# Patient Record
Sex: Female | Born: 1960
Health system: Southern US, Community
[De-identification: ages and names within clinical notes are randomized; demographics above are authoritative.]

## PROBLEM LIST (undated history)

## (undated) DIAGNOSIS — Z923 Personal history of irradiation: Secondary | ICD-10-CM

## (undated) DIAGNOSIS — K219 Gastro-esophageal reflux disease without esophagitis: Secondary | ICD-10-CM

## (undated) DIAGNOSIS — D649 Anemia, unspecified: Secondary | ICD-10-CM

## (undated) DIAGNOSIS — C801 Malignant (primary) neoplasm, unspecified: Secondary | ICD-10-CM

## (undated) DIAGNOSIS — Z9221 Personal history of antineoplastic chemotherapy: Secondary | ICD-10-CM

## (undated) DIAGNOSIS — F419 Anxiety disorder, unspecified: Secondary | ICD-10-CM

## (undated) DIAGNOSIS — I10 Essential (primary) hypertension: Secondary | ICD-10-CM

## (undated) DIAGNOSIS — C50919 Malignant neoplasm of unspecified site of unspecified female breast: Secondary | ICD-10-CM

## (undated) DIAGNOSIS — E119 Type 2 diabetes mellitus without complications: Secondary | ICD-10-CM

## (undated) HISTORY — PX: OTHER SURGICAL HISTORY: SHX169

---
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
## 2003-12-01 ENCOUNTER — Ambulatory Visit: Payer: Self-pay | Admitting: Internal Medicine

## 2004-04-09 ENCOUNTER — Ambulatory Visit: Payer: Self-pay | Admitting: Internal Medicine

## 2004-08-29 ENCOUNTER — Ambulatory Visit: Payer: Self-pay | Admitting: Internal Medicine

## 2004-09-03 ENCOUNTER — Ambulatory Visit: Payer: Self-pay | Admitting: Internal Medicine

## 2004-09-05 ENCOUNTER — Ambulatory Visit: Payer: Self-pay

## 2004-11-21 ENCOUNTER — Ambulatory Visit: Payer: Self-pay | Admitting: Internal Medicine

## 2006-10-10 ENCOUNTER — Inpatient Hospital Stay (HOSPITAL_COMMUNITY): Admission: AD | Admit: 2006-10-10 | Discharge: 2006-10-10 | Payer: Self-pay | Admitting: Obstetrics and Gynecology

## 2006-12-09 ENCOUNTER — Encounter (INDEPENDENT_AMBULATORY_CARE_PROVIDER_SITE_OTHER): Payer: Self-pay | Admitting: Obstetrics and Gynecology

## 2006-12-09 ENCOUNTER — Ambulatory Visit (HOSPITAL_COMMUNITY): Admission: RE | Admit: 2006-12-09 | Discharge: 2006-12-09 | Payer: Self-pay | Admitting: Obstetrics and Gynecology

## 2007-04-06 ENCOUNTER — Ambulatory Visit: Payer: Self-pay | Admitting: Internal Medicine

## 2007-04-06 ENCOUNTER — Encounter (INDEPENDENT_AMBULATORY_CARE_PROVIDER_SITE_OTHER): Payer: Self-pay | Admitting: *Deleted

## 2007-04-06 DIAGNOSIS — I10 Essential (primary) hypertension: Secondary | ICD-10-CM | POA: Insufficient documentation

## 2007-04-06 DIAGNOSIS — L910 Hypertrophic scar: Secondary | ICD-10-CM | POA: Insufficient documentation

## 2007-04-06 DIAGNOSIS — F411 Generalized anxiety disorder: Secondary | ICD-10-CM | POA: Insufficient documentation

## 2009-06-16 ENCOUNTER — Ambulatory Visit: Payer: Self-pay | Admitting: Internal Medicine

## 2009-06-16 ENCOUNTER — Encounter (INDEPENDENT_AMBULATORY_CARE_PROVIDER_SITE_OTHER): Payer: Self-pay | Admitting: *Deleted

## 2009-06-16 DIAGNOSIS — R51 Headache: Secondary | ICD-10-CM | POA: Insufficient documentation

## 2009-06-16 DIAGNOSIS — J019 Acute sinusitis, unspecified: Secondary | ICD-10-CM | POA: Insufficient documentation

## 2009-06-16 DIAGNOSIS — R519 Headache, unspecified: Secondary | ICD-10-CM | POA: Insufficient documentation

## 2009-08-03 ENCOUNTER — Emergency Department (HOSPITAL_COMMUNITY): Admission: EM | Admit: 2009-08-03 | Discharge: 2009-08-03 | Payer: Self-pay | Admitting: Emergency Medicine

## 2009-08-06 ENCOUNTER — Emergency Department (HOSPITAL_COMMUNITY): Admission: EM | Admit: 2009-08-06 | Discharge: 2009-08-06 | Payer: Self-pay | Admitting: Emergency Medicine

## 2010-02-13 NOTE — Letter (Signed)
Summary: Out of Work  [facility]  [address] [place], [place] [address]   Phone: [phone]  Fax: [phone]    June 16, 2009   Employee:  SHAMEKA AGGARWAL    To Whom It May Concern:   For Medical reasons, please excuse the above named employee from work for the following dates:  Start:   06/17/2009  End:   06/19/2009  Return to work 06/20/2009  If you need additional information, please feel free to contact our office.         Sincerely,    Dr. Oliver Barre

## 2010-02-13 NOTE — Assessment & Plan Note (Signed)
Summary: sore throat,difficulty swallowing/headache/ear pain/lb   Vital Signs:  Patient profile:   50 year old female Height:      61 inches Weight:      157.75 pounds BMI:     29.91 O2 Sat:      98 % on Room air Temp:     98.2 degrees F oral Pulse rate:   61 / minute BP sitting:   122 / 90  (left arm) Cuff size:   regular  Vitals Entered ByZella Ball Ewing (June 16, 2009 10:52 AM)  O2 Flow:  Room air CC: congestion, sore throat, ear pain, headache/RE   CC:  congestion, sore throat, ear pain, and headache/RE.  History of Present Illness: here with acute onset x 4 daysmild to mod headache, fever, ST, facial pain, pressure , and greenish congesiton, now with worsening prod cough as well today with greenish sputum, but Pt denies CP, sob, doe, wheezing, orthopnea, pnd, worsening LE edema, palps, dizziness or syncope   Hard to swallow secretions per pt,  got choked last PM, almost went to the ER,  Vick 44 and Dayquil not helping.    Problems Prior to Update: 1)  Headache  (ICD-784.0) 2)  Bronchitis-acute  (ICD-466.0) 3)  Sinusitis- Acute-nos  (ICD-461.9) 4)  Keloid  (ICD-701.4) 5)  Hypertension  (ICD-401.9) 6)  Anxiety  (ICD-300.00)  Medications Prior to Update: 1)  Azithromycin 250 Mg  Tabs (Azithromycin) .... 2po Qd For 1 Day, Then 1po Qd For 4days, Then Stop 2)  Tussionex Pennkinetic Er 8-10 Mg/46ml  Lqcr (Chlorpheniramine-Hydrocodone) .Marland Kitchen.. 1 Tsp By Mouth Two Times A Day Prn  Current Medications (verified): 1)  Cephalexin 500 Mg Caps (Cephalexin) .Marland Kitchen.. 1 Po Three Times A Day 2)  Hydrocodone-Homatropine 5-1.5 Mg/30ml Syrp (Hydrocodone-Homatropine) .Marland Kitchen.. 1 Tsp By Mouth Q 6 Hrs As Needed Cough 3)  Ibuprofen 400 Mg Tabs (Ibuprofen) .Marland Kitchen.. 1 - 2 By Mouth Q 6 Hrs As Needed  Allergies (verified): 1)  ! Atenolol  Past History:  Past Medical History: Last updated: 04/06/2007 migraine Anxiety Hypertension - diet controlled per pt  Past Surgical History: Last updated: 04/06/2007 nov  2008 - hysteroscopy c-section x 1  Social History: Last updated: 04/06/2007 Never Smoked Alcohol use-no single 2 children work - Investment banker, operational - [employer]  Risk Factors: Smoking Status: never (04/06/2007)  Review of Systems       all otherwise negative per pt -    Physical Exam  General:  alert and overweight-appearing.  , mild ill  Head:  normocephalic and atraumatic.   Eyes:  vision grossly intact, pupils equal, and pupils round.   Ears:  bilat tm's mild red, sinus tender bilat  Nose:  nasal dischargemucosal pallor and mucosal edema.   Mouth:  pharyngeal erythema and fair dentition.  , no exudate Neck:  supple and cervical lymphadenopathy.   Lungs:  normal respiratory effort and normal breath sounds., no wheeze or rales   Heart:  normal rate and regular rhythm.   Extremities:  no edema, no erythema  Neurologic:  alert & oriented X3, cranial nerves II-XII intact, strength normal in all extremities, and gait normal.     Impression & Recommendations:  Problem # 1:  BRONCHITIS-ACUTE (ICD-466.0)  Her updated medication list for this problem includes:    Cephalexin 500 Mg Caps (Cephalexin) .Marland Kitchen... 1 po three times a day    Hydrocodone-homatropine 5-1.5 Mg/63ml Syrp (Hydrocodone-homatropine) .Marland Kitchen... 1 tsp by mouth q 6 hrs as needed cough treat as above, f/u any  worsening signs or symptoms , also gave work note  Problem # 2:  SINUSITIS- ACUTE-NOS (ICD-461.9)  Her updated medication list for this problem includes:    Cephalexin 500 Mg Caps (Cephalexin) .Marland Kitchen... 1 po three times a day    Hydrocodone-homatropine 5-1.5 Mg/33ml Syrp (Hydrocodone-homatropine) .Marland Kitchen... 1 tsp by mouth q 6 hrs as needed cough treat as above, f/u any worsening signs or symptoms   Problem # 3:  HYPERTENSION (ICD-401.9)  BP today: 122/90 Prior BP: 147/77 (04/06/2007) stable overall by hx and exam, ok to continue meds/tx as is - does not need med at this time  Problem # 4:  HEADACHE (ICD-784.0)  Her  updated medication list for this problem includes:    Ibuprofen 400 Mg Tabs (Ibuprofen) .Marland Kitchen... 1 - 2 by mouth q 6 hrs as needed likely related to above - treat as above, f/u any worsening signs or symptoms , exam o/w benign  Complete Medication List: 1)  Cephalexin 500 Mg Caps (Cephalexin) .Marland Kitchen.. 1 po three times a day 2)  Hydrocodone-homatropine 5-1.5 Mg/14ml Syrp (Hydrocodone-homatropine) .Marland Kitchen.. 1 tsp by mouth q 6 hrs as needed cough 3)  Ibuprofen 400 Mg Tabs (Ibuprofen) .Marland Kitchen.. 1 - 2 by mouth q 6 hrs as needed  Patient Instructions: 1)  Please take all new medications as prescribed 2)  Continue all previous medications as before this visit  3)  you are given the work note today 4)  Please schedule a follow-up appointment in 3 months with CPX labs Prescriptions: IBUPROFEN 400 MG TABS (IBUPROFEN) 1 - 2 by mouth q 6 hrs as needed  #60 x 1   Entered and Authorized by:   Corwin Levins MD   Signed by:   Corwin Levins MD on 06/16/2009   Method used:   Print then Give to Patient   RxID:   [id number] HYDROCODONE-HOMATROPINE 5-1.5 MG/5ML SYRP (HYDROCODONE-HOMATROPINE) 1 tsp by mouth q 6 hrs as needed cough  #6oz x 1   Entered and Authorized by:   Corwin Levins MD   Signed by:   Corwin Levins MD on 06/16/2009   Method used:   Print then Give to Patient   RxID:   [id number] CEPHALEXIN 500 MG CAPS (CEPHALEXIN) 1 po three times a day  #30 x 0   Entered and Authorized by:   Corwin Levins MD   Signed by:   Corwin Levins MD on 06/16/2009   Method used:   Print then Give to Patient   RxID:   [id number]

## 2010-03-31 LAB — COMPREHENSIVE METABOLIC PANEL
ALT: 32 U/L (ref 0–35)
AST: 34 U/L (ref 0–37)
Alkaline Phosphatase: 98 U/L (ref 39–117)
Calcium: 9.1 mg/dL (ref 8.4–10.5)
Creatinine, Ser: 0.76 mg/dL (ref 0.4–1.2)
GFR calc non Af Amer: >60 mL/min (ref >60)
Sodium: 138 mEq/L (ref 135–145)
Total Bilirubin: 0.4 mg/dL (ref 0.3–1.2)

## 2010-03-31 LAB — CBC
HCT: 37.8 % (ref 36.0–46.0)
RBC: 5.11 MIL/uL (ref 3.87–5.11)

## 2010-03-31 LAB — URINALYSIS, ROUTINE W REFLEX MICROSCOPIC
Glucose, UA: NEGATIVE mg/dL
Hgb urine dipstick: NEGATIVE
Nitrite: NEGATIVE
Protein, ur: NEGATIVE mg/dL
Specific Gravity, Urine: 1.022 (ref 1.005–1.030)
pH: 5.5 (ref 5.0–8.0)

## 2010-03-31 LAB — DIFFERENTIAL
Basophils Absolute: 0.1 10*3/uL (ref 0.0–0.1)
Basophils Relative: 0 % (ref 0–1)
Eosinophils Absolute: 0 10*3/uL (ref 0.0–0.7)
Lymphocytes Relative: 13 % (ref 12–46)
Monocytes Absolute: 0.5 10*3/uL (ref 0.1–1.0)
Neutro Abs: 13.5 10*3/uL — ABNORMAL HIGH (ref 1.7–7.7)

## 2010-03-31 LAB — URINE CULTURE: Colony Count: 50000

## 2010-03-31 LAB — POCT PREGNANCY, URINE: Preg Test, Ur: NEGATIVE

## 2010-03-31 LAB — LIPASE, BLOOD: Lipase: 26 U/L (ref 11–59)

## 2010-03-31 LAB — URINE MICROSCOPIC-ADD ON

## 2010-05-29 NOTE — H&P (Signed)
[name], [name] NO.:  [id number]   MEDICAL RECORD NO.:  [id number]          PATIENT TYPE:  AMB   LOCATION:  SDC                           FACILITY:  [facility]   PHYSICIAN:  Juluis Mire, M.D.   DATE OF BIRTH:  [birth year]   DATE OF ADMISSION:  12/09/2006  DATE OF DISCHARGE:                              HISTORY & PHYSICAL   HISTORY OF PRESENT ILLNESS:  The patient is a 50 year old, gravida 3  para 2 abortus 1, female who presents for hysteroscopy.   The patient's cycles have become markedly regular and heavy with  associated anemia.  She was seen at [facility] Emergency Room where  the ultrasound confirmed multiple fibroids and a simple cyst in the left  ovary.  She subsequently underwent a saline infusion ultrasound inside  the office.  There was a large endometrial polyp for which she now  presents for resection.  It did have some cystic structures.  We could  not exclude some kind of endometrial pathology such as hyperplasia.   ALLERGIES:  NO KNOWN DRUG ALLERGIES.   MEDICATIONS:  Vitamins and iron.   PAST MEDICAL HISTORY:  Usual childhood diseases.  No significant  sequelae.   PAST SURGICAL HISTORY:  No previous surgical history.   OBSTETRICAL HISTORY:  She has had two vaginal deliveries.   SOCIAL HISTORY:  No tobacco or alcohol use.   FAMILY HISTORY:  Noncontributory.   REVIEW OF SYSTEMS:  Noncontributory.   PHYSICAL EXAMINATION:  VITAL SIGNS:  The patient is afebrile, stable  vital signs.  HEENT: The patient is normocephalic.  Pupils equal, round, and reactive  to light and accommodation.  Extraocular movements were intact.  Sclerae  and conjunctivae were clear.  Oropharynx clear.  NECK:  Without thyromegaly.  BREASTS:  Not examined.  LUNGS:  Clear.  CARDIOVASCULAR SYSTEM:  Regular rate.  No murmurs or gallops.  ABDOMEN:  Her abdominal exam is benign.  No mass, organomegaly, or  tenderness.  PELVIC:  Normal external genitalia.  Vaginal  mucosa is clear.  Cervix  unremarkable.  Uterus is 12 weeks in size, consistent with fibroids.  Adnexa unremarkable.  EXTREMITIES:  Trace edema.  NEUROLOGIC:  Grossly normal limits.   IMPRESSION:  1. Uterine fibroids.  2. Endometrial polyp.  3. Menorrhagia with anemia.   PLAN:  The patient will undergo hysteroscopy with D&C.  The risks have  been discussed including the risk of infection.  The risk of hemorrhage  could require transfusion with the risk of AIDS or hepatitis or possible  hysterectomy.  The risk of perforation that could lead to injury to  organs nearby such as bowel requiring further exploratory surgery.  The  risk of deep venous thrombosis and pulmonary embolus.  The patient  expressed understanding of the indications and risks.      Juluis Mire, M.D.  Electronically Signed     JSM/MEDQ  D:  12/09/2006  T:  12/09/2006  Job:  [id number]

## 2010-05-29 NOTE — Op Note (Signed)
NAMEMYLES, Erin Lawson NO.:  [id number]   MEDICAL RECORD NO.:  [id number]          PATIENT TYPE:  AMB   LOCATION:  SDC                           FACILITY:  [facility]   PHYSICIAN:  Juluis Mire, M.D.   DATE OF BIRTH:  1960/01/29   DATE OF PROCEDURE:  12/09/2006  DATE OF DISCHARGE:                               OPERATIVE REPORT   PREOPERATIVE DIAGNOSES:  1. Uterine fibroids, endometrial polyp, associated menorrhagia.  2. Lesion on the inner left thigh, probable lipoma.   POSTOPERATIVE DIAGNOSES:  1. Uterine fibroids, endometrial polyp, associated menorrhagia.  2. Lesion on the inner left thigh, probable lipoma.   PROCEDURE:  Hysteroscopy, resection of endometrium, endometrial  curettings, excision of lesion from left inner thigh.   SURGEON:  Juluis Mire, M.D.   ANESTHESIA:  General.   ESTIMATED BLOOD LOSS:  Minimal.   PACKS AND DRAINS:  None.   INTRAOPERATIVE BLOOD REPLACED:  None.   COMPLICATIONS:  None.   INDICATIONS:  Noted in the history and physical.   PROCEDURE:  The patient was taken to OR, placed in supine position.  After a satisfactory level of general anesthesia was obtained, the  patient was placed in dorsal lithotomy position using Allen stirrups.  Perineum, vagina were prepped out with Betadine and draped as a sterile  field. Speculum was placed in the vaginal vault.  The cervix grasped  with single-tooth tenaculum.  Uterus sounded to approximately 8 cm.  Cervix serially dilated to a size 35 Pratt dilator.  Operative  hysteroscope was introduced into uterine cavity which was distended  using sorbitol.  Visualization really did not reveal a polyp.  It may  been disrupted from the dilation.  There were fragments of tissue.  Multiple endometrial samplings were obtained along with curettings.  There was no signs of any type of perforation and deficit was minimal.  The hysteroscope was then removed.  Endometrial curettings were  obtained.  The  single toothed tenaculum and speculum were removed.   There was a lipoma on the inner aspect of the left thigh.  This was  infiltrated with Marcaine and resected and sent to pathology.  Skin was  reapproximated with 3-0 Rapide.  At this point in time the patient was  taken out of dorsal lithotomy position.  Once alert and extubated,  transferred to recovery room in good condition.  Sponge, instrument,  needle count was reported correct by circulating nurse.      Juluis Mire, M.D.  Electronically Signed     JSM/MEDQ  D:  12/09/2006  T:  12/09/2006  Job:  [id number]

## 2010-10-23 LAB — CBC
HCT: 35.3 — ABNORMAL LOW
MCHC: 32.9
Platelets: 408 — ABNORMAL HIGH
RDW: 14.3
WBC: 9.7

## 2010-10-25 LAB — WET PREP, GENITAL: Clue Cells Wet Prep HPF POC: NONE SEEN

## 2010-10-25 LAB — CBC
Hemoglobin: 10.9 — ABNORMAL LOW
RBC: 4.61
WBC: 9.3

## 2010-10-25 LAB — GC/CHLAMYDIA PROBE AMP, GENITAL: Chlamydia, DNA Probe: NEGATIVE

## 2012-05-17 ENCOUNTER — Observation Stay (HOSPITAL_COMMUNITY)
Admission: EM | Admit: 2012-05-17 | Discharge: 2012-05-18 | Disposition: A | Discharge status: Home / Self Care | Payer: BC Managed Care – PPO | Attending: Internal Medicine | Admitting: Internal Medicine

## 2012-05-17 ENCOUNTER — Emergency Department (HOSPITAL_COMMUNITY): Payer: BC Managed Care – PPO

## 2012-05-17 ENCOUNTER — Encounter (HOSPITAL_COMMUNITY): Payer: Self-pay | Admitting: *Deleted

## 2012-05-17 DIAGNOSIS — L910 Hypertrophic scar: Secondary | ICD-10-CM

## 2012-05-17 DIAGNOSIS — D649 Anemia, unspecified: Secondary | ICD-10-CM

## 2012-05-17 DIAGNOSIS — R51 Headache: Secondary | ICD-10-CM

## 2012-05-17 DIAGNOSIS — E119 Type 2 diabetes mellitus without complications: Secondary | ICD-10-CM

## 2012-05-17 DIAGNOSIS — R079 Chest pain, unspecified: Principal | ICD-10-CM

## 2012-05-17 DIAGNOSIS — I10 Essential (primary) hypertension: Secondary | ICD-10-CM

## 2012-05-17 DIAGNOSIS — E669 Obesity, unspecified: Secondary | ICD-10-CM | POA: Insufficient documentation

## 2012-05-17 DIAGNOSIS — D509 Iron deficiency anemia, unspecified: Secondary | ICD-10-CM | POA: Insufficient documentation

## 2012-05-17 DIAGNOSIS — J019 Acute sinusitis, unspecified: Secondary | ICD-10-CM

## 2012-05-17 DIAGNOSIS — F411 Generalized anxiety disorder: Secondary | ICD-10-CM

## 2012-05-17 DIAGNOSIS — R739 Hyperglycemia, unspecified: Secondary | ICD-10-CM

## 2012-05-17 HISTORY — DX: Type 2 diabetes mellitus without complications: E11.9

## 2012-05-17 HISTORY — DX: Essential (primary) hypertension: I10

## 2012-05-17 LAB — COMPREHENSIVE METABOLIC PANEL
Alkaline Phosphatase: 135 U/L — ABNORMAL HIGH (ref 39–117)
BUN: 19 mg/dL (ref 6–23)
Chloride: 101 mEq/L (ref 96–112)
Creatinine, Ser: 0.81 mg/dL (ref 0.50–1.10)
GFR calc Af Amer: >90 mL/min (ref >90)
Glucose, Bld: 144 mg/dL — ABNORMAL HIGH (ref 70–99)
Potassium: 3.8 mEq/L (ref 3.5–5.1)
Total Bilirubin: 0.2 mg/dL — ABNORMAL LOW (ref 0.3–1.2)

## 2012-05-17 LAB — CBC WITH DIFFERENTIAL/PLATELET
Eosinophils Absolute: 0.1 10*3/uL (ref 0.0–0.7)
HCT: 33.4 % — ABNORMAL LOW (ref 36.0–46.0)
Hemoglobin: 11 g/dL — ABNORMAL LOW (ref 12.0–15.0)
Lymphs Abs: 5.6 10*3/uL — ABNORMAL HIGH (ref 0.7–4.0)
MCH: 23.3 pg — ABNORMAL LOW (ref 26.0–34.0)
Monocytes Absolute: 0.8 10*3/uL (ref 0.1–1.0)
Monocytes Relative: 6 % (ref 3–12)
Neutro Abs: 6.5 10*3/uL (ref 1.7–7.7)
Neutrophils Relative %: 50 % (ref 43–77)
RBC: 4.73 MIL/uL (ref 3.87–5.11)

## 2012-05-17 MED ORDER — NITROGLYCERIN 0.4 MG SL SUBL
0.4000 mg | SUBLINGUAL_TABLET | SUBLINGUAL | Status: DC | PRN
  Administered 2012-05-17: 0.4 mg via SUBLINGUAL
  Filled 2012-05-17: qty 25

## 2012-05-17 MED ORDER — ASPIRIN 325 MG PO TABS
325.0000 mg | ORAL_TABLET | ORAL | Status: AC
  Administered 2012-05-17: 325 mg via ORAL
  Filled 2012-05-17: qty 1

## 2012-05-17 NOTE — ED Notes (Signed)
C/o recent intermittant CP. Seen at [facility] PCP today, was called at home and informed that her cardiac enzymes were elevated and that she may be having a heart attack. Pt alert, NAD, calm, interactive, resps e/u, speaking in clear complete sentences. Reports L shoulder pain, rates 6/10 but calls it "a little".

## 2012-05-17 NOTE — ED Notes (Signed)
The pt is alert no pain or distress at present

## 2012-05-17 NOTE — ED Notes (Addendum)
Pt began having left CP that radiates to left shoulder, and left side of neck that started yesterday she went to [facility] and had blood work and an EKG done. Pt stated she was told her EKG was abnormal, her x-ray was ok, they thought she may have had a muscle spasm and sent home, around 9:30pm pt was called by Dr. Isidore Moos and told that her cardiac enzymes were elevated and that she needed to go to ED. Pt states she had some n/v/d and was unable to hold anything down.Pt denies any sob at this time. Pt rates her pain 6/10.

## 2012-05-17 NOTE — ED Provider Notes (Signed)
History     CSN: [id number]  Arrival date & time 05/17/12  2232   First MD Initiated Contact with Patient 05/17/12 2305      Chief Complaint  Patient presents with  . Chest Pain  . Shortness of Breath  . Emesis  . Diarrhea   HPI Erin Lawson is a 52 y.o. female presenting with a 6/10, pulling sensation the left side of the chest it started earlier today.  Patient had been feeling well yesterday, she has nausea and vomiting was sent home from work associated with some diarrhea, she does her stomach. She went to [facility] which was close to her work, they took an EKG which they said was abnormal and she was later called and told she had an elevated troponin and needed to go to the emergency department.  Patient has no prior history of coronary artery disease, has history of hypertension, no first degree family relatives have a history of coronary artery disease.  Patient did get some relief with a single nitroglycerin here in the ER.   Past Medical History  Diagnosis Date  . Hypertension     Past Surgical History  Procedure Laterality Date  . Cesarean section      No family history on file.  History  Substance Use Topics  . Smoking status: Never Smoker   . Smokeless tobacco: Not on file  . Alcohol Use: No    OB History   Grav Para Term Preterm Abortions TAB SAB Ect Mult Living                  Review of Systems At least 10pt or greater review of systems completed and are negative except where specified in the HPI.  Allergies  Atenolol  Home Medications  No current outpatient prescriptions on file.  BP 175/86  Temp(Src) 98 F (36.7 C) (Oral)  Resp 15  SpO2 99%  Physical Exam  Nursing notes reviewed.  Electronic medical record reviewed. VITAL SIGNS:   Filed Vitals:   05/17/12 2244  BP: 175/86  Temp: 98 F (36.7 C)  TempSrc: Oral  Resp: 15  SpO2: 99%   CONSTITUTIONAL: Awake, oriented, appears non-toxic HENT: Atraumatic, normocephalic,  oral mucosa pink and moist, airway patent. Nares patent without drainage. External ears normal. EYES: Conjunctiva clear, EOMI, PERRLA NECK: Trachea midline, non-tender, supple CARDIOVASCULAR: Normal heart rate, Normal rhythm, No murmurs, rubs, gallops PULMONARY/CHEST: Clear to auscultation, no rhonchi, wheezes, or rales. Symmetrical breath sounds. Non-tender. ABDOMINAL: Non-distended, soft, non-tender - no rebound or guarding.  BS normal. NEUROLOGIC: Non-focal, moving all four extremities, no gross sensory or motor deficits. EXTREMITIES: No clubbing, cyanosis, or edema SKIN: Warm, Dry, No erythema, No rash  ED Course  Procedures (including critical care time)  Date: 05/17/2012  Rate: 64  Rhythm: normal sinus rhythm  QRS Axis: normal  Intervals: normal  ST/T Wave abnormalities:  T wave inversions in the lateral leads 1, aVL, V5 and V6  Conduction Disutrbances: none  Narrative Interpretation: ischemic changes in lateral leads - no prior EKGs available for comparison.   Labs Reviewed  CBC WITH DIFFERENTIAL - Abnormal; Notable for the following:    WBC 13.0 (*)    Hemoglobin 11.0 (*)    HCT 33.4 (*)    MCV 70.6 (*)    MCH 23.3 (*)    Lymphs Abs 5.6 (*)    All other components within normal limits  COMPREHENSIVE METABOLIC PANEL - Abnormal; Notable for the following:  Glucose, Bld 144 (*)    Alkaline Phosphatase 135 (*)    Total Bilirubin 0.2 (*)    GFR calc non Af Amer 82 (*)    All other components within normal limits  CBC - Abnormal; Notable for the following:    WBC 10.9 (*)    Hemoglobin 10.8 (*)    HCT 33.6 (*)    MCV 70.4 (*)    MCH 22.6 (*)    All other components within normal limits  TROPONIN I  BASIC METABOLIC PANEL  POCT I-STAT TROPONIN I   Dg Chest Portable 1 View  05/17/2012  *RADIOLOGY REPORT*  Clinical Data: 52 year old female chest pain shortness of breath. Vomiting diarrhea.  PORTABLE CHEST - 1 VIEW  Comparison: [facility] chest radiographs 1200  hours the same day.  Findings: AP portable semi upright view 2350 hours.  Lower lung volumes.  The lungs remain clear.  Cardiac size and mediastinal contours are within normal limits.  IMPRESSION: Lower lung volumes.  No acute cardiopulmonary abnormality.   Original Report Authenticated By: Erskine Speed, M.D.      1. Chest pain   2. Unspecified essential hypertension     MDM  Erin Lawson is a 52 y.o. female who had some symptoms suggestive of acute gastroenteritis over the course the last few days but was referred to the emergency department by urgent care with concerns for acute coronary syndrome. Patient's did not have normal EKG at the time, she does have inverted T waves in the lateral leads, seen on prior EKG 08/06/2009.  Patient has not had a workup coronary syndrome, she has not had a stress test or provocative testing, no cardiologist. Initial troponin is negative, admit patient for acute coronary syndrome rule out.  Doubt PE, no evidence for pneumothorax or pneumonia.        Jones Skene, MD 05/18/12 0700

## 2012-05-17 NOTE — ED Notes (Signed)
Dr. Bonk at bedside 

## 2012-05-18 ENCOUNTER — Encounter (HOSPITAL_COMMUNITY): Payer: Self-pay | Admitting: Internal Medicine

## 2012-05-18 DIAGNOSIS — E119 Type 2 diabetes mellitus without complications: Secondary | ICD-10-CM

## 2012-05-18 DIAGNOSIS — I10 Essential (primary) hypertension: Secondary | ICD-10-CM

## 2012-05-18 DIAGNOSIS — R079 Chest pain, unspecified: Secondary | ICD-10-CM

## 2012-05-18 DIAGNOSIS — D649 Anemia, unspecified: Secondary | ICD-10-CM | POA: Diagnosis present

## 2012-05-18 DIAGNOSIS — R739 Hyperglycemia, unspecified: Secondary | ICD-10-CM | POA: Diagnosis present

## 2012-05-18 HISTORY — DX: Type 2 diabetes mellitus without complications: E11.9

## 2012-05-18 HISTORY — DX: Chest pain, unspecified: R07.9

## 2012-05-18 LAB — TROPONIN I: Troponin I: <0.3 ng/mL (ref <0.30)

## 2012-05-18 LAB — BASIC METABOLIC PANEL
Calcium: 9 mg/dL (ref 8.4–10.5)
Creatinine, Ser: 0.74 mg/dL (ref 0.50–1.10)
GFR calc Af Amer: >90 mL/min (ref >90)
GFR calc non Af Amer: >90 mL/min (ref >90)

## 2012-05-18 LAB — CBC
MCH: 22.6 pg — ABNORMAL LOW (ref 26.0–34.0)
MCHC: 32.1 g/dL (ref 30.0–36.0)
MCV: 70.4 fL — ABNORMAL LOW (ref 78.0–100.0)
Platelets: 329 10*3/uL (ref 150–400)
RDW: 14 % (ref 11.5–15.5)

## 2012-05-18 LAB — TSH: TSH: 2.664 u[IU]/mL (ref 0.350–4.500)

## 2012-05-18 LAB — POCT I-STAT TROPONIN I: Troponin i, poc: 0 ng/mL (ref 0.00–0.08)

## 2012-05-18 MED ORDER — ACETAMINOPHEN 325 MG PO TABS
650.0000 mg | ORAL_TABLET | Freq: Four times a day (QID) | ORAL | Status: DC | PRN
  Administered 2012-05-18: 650 mg via ORAL
  Filled 2012-05-18: qty 2

## 2012-05-18 MED ORDER — IBUPROFEN 600 MG PO TABS
600.0000 mg | ORAL_TABLET | Freq: Four times a day (QID) | ORAL | Status: DC | PRN
  Filled 2012-05-18: qty 1

## 2012-05-18 MED ORDER — LOSARTAN POTASSIUM 25 MG PO TABS: 25.0000 mg | ORAL_TABLET | Freq: Every day | ORAL | Status: DC

## 2012-05-18 MED ORDER — OXYCODONE-ACETAMINOPHEN 5-325 MG PO TABS: 1.0000 | ORAL_TABLET | ORAL | Status: DC | PRN

## 2012-05-18 MED ORDER — METFORMIN HCL 500 MG PO TABS
500.0000 mg | ORAL_TABLET | Freq: Every day | ORAL | Status: DC
  Administered 2012-05-18: 500 mg via ORAL
  Filled 2012-05-18: qty 1

## 2012-05-18 MED ORDER — ONDANSETRON HCL 4 MG PO TABS: 4.0000 mg | ORAL_TABLET | ORAL | Status: DC | PRN

## 2012-05-18 MED ORDER — PANTOPRAZOLE SODIUM 20 MG PO TBEC: 20.0000 mg | DELAYED_RELEASE_TABLET | Freq: Every day | ORAL | Status: DC

## 2012-05-18 MED ORDER — METFORMIN HCL 500 MG PO TABS: 500.0000 mg | ORAL_TABLET | Freq: Every day | ORAL | Status: DC

## 2012-05-18 MED ORDER — SODIUM CHLORIDE 0.9 % IJ SOLN
3.0000 mL | Freq: Two times a day (BID) | INTRAMUSCULAR | Status: DC
  Administered 2012-05-18: 3 mL via INTRAVENOUS

## 2012-05-18 MED ORDER — IBUPROFEN 600 MG PO TABS
600.0000 mg | ORAL_TABLET | Freq: Once | ORAL | Status: AC
  Administered 2012-05-18: 600 mg via ORAL
  Filled 2012-05-18: qty 1

## 2012-05-18 MED ORDER — OXYCODONE HCL 5 MG PO TABS: 5.0000 mg | ORAL_TABLET | ORAL | Status: DC | PRN

## 2012-05-18 MED ORDER — IBUPROFEN 200 MG PO TABS: 400.0000 mg | ORAL_TABLET | Freq: Four times a day (QID) | ORAL | Status: DC | PRN

## 2012-05-18 MED ORDER — ONDANSETRON HCL 4 MG/2ML IJ SOLN
4.0000 mg | INTRAMUSCULAR | Status: DC | PRN
  Filled 2012-05-18: qty 2

## 2012-05-18 MED ORDER — ASPIRIN EC 81 MG PO TBEC
81.0000 mg | DELAYED_RELEASE_TABLET | Freq: Every day | ORAL | Status: DC
  Administered 2012-05-18: 81 mg via ORAL
  Filled 2012-05-18: qty 1

## 2012-05-18 MED ORDER — LIVING WELL WITH DIABETES BOOK: Freq: Once | Status: DC

## 2012-05-18 MED ORDER — LOSARTAN POTASSIUM 25 MG PO TABS
25.0000 mg | ORAL_TABLET | Freq: Every day | ORAL | Status: DC
  Administered 2012-05-18: 25 mg via ORAL
  Filled 2012-05-18: qty 1

## 2012-05-18 MED ORDER — PANTOPRAZOLE SODIUM 40 MG PO TBEC
40.0000 mg | DELAYED_RELEASE_TABLET | Freq: Every day | ORAL | Status: DC
  Administered 2012-05-18: 40 mg via ORAL
  Filled 2012-05-18: qty 1

## 2012-05-18 MED ORDER — FAMOTIDINE 10 MG PO CHEW: 20.0000 mg | CHEWABLE_TABLET | Freq: Every day | ORAL | Status: DC

## 2012-05-18 NOTE — Progress Notes (Signed)
Utilization review completed.  

## 2012-05-18 NOTE — H&P (Signed)
[facility] History and Physical  MABELLE MUNGIN GUY:[id number] DOB: [birth year] DOA: 05/17/2012  Referring physician: ED PCP: [facility]  Specialists: None  Chief Complaint: Chest pain  HPI: FIDELIS LOTH is a 52 y.o. female who presents with c/o intermittent CP for past 24 hours.  Seen at [facility] earlier today, initially discharged home after it was felt that (and patient still thinks that) her CP was musculoskeletal, called at home and informed that her "cardiac enzyme test had come back positive" and told she needed to get into the ED.  Patient reports that her CP is exacerbated by moving her LUE shoulder around, better when she dosent move it.  She dosent have any SOB, no DOE, no worsening of CP with activity.  Her only cardiac risk factor is untreated HTN.  In the ED trops were negative, EKG unchanged from 2011, hospitalist asked to admit.  Review of Systems: 12 systems reviewed and negative.  Past Medical History  Diagnosis Date  . Hypertension    Past Surgical History  Procedure Laterality Date  . Cesarean section     Social History:  reports that she has never smoked. She does not have any smokeless tobacco history on file. She reports that she does not drink alcohol or use illicit drugs.   Allergies  Allergen Reactions  . Atenolol     REACTION: made me feel bad  . Pork-Derived Products Other (See Comments)    Migraine     Family History  Problem Relation Age of Onset  . Heart attack Neg Hx     Prior to Admission medications   Medication Sig Start Date End Date Taking? Authorizing Provider  aspirin EC 81 MG tablet Take 81 mg by mouth daily.   Yes Historical Provider, MD  Iron-Vitamins (GERITOL) LIQD Take by mouth daily. 1 capful daily   Yes Historical Provider, MD   Physical Exam: Filed Vitals:   05/17/12 2244 05/17/12 2330  BP: 175/86 163/88  Pulse:  60  Temp: 98 F (36.7 C)   TempSrc: Oral   Resp: 15 20  SpO2: 99% 100%     General:  NAD, resting comfortably in bed Eyes: PEERLA EOMI ENT: mucous membranes moist Neck: supple w/o JVD Cardiovascular: RRR w/o MRG Respiratory: CTA B Abdomen: soft, nt, nd, bs+ Skin: no rash nor lesion Musculoskeletal: MAE, full ROM all 4 extremities Psychiatric: normal tone and affect Neurologic: AAOx3, grossly non-focal  Labs on Admission:  Basic Metabolic Panel:  Recent Labs Lab 05/17/12 2259  NA 138  K 3.8  CL 101  CO2 27  GLUCOSE 144*  BUN 19  CREATININE 0.81  CALCIUM 9.5   Liver Function Tests:  Recent Labs Lab 05/17/12 2259  AST 21  ALT 23  ALKPHOS 135*  BILITOT 0.2*  PROT 7.3  ALBUMIN 4.1   No results found for this basename: LIPASE, AMYLASE,  in the last 168 hours No results found for this basename: AMMONIA,  in the last 168 hours CBC:  Recent Labs Lab 05/17/12 2259  WBC 13.0*  NEUTROABS 6.5  HGB 11.0*  HCT 33.4*  MCV 70.6*  PLT 380   Cardiac Enzymes: No results found for this basename: CKTOTAL, CKMB, CKMBINDEX, TROPONINI,  in the last 168 hours  BNP (last 3 results) No results found for this basename: PROBNP,  in the last 8760 hours CBG: No results found for this basename: GLUCAP,  in the last 168 hours  Radiological Exams on Admission: Dg Chest Portable 1  View  05/17/2012  *RADIOLOGY REPORT*  Clinical Data: 52 year old female chest pain shortness of breath. Vomiting diarrhea.  PORTABLE CHEST - 1 VIEW  Comparison: [facility] chest radiographs 1200 hours the same day.  Findings: AP portable semi upright view 2350 hours.  Lower lung volumes.  The lungs remain clear.  Cardiac size and mediastinal contours are within normal limits.  IMPRESSION: Lower lung volumes.  No acute cardiopulmonary abnormality.   Original Report Authenticated By: Erskine Speed, M.D.     EKG: Independently reviewed. NSR, has T wave inversions in the lateral leads but these appear unchanged from EKG in July of 2011.  Assessment/Plan Principal  Problem:   Chest pain Active Problems:   HYPERTENSION   1. Chest pain - Despite the history of positive enzyme level at UC, our levels drawn here are negative thus far, history of disease makes it sound much more like a pulled muscle than cardiac pain, never the less will admit patient for observation, serial troponins, and keep patient on Tele in case this does indeed represent a real event as we do not have any records from the UC. 2. HTN - on no meds, SBP in the 160s, will defer management at this time to patients PCP.   Code Status: Full Code (must indicate code status--if unknown or must be presumed, indicate so) Family Communication: No family in room (indicate person spoken with, if applicable, with phone number if by telephone) Disposition Plan: Admit to obs (indicate anticipated LOS)  Time spent: 50 min  Santoria Chason M. [facility] Pager [phone]  If 7PM-7AM, please contact night-coverage [email or web address] Password [email or web address] 05/18/2012, 1:35 AM

## 2012-05-18 NOTE — Progress Notes (Signed)
Inpatient Diabetes Program Recommendations  AACE/ADA: New Consensus Statement on Inpatient Glycemic Control (2013)  Target Ranges:  Prepandial:   less than 140 mg/dL      Peak postprandial:   less than 180 mg/dL (1-2 hours)      Critically ill patients:  140 - 180 mg/dL   Reason for Visit: Newly-diagnosed DM  Briefly met with pt regarding HgbA1C of 6.8%.  Has family hx DM, and no polyuria, polydipsia PTA.  Drinks regular sodas and sweet tea, no exercise, and has been stressed recently with being in school and working 2 jobs.  Discussed DM, diet and portion control, and importance of taking meds and f/u with PCP. Pt willing to go to OP Diabetes Education at [facility].  Will order.  Explained how lifestyle changes, losing a small amt of weight, and walking on a regular basis will assist with blood sugar control.  Pt voiced understanding and seems motivated to make these changes.  Will order Living Well With Diabetes book and encouraged pt to view diabetes videos on pt ed channel.   Discussed above with RN.  Thank you. Ailene Ards, RD, LDN, CDE Inpatient Diabetes Coordinator [phone]

## 2012-05-18 NOTE — ED Notes (Signed)
No pain pt c/o hunger and sl nausea also

## 2012-05-18 NOTE — ED Notes (Signed)
Report called to 3000 

## 2012-05-18 NOTE — Discharge Summary (Signed)
Physician Discharge Summary  Erin Lawson EXB:[id number] DOB: 06/17/1960 DOA: 05/17/2012  PCP: Erin Lawson  Admit date: 05/17/2012 Discharge date: 05/18/2012  Time spent: Greater than 30 minutes  Recommendations for Outpatient Follow-up:  1. The patient was advised to followup with her primary care physician for further management of her hypertension and diabetes.  Discharge Diagnoses:   1. Chest pain, likely musculoskeletal in origin. Myocardial infarction ruled out. 2. Newly diagnosed type 2 diabetes mellitus. Hemoglobin A1c was 6.8. 3. Hypertension. 4. Microcytic anemia. 5. Obesity.  Discharge Condition: Improved.  Diet recommendation: Carbohydrate modified/heart healthy.  Filed Weights   05/18/12 0230  Weight: 72.167 kg (159 lb 1.6 oz)    History of present illness:  The patient is a 52 year old woman with a history significant for hypertension, who presented to the emergency department on 05/17/2012 with a chief complaint of chest pain. She was seen at a local urgent care for chest pain. 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 However, she was called back by the urgent care and told that her "cardiac enzyme test had come back positive". She was advised to come to the emergency department. When she was initially evaluated, she reported that her chest pain was exaggerated by moving her left shoulder. In the emergency department, she was afebrile, mildly bradycardic, and hypertensive. Her EKG revealed normal sinus rhythm with a heart rate of 64 beats per minute and no concerning ST or T wave abnormalities. Her chest x-ray revealed no acute abnormalities. Her troponin I. point-of-care marker was 0. Her venous glucose was 144. She was admitted for further evaluation and management.  Hospital Course:  The patient was started on as needed opiate analgesics and ibuprofen. Supportive treatment was  given otherwise. She was continued on losartan for treatment of her hypertension. 4 or further evaluation, a number of studies were ordered. Her troponin I was negative x4. Her TSH was within normal limits at 2.6. Her hemoglobin A1c was elevated at 6.8. She was informed that she had the clinical criteria for type 2 diabetes mellitus. She does have a positive family history of diabetes. She was informed that her diabetes was likely early, and therefore, she would be able to manage it very well if she would be compliant with her diet, exercise, and medication management. She was started on metformin 500 mg daily. The diabetes coordinator was consulted. The patient received diabetes education. As a CNA, she was already educated on self glucose monitoring.  The patient remained afebrile and hemodynamically stable. Her blood pressure improved. She ruled out for myocardial infarction. She was discharged on a short course of Percocet and as needed ibuprofen. However, she was instructed not to take ibuprofen every day as it may affect her blood pressure and diabetes control. She voiced understanding.  Procedures:  None  Consultations: None     Discharge Exam: Filed Vitals:   05/18/12 0159 05/18/12 0230 05/18/12 0552 05/18/12 1400  BP:  172/74 154/78 156/73  Pulse:  57 62 84  Temp: 97.8 F (36.6 C) 97.9 F (36.6 C) 98.4 F (36.9 C) 98.6 F (37 C)  TempSrc:    Oral  Resp: 18 16 18 17   Height:  5\' 1"  (1.549 m)    Weight:  72.167 kg (159 lb 1.6 oz)    SpO2:  95% 100% 100%    General: pleasant obese 52 year old African-American woman in no acute distress.  Cardiovascular:  S1, S2, with no murmurs rubs gallops.  Respiratory: clear to auscultation bilaterally. Chest wall: Mildly tender over the left pectoralis muscle with no edema or erythema. Mildly tender over the left rotator cuff with no warmth or edema.   Discharge Instructions  Discharge Orders   Future Orders Complete By Expires      Ambulatory referral to Nutrition and Diabetic Education  As directed     Comments:      hgbA1C - 6.8%    Diet - low sodium heart healthy  As directed     Diet - low sodium heart healthy  As directed     Diet Carb Modified  As directed     Discharge instructions  As directed     Comments:      Followup with your primary care physician in one week or less.    Increase activity slowly  As directed     Increase activity slowly  As directed         Medication List    TAKE these medications       aspirin EC 81 MG tablet  Take 81 mg by mouth daily.     famotidine 10 MG chewable tablet  Commonly known as:  PEPCID AC  Chew 2 tablets (20 mg total) by mouth daily.     Geritol Liqd  Take by mouth daily. 1 capful daily     ibuprofen 200 MG tablet  Commonly known as:  ADVIL  Take 2 tablets (400 mg total) by mouth every 6 (six) hours as needed for pain.     losartan 25 MG tablet  Commonly known as:  COZAAR  Take 1 tablet (25 mg total) by mouth daily.     metFORMIN 500 MG tablet  Commonly known as:  GLUCOPHAGE  Take 1 tablet (500 mg total) by mouth daily with supper.     oxyCODONE-acetaminophen 5-325 MG per tablet  Commonly known as:  ROXICET  Take 1 tablet by mouth every 4 (four) hours as needed for pain.       Allergies  Allergen Reactions  . Atenolol     REACTION: made me feel bad  . Pork-Derived Products Other (See Comments)    Migraine        Follow-up Information   Please follow up. (Follow up with your doctor at [facility].)        The results of significant diagnostics from this hospitalization (including imaging, microbiology, ancillary and laboratory) are listed below for reference.    Significant Diagnostic Studies: Dg Chest Portable 1 View  05/17/2012  *RADIOLOGY REPORT*  Clinical Data: 52 year old female chest pain shortness of breath. Vomiting diarrhea.  PORTABLE CHEST - 1 VIEW  Comparison: [facility] chest radiographs 1200 hours the same day.   Findings: AP portable semi upright view 2350 hours.  Lower lung volumes.  The lungs remain clear.  Cardiac size and mediastinal contours are within normal limits.  IMPRESSION: Lower lung volumes.  No acute cardiopulmonary abnormality.   Original Report Authenticated By: Erskine Speed, M.D.     Microbiology: No results found for this or any previous visit (from the past 240 hour(s)).   Labs: Basic Metabolic Panel:  Recent Labs Lab 05/17/12 2259 05/18/12 0614  NA 138 140  K 3.8 3.7  CL 101 105  CO2 27 27  GLUCOSE 144* 132*  BUN 19 16  CREATININE 0.81 0.74  CALCIUM 9.5 9.0   Liver Function Tests:  Recent Labs Lab 05/17/12 2259  AST 21  ALT 23  ALKPHOS 135*  BILITOT 0.2*  PROT 7.3  ALBUMIN 4.1   No results found for this basename: LIPASE, AMYLASE,  in the last 168 hours No results found for this basename: AMMONIA,  in the last 168 hours CBC:  Recent Labs Lab 05/17/12 2259 05/18/12 0614  WBC 13.0* 10.9*  NEUTROABS 6.5  --   HGB 11.0* 10.8*  HCT 33.4* 33.6*  MCV 70.6* 70.4*  PLT 380 329   Cardiac Enzymes:  Recent Labs Lab 05/18/12 0153 05/18/12 0903 05/18/12 1318  TROPONINI <0.30 <0.30 <0.30   BNP: BNP (last 3 results) No results found for this basename: PROBNP,  in the last 8760 hours CBG: No results found for this basename: GLUCAP,  in the last 168 hours     Signed:  Destini Cambre  [facility] 05/18/2012, 9:47 PM

## 2012-05-18 NOTE — ED Notes (Signed)
Aspirin ordered in the   Ed.  Aspirin already given 325mg  on arrival ot the ed.  81 mg aspirin not given

## 2012-07-10 ENCOUNTER — Ambulatory Visit: Payer: BC Managed Care – PPO | Admitting: *Deleted

## 2013-10-01 ENCOUNTER — Emergency Department (HOSPITAL_COMMUNITY)
Admission: EM | Admit: 2013-10-01 | Discharge: 2013-10-02 | Disposition: A | Discharge status: Home / Self Care | Payer: Self-pay | Attending: Emergency Medicine | Admitting: Emergency Medicine

## 2013-10-01 ENCOUNTER — Encounter (HOSPITAL_COMMUNITY): Payer: Self-pay | Admitting: Emergency Medicine

## 2013-10-01 DIAGNOSIS — R109 Unspecified abdominal pain: Secondary | ICD-10-CM | POA: Insufficient documentation

## 2013-10-01 DIAGNOSIS — R1013 Epigastric pain: Secondary | ICD-10-CM | POA: Insufficient documentation

## 2013-10-01 DIAGNOSIS — E119 Type 2 diabetes mellitus without complications: Secondary | ICD-10-CM | POA: Insufficient documentation

## 2013-10-01 DIAGNOSIS — Z7982 Long term (current) use of aspirin: Secondary | ICD-10-CM | POA: Insufficient documentation

## 2013-10-01 DIAGNOSIS — R1012 Left upper quadrant pain: Secondary | ICD-10-CM | POA: Insufficient documentation

## 2013-10-01 DIAGNOSIS — R1110 Vomiting, unspecified: Secondary | ICD-10-CM | POA: Insufficient documentation

## 2013-10-01 DIAGNOSIS — D259 Leiomyoma of uterus, unspecified: Secondary | ICD-10-CM | POA: Insufficient documentation

## 2013-10-01 DIAGNOSIS — Z9889 Other specified postprocedural states: Secondary | ICD-10-CM | POA: Insufficient documentation

## 2013-10-01 DIAGNOSIS — Z79899 Other long term (current) drug therapy: Secondary | ICD-10-CM | POA: Insufficient documentation

## 2013-10-01 DIAGNOSIS — R1011 Right upper quadrant pain: Secondary | ICD-10-CM | POA: Insufficient documentation

## 2013-10-01 DIAGNOSIS — I10 Essential (primary) hypertension: Secondary | ICD-10-CM | POA: Insufficient documentation

## 2013-10-01 DIAGNOSIS — E860 Dehydration: Secondary | ICD-10-CM | POA: Insufficient documentation

## 2013-10-01 LAB — COMPREHENSIVE METABOLIC PANEL
ALT: 21 U/L (ref 0–35)
ANION GAP: 18 — AB (ref 5–15)
AST: 22 U/L (ref 0–37)
Albumin: 4.7 g/dL (ref 3.5–5.2)
Alkaline Phosphatase: 144 U/L — ABNORMAL HIGH (ref 39–117)
BUN: 13 mg/dL (ref 6–23)
CALCIUM: 10.1 mg/dL (ref 8.4–10.5)
CO2: 22 meq/L (ref 19–32)
CREATININE: 0.58 mg/dL (ref 0.50–1.10)
Chloride: 97 mEq/L (ref 96–112)
GLUCOSE: 189 mg/dL — AB (ref 70–99)
Potassium: 4.2 mEq/L (ref 3.7–5.3)
Sodium: 137 mEq/L (ref 137–147)
TOTAL PROTEIN: 8.6 g/dL — AB (ref 6.0–8.3)
Total Bilirubin: 0.6 mg/dL (ref 0.3–1.2)

## 2013-10-01 LAB — CBC WITH DIFFERENTIAL/PLATELET
Basophils Absolute: 0 10*3/uL (ref 0.0–0.1)
Basophils Relative: 0 % (ref 0–1)
EOS ABS: 0 10*3/uL (ref 0.0–0.7)
EOS PCT: 0 % (ref 0–5)
HEMATOCRIT: 38.3 % (ref 36.0–46.0)
Hemoglobin: 12.4 g/dL (ref 12.0–15.0)
LYMPHS ABS: 2.6 10*3/uL (ref 0.7–4.0)
LYMPHS PCT: 18 % (ref 12–46)
MCH: 22.8 pg — AB (ref 26.0–34.0)
MCHC: 32.4 g/dL (ref 30.0–36.0)
MCV: 70.5 fL — AB (ref 78.0–100.0)
MONO ABS: 0.3 10*3/uL (ref 0.1–1.0)
MONOS PCT: 2 % — AB (ref 3–12)
Neutro Abs: 11.6 10*3/uL — ABNORMAL HIGH (ref 1.7–7.7)
Neutrophils Relative %: 81 % — ABNORMAL HIGH (ref 43–77)
PLATELETS: 413 10*3/uL — AB (ref 150–400)
RBC: 5.43 MIL/uL — AB (ref 3.87–5.11)
RDW: 14 % (ref 11.5–15.5)
WBC: 14.4 10*3/uL — AB (ref 4.0–10.5)

## 2013-10-01 LAB — LIPASE, BLOOD: Lipase: 18 U/L (ref 11–59)

## 2013-10-01 MED ORDER — FENTANYL CITRATE 0.05 MG/ML IJ SOLN
100.0000 ug | Freq: Once | INTRAMUSCULAR | Status: AC
  Administered 2013-10-01: 100 ug via INTRAVENOUS
  Filled 2013-10-01: qty 2

## 2013-10-01 MED ORDER — ONDANSETRON HCL 4 MG/2ML IJ SOLN
4.0000 mg | Freq: Once | INTRAMUSCULAR | Status: AC
  Administered 2013-10-01: 4 mg via INTRAVENOUS
  Filled 2013-10-01: qty 2

## 2013-10-01 NOTE — ED Provider Notes (Signed)
CSN: [id number]     Arrival date & time 10/01/13  2000 History   First MD Initiated Contact with Patient 10/01/13 2304     Chief Complaint  Patient presents with  . Abdominal Pain      Patient is a 53 y.o. female presenting with abdominal pain. The history is provided by the patient.  Abdominal Pain Pain location:  RUQ and LUQ Pain severity:  Moderate Onset quality:  Gradual Duration:  12 hours Timing:  Constant Progression:  Worsening Chronicity:  New Relieved by:  Nothing Worsened by:  Nothing tried Associated symptoms: no chest pain, no cough, no diarrhea, no dysuria, no fever, no melena and no shortness of breath   Pt reports she had chinese food last night, and she woke up this morning with upper abdomimal pain and multiple episodes of nonbloody vomiting.  No diarrhea/rectal bleeding.  She is passing small bowel movements No CP She reports abd pain will radiate into back at times as well. She reports she has not had this type of pain previously  Past Medical History  Diagnosis Date  . Hypertension   . DM type 2 (diabetes mellitus, type 2) 05/18/2012   Past Surgical History  Procedure Laterality Date  . Cesarean section     Family History  Problem Relation Age of Onset  . Heart attack Neg Hx    History  Substance Use Topics  . Smoking status: Never Smoker   . Smokeless tobacco: Not on file  . Alcohol Use: No   OB History   Grav Para Term Preterm Abortions TAB SAB Ect Mult Living                 Review of Systems  Constitutional: Negative for fever.  Respiratory: Negative for cough and shortness of breath.   Cardiovascular: Negative for chest pain.  Gastrointestinal: Positive for abdominal pain. Negative for diarrhea, blood in stool and melena.  Genitourinary: Negative for dysuria.  All other systems reviewed and are negative.     Allergies  Atenolol and Pork-derived products  Home Medications   Prior to Admission medications   Medication Sig Start  Date End Date Taking? Authorizing Provider  aspirin EC 81 MG tablet Take 81 mg by mouth daily.    Historical Provider, MD  famotidine (PEPCID AC) 10 MG chewable tablet Chew 2 tablets (20 mg total) by mouth daily. 05/18/12   Rexene Alberts, MD  ibuprofen (ADVIL) 200 MG tablet Take 2 tablets (400 mg total) by mouth every 6 (six) hours as needed for pain. 05/18/12   Rexene Alberts, MD  Iron-Vitamins (GERITOL) LIQD Take by mouth daily. 1 capful daily    Historical Provider, MD  losartan (COZAAR) 25 MG tablet Take 1 tablet (25 mg total) by mouth daily. 05/18/12   Rexene Alberts, MD  metFORMIN (GLUCOPHAGE) 500 MG tablet Take 1 tablet (500 mg total) by mouth daily with supper. 05/18/12   Rexene Alberts, MD  oxyCODONE-acetaminophen (ROXICET) 5-325 MG per tablet Take 1 tablet by mouth every 4 (four) hours as needed for pain. 05/18/12   Rexene Alberts, MD   BP 160/89  Pulse 97  Temp(Src) 98.6 F (37 C) (Oral)  Resp 24  Ht 5\' 1"  (1.549 m)  Wt 155 lb (70.308 kg)  BMI 29.30 kg/m2  SpO2 98% Physical Exam CONSTITUTIONAL: Well developed/well nourished HEAD: Normocephalic/atraumatic EYES: EOMI/PERRL, no icterus ENMT: Mucous membranes moist NECK: supple no meningeal signs SPINE:entire spine nontender CV: S1/S2 noted, no murmurs/rubs/gallops noted LUNGS: Lungs are clear  to auscultation bilaterally, no apparent distress ABDOMEN: soft, mild tenderness to LUQ and RUQ, no rebound or guarding GU:no cva tenderness NEURO: Pt is awake/alert, moves all extremitiesx4 EXTREMITIES: pulses normal, full ROM SKIN: warm, color normal PSYCH: no abnormalities of mood noted  ED Course  Procedures  11:24 PM Pt with vomiting and abdominal pain She has elevated WBC Will perform CT imaging of abd/pelvis EKG performed but unchanged from prior, however will also check troponin 3:05 AM Pt improved She is well appearing She reports mild burning, but in no distress Will start on protonix/prilosec and refer to GI I doubt occult  abdominal emergency I doubt ACS at this time BP 167/86  Pulse 78  Temp(Src) 98.6 F (37 C) (Oral)  Resp 17  Ht 5\' 1"  (1.549 m)  Wt 155 lb (70.308 kg)  BMI 29.30 kg/m2  SpO2 98%  Labs Review Labs Reviewed  CBC WITH DIFFERENTIAL - Abnormal; Notable for the following:    WBC 14.4 (*)    RBC 5.43 (*)    MCV 70.5 (*)    MCH 22.8 (*)    Platelets 413 (*)    Neutrophils Relative % 81 (*)    Neutro Abs 11.6 (*)    Monocytes Relative 2 (*)    All other components within normal limits  COMPREHENSIVE METABOLIC PANEL - Abnormal; Notable for the following:    Glucose, Bld 189 (*)    Total Protein 8.6 (*)    Alkaline Phosphatase 144 (*)    Anion gap 18 (*)    All other components within normal limits  LIPASE, BLOOD  URINALYSIS, ROUTINE W REFLEX MICROSCOPIC  TROPONIN I    Imaging Review Ct Abdomen Pelvis W Contrast  10/02/2013   CLINICAL DATA:  Abdominal pain.  EXAM: CT ABDOMEN AND PELVIS WITH CONTRAST  TECHNIQUE: Multidetector CT imaging of the abdomen and pelvis was performed using the standard protocol following bolus administration of intravenous contrast.  CONTRAST:  161mL OMNIPAQUE IOHEXOL 300 MG/ML  SOLN  COMPARISON:  Abdominal ultrasound performed 08/06/2009  FINDINGS: Minimal bibasilar atelectasis is noted.  Areas of fatty infiltration are seen within the liver. The spleen is unremarkable in appearance. The gallbladder is within normal limits. The pancreas and adrenal glands are unremarkable.  The kidneys are unremarkable in appearance. There is no evidence of hydronephrosis. No renal or ureteral stones are seen. No perinephric stranding is appreciated.  No free fluid is identified. The small bowel is unremarkable in appearance. The stomach is within normal limits. No acute vascular abnormalities are seen.  The appendix is normal in caliber, without evidence for appendicitis. A few diverticula are seen along the ascending colon. The colon is largely decompressed and otherwise  unremarkable in appearance.  The bladder is mildly distended and grossly unremarkable. Several prominent fibroids are seen within the mildly enlarged uterus. The ovaries are relatively symmetric. No suspicious adnexal masses are seen. No inguinal lymphadenopathy is seen.  No acute osseous abnormalities are identified.  IMPRESSION: 1. No acute abnormality seen to explain the patient's symptoms. 2. Prominent uterine fibroids noted; the uterus is mildly enlarged. 3. A few diverticula incidentally noted along the ascending colon. 4. Areas of fatty infiltration noted within the liver.   Electronically Signed   By: Garald Balding M.D.   On: 10/02/2013 02:33     EKG Interpretation   Date/Time:  Friday October 01 2013 23:11:19 EDT Ventricular Rate:  86 PR Interval:  156 QRS Duration: 79 QT Interval:  368 QTC Calculation: 440 R  Axis:   44 Text Interpretation:  Sinus rhythm Abnormal T, consider ischemia, lateral  leads artifact noted No significant change since last tracing Abnormal ekg  Confirmed by Christy Gentles  MD, Elenore Rota ([id number]) on 10/01/2013 11:22:00 PM      MDM   Final diagnoses:  Epigastric abdominal pain  Uterine leiomyoma, unspecified location  Dehydration    Nursing notes including past medical history and social history reviewed and considered in documentation Labs/vital reviewed and considered     Sharyon Cable, MD 10/02/13 [phone]

## 2013-10-01 NOTE — ED Notes (Signed)
Pt unable to give urine sample at the moment, stated she didn't have to go right now. Pt aware of the urine sample needed. RN notified

## 2013-10-01 NOTE — ED Notes (Signed)
Pt. reports low/mid abdominal pain with nausea and vomitting onset today , denies diarrhea . No fever or chills.

## 2013-10-02 ENCOUNTER — Emergency Department (HOSPITAL_COMMUNITY): Payer: BC Managed Care – PPO

## 2013-10-02 ENCOUNTER — Encounter (HOSPITAL_COMMUNITY): Payer: Self-pay

## 2013-10-02 LAB — URINALYSIS, ROUTINE W REFLEX MICROSCOPIC
Bilirubin Urine: NEGATIVE
GLUCOSE, UA: NEGATIVE mg/dL
Hgb urine dipstick: NEGATIVE
KETONES UR: NEGATIVE mg/dL
LEUKOCYTES UA: NEGATIVE
Nitrite: NEGATIVE
PROTEIN: NEGATIVE mg/dL
Specific Gravity, Urine: 1.009 (ref 1.005–1.030)
UROBILINOGEN UA: 0.2 mg/dL (ref 0.0–1.0)
pH: 5.5 (ref 5.0–8.0)

## 2013-10-02 LAB — TROPONIN I: Troponin I: <0.3 ng/mL (ref <0.30)

## 2013-10-02 MED ORDER — PANTOPRAZOLE SODIUM 40 MG IV SOLR
40.0000 mg | Freq: Once | INTRAVENOUS | Status: AC
  Administered 2013-10-02: 40 mg via INTRAVENOUS
  Filled 2013-10-02: qty 40

## 2013-10-02 MED ORDER — OMEPRAZOLE 20 MG PO CPDR: 20.0000 mg | DELAYED_RELEASE_CAPSULE | Freq: Every day | ORAL | Status: DC

## 2013-10-02 MED ORDER — IOHEXOL 300 MG/ML  SOLN
100.0000 mL | Freq: Once | INTRAMUSCULAR | Status: AC | PRN
  Administered 2013-10-02: 100 mL via INTRAVENOUS

## 2013-10-02 MED ORDER — ONDANSETRON HCL 4 MG/2ML IJ SOLN
4.0000 mg | Freq: Once | INTRAMUSCULAR | Status: AC
  Administered 2013-10-02: 4 mg via INTRAVENOUS
  Filled 2013-10-02: qty 2

## 2013-10-02 NOTE — ED Notes (Signed)
CT notified patient finished contrast.

## 2013-10-02 NOTE — Discharge Instructions (Signed)

## 2013-10-02 NOTE — ED Notes (Signed)
MD at bedside. 

## 2015-11-08 ENCOUNTER — Emergency Department (HOSPITAL_COMMUNITY)
Admission: EM | Admit: 2015-11-08 | Discharge: 2015-11-08 | Disposition: A | Discharge status: Home / Self Care | Payer: BLUE CROSS/BLUE SHIELD | Attending: Emergency Medicine | Admitting: Emergency Medicine

## 2015-11-08 ENCOUNTER — Emergency Department (HOSPITAL_COMMUNITY): Payer: BLUE CROSS/BLUE SHIELD

## 2015-11-08 ENCOUNTER — Encounter (HOSPITAL_COMMUNITY): Payer: Self-pay

## 2015-11-08 ENCOUNTER — Emergency Department (HOSPITAL_BASED_OUTPATIENT_CLINIC_OR_DEPARTMENT_OTHER)
Admit: 2015-11-08 | Discharge: 2015-11-08 | Disposition: A | Discharge status: Home / Self Care | Payer: BLUE CROSS/BLUE SHIELD

## 2015-11-08 DIAGNOSIS — M79609 Pain in unspecified limb: Secondary | ICD-10-CM | POA: Diagnosis not present

## 2015-11-08 DIAGNOSIS — E119 Type 2 diabetes mellitus without complications: Secondary | ICD-10-CM | POA: Diagnosis not present

## 2015-11-08 DIAGNOSIS — M79652 Pain in left thigh: Secondary | ICD-10-CM | POA: Diagnosis present

## 2015-11-08 DIAGNOSIS — M25562 Pain in left knee: Secondary | ICD-10-CM | POA: Insufficient documentation

## 2015-11-08 DIAGNOSIS — M25552 Pain in left hip: Secondary | ICD-10-CM | POA: Diagnosis not present

## 2015-11-08 DIAGNOSIS — I10 Essential (primary) hypertension: Secondary | ICD-10-CM | POA: Insufficient documentation

## 2015-11-08 MED ORDER — LIDOCAINE 5 % EX PTCH: 1.0000 | MEDICATED_PATCH | CUTANEOUS | 0 refills | Status: DC

## 2015-11-08 MED ORDER — OXYCODONE-ACETAMINOPHEN 5-325 MG PO TABS
ORAL_TABLET | ORAL | Status: AC
  Filled 2015-11-08: qty 1

## 2015-11-08 MED ORDER — OXYCODONE-ACETAMINOPHEN 5-325 MG PO TABS
1.0000 | ORAL_TABLET | ORAL | Status: DC | PRN
Start: 2015-11-08 — End: 2015-11-08
  Administered 2015-11-08: 1 via ORAL

## 2015-11-08 MED ORDER — MELOXICAM 7.5 MG PO TABS: 7.5000 mg | ORAL_TABLET | Freq: Every day | ORAL | 0 refills | Status: DC

## 2015-11-08 NOTE — ED Notes (Signed)
Pt to xray

## 2015-11-08 NOTE — ED Provider Notes (Signed)
[facility] DEPT Provider Note   CSN: [id number] Arrival date & time: 11/08/15  1439     History   Chief Complaint Chief Complaint  Patient presents with  . Leg Pain    HPI Erin Lawson is a 55 y.o. female.  HPI   Pt presents with two days of left leg pain.  States two days ago her knee started hurting, states it felt swollen.  She rubbed it and the next day her thigh and hip started hurting.  Pain is throbbing and aching, worse with ambulation.  Denies fevers, back pain, trauma, change in activities, recent immobilization, exogenous estrogen, family or personal hx blood clots.  She takes no medications and has taken no medications for these symptoms.  Did have GI illness over the weekend, prior to the pain starting that consisted of upper abdominal pain and vomiting, change in her bowel movements defined as normal consistency bowel movements that occur at a different time of day than is normal for her.  Denies fevers.    Past Medical History:  Diagnosis Date  . DM type 2 (diabetes mellitus, type 2) ([place]) 05/18/2012  . Hypertension     Patient Active Problem List   Diagnosis Date Noted  . Chest pain 05/18/2012  . Anemia 05/18/2012  . Hyperglycemia 05/18/2012  . DM type 2 (diabetes mellitus, type 2) ([place]) 05/18/2012  . SINUSITIS- ACUTE-NOS 06/16/2009  . HEADACHE 06/16/2009  . ANXIETY 04/06/2007  . HYPERTENSION 04/06/2007  . KELOID 04/06/2007    Past Surgical History:  Procedure Laterality Date  . CESAREAN SECTION      OB History    No data available       Home Medications    Prior to Admission medications   Medication Sig Start Date End Date Taking? Authorizing Provider  lidocaine (LIDODERM) 5 % Place 1 patch onto the skin daily. Remove & Discard patch within 12 hours or as directed by MD 11/08/15   Clayton Bibles, PA-C  meloxicam (MOBIC) 7.5 MG tablet Take 1-2 tablets (7.5-15 mg total) by mouth daily. 11/08/15   Clayton Bibles, PA-C  omeprazole (PRILOSEC) 20  MG capsule Take 1 capsule (20 mg total) by mouth daily. 10/02/13   Ripley Fraise, MD    Family History Family History  Problem Relation Age of Onset  . Heart attack Neg Hx     Social History Social History  Substance Use Topics  . Smoking status: Never Smoker  . Smokeless tobacco: Never Used  . Alcohol use No     Allergies   Atenolol and Pork-derived products   Review of Systems Review of Systems  All other systems reviewed and are negative.    Physical Exam Updated Vital Signs BP 180/63   Pulse 60   Temp 98.2 F (36.8 C) (Oral)   Resp 18   Ht 5\' 1"  (1.549 m)   Wt 70.3 kg   SpO2 97%   BMI 29.29 kg/m   Physical Exam  Constitutional: She appears well-developed and well-nourished. No distress.  HENT:  Head: Normocephalic and atraumatic.  Neck: Neck supple.  Pulmonary/Chest: Effort normal.  Musculoskeletal:  Spine nontender, no crepitus, or stepoffs. Left hip without erythema, edema, warmth.  Left lateral hip is tender to palpation, pain with passive ROM.  Left posterior knee with mild tenderness to palpation.  Full passive ROM without pain.  No erythema, edema, warmth throughout left leg.  Distal pulses and sensation intact.  Ambulates independently with limping gait.    Neurological: She is  alert.  Skin: She is not diaphoretic.  Nursing note and vitals reviewed.    ED Treatments / Results  Labs (all labs ordered are listed, but only abnormal results are displayed) Labs Reviewed - No data to display  EKG  EKG Interpretation None       Radiology Dg Knee Complete 4 Views Left  Result Date: 11/08/2015 CLINICAL DATA:  Left knee pain.  No reported injury. EXAM: LEFT KNEE - COMPLETE 4+ VIEW COMPARISON:  None. FINDINGS: No fracture, joint effusion, dislocation or suspicious focal osseous lesion. Minimal joint space narrowing with tiny marginal osteophytes in the medial and lateral compartments. Patellofemoral compartment appears preserved. IMPRESSION:  Minimal medial and lateral compartment osteoarthritis in the left knee. Electronically Signed   By: Ilona Sorrel M.D.   On: 11/08/2015 16:50   Dg Hip Unilat W Or Wo Pelvis 2-3 Views Left  Result Date: 11/08/2015 CLINICAL DATA:  Pain without trauma. EXAM: DG HIP (WITH OR WITHOUT PELVIS) 2-3V LEFT COMPARISON:  None. FINDINGS: Femoral heads are located. Sacroiliac joints are symmetric. No focal osseous lesion. No acute fracture. Soft tissue artifact projects over the hip on all views. IMPRESSION: No acute osseous abnormality. Electronically Signed   By: Abigail Miyamoto M.D.   On: 11/08/2015 16:52    Procedures Procedures (including critical care time)  Medications Ordered in ED Medications  oxyCODONE-acetaminophen (PERCOCET/ROXICET) 5-325 MG per tablet 1 tablet (1 tablet Oral Given 11/08/15 1452)  oxyCODONE-acetaminophen (PERCOCET/ROXICET) 5-325 MG per tablet (not administered)     Initial Impression / Assessment and Plan / ED Course  I have reviewed the triage vital signs and the nursing notes.  Pertinent labs & imaging results that were available during my care of the patient were reviewed by me and considered in my medical decision making (see chart for details).  Clinical Course    Afebrile, nontoxic patient with left upper leg pain.  Negative for DVT.  Xrays of hip and knee show arthritis in left knee.  Left hip film negative.  No erythema, warmth.  No fevers.  No systemic symptoms.  No effusion on xray.  Doubt septic arthritis.  Question possibility of reactive arthritis given recent GI symptoms.  No other symptoms through thorough ROS.    D/C home with mobic, lidoderm patch, crutches, orthopedic follow up.   Discussed result, findings, treatment, and follow up  with patient.  Pt given return precautions.  Pt verbalizes understanding and agrees with plan.       Final Clinical Impressions(s) / ED Diagnoses   Final diagnoses:  Left hip pain    New Prescriptions Discharge Medication  List as of 11/08/2015  5:24 PM    START taking these medications   Details  lidocaine (LIDODERM) 5 % Place 1 patch onto the skin daily. Remove & Discard patch within 12 hours or as directed by MD, Starting Wed 11/08/2015, Print    meloxicam (MOBIC) 7.5 MG tablet Take 1-2 tablets (7.5-15 mg total) by mouth daily., Starting Wed 11/08/2015, Print         [place], PA-C 11/08/15 [place], MD 11/09/15 [phone]

## 2015-11-08 NOTE — ED Triage Notes (Signed)
Pt reports left knee pain that started 2 days ago. Pt now reports pain in the upper thigh area. Pt reports pain is worse with walking and sitting.

## 2015-11-08 NOTE — ED Notes (Signed)
Pt reports she does not want x-rays at this time.

## 2015-11-08 NOTE — Progress Notes (Signed)
**  Preliminary report by tech**  Left lower extremity venous duplex completed. There is no obvious evidence of deep or superficial vein thrombosis involving the left lower extremity. All visualized vessels appear patent and compressible. There is no evidence of a Baker's cyst on the left.  Results given to [facility] PA  11/08/15 4:20 PM Carlos Levering RVT

## 2015-11-08 NOTE — Discharge Instructions (Signed)

## 2015-11-08 NOTE — ED Notes (Signed)
Pt states she does not have HTN and doesn't take medicine for it, this nurse instructed pt to follow up with her PCP about her high blood pressure.

## 2015-11-09 ENCOUNTER — Telehealth (INDEPENDENT_AMBULATORY_CARE_PROVIDER_SITE_OTHER): Payer: Self-pay | Admitting: Orthopaedic Surgery

## 2015-11-09 NOTE — Telephone Encounter (Signed)
Please advise 

## 2015-11-09 NOTE — Telephone Encounter (Signed)
LMOM for patient of the below message  

## 2015-11-09 NOTE — Telephone Encounter (Signed)
Patient is requesting a some for her pain. Patient states that the ED gave her meloxicam and is that is only working for a few hours and is not working. Patient is schedule to see Benita Stabile on 11/20/2015

## 2015-11-09 NOTE — Telephone Encounter (Signed)
Needs to get from PCP we have not established a relationship with this patient yet.

## 2015-11-11 ENCOUNTER — Telehealth (HOSPITAL_BASED_OUTPATIENT_CLINIC_OR_DEPARTMENT_OTHER): Payer: Self-pay | Admitting: *Deleted

## 2015-11-20 ENCOUNTER — Ambulatory Visit (INDEPENDENT_AMBULATORY_CARE_PROVIDER_SITE_OTHER): Payer: Self-pay | Admitting: Physician Assistant

## 2015-12-01 ENCOUNTER — Ambulatory Visit (INDEPENDENT_AMBULATORY_CARE_PROVIDER_SITE_OTHER): Payer: BLUE CROSS/BLUE SHIELD | Admitting: Family Medicine

## 2015-12-01 ENCOUNTER — Ambulatory Visit (INDEPENDENT_AMBULATORY_CARE_PROVIDER_SITE_OTHER): Payer: BLUE CROSS/BLUE SHIELD

## 2015-12-01 VITALS — BP 168/98 | HR 55 | Temp 97.5°F | Resp 16 | Ht 61.0 in | Wt 150.0 lb

## 2015-12-01 DIAGNOSIS — S335XXA Sprain of ligaments of lumbar spine, initial encounter: Secondary | ICD-10-CM

## 2015-12-01 DIAGNOSIS — E119 Type 2 diabetes mellitus without complications: Secondary | ICD-10-CM

## 2015-12-01 DIAGNOSIS — M545 Low back pain, unspecified: Secondary | ICD-10-CM

## 2015-12-01 DIAGNOSIS — I10 Essential (primary) hypertension: Secondary | ICD-10-CM | POA: Diagnosis not present

## 2015-12-01 LAB — COMPREHENSIVE METABOLIC PANEL
ALBUMIN: 4.5 g/dL (ref 3.6–5.1)
ALT: 21 U/L (ref 6–29)
AST: 19 U/L (ref 10–35)
Alkaline Phosphatase: 114 U/L (ref 33–130)
BUN: 14 mg/dL (ref 7–25)
CO2: 25 mmol/L (ref 20–31)
CREATININE: 0.66 mg/dL (ref 0.50–1.05)
Calcium: 9.7 mg/dL (ref 8.6–10.4)
Chloride: 102 mmol/L (ref 98–110)
Glucose, Bld: 110 mg/dL — ABNORMAL HIGH (ref 65–99)
POTASSIUM: 4.2 mmol/L (ref 3.5–5.3)
SODIUM: 135 mmol/L (ref 135–146)
Total Bilirubin: 0.4 mg/dL (ref 0.2–1.2)
Total Protein: 7.1 g/dL (ref 6.1–8.1)

## 2015-12-01 LAB — POCT CBC
GRANULOCYTE PERCENT: 49.5 % (ref 37–80)
HEMATOCRIT: 34.4 % — AB (ref 37.7–47.9)
Hemoglobin: 11.5 g/dL — AB (ref 12.2–16.2)
Lymph, poc: 4.6 — AB (ref 0.6–3.4)
MCH, POC: 23.3 pg — AB (ref 27–31.2)
MCHC: 33.4 g/dL (ref 31.8–35.4)
MCV: 69.6 fL — AB (ref 80–97)
MID (cbc): 0.4 (ref 0–0.9)
MPV: 7.2 fL (ref 0–99.8)
POC GRANULOCYTE: 4.9 (ref 2–6.9)
POC LYMPH %: 46.5 % (ref 10–50)
POC MID %: 4 %M (ref 0–12)
Platelet Count, POC: 367 10*3/uL (ref 142–424)
RBC: 4.94 M/uL (ref 4.04–5.48)
RDW, POC: 13.8 %
WBC: 9.9 10*3/uL (ref 4.6–10.2)

## 2015-12-01 LAB — GLUCOSE, POCT (MANUAL RESULT ENTRY): POC GLUCOSE: 107 mg/dL — AB (ref 70–99)

## 2015-12-01 LAB — POCT URINALYSIS DIP (MANUAL ENTRY)
BILIRUBIN UA: NEGATIVE
Bilirubin, UA: NEGATIVE
Blood, UA: NEGATIVE
GLUCOSE UA: NEGATIVE
Leukocytes, UA: NEGATIVE
Nitrite, UA: NEGATIVE
Protein Ur, POC: NEGATIVE
SPEC GRAV UA: 1.015
Urobilinogen, UA: 0.2
pH, UA: 5.5

## 2015-12-01 LAB — TSH: TSH: 1.93 mIU/L

## 2015-12-01 LAB — POCT GLYCOSYLATED HEMOGLOBIN (HGB A1C): Hemoglobin A1C: 7.2

## 2015-12-01 MED ORDER — NAPROXEN 500 MG PO TABS: 500.0000 mg | ORAL_TABLET | Freq: Two times a day (BID) | ORAL | 0 refills | Status: DC

## 2015-12-01 MED ORDER — METHOCARBAMOL 500 MG PO TABS: 500.0000 mg | ORAL_TABLET | Freq: Three times a day (TID) | ORAL | 0 refills | Status: DC | PRN

## 2015-12-01 NOTE — Patient Instructions (Signed)
Low Back Sprain Rehab Ask your health care provider which exercises are safe for you. Do exercises exactly as told by your health care provider and adjust them as directed. It is normal to feel mild stretching, pulling, tightness, or discomfort as you do these exercises, but you should stop right away if you feel sudden pain or your pain gets worse. Do not begin these exercises until told by your health care provider. Stretching and range of motion exercises These exercises warm up your muscles and joints and improve the movement and flexibility of your back. These exercises also help to relieve pain, numbness, and tingling. Exercise A: Lumbar rotation 1. Lie on your back on a firm surface and bend your knees. 2. Straighten your arms out to your sides so each arm forms an "L" shape with a side of your body (a 90 degree angle). 3. Slowly move both of your knees to one side of your body until you feel a stretch in your lower back. Try not to let your shoulders move off of the floor. 4. Hold for __________ seconds. 5. Tense your abdominal muscles and slowly move your knees back to the starting position. 6. Repeat this exercise on the other side of your body. Repeat __________ times. Complete this exercise __________ times a day. Exercise B: Prone extension on elbows 1. Lie on your abdomen on a firm surface. 2. Prop yourself up on your elbows. 3. Use your arms to help lift your chest up until you feel a gentle stretch in your abdomen and your lower back.  This will place some of your body weight on your elbows. If this is uncomfortable, try stacking pillows under your chest.  Your hips should stay down, against the surface that you are lying on. Keep your hip and back muscles relaxed. 4. Hold for __________ seconds. 5. Slowly relax your upper body and return to the starting position. Repeat __________ times. Complete this exercise __________ times a day. Strengthening exercises These  exercises build strength and endurance in your back. Endurance is the ability to use your muscles for a long time, even after they get tired. Exercise C: Pelvic tilt 1. Lie on your back on a firm surface. Bend your knees and keep your feet flat. 2. Tense your abdominal muscles. Tip your pelvis up toward the ceiling and flatten your lower back into the floor.  To help with this exercise, you may place a small towel under your lower back and try to push your back into the towel. 3. Hold for __________ seconds. 4. Let your muscles relax completely before you repeat this exercise. Repeat __________ times. Complete this exercise __________ times a day. Exercise D: Alternating arm and leg raises 1. Get on your hands and knees on a firm surface. If you are on a hard floor, you may want to use padding to cushion your knees, such as an exercise mat. 2. Line up your arms and legs. Your hands should be below your shoulders, and your knees should be below your hips. 3. Lift your left leg behind you. At the same time, raise your right arm and straighten it in front of you.  Do not lift your leg higher than your hip.  Do not lift your arm higher than your shoulder.  Keep your abdominal and back muscles tight.  Keep your hips facing the ground.  Do not arch your back.  Keep your balance carefully, and do not hold your breath. 4. Hold for __________  seconds. 5. Slowly return to the starting position and repeat with your right leg and your left arm. Repeat __________ times. Complete this exercise __________ times a day. Exercise E: Abdominal set with straight leg raise 1. Lie on your back on a firm surface. 2. Bend one of your knees and keep your other leg straight. 3. Tense your abdominal muscles and lift your straight leg up, 4-6 inches (10-15 cm) off the ground. 4. Keep your abdominal muscles tight and hold for __________ seconds.  Do not hold your breath.  Do not arch your back. Keep it flat  against the ground. 5. Keep your abdominal muscles tense as you slowly lower your leg back to the starting position. 6. Repeat with your other leg. Repeat __________ times. Complete this exercise __________ times a day. Posture and body mechanics   Body mechanics refers to the movements and positions of your body while you do your daily activities. Posture is part of body mechanics. Good posture and healthy body mechanics can help to relieve stress in your body's tissues and joints. Good posture means that your spine is in its natural S-curve position (your spine is neutral), your shoulders are pulled back slightly, and your head is not tipped forward. The following are general guidelines for applying improved posture and body mechanics to your everyday activities. Standing   When standing, keep your spine neutral and your feet about hip-width apart. Keep a slight bend in your knees. Your ears, shoulders, and hips should line up.  When you do a task in which you stand in one place for a long time, place one foot up on a stable object that is 2-4 inches (5-10 cm) high, such as a footstool. This helps keep your spine neutral. Sitting  When sitting, keep your spine neutral and keep your feet flat on the floor. Use a footrest, if necessary, and keep your thighs parallel to the floor. Avoid rounding your shoulders, and avoid tilting your head forward.  When working at a desk or a computer, keep your desk at a height where your hands are slightly lower than your elbows. Slide your chair under your desk so you are close enough to maintain good posture.  When working at a computer, place your monitor at a height where you are looking straight ahead and you do not have to tilt your head forward or downward to look at the screen. Resting   When lying down and resting, avoid positions that are most painful for you.  If you have pain with activities such as sitting, bending, stooping, or squatting  (flexion-based activities), lie in a position in which your body does not bend very much. For example, avoid curling up on your side with your arms and knees near your chest (fetal position).  If you have pain with activities such as standing for a long time or reaching with your arms (extension-based activities), lie with your spine in a neutral position and bend your knees slightly. Try the following positions:  Lying on your side with a pillow between your knees.  Lying on your back with a pillow under your knees. Lifting   When lifting objects, keep your feet at least shoulder-width apart and tighten your abdominal muscles.  Bend your knees and hips and keep your spine neutral. It is important to lift using the strength of your legs, not your back. Do not lock your knees straight out.  Always ask for help to lift heavy or awkward objects. This  information is not intended to replace advice given to you by your health care provider. Make sure you discuss any questions you have with your health care provider. Document Released: 12/31/2004 Document Revised: 09/07/2015 Document Reviewed: 10/12/2014 Elsevier Interactive Patient Education  2017 [employer].       IF you received an x-ray today, you will receive an invoice from [facility]. Please contact [facility] at [phone] with questions or concerns regarding your invoice.   IF you received labwork today, you will receive an invoice from [employer]. Please contact [facility] at [phone] with questions or concerns regarding your invoice.   Our billing staff will not be able to assist you with questions regarding bills from these companies.  You will be contacted with the lab results as soon as they are available. The fastest way to get your results is to activate your My Chart account. Instructions are located on the last page of this paperwork. If you have not heard from [name]  regarding the results in 2 weeks, please contact this office.

## 2015-12-01 NOTE — Progress Notes (Signed)
Subjective:    Patient ID: Erin Lawson, female    DOB: Sep 17, 1960, 55 y.o.   MRN: [id number]  12/01/2015  Back Pain (lower right, x 4 days ) and Side pain (right )   HPI This 55 y.o. female presents for evaluation of R lumbar pain.  Can radiate to L side of body.  Five days ago, developed R sided pain.  Can feel pain shift on R side to middle of lower back towards spine. Worried about infection because Aleve does not work. Drinking water and cranberry juice with improvement in urine color.  Pain is still persistent.  +fever on R side of head only.    A couple of weeks ago, presented to ED; could not walk; felt crippled; had to use crutches to ambulate out.  Leg was turned out; was in a lot of pain from hip to foot; unable to bear weight.  Worried about blood clot; s/p venous doppler negative for DVT; s/p xray of leg.  Prescribed Meloxicam.  Had whelps on buttocks; annular rash eight of them and painful to sit.  Very red;applied diaper rash cream to area; very painful rash. Usually with this rash, would come with cycle; usually has cleared before can make appointment. This episode, painful rash which is unusual to have pain.  Cannot sleep due to pain. Also has headache on R side.  Takes Aleve without improvement.  R eye seems red; usually white conjunctiva.   ED provider concerned about arthritis; referred to ortho yet cancelled appointment due to expense and feeling better.  Took flu vaccine 10/19/15 at [facility].  Blood pressure ranges 130-150s.  Previous diagnosis of DMII in the past; currently not taking any medication for DMII; currently not under the care of provider for DMII.   Review of Systems  Constitutional: Negative for chills, diaphoresis, fatigue and fever.  HENT: Negative for ear pain, postnasal drip, rhinorrhea, sinus pressure, sore throat and trouble swallowing.   Eyes: Negative for visual disturbance.  Respiratory: Negative for cough and shortness of  breath.   Cardiovascular: Negative for chest pain, palpitations and leg swelling.  Gastrointestinal: Negative for abdominal pain, constipation, diarrhea, nausea and vomiting.  Endocrine: Negative for cold intolerance, heat intolerance, polydipsia, polyphagia and polyuria.  Genitourinary: Negative for difficulty urinating and dysuria.  Musculoskeletal: Positive for back pain and myalgias.  Neurological: Negative for dizziness, tremors, seizures, syncope, facial asymmetry, speech difficulty, weakness, light-headedness, numbness and headaches.    Past Medical History:  Diagnosis Date  . DM type 2 (diabetes mellitus, type 2) ([place]) 05/18/2012  . Hypertension    Past Surgical History:  Procedure Laterality Date  . CESAREAN SECTION     Allergies  Allergen Reactions  . Atenolol     REACTION: made me feel bad  . Pork-Derived Products Other (See Comments)    Migraine     Social History   Social History  . Marital status: Single    Spouse name: N/A  . Number of children: N/A  . Years of education: N/A   Occupational History  . Not on file.   Social History Main Topics  . Smoking status: Never Smoker  . Smokeless tobacco: Never Used  . Alcohol use No  . Drug use: No  . Sexual activity: Not on file   Other Topics Concern  . Not on file   Social History Narrative  . No narrative on file   Family History  Problem Relation Age of Onset  . Heart attack  Neg Hx        Objective:    BP (!) 168/98 (BP Location: Right Arm, Patient Position: Sitting, Cuff Size: Normal)   Pulse (!) 55   Temp 97.5 F (36.4 C) (Oral)   Resp 16   Ht 5\' 1"  (1.549 m)   Wt 150 lb (68 kg)   SpO2 99%   BMI 28.34 kg/m  Physical Exam  Constitutional: She is oriented to person, place, and time. She appears well-developed and well-nourished. No distress.  HENT:  Head: Normocephalic and atraumatic.  Right Ear: External ear normal.  Left Ear: External ear normal.  Nose: Nose normal.  Mouth/Throat:  Oropharynx is clear and moist.  Eyes: Conjunctivae and EOM are normal. Pupils are equal, round, and reactive to light.  Neck: Normal range of motion. Neck supple. Carotid bruit is not present. No thyromegaly present.  Cardiovascular: Normal rate, regular rhythm, normal heart sounds and intact distal pulses.  Exam reveals no gallop and no friction rub.   No murmur heard. Pulmonary/Chest: Effort normal and breath sounds normal. She has no wheezes. She has no rales.  Abdominal: Soft. Bowel sounds are normal. She exhibits no distension and no mass. There is no tenderness. There is no rebound and no guarding.  Musculoskeletal:       Lumbar back: She exhibits decreased range of motion, tenderness and pain. She exhibits no bony tenderness, no spasm and normal pulse.  Lumbar spine:  Non-tender midline; +tender paraspinal regions B.  Straight leg raises negative B; toe and heel walking intact; marching intact; motor 5/5 BLE.  Full ROM lumbar spine with mild limitation.   Lymphadenopathy:    She has no cervical adenopathy.  Neurological: She is alert and oriented to person, place, and time. No cranial nerve deficit.  Skin: Skin is warm and dry. No rash noted. She is not diaphoretic. No erythema. No pallor.  Psychiatric: She has a normal mood and affect. Her behavior is normal.  Nursing note and vitals reviewed.  Results for orders placed or performed in visit on 12/01/15  Comprehensive metabolic panel  Result Value Ref Range   Sodium 135 135 - 146 mmol/L   Potassium 4.2 3.5 - 5.3 mmol/L   Chloride 102 98 - 110 mmol/L   CO2 25 20 - 31 mmol/L   Glucose, Bld 110 (H) 65 - 99 mg/dL   BUN 14 7 - 25 mg/dL   Creat 0.66 0.50 - 1.05 mg/dL   Total Bilirubin 0.4 0.2 - 1.2 mg/dL   Alkaline Phosphatase 114 33 - 130 U/L   AST 19 10 - 35 U/L   ALT 21 6 - 29 U/L   Total Protein 7.1 6.1 - 8.1 g/dL   Albumin 4.5 3.6 - 5.1 g/dL   Calcium 9.7 8.6 - 10.4 mg/dL  Microalbumin, urine  Result Value Ref Range    Microalb, Ur 0.6 Not estab mg/dL  TSH  Result Value Ref Range   TSH 1.93 mIU/L  POCT urinalysis dipstick  Result Value Ref Range   Color, UA yellow yellow   Clarity, UA clear clear   Glucose, UA negative negative   Bilirubin, UA negative negative   Ketones, POC UA negative negative   Spec Grav, UA 1.015    Blood, UA negative negative   pH, UA 5.5    Protein Ur, POC negative negative   Urobilinogen, UA 0.2    Nitrite, UA Negative Negative   Leukocytes, UA Negative Negative  POCT glucose (manual entry)  Result Value  Ref Range   POC Glucose 107 (A) 70 - 99 mg/dl  POCT glycosylated hemoglobin (Hb A1C)  Result Value Ref Range   Hemoglobin A1C 7.2   POCT CBC  Result Value Ref Range   WBC 9.9 4.6 - 10.2 K/uL   Lymph, poc 4.6 (A) 0.6 - 3.4   POC LYMPH PERCENT 46.5 10 - 50 %L   MID (cbc) 0.4 0 - 0.9   POC MID % 4.0 0 - 12 %M   POC Granulocyte 4.9 2 - 6.9   Granulocyte percent 49.5 37 - 80 %G   RBC 4.94 4.04 - 5.48 M/uL   Hemoglobin 11.5 (A) 12.2 - 16.2 g/dL   HCT, POC 34.4 (A) 37.7 - 47.9 %   MCV 69.6 (A) 80 - 97 fL   MCH, POC 23.3 (A) 27 - 31.2 pg   MCHC 33.4 31.8 - 35.4 g/dL   RDW, POC 13.8 %   Platelet Count, POC 367 142 - 424 K/uL   MPV 7.2 0 - 99.8 fL   No results found.     Assessment & Plan:   1. Acute right-sided low back pain without sciatica   2. Type 2 diabetes mellitus without complication, without long-term current use of insulin ([place])   3. Essential hypertension   4. Lumbar sprain, initial encounter    -New onset; rx for Naproxen and Robaxin provided; recommend rest, frequent ambulation, heat to area bid for 15 minutes.  Home exercise program provided. -highly recommend follow-up treatment with PCP for hypertension and diabetes; recommend weight loss, exercise, low-sugar and low-calorie food choices. -recommend checking sugar daily   Orders Placed This Encounter  Procedures  . DG Lumbar Spine Complete    Standing Status:   Future    Number of  Occurrences:   1    Standing Expiration Date:   11/30/2016    Order Specific Question:   Reason for Exam (SYMPTOM  OR DIAGNOSIS REQUIRED)    Answer:   R lower back pain    Order Specific Question:   Is the patient pregnant?    Answer:   No    Order Specific Question:   Preferred imaging location?    Answer:   External  . Comprehensive metabolic panel  . Microalbumin, urine  . TSH  . POCT urinalysis dipstick  . POCT glucose (manual entry)  . POCT glycosylated hemoglobin (Hb A1C)  . POCT CBC   Meds ordered this encounter  Medications  . DISCONTD: naproxen sodium (ANAPROX) 220 MG tablet    Sig: Take 220 mg by mouth 2 (two) times daily with a meal.  . methocarbamol (ROBAXIN) 500 MG tablet    Sig: Take 1 tablet (500 mg total) by mouth every 8 (eight) hours as needed for muscle spasms.    Dispense:  45 tablet    Refill:  0  . naproxen (NAPROSYN) 500 MG tablet    Sig: Take 1 tablet (500 mg total) by mouth 2 (two) times daily with a meal.    Dispense:  44 tablet    Refill:  0    No Follow-up on file.   Kristi Elayne Guerin, M.D. [facility] [address] [place], [place]  [address] [phone] phone [phone] fax

## 2015-12-02 LAB — MICROALBUMIN, URINE: Microalb, Ur: 0.6 mg/dL

## 2016-04-05 ENCOUNTER — Encounter: Payer: Self-pay | Admitting: Family Medicine

## 2016-08-27 ENCOUNTER — Ambulatory Visit (INDEPENDENT_AMBULATORY_CARE_PROVIDER_SITE_OTHER): Payer: BLUE CROSS/BLUE SHIELD | Admitting: Family Medicine

## 2016-08-27 ENCOUNTER — Encounter: Payer: Self-pay | Admitting: Family Medicine

## 2016-08-27 VITALS — BP 168/95 | HR 72 | Temp 98.0°F | Resp 16 | Ht 60.63 in | Wt 144.0 lb

## 2016-08-27 DIAGNOSIS — G44209 Tension-type headache, unspecified, not intractable: Secondary | ICD-10-CM

## 2016-08-27 DIAGNOSIS — G4489 Other headache syndrome: Secondary | ICD-10-CM

## 2016-08-27 DIAGNOSIS — R0602 Shortness of breath: Secondary | ICD-10-CM | POA: Diagnosis not present

## 2016-08-27 DIAGNOSIS — N644 Mastodynia: Secondary | ICD-10-CM | POA: Diagnosis not present

## 2016-08-27 DIAGNOSIS — I10 Essential (primary) hypertension: Secondary | ICD-10-CM

## 2016-08-27 DIAGNOSIS — R252 Cramp and spasm: Secondary | ICD-10-CM | POA: Diagnosis not present

## 2016-08-27 DIAGNOSIS — E119 Type 2 diabetes mellitus without complications: Secondary | ICD-10-CM

## 2016-08-27 DIAGNOSIS — H538 Other visual disturbances: Secondary | ICD-10-CM

## 2016-08-27 LAB — GLUCOSE, POCT (MANUAL RESULT ENTRY): POC Glucose: 189 mg/dl — AB (ref 70–99)

## 2016-08-27 LAB — POCT URINALYSIS DIP (MANUAL ENTRY)
GLUCOSE UA: NEGATIVE mg/dL
Ketones, POC UA: NEGATIVE mg/dL
Leukocytes, UA: NEGATIVE
NITRITE UA: NEGATIVE
PH UA: 5 (ref 5.0–8.0)
Protein Ur, POC: 30 mg/dL — AB
Spec Grav, UA: >=1.03 — AB (ref 1.010–1.025)
UROBILINOGEN UA: 0.2 U/dL

## 2016-08-27 LAB — POCT URINE PREGNANCY: PREG TEST UR: NEGATIVE

## 2016-08-27 LAB — POCT GLYCOSYLATED HEMOGLOBIN (HGB A1C): Hemoglobin A1C: 12.5

## 2016-08-27 MED ORDER — METFORMIN HCL 500 MG PO TABS: 500.0000 mg | ORAL_TABLET | Freq: Two times a day (BID) | ORAL | 1 refills | Status: DC

## 2016-08-27 NOTE — Patient Instructions (Addendum)
IF you received an x-ray today, you will receive an invoice from [facility]. Please contact [facility] at [phone] with questions or concerns regarding your invoice.   IF you received labwork today, you will receive an invoice from [facility]. Please contact [facility] at [phone] with questions or concerns regarding your invoice.   Our billing staff will not be able to assist you with questions regarding bills from these companies.  You will be contacted with the lab results as soon as they are available. The fastest way to get your results is to activate your My Chart account. Instructions are located on the last page of this paperwork. If you have not heard from [name] regarding the results in 2 weeks, please contact this office.    d Carbohydrate Counting for Diabetes Mellitus, Adult Carbohydrate counting is a method for keeping track of how many carbohydrates you eat. Eating carbohydrates naturally increases the amount of sugar (glucose) in the blood. Counting how many carbohydrates you eat helps keep your blood glucose within normal limits, which helps you manage your diabetes (diabetes mellitus). It is important to know how many carbohydrates you can safely have in each meal. This is different for every person. A diet and nutrition specialist (registered dietitian) can help you make a meal plan and calculate how many carbohydrates you should have at each meal and snack. Carbohydrates are found in the following foods:  Grains, such as breads and cereals.  Dried beans and soy products.  Starchy vegetables, such as potatoes, peas, and corn.  Fruit and fruit juices.  Milk and yogurt.  Sweets and snack foods, such as cake, cookies, candy, chips, and soft drinks.  How do I count carbohydrates? There are two ways to count carbohydrates in food. You can use either of the methods or a combination of both. Reading "Nutrition Facts" on packaged food The  "Nutrition Facts" list is included on the labels of almost all packaged foods and beverages in the [place] It includes:  The serving size.  Information about nutrients in each serving, including the grams (g) of carbohydrate per serving.  To use the "Nutrition Facts":  Decide how many servings you will have.  Multiply the number of servings by the number of carbohydrates per serving.  The resulting number is the total amount of carbohydrates that you will be having.  Learning standard serving sizes of other foods When you eat foods containing carbohydrates that are not packaged or do not include "Nutrition Facts" on the label, you need to measure the servings in order to count the amount of carbohydrates:  Measure the foods that you will eat with a food scale or measuring cup, if needed.  Decide how many standard-size servings you will eat.  Multiply the number of servings by 15. Most carbohydrate-rich foods have about 15 g of carbohydrates per serving. ? For example, if you eat 8 oz (170 g) of strawberries, you will have eaten 2 servings and 30 g of carbohydrates (2 servings x 15 g = 30 g).  For foods that have more than one food mixed, such as soups and casseroles, you must count the carbohydrates in each food that is included.  The following list contains standard serving sizes of common carbohydrate-rich foods. Each of these servings has about 15 g of carbohydrates:   hamburger bun or  English muffin.   oz (15 mL) syrup.   oz (14 g) jelly.  1 slice of bread.  1 six-inch tortilla.  3 oz (85 g) cooked rice or pasta.  4 oz (113 g) cooked dried beans.  4 oz (113 g) starchy vegetable, such as peas, corn, or potatoes.  4 oz (113 g) hot cereal.  4 oz (113 g) mashed potatoes or  of a large baked potato.  4 oz (113 g) canned or frozen fruit.  4 oz (120 mL) fruit juice.  4-6 crackers.  6 chicken nuggets.  6 oz (170 g) unsweetened dry cereal.  6 oz (170 g) plain  fat-free yogurt or yogurt sweetened with artificial sweeteners.  8 oz (240 mL) milk.  8 oz (170 g) fresh fruit or one small piece of fruit.  24 oz (680 g) popped popcorn.  Example of carbohydrate counting Sample meal  3 oz (85 g) chicken breast.  6 oz (170 g) brown rice.  4 oz (113 g) corn.  8 oz (240 mL) milk.  8 oz (170 g) strawberries with sugar-free whipped topping. Carbohydrate calculation 1. Identify the foods that contain carbohydrates: ? Rice. ? Corn. ? Milk. ? Strawberries. 2. Calculate how many servings you have of each food: ? 2 servings rice. ? 1 serving corn. ? 1 serving milk. ? 1 serving strawberries. 3. Multiply each number of servings by 15 g: ? 2 servings rice x 15 g = 30 g. ? 1 serving corn x 15 g = 15 g. ? 1 serving milk x 15 g = 15 g. ? 1 serving strawberries x 15 g = 15 g. 4. Add together all of the amounts to find the total grams of carbohydrates eaten: ? 30 g + 15 g + 15 g + 15 g = 75 g of carbohydrates total. This information is not intended to replace advice given to you by your health care provider. Make sure you discuss any questions you have with your health care provider. Document Released: 12/31/2004 Document Revised: 07/21/2015 Document Reviewed: 06/14/2015 Elsevier Interactive Patient Education  [employer].

## 2016-08-27 NOTE — Progress Notes (Signed)
Subjective:    Patient ID: Erin Lawson, female    DOB: [birth year], 56 y.o.   MRN: [id number]  08/27/2016  Pain (x 2 weeks on the left side of body.) and Blurred Vision (x 2 weeks with headache)   HPI This 56 y.o. female presents for evaluation of L sided body pain and blurred vision with headache.  Also presenting to establish care.  For the last two weeks, had a charlie horse in  RIGHT calf; ulable to get out of bed.  Screaming so badly. Now in R posterior thigh having pain.  Hard to walk.  Headache also; R periorbital region with pain.  Blood shot red in R eye.  Wokes up last week with vision loss.  Glasses are old; last eye exam was 2014.  Eyes are just blurry; can hardly read a book; now unable to read a book; has not been able to take a test; bought a magnifying glass today to read.  Cannot read ingredients on box or can.  Worried about a stroke.  On LEFT side of breast, feels tingling in pulling.  No breast exam because afraid of what might find; requesting mammogram.   R periorbital region painful.   Wears glasses.  Has been checking BP at home; running good for patient; 155-170/83-85.  Has been prescribed medication in the past but it was not effective; Atenolol which caused pin needles in heart.  Has been taking Sundial organic wood and root tonic; feeling really good. Ordered Tumeric also.  Taking Alkeseltzer pain and cold which helps with R sided pain on headache.  Takes pain away.  Bought Tumeric for R leg pain.  Coworker recommended tumeric.  Has been taking Sundial for past two months.  Works night shift.  Goes to sleep 730am; wakes up 1:00pm; no waking up to urinate.    BP Readings from Last 3 Encounters:  08/27/16 (!) 168/95  12/01/15 (!) 168/98  11/08/15 180/63   Wt Readings from Last 3 Encounters:  08/27/16 144 lb (65.3 kg)  12/01/15 150 lb (68 kg)  11/08/15 155 lb (70.3 kg)    There is no immunization history on file for this patient.  Review of Systems    Constitutional: Negative for activity change, appetite change, chills, diaphoresis, fatigue, fever and unexpected weight change.  HENT: Negative for congestion, dental problem, drooling, ear discharge, ear pain, facial swelling, hearing loss, mouth sores, nosebleeds, postnasal drip, rhinorrhea, sinus pressure, sneezing, sore throat, tinnitus, trouble swallowing and voice change.   Eyes: Positive for visual disturbance. Negative for photophobia, pain, discharge, redness and itching.  Respiratory: Negative for apnea, cough, choking, chest tightness, shortness of breath, wheezing and stridor.   Cardiovascular: Negative for chest pain, palpitations and leg swelling.  Gastrointestinal: Negative for abdominal distention, abdominal pain, anal bleeding, blood in stool, constipation, diarrhea, nausea, rectal pain and vomiting.  Endocrine: Negative for cold intolerance, heat intolerance, polydipsia, polyphagia and polyuria.  Genitourinary: Negative for decreased urine volume, difficulty urinating, dyspareunia, dysuria, enuresis, flank pain, frequency, genital sores, hematuria, menstrual problem, pelvic pain, urgency, vaginal bleeding, vaginal discharge and vaginal pain.  Musculoskeletal: Positive for myalgias. Negative for arthralgias, back pain, gait problem, joint swelling, neck pain and neck stiffness.  Skin: Negative for color change, pallor, rash and wound.  Allergic/Immunologic: Negative for environmental allergies, food allergies and immunocompromised state.  Neurological: Positive for headaches. Negative for dizziness, tremors, seizures, syncope, facial asymmetry, speech difficulty, weakness, light-headedness and numbness.  Hematological: Negative for adenopathy. Does not bruise/bleed easily.  Psychiatric/Behavioral: Negative for agitation, behavioral problems, confusion, decreased concentration, dysphoric mood, hallucinations, self-injury, sleep disturbance and suicidal ideas. The patient is not  nervous/anxious and is not hyperactive.     Past Medical History:  Diagnosis Date  . DM type 2 (diabetes mellitus, type 2) ([place]) 05/18/2012  . Hypertension    Past Surgical History:  Procedure Laterality Date  . CESAREAN SECTION    . utreine polyp     resected   Allergies  Allergen Reactions  . Atenolol     REACTION: made me feel bad  . Pork-Derived Products Other (See Comments)    Migraine    Current Outpatient Prescriptions  Medication Sig Dispense Refill  . metFORMIN (GLUCOPHAGE) 500 MG tablet Take 1 tablet (500 mg total) by mouth 2 (two) times daily with a meal. 60 tablet 1   No current facility-administered medications for this visit.    Social History   Social History  . Marital status: Single    Spouse name: N/A  . Number of children: N/A  . Years of education: N/A   Occupational History  . cna     [employer]   Social History Main Topics  . Smoking status: Never Smoker  . Smokeless tobacco: Never Used  . Alcohol use No  . Drug use: No  . Sexual activity: Not on file   Other Topics Concern  . 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 Hypertension Sister   . Heart attack Neg Hx        Objective:    BP (!) 168/95   Pulse 72   Temp 98 F (36.7 C) (Oral)   Resp 16   Ht 5' 0.63" (1.54 m)   Wt 144 lb (65.3 kg)   SpO2 99%   BMI 27.54 kg/m  Physical Exam  Constitutional: She is oriented to person, place, and time. She appears well-developed and well-nourished. No distress.  HENT:  Head: Normocephalic and atraumatic.  Right Ear: External ear normal.  Left Ear: External ear normal.  Nose:  Nose normal.  Mouth/Throat: Oropharynx is clear and moist.  Eyes: Pupils are equal, round, and reactive to light. Conjunctivae and EOM are normal.  Neck: Normal range of motion and full passive range of motion without pain. Neck supple. No JVD present. Carotid bruit is not present. No thyromegaly present.  Cardiovascular: Normal rate, regular rhythm, normal heart sounds and intact distal pulses.  Exam reveals no gallop and no friction rub.   No murmur heard. Pulmonary/Chest: Effort normal and breath sounds normal. She has no wheezes. She has no rales. Right breast exhibits no inverted nipple, no mass, no nipple discharge, no skin change and no tenderness. Left breast exhibits no inverted nipple, no mass, no nipple discharge, no skin change and no tenderness. Breasts are symmetrical.  Abdominal: Soft. Bowel sounds are normal. She exhibits no distension and no mass. There is no tenderness. There is no rebound and no guarding.  Musculoskeletal:  Right shoulder: Normal.       Left shoulder: Normal.       Cervical back: Normal.  Lymphadenopathy:    She has no cervical adenopathy.  Neurological: She is alert and oriented to person, place, and time. She has normal reflexes. No cranial nerve deficit. She exhibits normal muscle tone. Coordination normal.  Skin: Skin is warm and dry. No rash noted. She is not diaphoretic. No erythema. No pallor.  Psychiatric: She has a normal mood and affect. Her behavior is normal. Judgment and thought content normal.  Nursing note and vitals reviewed.  EKG: NSR; lateral ischemia; T wave inversion.  Unchanged from 2015.   No results found. Depression screen [facility] 2/9 08/27/2016 12/01/2015  Decreased Interest 0 0  Down, Depressed, Hopeless 0 0  PHQ - 2 Score 0 0   Fall Risk  08/27/2016 12/01/2015  Falls in the past year? No No        Assessment & Plan:   1. Other headache syndrome   2. Blurred vision   3. Leg cramps   4. Shortness of breath   5.  Essential hypertension   6. Type 2 diabetes mellitus without complication, without long-term current use of insulin (HCC)   7. Breast pain, left   8. Acute non intractable tension-type headache    -new onset headache with blurred vision, leg cramps.  Blood pressure elevated and sugar has been elevated, which is likely source of symptoms.  Due to uncontrolled hypertension and DMII for years, warrants further work up with CT head or MRI brain to rule out intracranial process.   -also suffering with SOB and leg cramps.  Obtain EKG; obtain labs.  To ED for acute worsening. -suffering with breast pain as well. Benign breast exam in office; refer for mammogram after addressing more acute issues. -pt very reluctant to initiate medication for HTN and DMII; agreeable to Metformin 500mg  bid.  Close follow-up; to ED for acute worsening symptoms; pt expressed understanding. -EKG abnormal yet no change from 2015.    Orders Placed This Encounter  Procedures  . CT Head Wo Contrast    Wt-144/ no needs/ bcbs/ ls and pt w/ epic order    Standing Status:   Future    Standing Expiration Date:   11/27/2017    Order Specific Question:   Is patient pregnant?    Answer:   No    Order Specific Question:   Preferred imaging location?    Answer:   [id number]    Order Specific Question:   Radiology Contrast Protocol - do NOT remove file path    Answer:   [email or web address]  . CBC with Differential/Platelet  . Comprehensive metabolic panel  . TSH  . POCT glucose (manual entry)  . POCT glycosylated hemoglobin (Hb A1C)  . POCT urinalysis dipstick  . POCT urine pregnancy  . EKG 12-Lead   Meds ordered this encounter  Medications  . metFORMIN (GLUCOPHAGE) 500 MG tablet    Sig: Take 1 tablet (500 mg total) by mouth 2 (two) times daily with a meal.    Dispense:  60 tablet    Refill:  1    Return in about 2 weeks (around 09/10/2016) for recheck blood sugar, high blood  pressure.   Bandy Honaker Elayne Guerin, M.D. Primary Care at [facility] previously [facility] [place] [address] [place], [place]  [address] [phone] phone [phone] fax

## 2016-08-28 LAB — CBC WITH DIFFERENTIAL/PLATELET
BASOS ABS: 0 10*3/uL (ref 0.0–0.2)
Basos: 0 %
EOS (ABSOLUTE): 0.1 10*3/uL (ref 0.0–0.4)
Eos: 1 %
Hematocrit: 38.1 % (ref 34.0–46.6)
Hemoglobin: 12.3 g/dL (ref 11.1–15.9)
IMMATURE GRANS (ABS): 0 10*3/uL (ref 0.0–0.1)
IMMATURE GRANULOCYTES: 0 %
LYMPHS: 44 %
Lymphocytes Absolute: 4.1 10*3/uL — ABNORMAL HIGH (ref 0.7–3.1)
MCH: 22 pg — AB (ref 26.6–33.0)
MCHC: 32.3 g/dL (ref 31.5–35.7)
MCV: 68 fL — ABNORMAL LOW (ref 79–97)
Monocytes Absolute: 0.5 10*3/uL (ref 0.1–0.9)
Monocytes: 6 %
NEUTROS PCT: 49 %
Neutrophils Absolute: 4.6 10*3/uL (ref 1.4–7.0)
PLATELETS: 387 10*3/uL — AB (ref 150–379)
RBC: 5.6 x10E6/uL — AB (ref 3.77–5.28)
RDW: 14.8 % (ref 12.3–15.4)
WBC: 9.4 10*3/uL (ref 3.4–10.8)

## 2016-08-28 LAB — COMPREHENSIVE METABOLIC PANEL
ALT: 26 IU/L (ref 0–32)
AST: 21 IU/L (ref 0–40)
Albumin/Globulin Ratio: 1.7 (ref 1.2–2.2)
Albumin: 4.6 g/dL (ref 3.5–5.5)
Alkaline Phosphatase: 161 IU/L — ABNORMAL HIGH (ref 39–117)
BILIRUBIN TOTAL: 0.5 mg/dL (ref 0.0–1.2)
BUN/Creatinine Ratio: 19 (ref 9–23)
BUN: 14 mg/dL (ref 6–24)
CALCIUM: 10.2 mg/dL (ref 8.7–10.2)
CHLORIDE: 97 mmol/L (ref 96–106)
CO2: 23 mmol/L (ref 20–29)
Creatinine, Ser: 0.72 mg/dL (ref 0.57–1.00)
GFR calc non Af Amer: 93 mL/min/{1.73_m2} (ref >59)
GFR, EST AFRICAN AMERICAN: 108 mL/min/{1.73_m2} (ref >59)
GLUCOSE: 198 mg/dL — AB (ref 65–99)
Globulin, Total: 2.7 g/dL (ref 1.5–4.5)
Potassium: 4.3 mmol/L (ref 3.5–5.2)
Sodium: 136 mmol/L (ref 134–144)
Total Protein: 7.3 g/dL (ref 6.0–8.5)

## 2016-08-28 LAB — TSH: TSH: 1.96 u[IU]/mL (ref 0.450–4.500)

## 2016-09-10 ENCOUNTER — Ambulatory Visit
Admission: RE | Admit: 2016-09-10 | Discharge: 2016-09-10 | Disposition: A | Discharge status: Home / Self Care | Payer: BLUE CROSS/BLUE SHIELD | Source: Ambulatory Visit | Attending: Family Medicine | Admitting: Family Medicine

## 2016-09-10 DIAGNOSIS — G44209 Tension-type headache, unspecified, not intractable: Secondary | ICD-10-CM

## 2016-09-10 DIAGNOSIS — E119 Type 2 diabetes mellitus without complications: Secondary | ICD-10-CM

## 2016-09-10 DIAGNOSIS — H538 Other visual disturbances: Secondary | ICD-10-CM

## 2016-09-10 DIAGNOSIS — G4489 Other headache syndrome: Secondary | ICD-10-CM

## 2016-09-10 DIAGNOSIS — I10 Essential (primary) hypertension: Secondary | ICD-10-CM

## 2016-09-16 NOTE — Progress Notes (Signed)
Subjective:    Patient ID: Erin Lawson, female    DOB: [birth year], 56 y.o.   MRN: [id number]  09/17/2016  Diabetes (2 week follow-up) and Hypertension   HPI This 56 y.o. female presents for evaluation of DMII, hypertension. Feeling much better.  LEFT foot with cold numbness.  Still having some headaches mild and intermittent.  Still having pulling sensation in posterior thigh RIGHT.    $2500 deductible.   CT scan $500.  Taking Metformin 548m bid; having diarrhea with Metformin several times per day.  If eats something, must run to the bathroom.  No nausea.  Getting a little better.  3-4 times per day.  Taking Metformin with a meal.  Stopped eating beef after last visit.   Wants to lose 20 pounds by January 2019. Signed up for aerobic class at senior center facility.   Will start going on Tuesday and Thursdays.   B: yogurt with nuts and orange/strawberries or packet of oatmeal OR eggs, toasted bagel with cream cheese and jelly. Snack: none Lunch:  Leftovers from dinner (salad with others) Snack: none Supper: no beef, salad or greens, fish (tuna, whitings) or chicken.  Not eating a lot of meat.  Allergic to pork.    Headache is not as frequent; R sided headache; must take Alkeseltzer; taking once per week; less frequent; before last visit, was taking every day.   BP Readings from Last 3 Encounters:  09/17/16 (!) 152/82  08/27/16 (!) 168/95  12/01/15 (!) 168/98   Wt Readings from Last 3 Encounters:  09/17/16 147 lb (66.7 kg)  08/27/16 144 lb (65.3 kg)  12/01/15 150 lb (68 kg)    There is no immunization history on file for this patient.  Review of Systems  Constitutional: Negative for chills, diaphoresis, fatigue and fever.  Eyes: Positive for visual disturbance. Negative for photophobia, pain and redness.  Respiratory: Negative for cough and shortness of breath.   Cardiovascular: Negative for chest pain, palpitations and leg swelling.  Gastrointestinal: Positive  for diarrhea. Negative for abdominal pain, constipation, nausea and vomiting.  Endocrine: Negative for cold intolerance, heat intolerance, polydipsia, polyphagia and polyuria.  Genitourinary: Negative for frequency and urgency.  Musculoskeletal: Positive for myalgias.  Neurological: Positive for headaches. Negative for dizziness, tremors, seizures, syncope, facial asymmetry, speech difficulty, weakness, light-headedness and numbness.    Past Medical History:  Diagnosis Date  . DM type 2 (diabetes mellitus, type 2) ([place] 05/18/2012  . Hypertension    Past Surgical History:  Procedure Laterality Date  . CESAREAN SECTION    . utreine polyp     resected   Allergies  Allergen Reactions  . Atenolol     REACTION: made me feel bad  . Pork-Derived Products Other (See Comments)    Migraine     Social History   Social History  . Marital status: Single    Spouse name: N/A  . Number of children: N/A  . Years of education: N/A   Occupational History  . cna     [employer]  Social History Main Topics  . Smoking status: Never Smoker  . Smokeless tobacco: Never Used  . Alcohol use No  . Drug use: No  . Sexual activity: Not on file   Other Topics Concern  . 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 Hypertension Sister   . Heart attack Neg Hx        Objective:    BP (!) 152/82   Pulse 65   Temp 98 F (36.7 C) (Oral)   Resp 16   Ht 5' 0.63" (1.54 m)   Wt 147 lb (66.7 kg)   SpO2 99%   BMI 28.12 kg/m  Physical Exam  Constitutional: She is oriented to person, place, and time. She appears well-developed and  well-nourished. No distress.  HENT:  Head: Normocephalic and atraumatic.  Right Ear: External ear normal.  Left Ear: External ear normal.  Nose: Nose normal.  Mouth/Throat: Oropharynx is clear and moist.  Eyes: Pupils are equal, round, and reactive to light. Conjunctivae and EOM are normal.  Neck: Normal range of motion. Neck supple. Carotid bruit is not present. No thyromegaly present.  Cardiovascular: Normal rate, regular rhythm, normal heart sounds and intact distal pulses.  Exam reveals no gallop and no friction rub.   No murmur heard. Pulmonary/Chest: Effort normal and breath sounds normal. She has no wheezes. She has no rales.  Abdominal: Soft. Bowel sounds are normal. She exhibits no distension and no mass. There is no tenderness. There is no rebound and no guarding.  Lymphadenopathy:    She has no cervical adenopathy.  Neurological: She is alert and oriented to person, place, and time. No cranial nerve deficit. She exhibits normal muscle tone. Coordination normal.  Skin: Skin is warm and dry. No rash noted. She is not diaphoretic. No erythema. No pallor.  Psychiatric: She has a normal mood and affect. Her behavior is normal. Judgment and thought content normal.   Results for orders placed or performed in visit on 09/17/16  POCT glucose (manual entry)  Result Value Ref Range   POC Glucose 83 70 - 99 mg/dl   No results found. Depression screen [facility] 2/9 09/17/2016 08/27/2016 12/01/2015  Decreased Interest 0 0 0  Down, Depressed, Hopeless 0 0 0  PHQ - 2 Score 0 0 0   Fall Risk  09/17/2016 08/27/2016 12/01/2015  Falls in the past year? No No No        Assessment & Plan:   1. Type 2 diabetes mellitus without complication, without long-term current use of insulin ([place])   2. Essential hypertension   3. Acute non intractable tension-type headache   4. Screening for breast cancer    -new onset DMII uncontrolled yet greatly improved with Metformin and dietary modification; refer for  diabetic nutrition education.  Rx fro glucometer provided; to check sugar 1-2 times per day. - I recommend weight loss, exercise, and low-carbohydrate low-sugar food choices. You should AVOID: regular sodas, sweetened tea, fruit juices.  You should LIMIT: breads, pastas, rice, potatoes, and desserts/sweets.  I would recommend limiting your total carbohydrate intake per meal to 45 grams; I would limit your total carbohydrate intake per snack to 30 grams.  I would also have a goal of 60 grams of protein intake per day; this would equal 10-15 grams of protein per meal and 5-10 grams of protein per snack. -uncontrolled hypertension; will start Amlodipine 39m daily start with 1/2 daily  for two weeks and then increase to one daily; will avoid HCTZ/diuretic or ACE while sugars are elevated. -refer for mammogram. -headaches are improving with improved glycemic control; s/p CT head negative for acute process. If headaches persists, obtain MRI brain and refer to neurology.  Will treat HTN with Amlodipine and expect headaches to improve further.     Orders Placed This Encounter  Procedures  . MM DIGITAL SCREENING BILATERAL    Standing Status:   Future    Standing Expiration Date:   11/17/2017    Order Specific Question:   Reason for Exam (SYMPTOM  OR DIAGNOSIS REQUIRED)    Answer:   screening for breast cancer    Order Specific Question:   Is the patient pregnant?    Answer:   No    Order Specific Question:   Preferred imaging location?    Answer:   [facility]  . Ambulatory referral to diabetic education    Referral Priority:   Routine    Referral Type:   Consultation    Referral Reason:   Specialty Services Required    Number of Visits Requested:   1  . POCT glucose (manual entry)   Meds ordered this encounter  Medications  . blood glucose meter kit and supplies KIT    Sig: Dispense based on patient and insurance preference. Use up to four times daily as directed. (FOR ICD-9 250.00, 250.01).      Dispense:  1 each    Refill:  0    PLEASE DISPENSE GLUCOMETER THAT INSURANCE PREFERS WITH TEST STRIPS AND LANCETS THAT INSURANCE PREFERS DX: DMII UNCONTROLLED NON-INSULIN.    Order Specific Question:   Number of strips    Answer:   100    Order Specific Question:   Number of lancets    Answer:   100  . amLODipine (NORVASC) 5 MG tablet    Sig: Take 1 tablet (5 mg total) by mouth daily.    Dispense:  30 tablet    Refill:  2    Return in about 4 weeks (around 10/15/2016) for recheck blood pressure, diabetes, weight.   Adianna Darwin Elayne Guerin, M.D. Primary Care at [facility] previously [facility] [place] [address] [place], [place]  [address] [phone] phone [phone] fax

## 2016-09-17 ENCOUNTER — Encounter: Payer: Self-pay | Admitting: Family Medicine

## 2016-09-17 ENCOUNTER — Ambulatory Visit (INDEPENDENT_AMBULATORY_CARE_PROVIDER_SITE_OTHER): Payer: BLUE CROSS/BLUE SHIELD | Admitting: Family Medicine

## 2016-09-17 VITALS — BP 152/82 | HR 65 | Temp 98.0°F | Resp 16 | Ht 60.63 in | Wt 147.0 lb

## 2016-09-17 DIAGNOSIS — Z1231 Encounter for screening mammogram for malignant neoplasm of breast: Secondary | ICD-10-CM

## 2016-09-17 DIAGNOSIS — E119 Type 2 diabetes mellitus without complications: Secondary | ICD-10-CM

## 2016-09-17 DIAGNOSIS — Z1239 Encounter for other screening for malignant neoplasm of breast: Secondary | ICD-10-CM

## 2016-09-17 DIAGNOSIS — G44209 Tension-type headache, unspecified, not intractable: Secondary | ICD-10-CM | POA: Diagnosis not present

## 2016-09-17 DIAGNOSIS — I10 Essential (primary) hypertension: Secondary | ICD-10-CM

## 2016-09-17 LAB — GLUCOSE, POCT (MANUAL RESULT ENTRY): POC Glucose: 83 mg/dl (ref 70–99)

## 2016-09-17 MED ORDER — BLOOD GLUCOSE MONITOR KIT: PACK | 0 refills | Status: DC

## 2016-09-17 MED ORDER — AMLODIPINE BESYLATE 5 MG PO TABS: 5.0000 mg | ORAL_TABLET | Freq: Every day | ORAL | 2 refills | Status: DC

## 2016-09-17 NOTE — Patient Instructions (Addendum)
I recommend weight loss, exercise, and low-carbohydrate low-sugar food choices. You should AVOID: regular sodas, sweetened tea, fruit juices.    You should LIMIT: breads, pastas, rice, potatoes, and desserts/sweets.    I would recommend limiting your total carbohydrate intake per meal to 30-45 grams; I would limit your total carbohydrate intake per snack to 30 grams.    I would also have a goal of 60 grams of protein intake per day; this would equal 10-15 grams of protein per meal and 5-10 grams of protein per snack.  CHECK SUGAR TWICE DAILY --- FIRST THING UPON AWAKENING AND THEN 2 HOURS AFTER LARGEST MEAL; KEEP A RECORD.  Start Amlodipine 5mg  1/2 tablet daily for two weeks for blood pressure and then increase to one tablet daily (goal blood pressure is 120/70).   Continue Metformin 500mg  one pill twice daily.   Carbohydrate Counting for Diabetes Mellitus, Adult Carbohydrate counting is a method for keeping track of how many carbohydrates you eat. Eating carbohydrates naturally increases the amount of sugar (glucose) in the blood. Counting how many carbohydrates you eat helps keep your blood glucose within normal limits, which helps you manage your diabetes (diabetes mellitus). It is important to know how many carbohydrates you can safely have in each meal. This is different for every person. A diet and nutrition specialist (registered dietitian) can help you make a meal plan and calculate how many carbohydrates you should have at each meal and snack. Carbohydrates are found in the following foods:  Grains, such as breads and cereals.  Dried beans and soy products.  Starchy vegetables, such as potatoes, peas, and corn.  Fruit and fruit juices.  Milk and yogurt.  Sweets and snack foods, such as cake, cookies, candy, chips, and soft drinks.  How do I count carbohydrates? There are two ways to count carbohydrates in food. You can use either of the methods or a combination of  both. Reading "Nutrition Facts" on packaged food The "Nutrition Facts" list is included on the labels of almost all packaged foods and beverages in the [place] It includes:  The serving size.  Information about nutrients in each serving, including the grams (g) of carbohydrate per serving.  To use the "Nutrition Facts":  Decide how many servings you will have.  Multiply the number of servings by the number of carbohydrates per serving.  The resulting number is the total amount of carbohydrates that you will be having.  Learning standard serving sizes of other foods When you eat foods containing carbohydrates that are not packaged or do not include "Nutrition Facts" on the label, you need to measure the servings in order to count the amount of carbohydrates:  Measure the foods that you will eat with a food scale or measuring cup, if needed.  Decide how many standard-size servings you will eat.  Multiply the number of servings by 15. Most carbohydrate-rich foods have about 15 g of carbohydrates per serving. ? For example, if you eat 8 oz (170 g) of strawberries, you will have eaten 2 servings and 30 g of carbohydrates (2 servings x 15 g = 30 g).  For foods that have more than one food mixed, such as soups and casseroles, you must count the carbohydrates in each food that is included.  The following list contains standard serving sizes of common carbohydrate-rich foods. Each of these servings has about 15 g of carbohydrates:   hamburger bun or  English muffin.   oz (15 mL) syrup.  oz (14 g) jelly.  1 slice of bread.  1 six-inch tortilla.  3 oz (85 g) cooked rice or pasta.  4 oz (113 g) cooked dried beans.  4 oz (113 g) starchy vegetable, such as peas, corn, or potatoes.  4 oz (113 g) hot cereal.  4 oz (113 g) mashed potatoes or  of a large baked potato.  4 oz (113 g) canned or frozen fruit.  4 oz (120 mL) fruit juice.  4-6 crackers.  6 chicken nuggets.  6 oz  (170 g) unsweetened dry cereal.  6 oz (170 g) plain fat-free yogurt or yogurt sweetened with artificial sweeteners.  8 oz (240 mL) milk.  8 oz (170 g) fresh fruit or one small piece of fruit.  24 oz (680 g) popped popcorn.  Example of carbohydrate counting Sample meal  3 oz (85 g) chicken breast.  6 oz (170 g) brown rice.  4 oz (113 g) corn.  8 oz (240 mL) milk.  8 oz (170 g) strawberries with sugar-free whipped topping. Carbohydrate calculation 1. Identify the foods that contain carbohydrates: ? Rice. ? Corn. ? Milk. ? Strawberries. 2. Calculate how many servings you have of each food: ? 2 servings rice. ? 1 serving corn. ? 1 serving milk. ? 1 serving strawberries. 3. Multiply each number of servings by 15 g: ? 2 servings rice x 15 g = 30 g. ? 1 serving corn x 15 g = 15 g. ? 1 serving milk x 15 g = 15 g. ? 1 serving strawberries x 15 g = 15 g. 4. Add together all of the amounts to find the total grams of carbohydrates eaten: ? 30 g + 15 g + 15 g + 15 g = 75 g of carbohydrates total. This information is not intended to replace advice given to you by your health care provider. Make sure you discuss any questions you have with your health care provider. Document Released: 12/31/2004 Document Revised: 07/21/2015 Document Reviewed: 06/14/2015 Elsevier Interactive Patient Education  2018 [employer].  IF you received an x-ray today, you will receive an invoice from [facility]. Please contact [facility] at [phone] with questions or concerns regarding your invoice.   IF you received labwork today, you will receive an invoice from [facility]. Please contact [facility] at [phone] with questions or concerns regarding your invoice.   Our billing staff will not be able to assist you with questions regarding bills from these companies.  You will be contacted with the lab results as soon as they are available. The fastest way to get your results is to  activate your My Chart account. Instructions are located on the last page of this paperwork. If you have not heard from [name] regarding the results in 2 weeks, please contact this office.

## 2016-09-24 ENCOUNTER — Other Ambulatory Visit: Payer: Self-pay

## 2016-09-24 DIAGNOSIS — E119 Type 2 diabetes mellitus without complications: Secondary | ICD-10-CM

## 2016-09-24 MED ORDER — BLOOD GLUCOSE MONITOR KIT: PACK | 0 refills | Status: DC

## 2016-10-16 ENCOUNTER — Other Ambulatory Visit: Payer: Self-pay | Admitting: Family Medicine

## 2016-10-17 ENCOUNTER — Other Ambulatory Visit: Payer: Self-pay | Admitting: Family Medicine

## 2016-10-21 ENCOUNTER — Ambulatory Visit: Payer: BLUE CROSS/BLUE SHIELD | Admitting: Family Medicine

## 2016-11-11 ENCOUNTER — Encounter: Payer: Self-pay | Admitting: Family Medicine

## 2016-11-11 ENCOUNTER — Ambulatory Visit (INDEPENDENT_AMBULATORY_CARE_PROVIDER_SITE_OTHER): Payer: BLUE CROSS/BLUE SHIELD | Admitting: Family Medicine

## 2016-11-11 VITALS — BP 142/78 | HR 78 | Temp 98.0°F | Resp 16 | Ht 61.81 in | Wt 150.0 lb

## 2016-11-11 DIAGNOSIS — G4452 New daily persistent headache (NDPH): Secondary | ICD-10-CM | POA: Diagnosis not present

## 2016-11-11 DIAGNOSIS — I10 Essential (primary) hypertension: Secondary | ICD-10-CM | POA: Diagnosis not present

## 2016-11-11 DIAGNOSIS — E119 Type 2 diabetes mellitus without complications: Secondary | ICD-10-CM

## 2016-11-11 DIAGNOSIS — R1110 Vomiting, unspecified: Secondary | ICD-10-CM | POA: Diagnosis not present

## 2016-11-11 LAB — POCT URINALYSIS DIP (MANUAL ENTRY)
BILIRUBIN UA: NEGATIVE
BILIRUBIN UA: NEGATIVE mg/dL
GLUCOSE UA: NEGATIVE mg/dL
Nitrite, UA: NEGATIVE
Protein Ur, POC: NEGATIVE mg/dL
RBC UA: NEGATIVE
SPEC GRAV UA: 1.025 (ref 1.010–1.025)
Urobilinogen, UA: 0.2 E.U./dL
pH, UA: 5 (ref 5.0–8.0)

## 2016-11-11 LAB — GLUCOSE, POCT (MANUAL RESULT ENTRY): POC Glucose: 106 mg/dl — AB (ref 70–99)

## 2016-11-11 MED ORDER — LANCETS ULTRA FINE MISC: 5 refills | Status: DC

## 2016-11-11 NOTE — Patient Instructions (Signed)
     IF you received an x-ray today, you will receive an invoice from [facility]. Please contact [facility] at [phone] with questions or concerns regarding your invoice.   IF you received labwork today, you will receive an invoice from [facility]. Please contact [facility] at [phone] with questions or concerns regarding your invoice.   Our billing staff will not be able to assist you with questions regarding bills from these companies.  You will be contacted with the lab results as soon as they are available. The fastest way to get your results is to activate your My Chart account. Instructions are located on the last page of this paperwork. If you have not heard from us regarding the results in 2 weeks, please contact this office.     

## 2016-11-11 NOTE — Progress Notes (Signed)
Subjective:    Patient ID: Erin Lawson, female    DOB: Feb 20, 1960, 56 y.o.   MRN: [id number]  11/11/2016  Diabetes (1 month follow-up) and Hypertension    HPI This 56 y.o. female presents for evaluation of diabetes and hypertension.  Management changes made at last visit included: -new onset DMII uncontrolled yet greatly improved with Metformin and dietary modification; refer for diabetic nutrition education.  Rx fro glucometer provided; to check sugar 1-2 times per day. - I recommend weight loss, exercise, and low-carbohydrate low-sugar food choices. You should AVOID: regular sodas, sweetened tea, fruit juices.  You should LIMIT: breads, pastas, rice, potatoes, and desserts/sweets.  I would recommend limiting your total carbohydrate intake per meal to 45 grams; I would limit your total carbohydrate intake per snack to 30 grams.  I would also have a goal of 60 grams of protein intake per day; this would equal 10-15 grams of protein per meal and 5-10 grams of protein per snack. -uncontrolled hypertension; will start Amlodipine 65m daily start with 1/2 daily for two weeks and then increase to one daily; will avoid HCTZ/diuretic or ACE while sugars are elevated. -refer for mammogram. -headaches are improving with improved glycemic control; s/p CT head negative for acute process. If headaches persists, obtain MRI brain and refer to neurology.  Will treat HTN with Amlodipine and expect headaches to improve further.    Feeling much better. Vision is much better.  Gave a prescription for new glasses; unable to get prescription; R eye pressure. Needs a repeat pressure reading in R eye; pressure 24.   Nutrition and education class; unable to get class in November. Every Thursday for three weeks. Exercise not doing. Just got test strips last week; had glucometer three weeks ago.  Unable to check sugars; no lancets.  112/83-132/79.  Taking Metformin twice daily at 8:00am and 5:00pm; eats at  work at 5:00pm.  If eats beef, will have diarrhea and vomiting.  Burning in throat; tastes food in mouth.  Episodes x Saturday night with chicken.  No heartburn.  Just started when started the medication.     BP Readings from Last 3 Encounters:  11/11/16 (!) 142/78  09/17/16 (!) 152/82  08/27/16 (!) 168/95   Wt Readings from Last 3 Encounters:  11/11/16 150 lb (68 kg)  09/17/16 147 lb (66.7 kg)  08/27/16 144 lb (65.3 kg)    There is no immunization history on file for this patient.  Review of Systems  Constitutional: Negative for chills, diaphoresis, fatigue and fever.  Eyes: Negative for visual disturbance.  Respiratory: Negative for cough and shortness of breath.   Cardiovascular: Negative for chest pain, palpitations and leg swelling.  Gastrointestinal: Positive for vomiting. Negative for abdominal pain, constipation, diarrhea and nausea.  Endocrine: Negative for cold intolerance, heat intolerance, polydipsia, polyphagia and polyuria.  Neurological: Negative for dizziness, tremors, seizures, syncope, facial asymmetry, speech difficulty, weakness, light-headedness, numbness and headaches.    Past Medical History:  Diagnosis Date  . DM type 2 (diabetes mellitus, type 2) ([place] 05/18/2012  . Hypertension    Past Surgical History:  Procedure Laterality Date  . CESAREAN SECTION    . utreine polyp     resected   Allergies  Allergen Reactions  . Atenolol     REACTION: made me feel bad  . Pork-Derived Products Other (See Comments)    Migraine    Current Outpatient Prescriptions on File Prior to Visit  Medication Sig Dispense Refill  . amLODipine (NORVASC)  5 MG tablet Take 1 tablet (5 mg total) by mouth daily. 30 tablet 2  . blood glucose meter kit and supplies KIT Dispense based on patient and insurance preference. Use up to four times daily as directed. (FOR ICD-9 250.00, 250.01). 1 each 0  . CONTOUR NEXT TEST test strip USE UP TO 4 TIMES DAILY AS DIRECTED 100 each 0  .  metFORMIN (GLUCOPHAGE) 500 MG tablet TAKE 1 TABLET (500 MG TOTAL) BY MOUTH 2 (TWO) TIMES DAILY WITH A MEAL. 60 tablet 1   No current facility-administered medications on file prior to visit.    Social History   Social History  . Marital status: Single    Spouse name: N/A  . Number of children: N/A  . Years of education: N/A   Occupational History  . cna     [place]   Social History Main Topics  . Smoking status: Never Smoker  . Smokeless tobacco: Never Used  . Alcohol use No  . Drug use: No  . Sexual activity: Not on file   Other Topics Concern  . 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 Hypertension Sister   . Heart attack Neg Hx        Objective:    BP (!) 142/78   Pulse 78   Temp 98 F (36.7 C) (Oral)   Resp 16   Ht 5' 1.81" (1.57 m)   Wt 150 lb (68 kg)   SpO2 98%   BMI 27.60 kg/m  Physical Exam  Constitutional: She is oriented to person, place, and time. She appears well-developed and well-nourished. No distress.  HENT:  Head: Normocephalic and atraumatic.  Right Ear: External ear normal.  Left Ear: External ear normal.  Nose: Nose normal.  Mouth/Throat: Oropharynx is clear and moist.  Eyes: Pupils are equal, round, and reactive to light. Conjunctivae and EOM are normal.  Neck: Normal range of motion. Neck supple. Carotid bruit is not present. No thyromegaly present.  Cardiovascular: Normal rate, regular rhythm, normal heart sounds and intact distal pulses.  Exam reveals no gallop and no friction rub.   No murmur heard. Pulmonary/Chest: Effort normal and breath sounds normal. She has no  wheezes. She has no rales.  Abdominal: Soft. Bowel sounds are normal. She exhibits no distension and no mass. There is no tenderness. There is no rebound and no guarding.  Lymphadenopathy:    She has no cervical adenopathy.  Neurological: She is alert and oriented to person, place, and time. No cranial nerve deficit.  Skin: Skin is warm and dry. No rash noted. She is not diaphoretic. No erythema. No pallor.  Psychiatric: She has a normal mood and affect. Her behavior is normal.   No results found. Depression screen [facility] 2/9 11/11/2016 09/17/2016 08/27/2016 12/01/2015  Decreased Interest 0 0 0 0  Down, Depressed, Hopeless 0 0 0 0  PHQ - 2 Score 0 0 0 0   Fall Risk  11/11/2016 09/17/2016 08/27/2016 12/01/2015  Falls in the past year? No No No No   Results for orders placed or performed in visit on 11/11/16  Comprehensive metabolic panel  Result Value Ref Range   Glucose 109 (H) 65 - 99 mg/dL   BUN 13 6 - 24 mg/dL   Creatinine, Ser 0.62 0.57 - 1.00 mg/dL   GFR calc non Af Amer 100 >59 mL/min/1.73   GFR calc Af Amer 116 >59 mL/min/1.73   BUN/Creatinine Ratio 21 9 - 23   Sodium 141 134 - 144 mmol/L   Potassium 4.6 3.5 - 5.2 mmol/L   Chloride 102 96 - 106 mmol/L   CO2 20 20 - 29 mmol/L   Calcium 10.6 (H) 8.7 - 10.2 mg/dL   Total Protein 7.5 6.0 - 8.5 g/dL   Albumin 4.9 3.5 - 5.5 g/dL   Globulin, Total 2.6 1.5 - 4.5 g/dL   Albumin/Globulin Ratio 1.9 1.2 - 2.2   Bilirubin Total 0.3 0.0 - 1.2 mg/dL   Alkaline Phosphatase 107 39 - 117 IU/L   AST 19 0 - 40 IU/L   ALT 24 0 - 32 IU/L  Microalbumin / creatinine urine ratio  Result Value Ref Range   Creatinine, Urine 113.1 Not Estab. mg/dL   Albumin, Urine 5.3 Not Estab. ug/mL   Microalb/Creat Ratio 4.7 0.0 - 30.0 mg/g creat  POCT glucose (manual entry)  Result Value Ref Range   POC Glucose 106 (A) 70 - 99 mg/dl  POCT urinalysis dipstick  Result Value Ref Range   Color, UA yellow yellow   Clarity, UA clear clear   Glucose, UA negative  negative mg/dL   Bilirubin, UA negative negative   Ketones, POC UA negative negative mg/dL   Spec Grav, UA 1.025 1.010 - 1.025   Blood, UA negative negative   pH, UA 5.0 5.0 - 8.0   Protein Ur, POC negative negative mg/dL   Urobilinogen, UA 0.2 0.2 or 1.0 E.U./dL   Nitrite, UA Negative Negative   Leukocytes, UA Moderate (2+) (A) Negative        Assessment & Plan:   1. Type 2 diabetes mellitus without complication, without long-term current use of insulin ([facility])   2. Essential hypertension   3. Postprandial vomiting   4. New daily persistent headache    -improved DMII: education on glucometer use provided during visit; has upcoming diabetic education and nutrition counseling.   -hypertension improved yet not at goal; no changes to management made today; continue current medication. -new onset postprandial vomiting when eats chicken or beef; onset after starting Metformin; if plans to eat beef or chicken, HOLD metformin for that meal.   -headaches have resolved; vision has improved; upcoming appointment with Dr. Velvet Bathe office.    Orders Placed This Encounter  Procedures  . Comprehensive metabolic panel  . Microalbumin / creatinine urine ratio  . POCT glucose (manual entry)  . POCT urinalysis dipstick   Meds ordered this encounter  Medications  . LANCETS ULTRA FINE MISC    Sig: Check sugar up to four times daily dx: new onset DMII uncontrolled non-insulin    Dispense:  100 each    Refill:  5    Dispense: for contour next EZ glucometer    Return in about 6 weeks (around 12/23/2016) for recheck DIABETES, HIGH BLOOD PRESSURE .   Erin Lawson Elayne Guerin, M.D. Primary Care at [facility] [address] [place], [place]  [address] [phone] phone [phone] fax

## 2016-11-12 LAB — COMPREHENSIVE METABOLIC PANEL
A/G RATIO: 1.9 (ref 1.2–2.2)
ALBUMIN: 4.9 g/dL (ref 3.5–5.5)
ALT: 24 IU/L (ref 0–32)
AST: 19 IU/L (ref 0–40)
Alkaline Phosphatase: 107 IU/L (ref 39–117)
BILIRUBIN TOTAL: 0.3 mg/dL (ref 0.0–1.2)
BUN / CREAT RATIO: 21 (ref 9–23)
BUN: 13 mg/dL (ref 6–24)
CALCIUM: 10.6 mg/dL — AB (ref 8.7–10.2)
CHLORIDE: 102 mmol/L (ref 96–106)
CO2: 20 mmol/L (ref 20–29)
Creatinine, Ser: 0.62 mg/dL (ref 0.57–1.00)
GFR, EST AFRICAN AMERICAN: 116 mL/min/{1.73_m2} (ref >59)
GFR, EST NON AFRICAN AMERICAN: 100 mL/min/{1.73_m2} (ref >59)
Globulin, Total: 2.6 g/dL (ref 1.5–4.5)
Glucose: 109 mg/dL — ABNORMAL HIGH (ref 65–99)
POTASSIUM: 4.6 mmol/L (ref 3.5–5.2)
Sodium: 141 mmol/L (ref 134–144)
TOTAL PROTEIN: 7.5 g/dL (ref 6.0–8.5)

## 2016-11-12 LAB — MICROALBUMIN / CREATININE URINE RATIO
Creatinine, Urine: 113.1 mg/dL
MICROALB/CREAT RATIO: 4.7 mg/g{creat} (ref 0.0–30.0)
Microalbumin, Urine: 5.3 ug/mL

## 2016-11-28 ENCOUNTER — Ambulatory Visit: Payer: BLUE CROSS/BLUE SHIELD | Admitting: Registered"

## 2016-11-29 ENCOUNTER — Other Ambulatory Visit: Payer: Self-pay | Admitting: Family Medicine

## 2016-12-12 ENCOUNTER — Ambulatory Visit: Payer: BLUE CROSS/BLUE SHIELD | Admitting: Registered"

## 2016-12-12 ENCOUNTER — Encounter: Payer: Self-pay | Admitting: Dietician

## 2016-12-12 ENCOUNTER — Encounter: Payer: BLUE CROSS/BLUE SHIELD | Attending: Family Medicine | Admitting: Dietician

## 2016-12-12 DIAGNOSIS — Z713 Dietary counseling and surveillance: Secondary | ICD-10-CM | POA: Diagnosis not present

## 2016-12-12 DIAGNOSIS — E119 Type 2 diabetes mellitus without complications: Secondary | ICD-10-CM | POA: Insufficient documentation

## 2016-12-12 NOTE — Progress Notes (Signed)

## 2016-12-15 ENCOUNTER — Other Ambulatory Visit: Payer: Self-pay | Admitting: Family Medicine

## 2016-12-19 ENCOUNTER — Encounter: Payer: BLUE CROSS/BLUE SHIELD | Attending: Family Medicine | Admitting: Dietician

## 2016-12-19 DIAGNOSIS — E119 Type 2 diabetes mellitus without complications: Secondary | ICD-10-CM | POA: Insufficient documentation

## 2016-12-19 DIAGNOSIS — Z713 Dietary counseling and surveillance: Secondary | ICD-10-CM | POA: Diagnosis not present

## 2016-12-19 NOTE — Progress Notes (Signed)
Patient was seen on 12/19/16 for the second of a series of three diabetes self-management courses at [facility]. The following learning objectives were met by the patient during this class:   Describe the role of different macronutrients on glucose  Explain how carbohydrates affect blood glucose  State what foods contain the most carbohydrates  Demonstrate carbohydrate counting  Demonstrate how to read Nutrition Facts food label  Describe effects of various fats on heart health  Describe the importance of good nutrition for health and healthy eating strategies  Describe techniques for managing your shopping, cooking and meal planning  List strategies to follow meal plan when dining out  Describe the effects of alcohol on glucose and how to use it safely  Goals:  Follow Diabetes Meal Plan as instructed  Aim to spread carbs evenly throughout the day  Aim for 3 meals per day and snacks as needed Include lean protein foods to meals/snacks  Monitor glucose levels as instructed by your doctor   Follow-Up Plan:  Attend Core 3  Work towards following your personal food plan.

## 2016-12-26 ENCOUNTER — Encounter: Payer: BLUE CROSS/BLUE SHIELD | Admitting: *Deleted

## 2016-12-26 DIAGNOSIS — E119 Type 2 diabetes mellitus without complications: Secondary | ICD-10-CM

## 2016-12-26 NOTE — Progress Notes (Signed)
Patient was seen on 12/26/2016 for the third of a series of three diabetes self-management courses at the [facility].   [place]ina [place] the amount of activity recommended for healthy living . Describe activities suitable for individual needs . Identify ways to regularly incorporate activity into daily life . Identify barriers to activity and ways to over come these barriers  Identify diabetes medications being personally used and their primary action for lowering glucose and possible side effects . Describe role of stress on blood glucose and develop strategies to address psychosocial issues . Identify diabetes complications and ways to prevent them  Explain how to manage diabetes during illness . Evaluate success in meeting personal goal . Establish 2-3 goals that they will plan to diligently work on until they return for the  58-month follow-up visit  Goals:   I will be active 15 minutes or more 5 times a week  Your patient has identified these potential barriers to change:  Work / time  Your patient has identified their diabetes self-care support plan as  On-line Resources   Plan:  Attend Support Group as desired

## 2016-12-30 ENCOUNTER — Encounter: Payer: Self-pay | Admitting: Family Medicine

## 2016-12-30 ENCOUNTER — Other Ambulatory Visit: Payer: Self-pay

## 2016-12-30 ENCOUNTER — Ambulatory Visit: Payer: BLUE CROSS/BLUE SHIELD | Admitting: Family Medicine

## 2016-12-30 VITALS — BP 150/70 | HR 115 | Temp 98.0°F | Resp 16 | Ht 61.02 in | Wt 151.0 lb

## 2016-12-30 DIAGNOSIS — I10 Essential (primary) hypertension: Secondary | ICD-10-CM | POA: Diagnosis not present

## 2016-12-30 DIAGNOSIS — E119 Type 2 diabetes mellitus without complications: Secondary | ICD-10-CM

## 2016-12-30 DIAGNOSIS — M7989 Other specified soft tissue disorders: Secondary | ICD-10-CM | POA: Diagnosis not present

## 2016-12-30 DIAGNOSIS — Z114 Encounter for screening for human immunodeficiency virus [HIV]: Secondary | ICD-10-CM | POA: Diagnosis not present

## 2016-12-30 DIAGNOSIS — Z1159 Encounter for screening for other viral diseases: Secondary | ICD-10-CM | POA: Diagnosis not present

## 2016-12-30 LAB — POCT GLYCOSYLATED HEMOGLOBIN (HGB A1C): Hemoglobin A1C: 7.1

## 2016-12-30 LAB — GLUCOSE, POCT (MANUAL RESULT ENTRY): POC GLUCOSE: 134 mg/dL — AB (ref 70–99)

## 2016-12-30 MED ORDER — AMLODIPINE BESYLATE 5 MG PO TABS: 5.0000 mg | ORAL_TABLET | Freq: Every day | ORAL | 1 refills | Status: DC

## 2016-12-30 MED ORDER — METFORMIN HCL 500 MG PO TABS: 500.0000 mg | ORAL_TABLET | Freq: Two times a day (BID) | ORAL | 1 refills | Status: DC

## 2016-12-30 NOTE — Patient Instructions (Addendum)
IF you received an x-ray today, you will receive an invoice from [facility]. Please contact [facility] at [phone] with questions or concerns regarding your invoice.   IF you received labwork today, you will receive an invoice from [place]. Please contact [facility] at [phone] with questions or concerns regarding your invoice.   Our billing staff will not be able to assist you with questions regarding bills from these companies.  You will be contacted with the lab results as soon as they are available. The fastest way to get your results is to activate your My Chart account. Instructions are located on the last page of this paperwork. If you have not heard from [name] regarding the results in 2 weeks, please contact this office.      Diabetes and Foot Care Diabetes may cause you to have problems because of poor blood supply (circulation) to your feet and legs. This may cause the skin on your feet to become thinner, break easier, and heal more slowly. Your skin may become dry, and the skin may peel and crack. You may also have nerve damage in your legs and feet causing decreased feeling in them. You may not notice minor injuries to your feet that could lead to infections or more serious problems. Taking care of your feet is one of the most important things you can do for yourself. Follow these instructions at home:  Wear shoes at all times, even in the house. Do not go barefoot. Bare feet are easily injured.  Check your feet daily for blisters, cuts, and redness. If you cannot see the bottom of your feet, use a mirror or ask someone for help.  Wash your feet with warm water (do not use hot water) and mild soap. Then pat your feet and the areas between your toes until they are completely dry. Do not soak your feet as this can dry your skin.  Apply a moisturizing lotion or petroleum jelly (that does not contain alcohol and is unscented) to the skin on your feet and  to dry, brittle toenails. Do not apply lotion between your toes.  Trim your toenails straight across. Do not dig under them or around the cuticle. File the edges of your nails with an emery board or nail file.  Do not cut corns or calluses or try to remove them with medicine.  Wear clean socks or stockings every day. Make sure they are not too tight. Do not wear knee-high stockings since they may decrease blood flow to your legs.  Wear shoes that fit properly and have enough cushioning. To break in new shoes, wear them for just a few hours a day. This prevents you from injuring your feet. Always look in your shoes before you put them on to be sure there are no objects inside.  Do not cross your legs. This may decrease the blood flow to your feet.  If you find a minor scrape, cut, or break in the skin on your feet, keep it and the skin around it clean and dry. These areas may be cleansed with mild soap and water. Do not cleanse the area with peroxide, alcohol, or iodine.  When you remove an adhesive bandage, be sure not to damage the skin around it.  If you have a wound, look at it several times a day to make sure it is healing.  Do not use heating pads or hot water bottles. They may burn your skin. If you have lost  feeling in your feet or legs, you may not know it is happening until it is too late.  Make sure your health care provider performs a complete foot exam at least annually or more often if you have foot problems. Report any cuts, sores, or bruises to your health care provider immediately. Contact a health care provider if:  You have an injury that is not healing.  You have cuts or breaks in the skin.  You have an ingrown nail.  You notice redness on your legs or feet.  You feel burning or tingling in your legs or feet.  You have pain or cramps in your legs and feet.  Your legs or feet are numb.  Your feet always feel cold. Get help right away if:  There is increasing  redness, swelling, or pain in or around a wound.  There is a red line that goes up your leg.  Pus is coming from a wound.  You develop a fever or as directed by your health care provider.  You notice a bad smell coming from an ulcer or wound. This information is not intended to replace advice given to you by your health care provider. Make sure you discuss any questions you have with your health care provider. Document Released: 12/29/1999 Document Revised: 06/08/2015 Document Reviewed: 06/09/2012 Elsevier Interactive Patient Education  2017 [employer]  

## 2016-12-30 NOTE — Progress Notes (Signed)
Subjective:    Patient ID: Erin Lawson, female    DOB: 03/22/1960, 56 y.o.   MRN: [id number]  12/30/2016  Diabetes (6 week follow-up ) and Hypertension    HPI This 56 y.o. female presents for TWO MONTH FOLLOW-UP evaluation of DMII and hypertension. Fasting sugars 118-120. After eating, sugar 211.   Metformin 523m once daily.  Due to n/v, only taking once daily.   Took diabetic nutrition counseling.   Eating at night was a problem; very happy with class. Learned how to count carbohydrates.  Only can eat 30 grams of carbohydrates per meal.  Does not recall snack limitations. Sometimes not hungry for snack. Works a lot so with patient at night.  Yogurt or apple or orange or banana.  Recall on Amlodipine so stopped Amlodipine.  Unable to horrbily walk.  Had to hold onto something.  Could feel B thighs pulling.  LEFT knee pain: onset over the weekend.  Posterior knee pain and swelling; radiates into posterior leg.  No numbness or tingling; pulling sensation.   Worried about Parkinson's disease.   BP Readings from Last 3 Encounters:  12/30/16 (!) 150/70  11/11/16 (!) 142/78  09/17/16 (!) 152/82   Wt Readings from Last 3 Encounters:  12/30/16 151 lb (68.5 kg)  11/11/16 150 lb (68 kg)  09/17/16 147 lb (66.7 kg)    There is no immunization history on file for this patient.  Review of Systems  Constitutional: Negative for chills, diaphoresis, fatigue and fever.  Eyes: Negative for visual disturbance.  Respiratory: Negative for cough and shortness of breath.   Cardiovascular: Negative for chest pain, palpitations and leg swelling.  Gastrointestinal: Negative for abdominal pain, constipation, diarrhea, nausea and vomiting.  Endocrine: Negative for cold intolerance, heat intolerance, polydipsia, polyphagia and polyuria.  Musculoskeletal: Positive for arthralgias and myalgias.  Neurological: Negative for dizziness, tremors, seizures, syncope, facial asymmetry, speech  difficulty, weakness, light-headedness, numbness and headaches.    Past Medical History:  Diagnosis Date  . DM type 2 (diabetes mellitus, type 2[place]  . Hypertension    Past Surgical History:  Procedure Laterality Date  . CESAREAN SECTION    . utreine polyp     resected   Allergies  Allergen Reactions  . Atenolol Other (See Comments)    REACTION: made me feel bad REACTION: made me feel bad  . Poractant Alfa Other (See Comments)    Migraine   . Pork-Derived Products Other (See Comments)    Migraine    Current Outpatient Medications on File Prior to Visit  Medication Sig Dispense Refill  . blood glucose meter kit and supplies KIT Dispense based on patient and insurance preference. Use up to four times daily as directed. (FOR ICD-9 250.00, 250.01). 1 each 0  . glucose blood (CONTOUR NEXT TEST) test strip Check sugar once daily  Dx:  DMII controlled non-insulin dependent. 100 each 3  . LANCETS ULTRA FINE MISC Check sugar up to four times daily dx: new onset DMII uncontrolled non-insulin 100 each 5   No current facility-administered medications on file prior to visit.    Social History   Socioeconomic History  . Marital status: Single    Spouse name: Not on file  . Number of children: Not on file  . Years of education: Not on file  . Highest education level: Not on file  Social Needs  . Financial resource strain: Not on file  . Food insecurity - worry: Not on file  . Food  insecurity - inability: Not on file  . Transportation needs - medical: Not on file  . Transportation needs - non-medical: Not on file  Occupational History  . Occupation: cna    Comment: [employer]  Tobacco Use  . Smoking status: Never Smoker  . Smokeless tobacco: Never Used  Substance and Sexual Activity  . Alcohol use: No  . Drug use: No  . Sexual activity: Not on file  Other Topics Concern  . 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 Hypertension Sister   . Heart attack Neg Hx        Objective:    BP (!) 150/70 (BP Location: Left Arm, Cuff Size: Large)   Pulse (!) 115   Temp 98 F (36.7 C) (Oral)   Resp 16   Ht 5' 1.02" (1.55 m)   Wt 151 lb (68.5 kg)   SpO2 98%   BMI 28.51 kg/m  Physical Exam  Constitutional: She is oriented to person, place, and time. She appears well-developed and well-nourished. No distress.  HENT:  Head: Normocephalic and atraumatic.  Right Ear: External ear normal.  Left Ear: External ear normal.  Nose: Nose normal.  Mouth/Throat: Oropharynx is clear and moist.  Eyes: Conjunctivae and EOM are normal. Pupils are equal, round, and reactive to light.  Neck: Normal range of motion. Neck supple. Carotid bruit is not present. No thyromegaly present.  Cardiovascular: Normal rate, regular rhythm, normal heart sounds and intact distal pulses. Exam reveals no gallop and no friction rub.  No murmur heard. Pulmonary/Chest: Effort normal and breath sounds normal. She has no wheezes. She has no rales.  Abdominal: Soft. Bowel sounds are normal. She exhibits no distension and no mass. There is no tenderness. There is no rebound and no guarding.  Musculoskeletal:       Right hip: Normal.       Right knee: She exhibits swelling. She exhibits normal range of motion.       Left knee: Normal.       Right ankle: Normal.       Lumbar back: Normal. She exhibits normal range of motion, no tenderness, no bony tenderness, no swelling, no pain, no spasm and normal pulse.       Right lower leg: Normal.  Lymphadenopathy:    She has no cervical adenopathy.  Neurological: She is alert and oriented to  person, place, and time. No cranial nerve deficit.  Skin: Skin is warm and dry. No rash noted. She is not diaphoretic. No erythema. No pallor.  Psychiatric: She has a normal mood and affect. Her behavior is normal.   No results found. Depression screen [facility] 2/9 12/30/2016 12/12/2016 11/11/2016 09/17/2016 08/27/2016  Decreased Interest 0 0 0 0 0  Down, Depressed, Hopeless 0 0 0 0 0  PHQ - 2 Score 0 0 0 0 0   Fall Risk  12/30/2016 12/12/2016 11/11/2016 09/17/2016 08/27/2016  Falls in the past year? No No No No No  Assessment & Plan:   1. Type 2 diabetes mellitus without complication, without long-term current use of insulin ([place])   2. Essential hypertension   3. Leg swelling   4. Encounter for hepatitis C screening test for low risk patient   5. Screening for HIV (human immunodeficiency virus)     Improved glycemic control with hemoglobin A1c of 7.1 today.  Congratulations on success.  Refill of metformin provided for 1 tablet twice daily.  Blood pressure moderately controlled with amlodipine 5 mg daily.  Refill provided.  Obtain labs for chronic disease management.  Suffering with new onset leg swelling particularly of the right leg.  Recommend obtaining right lower extremity Doppler however patient declined.  Very benign exam in office today.  More suggestive of primary right knee pathology or radicular symptoms from lumbar spine.  Patient declines lower back symptoms at this time.  Recommend daily stretching and hydration and observation.  Return to clinic for acute worsening.  Good range of motion of lumbar spine, right hip, right knee, right calf and exam today.   Orders Placed This Encounter  Procedures  . Comprehensive metabolic panel  . Hepatitis C antibody  . HIV antibody  . POCT glycosylated hemoglobin (Hb A1C)  . POCT glucose (manual entry)   Meds ordered this encounter  Medications  . amLODipine (NORVASC) 5 MG tablet    Sig: Take 1 tablet (5 mg total) by mouth  daily.    Dispense:  90 tablet    Refill:  1  . metFORMIN (GLUCOPHAGE) 500 MG tablet    Sig: Take 1 tablet (500 mg total) by mouth 2 (two) times daily with a meal.    Dispense:  180 tablet    Refill:  1    Return in about 3 months (around 03/30/2017) for follow-up chronic medical conditions.   Gali Spinney Elayne Guerin, M.D. Primary [facility] at [facility] [address] [place], [place]  [address] [phone] phone [phone] fax

## 2016-12-31 LAB — COMPREHENSIVE METABOLIC PANEL
A/G RATIO: 2.1 (ref 1.2–2.2)
ALT: 39 IU/L — ABNORMAL HIGH (ref 0–32)
AST: 33 IU/L (ref 0–40)
Albumin: 5 g/dL (ref 3.5–5.5)
Alkaline Phosphatase: 115 IU/L (ref 39–117)
BILIRUBIN TOTAL: 0.4 mg/dL (ref 0.0–1.2)
BUN/Creatinine Ratio: 25 — ABNORMAL HIGH (ref 9–23)
BUN: 14 mg/dL (ref 6–24)
CALCIUM: 10.1 mg/dL (ref 8.7–10.2)
CO2: 21 mmol/L (ref 20–29)
Chloride: 104 mmol/L (ref 96–106)
Creatinine, Ser: 0.56 mg/dL — ABNORMAL LOW (ref 0.57–1.00)
GFR calc Af Amer: 120 mL/min/{1.73_m2} (ref >59)
GFR, EST NON AFRICAN AMERICAN: 104 mL/min/{1.73_m2} (ref >59)
Globulin, Total: 2.4 g/dL (ref 1.5–4.5)
Glucose: 111 mg/dL — ABNORMAL HIGH (ref 65–99)
POTASSIUM: 4.4 mmol/L (ref 3.5–5.2)
Sodium: 142 mmol/L (ref 134–144)
Total Protein: 7.4 g/dL (ref 6.0–8.5)

## 2016-12-31 LAB — HIV ANTIBODY (ROUTINE TESTING W REFLEX): HIV SCREEN 4TH GENERATION: NONREACTIVE

## 2016-12-31 LAB — HEPATITIS C ANTIBODY: Hep C Virus Ab: <0.1 s/co ratio (ref 0.0–0.9)

## 2017-01-29 ENCOUNTER — Telehealth: Payer: Self-pay | Admitting: Family Medicine

## 2017-01-29 NOTE — Telephone Encounter (Signed)
Copied from [place]. Topic: Quick Communication - See Telephone Encounter >> Jan 29, 2017  1:27 PM Cleaster Cor[place], [place] wrote: CRM for notification. See Telephone encounter for:   01/29/17. Pt. Calling had received call but didn't see any notes that someone had called her pt. Can be reached at [phone]

## 2017-03-11 NOTE — Telephone Encounter (Signed)
Incoming call from patient transferred from patient call center. She states she just received a call from [facility] instructing her to call back. RN cannot find record of call. Confirmed next appointment in March, reviewed lab results from December. Patient verbalizes understanding, has no further questions.

## 2017-03-31 ENCOUNTER — Encounter: Payer: Self-pay | Admitting: Family Medicine

## 2017-03-31 ENCOUNTER — Other Ambulatory Visit: Payer: Self-pay

## 2017-03-31 ENCOUNTER — Ambulatory Visit (INDEPENDENT_AMBULATORY_CARE_PROVIDER_SITE_OTHER): Payer: BLUE CROSS/BLUE SHIELD

## 2017-03-31 ENCOUNTER — Ambulatory Visit: Payer: BLUE CROSS/BLUE SHIELD | Admitting: Family Medicine

## 2017-03-31 VITALS — BP 138/70 | HR 78 | Temp 98.0°F | Resp 16 | Ht 60.63 in | Wt 151.0 lb

## 2017-03-31 DIAGNOSIS — E119 Type 2 diabetes mellitus without complications: Secondary | ICD-10-CM

## 2017-03-31 DIAGNOSIS — M1712 Unilateral primary osteoarthritis, left knee: Secondary | ICD-10-CM

## 2017-03-31 DIAGNOSIS — I10 Essential (primary) hypertension: Secondary | ICD-10-CM | POA: Diagnosis not present

## 2017-03-31 DIAGNOSIS — Z1231 Encounter for screening mammogram for malignant neoplasm of breast: Secondary | ICD-10-CM | POA: Diagnosis not present

## 2017-03-31 DIAGNOSIS — Z6828 Body mass index (BMI) 28.0-28.9, adult: Secondary | ICD-10-CM

## 2017-03-31 DIAGNOSIS — M25562 Pain in left knee: Secondary | ICD-10-CM

## 2017-03-31 LAB — GLUCOSE, POCT (MANUAL RESULT ENTRY): POC Glucose: 164 mg/dl — AB (ref 70–99)

## 2017-03-31 LAB — POCT GLYCOSYLATED HEMOGLOBIN (HGB A1C): Hemoglobin A1C: 8.6

## 2017-03-31 MED ORDER — MELOXICAM 7.5 MG PO TABS: 7.5000 mg | ORAL_TABLET | Freq: Every day | ORAL | 0 refills | Status: DC

## 2017-03-31 MED ORDER — METFORMIN HCL ER (MOD) 1000 MG PO TB24: 2000.0000 mg | ORAL_TABLET | Freq: Every day | ORAL | 1 refills | Status: DC

## 2017-03-31 NOTE — Patient Instructions (Addendum)
FRUIT HIGH IN SUGAR INCLUDE: BANANAS, GRAPES, WATERMELON, PINEAPPLE. FRUITS LOWER IN SUGAR: BERRIES (BLUEBERRIES, BLACKBERRIES, STRAWBERRIES, RASPBERRIES). ADD PEANUT BUTTER TO SMOOTHIES.     IF you received an x-ray today, you will receive an invoice from [facility]. Please contact [facility] at [phone] with questions or concerns regarding your invoice.   IF you received labwork today, you will receive an invoice from [facility]. Please contact [facility] at [phone] with questions or concerns regarding your invoice.   Our billing staff will not be able to assist you with questions regarding bills from these companies.  You will be contacted with the lab results as soon as they are available. The fastest way to get your results is to activate your My Chart account. Instructions are located on the last page of this paperwork. If you have not heard from [name] regarding the results in 2 weeks, please contact this office.     Lateral Collateral Knee Ligament Sprain, Phase I Rehab Ask your health care provider which exercises are safe for you. Do exercises exactly as told by your health care provider and adjust them as directed. It is normal to feel mild stretching, pulling, tightness, or discomfort as you do these exercises, but you should stop right away if you feel sudden pain or your pain gets worse.Do not begin these exercises until told by your health care provider. Stretching and range of motion exercises These exercises warm up your muscles and joints and improve the movement and flexibility of your knee. These exercises also help to relieve pain, numbness, and tingling. Exercise A: Heel slide  1. Lie on your back with both knees straight. If this causes back discomfort, bend your healthy knee, placing your foot flat on the floor. 2. Slowly slide your left / right heel back toward your buttocks until you feel a gentle stretch in the front of your knee or  thigh. 3. Hold this position for __________ seconds. 4. Slowly slide your left / right heel to the starting position. Repeat __________ times. Complete this exercise __________ times a day. Strengthening exercises These exercises build strength and endurance in your knee. Endurance is the ability to use your muscles for a long time, even after they get tired. Exercise B: Straight leg raises ( quadriceps) 1. Lie on your back with your left / right leg extended and your other knee bent. 2. Tense the muscles in the front of your left / right thigh. You should see your kneecap slide up or see increased dimpling just above the knee. Your thigh may even shake a bit. 3. Keep these muscles tight as you raise your leg 4-6 inches (10-15 cm) off the floor. Do not let your knee bend. If you cannot do this exercise without bending your knee, tighten the muscles in the front of your left / right thigh but do not lift your leg. 4. Hold this position for __________ seconds. 5. Keep these muscles tense as you lower your leg. 6. Relax the muscles slowly and completely. Repeat __________ times. Complete this exercise __________ times a day. Exercise C: Hamstring curls  If told by your health care provider, do this exercise while wearing ankle weights. Begin with __________ weights. Then increase the weight by 1 lb (0.5 kg) increments. Do not wear ankle weights that are more than __________. 1. Lie on your abdomen with your legs straight. 2. Bend your left / right knee as far as you can comfortably do that. Keep your hips flat on the  surface that is under them. When you bend your knee, bring your foot straight toward your buttock. Do not let it fall in or out. 3. Hold this position for __________ seconds. 4. Slowly lower your leg to the starting position. Repeat __________ times. Complete this exercise __________ times a day. Exercise D: Bridge ( hip extensors) 1. Lie on your back on a firm surface with your  knees bent and your feet flat on the floor. 2. Tighten your buttocks muscles and lift your bottom off the floor until your trunk is level with your thighs. ? Do not arch your back. ? You should feel the muscles working in your buttocks and the back of your thighs. If you do not feel a stretch in these muscles, slide your feet 1-2 inches (2.5-5 cm) farther away from your buttocks. 3. Hold this position for __________ seconds. 4. Slowly lower your hips to the starting position. 5. Let your buttocks muscles relax completely between repetitions. 6. If this exercise is too easy, try doing it with your arms crossed over your chest or by lifting one leg while your bottom is up off the floor. Repeat __________ times. Complete this exercise __________ times a day. Exercise E: Heel raise 1. Stand with your feet shoulder-width apart. 2. Keep your weight spread evenly over the width of your feet while you rise up on your toes. Use a wall or table to steady yourself, but try not to use it very much for support. 3. If this exercise is too easy, shift your weight toward your left / right leg until you feel challenged. 4. Hold this position for __________ seconds. 5. Slowly lower yourself to the starting position. Repeat __________ times. Complete this exercise __________ times a day. This information is not intended to replace advice given to you by your health care provider. Make sure you discuss any questions you have with your health care provider. Document Released: 12/31/2004 Document Revised: 09/07/2015 Document Reviewed: 11/12/2014 Elsevier Interactive Patient Education  2018 [employer].

## 2017-03-31 NOTE — Progress Notes (Signed)
Subjective:    Patient ID: Erin Lawson, female    DOB: [birth year], 57 y.o.   MRN: [id number]  03/31/2017  Diabetes (3 month follow-up ) and Hypertension    HPI This 57 y.o. female presents for THREE MONTH follow-up of DMII and hypertension.  Management changes made at last visit include the following:  Improved glycemic control with hemoglobin A1c of 7.1 today.  Congratulations on success.  Refill of metformin provided for 1 tablet twice daily.  Blood pressure moderately controlled with amlodipine 5 mg daily.  Refill provided.  Obtain labs for chronic disease management.  Suffering with new onset leg swelling particularly of the right leg.  Recommend obtaining right lower extremity Doppler however patient declined.  Very benign exam in office today.  More suggestive of primary right knee pathology or radicular symptoms from lumbar spine.  Patient declines lower back symptoms at this time.  Recommend daily stretching and hydration and observation.  Return to clinic for acute worsening.  Good range of motion of lumbar spine, right hip, right knee, right calf and exam today. Lab results from previous visit: Sugar/glucose is slightly elevated at 111. Hemoglobin A1c is borderline at 7.1. We would like to see that level less than 7.0 to prevent complications from diabetes.  Keep up the great work! Liver and kidney functions are normal. No evidence of HIV or hepatitis C.  UPDATE: Home BP running 120-130.   Smoothies include almond milk, grapes, strawberries, blueberries, banana, oatmeal.  Loves beets with spinach.   Pin L knee: bones are hurting; knees are swelling. S/p L knee xray in 11/08/2015: mild medial and lateral osteoarthritis.  Started liquid and iron with vitamins; also taking calcium magnesium supplement; started one week ago.  Taking dandelion for BP; taking graviola for sugar and blood pressure control.   Scheduled lower extremity doppler at last visit yet  non-compliant with doppler.   Lab Results  Component Value Date   HGBA1C 8.6 03/31/2017   BP Readings from Last 3 Encounters:  03/31/17 138/70  12/30/16 (!) 150/70  11/11/16 (!) 142/78   Wt Readings from Last 3 Encounters:  03/31/17 151 lb (68.5 kg)  12/30/16 151 lb (68.5 kg)  11/11/16 150 lb (68 kg)    There is no immunization history on file for this patient.  Review of Systems  Constitutional: Negative for chills, diaphoresis, fatigue and fever.  Eyes: Negative for visual disturbance.  Respiratory: Negative for cough and shortness of breath.   Cardiovascular: Negative for chest pain, palpitations and leg swelling.  Gastrointestinal: Negative for abdominal pain, constipation, diarrhea, nausea and vomiting.  Endocrine: Negative for cold intolerance, heat intolerance, polydipsia, polyphagia and polyuria.  Musculoskeletal: Positive for arthralgias, gait problem and joint swelling.  Neurological: Negative for dizziness, tremors, seizures, syncope, facial asymmetry, speech difficulty, weakness, light-headedness, numbness and headaches.    Past Medical History:  Diagnosis Date  . DM type 2 (diabetes mellitus, type 2) ([place]) 05/18/2012  . Hypertension    Past Surgical History:  Procedure Laterality Date  . CESAREAN SECTION    . utreine polyp     resected   Allergies  Allergen Reactions  . Atenolol Other (See Comments)    REACTION: made me feel bad REACTION: made me feel bad  . Poractant Alfa Other (See Comments)    Migraine   . Pork-Derived Products Other (See Comments)    Migraine    Current Outpatient Medications on File Prior to Visit  Medication Sig Dispense Refill  . amLODipine (  NORVASC) 5 MG tablet Take 1 tablet (5 mg total) by mouth daily. 90 tablet 1  . blood glucose meter kit and supplies KIT Dispense based on patient and insurance preference. Use up to four times daily as directed. (FOR ICD-9 250.00, 250.01). 1 each 0  . glucose blood (CONTOUR NEXT TEST) test  strip Check sugar once daily  Dx:  DMII controlled non-insulin dependent. 100 each 3  . LANCETS ULTRA FINE MISC Check sugar up to four times daily dx: new onset DMII uncontrolled non-insulin 100 each 5   No current facility-administered medications on file prior to visit.    Social History   Socioeconomic History  . Marital status: Single    Spouse name: Not on file  . Number of children: Not on file  . Years of education: Not on file  . Highest education level: Not on file  Occupational History  . Occupation: cna    Comment: [employer]  Social Needs  . Financial resource strain: Not on file  . Food insecurity:    Worry: Not on file    Inability: Not on file  . Transportation needs:    Medical: Not on file    Non-medical: Not on file  Tobacco Use  . Smoking status: Never Smoker  . Smokeless tobacco: Never Used  Substance and Sexual Activity  . Alcohol use: No  . Drug use: No  . Sexual activity: Not on file  Lifestyle  . Physical activity:    Days per week: Not on file    Minutes per session: Not on file  . Stress: Not on file  Relationships  . Social connections:    Talks on phone: Not on file    Gets together: Not on file    Attends religious service: Not on file    Active member of club or organization: Not on file    Attends meetings of clubs or organizations: Not on file    Relationship status: Not on file  . Intimate partner violence:    Fear of current or ex partner: Not on file    Emotionally abused: Not on file    Physically abused: Not on file    Forced sexual activity: Not on file  Other Topics Concern  . 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 Hypertension Sister   . Heart attack Neg Hx        Objective:    BP 138/70   Pulse 78   Temp 98 F (36.7 C) (Oral)   Resp 16   Ht 5' 0.63" (1.54 m)   Wt 151 lb (68.5 kg)   SpO2 97%   BMI 28.88 kg/m  Physical Exam  Constitutional: She is oriented to person, place, and time. She appears well-developed and well-nourished. No distress.  HENT:  Head: Normocephalic and atraumatic.  Right Ear: External ear normal.  Left Ear: External ear normal.  Nose: Nose normal.  Mouth/Throat: Oropharynx is clear and moist.  Eyes:  Conjunctivae and EOM are normal. Pupils are equal, round, and reactive to light.  Neck: Normal range of motion. Neck supple. Carotid bruit is not present. No thyromegaly present.  Cardiovascular: Normal rate, regular rhythm, normal heart sounds and intact distal pulses. Exam reveals no gallop and no friction rub.  No murmur heard. Pulmonary/Chest: Effort normal and breath sounds normal. She has no wheezes. She has no rales.  Abdominal: Soft. Bowel sounds are normal. She exhibits no distension and no mass. There is no tenderness. There is no rebound and no guarding.  Musculoskeletal:       Left knee: She exhibits decreased range of motion and swelling. Tenderness found.  Lymphadenopathy:    She has no cervical adenopathy.  Neurological: She is alert and oriented to person, place, and time. No cranial nerve deficit.  Skin: Skin is warm and dry. No rash noted. She is not diaphoretic. No erythema. No pallor.  Psychiatric: She has a normal mood and affect. Her behavior is normal.   No results found. Depression screen [facility] 2/9 03/31/2017 12/30/2016 12/12/2016 11/11/2016 09/17/2016  Decreased Interest 0 0 0 0 0  Down, Depressed, Hopeless 0 0 0 0 0  PHQ - 2 Score 0 0 0 0 0   Fall Risk  03/31/2017 12/30/2016 12/12/2016 11/11/2016 09/17/2016  Falls in the past year? No No No No No        Assessment & Plan:   1. Type 2  diabetes mellitus without complication, without long-term current use of insulin ([place])   2. Essential hypertension   3. Encounter for screening mammogram for breast cancer   4. Pain in lateral portion of left knee   5. Primary osteoarthritis of left knee   6. BMI 28.0-28.9,adult    DMII: worsening HgbA1c due to non-compliance with diet; aovid fruit high in sugar; add protein to smoothies.  HTN: moderately controlled; continue current medication.  L knee pain with swelling: with known osteoarthritis of L knee; repeat L knee films; rx for Mobic provided; home exercise program provided as well. If no improvement in 2-4 weeks, refer to ortho.  BMI 28/overweight: Recommend weight loss, exercise for 30-60 minutes five days per week; recommend 1200 kcal restriction per day with a minimum of 60 grams of protein per day.  Eat 3 meals per day. Do not skip meals. Consider having a protein shake as a meal replacement to aid with eliminating meal skipping. Look for products with <220 calories, <7 gm sugar, and 20-30 gm protein.  Eat breakfast within 2 hours of getting up.   Make  your plate non-starchy vegetables,  protein, and  carbohydrates at lunch and dinner.   Aim for at least 64 oz. of calorie-free beverages daily (water, Crystal Light, diet green tea, etc.). Eliminate any sugary beverages such as regular soda, sweet tea, or fruit juice.   Pay attention to hunger and fullness cues.  Stop eating once you feel satisfied; don't wait until you feel full, stuffed, or sick from eating.  Choose lean meats and low fat/fat free dairy products.  Choose foods high in fiber such as fruits, vegetables, and whole grains (brown rice, whole wheat pasta, whole wheat bread, etc.).  Limit foods with added sugar to <7 gm per serving.  Always eat in the kitchen/dining room.  Never eat in the bedroom or in front of the TV.     Orders Placed This Encounter  Procedures  . DG Knee Complete 4 Views Left     Standing Status:  Future    Number of Occurrences:   1    Standing Expiration Date:   03/31/2018    Order Specific Question:   Reason for Exam (SYMPTOM  OR DIAGNOSIS REQUIRED)    Answer:   L knee pain and swelling    Order Specific Question:   Is the patient pregnant?    Answer:   No    Order Specific Question:   Preferred imaging location?    Answer:   External  . Comprehensive metabolic panel  . CBC with Differential/Platelet  . Care order/instruction:    Please recheck BP.    Scheduling Instructions:     Compare with patient's BP meter; check with both machines in same arm.  Marland Kitchen POCT glucose (manual entry)  . POCT glycosylated hemoglobin (Hb A1C)   Meds ordered this encounter  Medications  . metFORMIN (GLUMETZA) 1000 MG (MOD) 24 hr tablet    Sig: Take 2 tablets (2,000 mg total) by mouth daily with breakfast.    Dispense:  180 tablet    Refill:  1  . meloxicam (MOBIC) 7.5 MG tablet    Sig: Take 1 tablet (7.5 mg total) by mouth daily.    Dispense:  30 tablet    Refill:  0    Return in about 3 months (around 07/01/2017) for follow-up chronic medical conditions.   Chantz Montefusco Elayne Guerin, M.D. Primary Care at [facility] [address] [place], [place]  [address] [phone] phone [phone] fax

## 2017-04-01 ENCOUNTER — Encounter: Payer: Self-pay | Admitting: *Deleted

## 2017-04-01 LAB — COMPREHENSIVE METABOLIC PANEL
ALBUMIN: 4.8 g/dL (ref 3.5–5.5)
ALT: 26 IU/L (ref 0–32)
AST: 24 IU/L (ref 0–40)
Albumin/Globulin Ratio: 1.9 (ref 1.2–2.2)
Alkaline Phosphatase: 124 IU/L — ABNORMAL HIGH (ref 39–117)
BUN / CREAT RATIO: 15 (ref 9–23)
BUN: 9 mg/dL (ref 6–24)
Bilirubin Total: 0.2 mg/dL (ref 0.0–1.2)
CALCIUM: 9.7 mg/dL (ref 8.7–10.2)
CO2: 22 mmol/L (ref 20–29)
CREATININE: 0.62 mg/dL (ref 0.57–1.00)
Chloride: 102 mmol/L (ref 96–106)
GFR, EST AFRICAN AMERICAN: 116 mL/min/{1.73_m2} (ref >59)
GFR, EST NON AFRICAN AMERICAN: 100 mL/min/{1.73_m2} (ref >59)
GLOBULIN, TOTAL: 2.5 g/dL (ref 1.5–4.5)
Glucose: 163 mg/dL — ABNORMAL HIGH (ref 65–99)
Potassium: 4.7 mmol/L (ref 3.5–5.2)
SODIUM: 139 mmol/L (ref 134–144)
Total Protein: 7.3 g/dL (ref 6.0–8.5)

## 2017-04-01 LAB — CBC WITH DIFFERENTIAL/PLATELET
BASOS: 0 %
Basophils Absolute: 0 10*3/uL (ref 0.0–0.2)
EOS (ABSOLUTE): 0.1 10*3/uL (ref 0.0–0.4)
EOS: 1 %
HEMOGLOBIN: 11.1 g/dL (ref 11.1–15.9)
Hematocrit: 33.8 % — ABNORMAL LOW (ref 34.0–46.6)
IMMATURE GRANS (ABS): 0 10*3/uL (ref 0.0–0.1)
Immature Granulocytes: 0 %
LYMPHS: 44 %
Lymphocytes Absolute: 4.3 10*3/uL — ABNORMAL HIGH (ref 0.7–3.1)
MCH: 22.7 pg — AB (ref 26.6–33.0)
MCHC: 32.8 g/dL (ref 31.5–35.7)
MCV: 69 fL — AB (ref 79–97)
MONOCYTES: 4 %
Monocytes Absolute: 0.4 10*3/uL (ref 0.1–0.9)
NEUTROS ABS: 5 10*3/uL (ref 1.4–7.0)
Neutrophils: 51 %
Platelets: 380 10*3/uL — ABNORMAL HIGH (ref 150–379)
RBC: 4.88 x10E6/uL (ref 3.77–5.28)
RDW: 14.7 % (ref 12.3–15.4)
WBC: 10 10*3/uL (ref 3.4–10.8)

## 2017-04-02 ENCOUNTER — Telehealth: Payer: Self-pay | Admitting: Family Medicine

## 2017-04-02 NOTE — Telephone Encounter (Signed)
Copied from [place] [phone]. Topic: General - Other >> Apr 02, 2017  9:09 AM Darl Householder, RMA wrote: Reason for CRM: Patient is requesting a new prescription in place of  meloxicam (MOBIC) 7.5 MG tablet due to medication is not covered under insurance, please return pt call

## 2017-04-03 NOTE — Telephone Encounter (Signed)
Phone call to pharmacy. Spoke with Elmo Putt. She states the Mobic is covered under patient's insurance, it is $4.21 for her to pick this medication up.   Phone call to patient. Per signed release, left detailed message with information above. Please call back if any other questions or concerns.

## 2017-04-07 ENCOUNTER — Telehealth: Payer: Self-pay

## 2017-04-07 NOTE — Telephone Encounter (Signed)
Copied from [place] [id number]. Topic: Quick Communication - Office Called Patient >> Apr 07, 2017  1:30 PM Oneta Rack wrote: Relation to pt: self Call back number: [phone] Pharmacy[id number] [place], [place] [phone] (Phone) [phone] (Fax)       Reason for call:  Patient states meloxicam (MOBIC) 7.5 MG tablet is causing upper abdominal pain and states pharmacy advised to d/c and contact pcp, please advise

## 2017-04-07 NOTE — Telephone Encounter (Signed)
Provider, FYI. 

## 2017-04-08 MED ORDER — CELECOXIB 200 MG PO CAPS: 200.0000 mg | ORAL_CAPSULE | Freq: Every day | ORAL | 0 refills | Status: DC | PRN

## 2017-04-08 NOTE — Telephone Encounter (Signed)
Phone call to patient. Per signed release, left detailed message for patient with information from provider below. Patient instructed to call back if any questions.

## 2017-04-08 NOTE — Telephone Encounter (Signed)
I have sent in Celebrex for patient to take instead of Meloxicam.  She can stop Meloxicam.

## 2017-04-08 NOTE — Telephone Encounter (Signed)
Pt is calling for another medication since the Mobic is causing stomach pain, call pt to advise

## 2017-04-14 NOTE — Telephone Encounter (Signed)
Returning call  [phone], would like a call back

## 2017-04-14 NOTE — Telephone Encounter (Signed)
Pt is concerned regarding potential side effectswith celebrex. Pt states that she has read that celebrex has been removed from the market because of cases of stroke and the general black box warnings. Pt would like alternative or would like clarification on potential side effects and benefit vs risk of this medication. Pt is not taking because she is scared of it.

## 2017-04-28 NOTE — Telephone Encounter (Signed)
Please return patient's call: Potential side effects of Celebrex to include heart related issues.  These are the same exact side effects as meloxicam, ibuprofen, naproxen, Aleve, Advil, Motrin.  If she is concerned regarding the side effects, I recommend she discontinue Celebrex.  She should also avoid all other anti-inflammatories listed.  She can take Tylenol for pain yet this will not help with inflammation.

## 2017-04-28 NOTE — Telephone Encounter (Signed)
Phone call to patient. Per signed authorization to leave detailed message, left voicemail stating message from provider below. Please call back if any questions.

## 2017-05-30 ENCOUNTER — Other Ambulatory Visit: Payer: Self-pay | Admitting: Family Medicine

## 2017-05-30 NOTE — Telephone Encounter (Signed)
Celecoxib refill Last OV: Telephone note 04/07/17 Last Refill:04/08/17 #30 capsules No RF [facility] [id number] PCP: Reginia Forts MD

## 2017-06-09 ENCOUNTER — Encounter: Payer: Self-pay | Admitting: Family Medicine

## 2017-07-11 ENCOUNTER — Telehealth: Payer: Self-pay | Admitting: Family Medicine

## 2017-07-11 NOTE — Telephone Encounter (Signed)
Copied from [place] [id number]. Topic: General - Other >> Jul 11, 2017 12:52 PM Lennox Solders wrote: Reason for CRM: pt is calling she needs a new rx contour lancet the other lancet cost to much. [place] [facility]

## 2017-07-12 ENCOUNTER — Other Ambulatory Visit: Payer: Self-pay

## 2017-07-12 ENCOUNTER — Other Ambulatory Visit: Payer: Self-pay | Admitting: Family Medicine

## 2017-07-12 DIAGNOSIS — E119 Type 2 diabetes mellitus without complications: Secondary | ICD-10-CM

## 2017-07-12 MED ORDER — LANCETS ULTRA FINE MISC: 5 refills | Status: DC

## 2017-07-12 NOTE — Telephone Encounter (Signed)
Reordered per pt request for contour lancet per pt request ..

## 2017-07-25 ENCOUNTER — Other Ambulatory Visit: Payer: Self-pay | Admitting: Family Medicine

## 2017-07-28 ENCOUNTER — Other Ambulatory Visit: Payer: Self-pay | Admitting: Family Medicine

## 2017-08-04 ENCOUNTER — Ambulatory Visit: Payer: BLUE CROSS/BLUE SHIELD | Admitting: Family Medicine

## 2017-08-04 ENCOUNTER — Other Ambulatory Visit: Payer: Self-pay

## 2017-08-04 ENCOUNTER — Encounter: Payer: Self-pay | Admitting: Family Medicine

## 2017-08-04 VITALS — BP 136/82 | HR 82 | Temp 98.7°F | Resp 16 | Ht 60.83 in | Wt 148.0 lb

## 2017-08-04 DIAGNOSIS — E119 Type 2 diabetes mellitus without complications: Secondary | ICD-10-CM | POA: Diagnosis not present

## 2017-08-04 DIAGNOSIS — K219 Gastro-esophageal reflux disease without esophagitis: Secondary | ICD-10-CM | POA: Diagnosis not present

## 2017-08-04 DIAGNOSIS — I10 Essential (primary) hypertension: Secondary | ICD-10-CM

## 2017-08-04 DIAGNOSIS — L80 Vitiligo: Secondary | ICD-10-CM

## 2017-08-04 DIAGNOSIS — L659 Nonscarring hair loss, unspecified: Secondary | ICD-10-CM | POA: Diagnosis not present

## 2017-08-04 LAB — GLUCOSE, POCT (MANUAL RESULT ENTRY): POC Glucose: 139 mg/dl — AB (ref 70–99)

## 2017-08-04 LAB — POCT GLYCOSYLATED HEMOGLOBIN (HGB A1C): HEMOGLOBIN A1C: 8.9 % — AB (ref 4.0–5.6)

## 2017-08-04 MED ORDER — AMLODIPINE BESYLATE 5 MG PO TABS: 5.0000 mg | ORAL_TABLET | Freq: Every day | ORAL | 1 refills | Status: DC

## 2017-08-04 MED ORDER — LANCETS ULTRA FINE MISC: 3 refills | Status: DC

## 2017-08-04 MED ORDER — METFORMIN HCL ER (MOD) 1000 MG PO TB24: 2000.0000 mg | ORAL_TABLET | Freq: Every day | ORAL | 1 refills | Status: DC

## 2017-08-04 MED ORDER — GLUCOSE BLOOD VI STRP: ORAL_STRIP | 3 refills | Status: DC

## 2017-08-04 NOTE — Patient Instructions (Addendum)
     IF you received an x-ray today, you will receive an invoice from [facility]. Please contact [facility] at [phone] with questions or concerns regarding your invoice.   IF you received labwork today, you will receive an invoice from [facility]. Please contact [facility] at [phone] with questions or concerns regarding your invoice.   Our billing staff will not be able to assist you with questions regarding bills from these companies.  You will be contacted with the lab results as soon as they are available. The fastest way to get your results is to activate your My Chart account. Instructions are located on the last page of this paperwork. If you have not heard from [name] regarding the results in 2 weeks, please contact this office.      Food Choices for Gastroesophageal Reflux Disease, Adult When you have gastroesophageal reflux disease (GERD), the foods you eat and your eating habits are very important. Choosing the right foods can help ease your discomfort. What guidelines do I need to follow?  Choose fruits, vegetables, whole grains, and low-fat dairy products.  Choose low-fat meat, fish, and poultry.  Limit fats such as oils, salad dressings, butter, nuts, and avocado.  Keep a food diary. This helps you identify foods that cause symptoms.  Avoid foods that cause symptoms. These may be different for everyone.  Eat small meals often instead of 3 large meals a day.  Eat your meals slowly, in a place where you are relaxed.  Limit fried foods.  Cook foods using methods other than frying.  Avoid drinking alcohol.  Avoid drinking large amounts of liquids with your meals.  Avoid bending over or lying down until 2-3 hours after eating. What foods are not recommended? These are some foods and drinks that may make your symptoms worse: Vegetables Tomatoes. Tomato juice. Tomato and spaghetti sauce. Chili peppers. Onion and garlic.  Horseradish. Fruits Oranges, grapefruit, and lemon (fruit and juice). Meats High-fat meats, fish, and poultry. This includes hot dogs, ribs, ham, sausage, salami, and bacon. Dairy Whole milk and chocolate milk. Sour cream. Cream. Butter. Ice cream. Cream cheese. Drinks Coffee and tea. Bubbly (carbonated) drinks or energy drinks. Condiments Hot sauce. Barbecue sauce. Sweets/Desserts Chocolate and cocoa. Donuts. Peppermint and spearmint. Fats and Oils High-fat foods. This includes [place] fries and potato chips. Other Vinegar. Strong spices. This includes black pepper, white pepper, red pepper, cayenne, curry powder, cloves, ginger, and chili powder. The items listed above may not be a complete list of foods and drinks to avoid. Contact your dietitian for more information. This information is not intended to replace advice given to you by your health care provider. Make sure you discuss any questions you have with your health care provider. Document Released: 07/02/2011 Document Revised: 06/08/2015 Document Reviewed: 11/04/2012 Elsevier Interactive Patient Education  2017 [employer].

## 2017-08-04 NOTE — Progress Notes (Signed)
Subjective:    Patient ID: Erin Lawson, female    DOB: Dec 16, 1960, 57 y.o.   MRN: [id number]  08/04/2017  Chronic Conditions (4 month follow-up)    HPI This 57 y.o. female presents for four month follow-up of DMII, hypertension, L knee pain, overweight.  Management changes made at last visit include the following:  DMII: worsening HgbA1c of 8.6 due to non-compliance with diet; aovid fruit high in sugar; add protein to smoothies. HTN: moderately controlled; continue current medication. L knee pain with swelling: with known osteoarthritis of L knee; repeat L knee films; rx for Mobic provided; home exercise program provided as well. If no improvement in 2-4 weeks, refer to ortho. BMI 28/overweight: Recommend weight loss, exercise for 30-60 minutes five days per week; recommend 1200    UPDATE: Sugars have been low.  Keeping sugars under 200s.   Blood pressure has been good.   Stopping all the research.  More patient researches, the more depressed she becomes.  Fasting sugars 120-180s.  Having reflux with eating; felt like blocking breath/choking; unable to belch.  Went to pharmacy who diagnosed with GERD: bought pharmacy acid reducer earlier this month; Does not think acid reducer is helping.  Was taking alkeseltzer antacid with better results.  Drinking ginger ale which helps.  Bought Control and instrumentation engineer.  Usually happens with dinner.  Hard to burp.  No passing gas.  Cannot pass gas.  Will pass gas with bowel movement.  No NSAIID.  Rare spicy foods.  Does season foods.  Not eating late at night.  Usually eats by 6pm; must be a work at 5-8.  Bedtime 9-10.  Not exercising; tried for three weeks; did get on treadmill; guys started going to gym at apartment complex and got scared to return.   Was taking black seed oil; good for pt.  Then added some neme oil; rubbed in genital area; took at same time took Meloxicam.  Hair loss and hypopigmentation: occurred last month; applied neme oil.  Caused  hair loss.  Stopped immediately.  Washed hair with vinegar.  Rash with meloxicam was on arms and chest.  Rubbed neme oil on scalp and genital area.  L knee pain: much improved; rarely using Celebrex; afraid to use celebrex.     BP Readings from Last 3 Encounters:  08/04/17 136/82  03/31/17 138/70  12/30/16 (!) 150/70   Wt Readings from Last 3 Encounters:  08/04/17 148 lb (67.1 kg)  03/31/17 151 lb (68.5 kg)  12/30/16 151 lb (68.5 kg)    There is no immunization history on file for this patient.  Review of Systems  Constitutional: Negative for activity change, appetite change, chills, diaphoresis, fatigue, fever and unexpected weight change.  HENT: Negative for congestion, dental problem, drooling, ear discharge, ear pain, facial swelling, hearing loss, mouth sores, nosebleeds, postnasal drip, rhinorrhea, sinus pressure, sneezing, sore throat, tinnitus, trouble swallowing and voice change.   Eyes: Negative for photophobia, pain, discharge, redness, itching and visual disturbance.  Respiratory: Negative for apnea, cough, choking, chest tightness, shortness of breath, wheezing and stridor.   Cardiovascular: Negative for chest pain, palpitations and leg swelling.  Gastrointestinal: Negative for abdominal distention, abdominal pain, anal bleeding, blood in stool, constipation, diarrhea, nausea, rectal pain and vomiting.  Endocrine: Negative for cold intolerance, heat intolerance, polydipsia, polyphagia and polyuria.  Genitourinary: Negative for decreased urine volume, difficulty urinating, dyspareunia, dysuria, enuresis, flank pain, frequency, genital sores, hematuria, menstrual problem, pelvic pain, urgency, vaginal bleeding, vaginal discharge and vaginal pain.  Musculoskeletal: Negative for arthralgias, back pain, gait problem, joint swelling, myalgias, neck pain and neck stiffness.  Skin: Positive for color change. Negative for pallor, rash and wound.  Allergic/Immunologic: Negative for  environmental allergies, food allergies and immunocompromised state.  Neurological: Negative for dizziness, tremors, seizures, syncope, facial asymmetry, speech difficulty, weakness, light-headedness, numbness and headaches.  Hematological: Negative for adenopathy. Does not bruise/bleed easily.  Psychiatric/Behavioral: Negative for agitation, behavioral problems, confusion, decreased concentration, dysphoric mood, hallucinations, self-injury, sleep disturbance and suicidal ideas. The patient is not nervous/anxious and is not hyperactive.     Past Medical History:  Diagnosis Date  . DM type 2 (diabetes mellitus, type 2) ([place]) 05/18/2012  . Hypertension    Past Surgical History:  Procedure Laterality Date  . CESAREAN SECTION    . utreine polyp     resected   Allergies  Allergen Reactions  . Atenolol Other (See Comments)    REACTION: made me feel bad REACTION: made me feel bad  . Poractant Alfa Other (See Comments)    Migraine   . Pork-Derived Products Other (See Comments)    Migraine   . Meloxicam Rash   Current Outpatient Medications on File Prior to Visit  Medication Sig Dispense Refill  . blood glucose meter kit and supplies KIT Dispense based on patient and insurance preference. Use up to four times daily as directed. (FOR ICD-9 250.00, 250.01). 1 each 0  . celecoxib (CELEBREX) 200 MG capsule TAKE 1 CAPSULE (200 MG TOTAL) BY MOUTH DAILY AS NEEDED. 30 capsule 0   No current facility-administered medications on file prior to visit.    Social History   Socioeconomic History  . Marital status: Single    Spouse name: Not on file  . Number of children: Not on file  . Years of education: Not on file  . Highest education level: Not on file  Occupational History  . Occupation: cna    Comment: [place]  Social Needs  . Financial resource strain: Not on file  . Food insecurity:    Worry: Not on file    Inability: Not on file  . Transportation  needs:    Medical: Not on file    Non-medical: Not on file  Tobacco Use  . Smoking status: Never Smoker  . Smokeless tobacco: Never Used  Substance and Sexual Activity  . Alcohol use: No  . Drug use: No  . Sexual activity: Not on file  Lifestyle  . Physical activity:    Days per week: Not on file    Minutes per session: Not on file  . Stress: Not on file  Relationships  . Social connections:    Talks on phone: Not on file    Gets together: Not on file    Attends religious service: Not on file    Active member of club or organization: Not on file    Attends meetings of clubs or organizations: Not on file    Relationship status: Not on file  . Intimate partner violence:    Fear of current or ex partner: Not on file    Emotionally abused: Not on file    Physically abused: Not on file    Forced sexual activity: Not on file  Other Topics Concern  . 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 Hypertension Sister   . Heart attack Neg Hx        Objective:    BP 136/82   Pulse 82   Temp 98.7 F (37.1 C) (Oral)   Resp 16   Ht 5' 0.83" (1.545 m)   Wt 148 lb (67.1 kg)   SpO2 96%   BMI 28.12 kg/m  Physical Exam  Constitutional: She is oriented to person, place, and time. She appears well-developed and well-nourished. No distress.  HENT:  Head: Normocephalic and atraumatic.  Right Ear: External ear normal.  Left Ear: External ear normal.  Nose: Nose normal.  Mouth/Throat: Oropharynx is clear and moist.  Eyes: Pupils are equal, round, and reactive to light. Conjunctivae and EOM are normal.  Neck: Normal range of motion and full passive range of  motion without pain. Neck supple. No JVD present. Carotid bruit is not present. No thyromegaly present.  Cardiovascular: Normal rate, regular rhythm, normal heart sounds and intact distal pulses. Exam reveals no gallop and no friction rub.  No murmur heard. Pulmonary/Chest: Effort normal and breath sounds normal. She has no wheezes. She has no rales.  Abdominal: Soft. Bowel sounds are normal. She exhibits no distension and no mass. There is no tenderness. There is no rebound and no guarding.  Musculoskeletal:       Right shoulder: Normal.       Left shoulder: Normal.       Cervical back: Normal.  Lymphadenopathy:    She has no cervical adenopathy.  Neurological: She is alert and oriented to person, place, and time. She has normal reflexes. No cranial nerve deficit. She exhibits normal muscle tone. Coordination normal.  Skin: Skin is warm and dry. No rash noted. She is not diaphoretic. No erythema. No pallor.  Hair loss along scalp associated with mild hypopigmentation..  Loss with hypopigmentation also associated in genital region and suprapubic hair distribution.  Psychiatric: She has a normal mood and affect. Her behavior is normal. Judgment and thought content normal.  Nursing note and vitals reviewed.  No results found. Depression screen [facility] 2/9 08/04/2017 03/31/2017 12/30/2016 12/12/2016 11/11/2016  Decreased Interest 0 0 0 0 0  Down, Depressed, Hopeless 0 0 0 0 0  PHQ - 2 Score 0 0 0 0 0   Fall Risk  08/04/2017 03/31/2017 12/30/2016 12/12/2016 11/11/2016  Falls in the past year? No No No No No   Results for orders placed or performed in visit on 08/04/17  POCT glucose (manual entry)  Result Value Ref Range   POC Glucose 139 (A) 70 - 99 mg/dl  POCT glycosylated hemoglobin (Hb A1C)  Result Value Ref Range   Hemoglobin A1C 8.9 (A) 4.0 - 5.6 %   HbA1c POC (<> result, manual entry)  4.0 - 5.6 %   HbA1c, POC (prediabetic range)  5.7 - 6.4 %   HbA1c, POC (controlled diabetic range)  0.0  - 7.0 %        Assessment & Plan:   1. Type 2 diabetes mellitus without complication, without long-term current use of insulin ([place])   2. Essential hypertension   3. Vitiligo   4. Hair loss   5. Gastroesophageal reflux disease  without esophagitis     Diabetes mellitus type 2 without complications without long-term insulin: Uncontrolled at this time with a hemoglobin A1c of 8.9.  Patient unable to afford nutritional counseling and continues to struggle with diabetic diet.  Counseling provided again today regarding low carbohydrate low sugar food choices.  Goal hemoglobin A1c of less than 7 to prevent complications of diabetes.  Patient refuses to take additional diabetic medication but has been compliant with metformin therapy.  Obtain labs for chronic disease management.  Hypertension: Well-controlled at this time.  No changes to therapy.  Obtain labs.  Hair loss associated with hypopigmentation/vitiligo: New associated with application of topical oil.  Patient has discontinued the use of the topical oil.  Obtain labs including thyroid, iron, vite vitamin D.  Patient declined referral to dermatology.  GERD: New onset.  Due to lifestyle; patient eats at 8 PM and goes to bed at 9 PM.  Recommend eating earlier and avoiding bedtime for 3 hours after eating.  Also recommend having dinner be her smallest meal of the day.  Recommend patient take Zantac 150 mg before supper yet patient resistant to daily medication.  Also recommend ongoing attempts at weight loss.  Discussed and recommended patient elevate the head of her bed.  Patient is currently sleeping on 2 pillows to prevent reflux symptoms.  Orders Placed This Encounter  Procedures  . CBC with Differential/Platelet  . Comprehensive metabolic panel  . Iron  . TSH  . VITAMIN D 25 Hydroxy (Vit-D Deficiency, Fractures)  . Lipid panel  . POCT glucose (manual entry)  . POCT glycosylated hemoglobin (Hb A1C)   Meds ordered this encounter    Medications  . amLODipine (NORVASC) 5 MG tablet    Sig: Take 1 tablet (5 mg total) by mouth daily. Patient needs office visit for more refills    Dispense:  90 tablet    Refill:  1  . metFORMIN (GLUMETZA) 1000 MG (MOD) 24 hr tablet    Sig: Take 2 tablets (2,000 mg total) by mouth daily with breakfast.    Dispense:  180 tablet    Refill:  1  . glucose blood (CONTOUR NEXT TEST) test strip    Sig: Check sugar once daily  Dx:  DMII controlled non-insulin dependent.    Dispense:  100 each    Refill:  3  . LANCETS ULTRA FINE MISC    Sig: Check sugar up one time daily dx: new onset DMII uncontrolled non-insulin    Dispense:  100 each    Refill:  3    Dispense: the contour per the patient.    Return in about 4 months (around 12/05/2017) for follow-up chronic medical conditions STALLINGS.   Carlyne Keehan Elayne Guerin, M.D. Primary Care at [facility] previously [facility] [address] [place], [place]  [address] [phone] phone [phone] fax

## 2017-08-05 ENCOUNTER — Encounter: Payer: Self-pay | Admitting: Family Medicine

## 2017-08-05 LAB — COMPREHENSIVE METABOLIC PANEL
A/G RATIO: 2.3 — AB (ref 1.2–2.2)
ALK PHOS: 113 IU/L (ref 39–117)
ALT: 24 IU/L (ref 0–32)
AST: 24 IU/L (ref 0–40)
Albumin: 4.9 g/dL (ref 3.5–5.5)
BUN/Creatinine Ratio: 26 — ABNORMAL HIGH (ref 9–23)
BUN: 17 mg/dL (ref 6–24)
Bilirubin Total: 0.3 mg/dL (ref 0.0–1.2)
CO2: 21 mmol/L (ref 20–29)
Calcium: 10.4 mg/dL — ABNORMAL HIGH (ref 8.7–10.2)
Chloride: 103 mmol/L (ref 96–106)
Creatinine, Ser: 0.65 mg/dL (ref 0.57–1.00)
GFR calc Af Amer: 113 mL/min/{1.73_m2} (ref >59)
GFR, EST NON AFRICAN AMERICAN: 98 mL/min/{1.73_m2} (ref >59)
GLOBULIN, TOTAL: 2.1 g/dL (ref 1.5–4.5)
Glucose: 140 mg/dL — ABNORMAL HIGH (ref 65–99)
Potassium: 4.2 mmol/L (ref 3.5–5.2)
Sodium: 141 mmol/L (ref 134–144)
Total Protein: 7 g/dL (ref 6.0–8.5)

## 2017-08-05 LAB — CBC WITH DIFFERENTIAL/PLATELET
Basophils Absolute: 0 10*3/uL (ref 0.0–0.2)
Basos: 0 %
EOS (ABSOLUTE): 0.1 10*3/uL (ref 0.0–0.4)
Eos: 1 %
Hematocrit: 33.6 % — ABNORMAL LOW (ref 34.0–46.6)
Hemoglobin: 11 g/dL — ABNORMAL LOW (ref 11.1–15.9)
Immature Grans (Abs): 0 10*3/uL (ref 0.0–0.1)
Immature Granulocytes: 0 %
LYMPHS ABS: 4.5 10*3/uL — AB (ref 0.7–3.1)
LYMPHS: 42 %
MCH: 22.7 pg — ABNORMAL LOW (ref 26.6–33.0)
MCHC: 32.7 g/dL (ref 31.5–35.7)
MCV: 69 fL — AB (ref 79–97)
MONOCYTES: 5 %
MONOS ABS: 0.5 10*3/uL (ref 0.1–0.9)
NEUTROS PCT: 52 %
Neutrophils Absolute: 5.6 10*3/uL (ref 1.4–7.0)
Platelets: 384 10*3/uL (ref 150–450)
RBC: 4.85 x10E6/uL (ref 3.77–5.28)
RDW: 14.9 % (ref 12.3–15.4)
WBC: 10.8 10*3/uL (ref 3.4–10.8)

## 2017-08-05 LAB — IRON: IRON: 68 ug/dL (ref 27–159)

## 2017-08-05 LAB — TSH: TSH: 2.58 u[IU]/mL (ref 0.450–4.500)

## 2017-08-05 LAB — LIPID PANEL
Chol/HDL Ratio: 5.5 ratio — ABNORMAL HIGH (ref 0.0–4.4)
Cholesterol, Total: 182 mg/dL (ref 100–199)
HDL: 33 mg/dL — ABNORMAL LOW (ref >39)
LDL Calculated: 106 mg/dL — ABNORMAL HIGH (ref 0–99)
Triglycerides: 217 mg/dL — ABNORMAL HIGH (ref 0–149)
VLDL Cholesterol Cal: 43 mg/dL — ABNORMAL HIGH (ref 5–40)

## 2017-08-05 LAB — VITAMIN D 25 HYDROXY (VIT D DEFICIENCY, FRACTURES): VIT D 25 HYDROXY: 11.2 ng/mL — AB (ref 30.0–100.0)

## 2017-08-28 ENCOUNTER — Telehealth: Payer: Self-pay | Admitting: Family Medicine

## 2017-08-28 NOTE — Telephone Encounter (Signed)
Called pt. To reschedule appt on 12/08/17. Rescheduled

## 2017-12-08 ENCOUNTER — Ambulatory Visit: Payer: 59 | Admitting: Family Medicine

## 2017-12-08 ENCOUNTER — Other Ambulatory Visit: Payer: Self-pay

## 2017-12-08 ENCOUNTER — Ambulatory Visit: Payer: BLUE CROSS/BLUE SHIELD | Admitting: Family Medicine

## 2017-12-08 ENCOUNTER — Encounter: Payer: Self-pay | Admitting: Family Medicine

## 2017-12-08 VITALS — BP 154/68 | HR 66 | Temp 98.8°F | Resp 17 | Ht 60.08 in | Wt 149.2 lb

## 2017-12-08 DIAGNOSIS — M1712 Unilateral primary osteoarthritis, left knee: Secondary | ICD-10-CM

## 2017-12-08 DIAGNOSIS — D649 Anemia, unspecified: Secondary | ICD-10-CM

## 2017-12-08 DIAGNOSIS — I10 Essential (primary) hypertension: Secondary | ICD-10-CM

## 2017-12-08 DIAGNOSIS — E119 Type 2 diabetes mellitus without complications: Secondary | ICD-10-CM | POA: Diagnosis not present

## 2017-12-08 DIAGNOSIS — Z5181 Encounter for therapeutic drug level monitoring: Secondary | ICD-10-CM

## 2017-12-08 DIAGNOSIS — E559 Vitamin D deficiency, unspecified: Secondary | ICD-10-CM | POA: Diagnosis not present

## 2017-12-08 DIAGNOSIS — R21 Rash and other nonspecific skin eruption: Secondary | ICD-10-CM

## 2017-12-08 LAB — COMPREHENSIVE METABOLIC PANEL
A/G RATIO: 2 (ref 1.2–2.2)
ALT: 18 IU/L (ref 0–32)
AST: 22 IU/L (ref 0–40)
Albumin: 4.7 g/dL (ref 3.5–5.5)
Alkaline Phosphatase: 126 IU/L — ABNORMAL HIGH (ref 39–117)
BILIRUBIN TOTAL: 0.3 mg/dL (ref 0.0–1.2)
BUN/Creatinine Ratio: 18 (ref 9–23)
BUN: 12 mg/dL (ref 6–24)
CO2: 22 mmol/L (ref 20–29)
Calcium: 9.8 mg/dL (ref 8.7–10.2)
Chloride: 101 mmol/L (ref 96–106)
Creatinine, Ser: 0.65 mg/dL (ref 0.57–1.00)
GFR calc Af Amer: 113 mL/min/{1.73_m2} (ref >59)
GFR, EST NON AFRICAN AMERICAN: 98 mL/min/{1.73_m2} (ref >59)
GLOBULIN, TOTAL: 2.4 g/dL (ref 1.5–4.5)
Glucose: 124 mg/dL — ABNORMAL HIGH (ref 65–99)
POTASSIUM: 3.8 mmol/L (ref 3.5–5.2)
SODIUM: 139 mmol/L (ref 134–144)
Total Protein: 7.1 g/dL (ref 6.0–8.5)

## 2017-12-08 LAB — POCT GLYCOSYLATED HEMOGLOBIN (HGB A1C): Hemoglobin A1C: 7.7 % — AB (ref 4.0–5.6)

## 2017-12-08 LAB — POCT CBC
GRANULOCYTE PERCENT: 54 % (ref 37–80)
HEMATOCRIT: 31.5 % (ref 29–41)
Hemoglobin: 10.9 g/dL (ref 9.5–13.5)
Lymph, poc: 3.6 — AB (ref 0.6–3.4)
MCH: 23.8 pg — AB (ref 27–31.2)
MCHC: 34.7 g/dL (ref 31.8–35.4)
MCV: 68.6 fL — AB (ref 76–111)
MID (cbc): 0.6 (ref 0–0.9)
MPV: 6.9 fL (ref 0–99.8)
POC GRANULOCYTE: 4.9 (ref 2–6.9)
POC LYMPH %: 39.9 % (ref 10–50)
POC MID %: 6.1 %M (ref 0–12)
Platelet Count, POC: 388 10*3/uL (ref 142–424)
RBC: 4.59 M/uL (ref 4.04–5.48)
RDW, POC: 14.1 %
WBC: 9.1 10*3/uL (ref 4.6–10.2)

## 2017-12-08 LAB — LIPID PANEL
CHOLESTEROL TOTAL: 158 mg/dL (ref 100–199)
Chol/HDL Ratio: 4.9 ratio — ABNORMAL HIGH (ref 0.0–4.4)
HDL: 32 mg/dL — AB (ref >39)
LDL CALC: 95 mg/dL (ref 0–99)
TRIGLYCERIDES: 156 mg/dL — AB (ref 0–149)
VLDL CHOLESTEROL CAL: 31 mg/dL (ref 5–40)

## 2017-12-08 MED ORDER — TRIAMCINOLONE ACETONIDE 0.1 % EX CREA: 1.0000 "application " | TOPICAL_CREAM | Freq: Two times a day (BID) | CUTANEOUS | 0 refills | Status: DC

## 2017-12-08 NOTE — Progress Notes (Signed)
Established Patient Office Visit  Subjective:  Patient ID: Erin Lawson, female    DOB: 22-Feb-1960  Age: 57 y.o. MRN: [id number]  CC:  Chief Complaint  Patient presents with  . Diabetes    4 month f/u  . rash on both legs noticed Thursday 12/04/17    not otc creams tried on rash    HPI Erin Lawson presents for diabetes follow up  Diabetes Mellitus: Patient presents for follow up of diabetes. Symptoms: none.  Patient denies foot ulcerations, hyperglycemia, hypoglycemia , increase appetite, nausea, paresthesia of the feet, polydipsia, polyuria and visual disturbances.  Evaluation to date has been included: hemoglobin A1C.  Home sugars: recently started checking sugars and her fasting glucose 130, but in the past month the fasting was 200s.  She reports that her PCP Dr. Reginia Forts told her to take metformin 540m daily with breakfast. She reports that she would like to get off the metformin completely.  She started exercising and believes that her sugars have improved as a result.    Lab Results  Component Value Date   HGBA1C 7.7 (A) 12/08/2017   Lab Results  Component Value Date   HGBA1C 7.7 (A) 12/08/2017    Hypertension: Patient here for follow-up of elevated blood pressure. She is exercising and is adherent to low salt diet.  Blood pressure is well controlled at home. Cardiac symptoms none. Patient denies chest pain, chest pressure/discomfort, claudication, dyspnea, exertional chest pressure/discomfort, fatigue, irregular heart beat and lower extremity edema.  Cardiovascular risk factors: diabetes mellitus and hypertension. Use of agents associated with hypertension: NSAIDS. History of target organ damage: none. She did not take her morning medication.  BP Readings from Last 3 Encounters:  12/08/17 (!) 154/68  08/04/17 136/82  03/31/17 138/70     Vitamin D deficiency Patient reports that she was never told that she has low vitamin D She takes a GPsychiatric nursefor a  multivitamin She has osteoarthritis It affects multiple joints but especially her left knee    Past Medical History:  Diagnosis Date  . [place]18[place]  . Hypertension     Past Surgical History:  Procedure Laterality Date  . CESAREAN SECTION    . utreine polyp     resected    Family History  Problem Relation Age of Onset  . Hypertension Sister   . Heart attack Neg Hx     Social History   Socioeconomic History  . Marital status: Single    Spouse name: Not on file  . Number of children: Not on file  . Years of education: Not on file  . Highest education level: Not on file  Occupational History  . Occupation: cna    Comment: [employer]  . Financial resource strain: Not on file  . Food insecurity:    Worry: Not on file    Inability: Not on file  . Transportation needs:    Medical: Not on file    Non-medical: Not on file  Tobacco Use  . Smoking status: Never Smoker  . Smokeless tobacco: Never Used  Substance and Sexual Activity  . Alcohol use: No  . Drug use: No  . Sexual activity: Not on file  Lifestyle  . Physical activity:    Days per week: Not on file    Minutes per session: Not on file  . Stress: Not on file  Relationships  . Social connections:  Talks on phone: Not on file    Gets together: Not on file    Attends religious service: Not on file    Active member of club or organization: Not on file    Attends meetings of clubs or organizations: Not on file    Relationship status: Not on file  . Intimate partner violence:    Fear of current or ex partner: Not on file    Emotionally abused: Not on file    Physically abused: Not on file    Forced sexual activity: Not on file  Other Topics Concern  . 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 blood glucose meter kit and supplies KIT Dispense based on patient and insurance preference. Use up to four times daily as directed. (FOR ICD-9 250.00, 250.01). 1 each 0  . celecoxib (CELEBREX) 200 MG capsule TAKE 1 CAPSULE (200 MG TOTAL) BY MOUTH DAILY AS NEEDED. 30 capsule 0  . glucose blood (CONTOUR NEXT TEST) test strip Check sugar once daily  Dx:  DMII controlled non-insulin dependent. 100 each 3  . LANCETS ULTRA FINE MISC Check sugar up one time daily dx: new onset DMII uncontrolled non-insulin 100 each 3  . metFORMIN (GLUCOPHAGE) 500 MG tablet Take 500 mg by mouth daily with breakfast.    . amLODipine (NORVASC) 5 MG tablet Take 1 tablet (5 mg total) by mouth daily. Patient needs office visit for more refills 90 tablet 1  . metFORMIN (GLUMETZA) 1000 MG (MOD) 24 hr tablet Take 2 tablets (2,000 mg total) by mouth daily with breakfast. 180 tablet 1   No facility-administered medications prior to visit.     Allergies  Allergen Reactions  . Atenolol Other (See Comments)    REACTION: made me feel bad REACTION: made me feel bad  . Poractant Alfa Other (See Comments)    Migraine   . Pork-Derived Products Other (See Comments)    Migraine   . Meloxicam Rash    ROS Review of Systems Review of Systems  Constitutional: Negative for activity change, appetite change, chills and fever.  HENT: Negative for congestion, nosebleeds, trouble swallowing and voice change.   Respiratory: Negative for cough, shortness of breath and wheezing.   Gastrointestinal: Negative for diarrhea, nausea and vomiting.  Genitourinary: Negative for difficulty urinating, dysuria, flank pain and hematuria.  Musculoskeletal: Negative for back pain, joint swelling and neck pain.    Neurological: Negative for dizziness, speech difficulty, light-headedness and numbness.  Skin: she reports a flat rash that occurred on her ankle and was not really itchy. She saw it about 3 days ago. No drainage or vesicles and is on both ankles. See HPI. All other review of systems negative.     Objective:     BP (!) 154/68 (BP Location: Right Arm, Patient Position: Sitting, Cuff Size: Large)   Pulse 66   Temp 98.8 F (37.1 C) (Oral)   Resp 17   Ht 5' 0.08" (1.526 m)   Wt 149  lb 3.2 oz (67.7 kg)   SpO2 99%   BMI 29.06 kg/m  Wt Readings from Last 3 Encounters:  12/08/17 149 lb 3.2 oz (67.7 kg)  08/04/17 148 lb (67.1 kg)  03/31/17 151 lb (68.5 kg)    Physical Exam  Physical Exam  Constitutional: He is oriented to person, place, and time. He appears well-developed and well-nourished.  HENT:  Head: Normocephalic and atraumatic.  Eyes: Conjunctivae and EOM are normal.  Cardiovascular: Normal rate, regular rhythm, normal heart sounds and intact distal pulses.  No murmur heard. Pulmonary/Chest: Effort normal and breath sounds normal. No stridor. No respiratory distress. He has no wheezes.  Neurological: He is alert and oriented to person, place, and time.  Skin: Skin is warm. Capillary refill takes less than 2 seconds.  Psychiatric: He has a normal mood and affect. His behavior is normal. Judgment and thought content normal.    Health Maintenance Due  Topic Date Due  . PNEUMOCOCCAL POLYSACCHARIDE VACCINE AGE 74-64 HIGH RISK  06/13/1961  . MAMMOGRAM  01/15/2015  . PAP SMEAR  01/15/2015  . INFLUENZA VACCINE  08/14/2017  . OPHTHALMOLOGY EXAM  09/24/2017    There are no preventive care reminders to display for this patient.  Lab Results  Component Value Date   TSH 2.580 08/04/2017   Lab Results  Component Value Date   WBC 9.1 12/08/2017   HGB 10.9 12/08/2017   HCT 31.5 12/08/2017   MCV 68.6 (A) 12/08/2017   PLT 384 08/04/2017   Lab Results  Component Value Date    NA 139 12/08/2017   K 3.8 12/08/2017   CO2 22 12/08/2017   GLUCOSE 124 (H) 12/08/2017   BUN 12 12/08/2017   CREATININE 0.65 12/08/2017   BILITOT 0.3 12/08/2017   ALKPHOS 126 (H) 12/08/2017   AST 22 12/08/2017   ALT 18 12/08/2017   PROT 7.1 12/08/2017   ALBUMIN 4.7 12/08/2017   CALCIUM 9.8 12/08/2017   ANIONGAP 18 (H) 10/01/2013   Lab Results  Component Value Date   CHOL 158 12/08/2017   Lab Results  Component Value Date   HDL 32 (L) 12/08/2017   Lab Results  Component Value Date   LDLCALC 95 12/08/2017   Lab Results  Component Value Date   TRIG 156 (H) 12/08/2017   Lab Results  Component Value Date   CHOLHDL 4.9 (H) 12/08/2017   Lab Results  Component Value Date   HGBA1C 7.7 (A) 12/08/2017      Assessment & Plan:   Problem List Items Addressed This Visit      Cardiovascular and Mediastinum   Essential hypertension (Chronic)    Patient's blood pressure is not at goal currently because she did not take her meds today but typically is at goal of 139/89 or less. Condition is stable. Continue current medications and treatment plan. I recommend that you exercise for 30-45 minutes 5 days a week. I also recommend a balanced diet with fruits and vegetables every day, lean meats, and little fried foods. The DASH diet (you can find this online) is a good example of this.       Relevant Medications   amLODipine (NORVASC) 5 MG tablet   Other Relevant Orders   Comprehensive metabolic panel (Completed)     Endocrine   DM type 2 (diabetes mellitus, type 2) ([place]) - Primary    Improved controlled hemoglobin a1c  Improved and is now less than 8% Goal is a1c 7% or less Continue exercise Lipids monitored and  renal function in range On metformin Reviewed diabetic foot care Emphasized importance of eye and dental exam         Relevant Medications   metFORMIN (GLUCOPHAGE) 500 MG tablet   Other Relevant Orders   HM Diabetes Foot Exam (Completed)   Lipid panel  (Completed)   Comprehensive metabolic panel (Completed)   POCT glycosylated hemoglobin (Hb A1C) (Completed)   Microalbumin, urine (Completed)     Musculoskeletal and Integument   Primary osteoarthritis of left knee    Continue celebrex and discuss with orthopedics her long term plan Will check renal function         Other   Mild anemia    Continues to have mild anemia. Discussed iron supplement over the counter once a day. Levels are stable and patient is postmenopausal. Patient to provide copy of her colonoscopy if not then she should repeat colonoscopy.       Relevant Orders   POCT CBC (Completed)   Vitamin D deficiency    Discussed vitamin D supplementation Will check renal function        Other Visit Diagnoses    Encounter for medication monitoring       Rash, skin       Relevant Medications   triamcinolone cream (KENALOG) 0.1 %      Meds ordered this encounter  Medications  . triamcinolone cream (KENALOG) 0.1 %    Sig: Apply 1 application topically 2 (two) times daily.    Dispense:  30 g    Refill:  0  . amLODipine (NORVASC) 5 MG tablet    Sig: Take 1 tablet (5 mg total) by mouth daily.    Dispense:  90 tablet    Refill:  3    Follow-up: No follow-ups on file.    Forrest Moron, MD

## 2017-12-08 NOTE — Patient Instructions (Addendum)
[employer] for prices     If you have lab work done today you will be contacted with your lab results within the next 2 weeks.  If you have not heard from [name] then please contact us. The fastest way to get your results is to register for My Chart.   IF you received an x-ray today, you will receive an invoice from [facility]. Please contact [facility] at [phone] with questions or concerns regarding your invoice.   IF you received labwork today, you will receive an invoice from [place]. Please contact [facility] at [phone] with questions or concerns regarding your invoice.   Our billing staff will not be able to assist you with questions regarding bills from these companies.  You will be contacted with the lab results as soon as they are available. The fastest way to get your results is to activate your My Chart account. Instructions are located on the last page of this paperwork. If you have not heard from [name] regarding the results in 2 weeks, please contact this office.    We recommend that you schedule a mammogram for breast cancer screening. Typically, you do not need a referral to do this. Please contact a local imaging center to schedule your mammogram.  [facility] - [phone]  *ask for the Radiology Department [facility] ([place]) - [phone] or [phone]  [facility] - [phone] [place] [phone] [facility] - [phone]  *ask for the [facility] - [phone]  *ask for the Radiology Department [facility] - [phone]  *ask for the [place] - [phone]

## 2017-12-09 LAB — MICROALBUMIN, URINE: MICROALBUM., U, RANDOM: 10.9 ug/mL

## 2017-12-09 MED ORDER — AMLODIPINE BESYLATE 5 MG PO TABS: 5.0000 mg | ORAL_TABLET | Freq: Every day | ORAL | 3 refills | Status: DC

## 2017-12-12 ENCOUNTER — Other Ambulatory Visit: Payer: Self-pay

## 2017-12-12 ENCOUNTER — Encounter: Payer: Self-pay | Admitting: Family Medicine

## 2017-12-12 DIAGNOSIS — E559 Vitamin D deficiency, unspecified: Secondary | ICD-10-CM | POA: Insufficient documentation

## 2017-12-12 DIAGNOSIS — M1712 Unilateral primary osteoarthritis, left knee: Secondary | ICD-10-CM | POA: Insufficient documentation

## 2017-12-12 MED ORDER — METFORMIN HCL 500 MG PO TABS: 500.0000 mg | ORAL_TABLET | Freq: Two times a day (BID) | ORAL | 0 refills | Status: DC

## 2017-12-12 NOTE — Assessment & Plan Note (Signed)
Improved controlled hemoglobin a1c  Improved and is now less than 8% Goal is a1c 7% or less Continue exercise Lipids monitored and renal function in range On metformin Reviewed diabetic foot care Emphasized importance of eye and dental exam

## 2017-12-12 NOTE — Assessment & Plan Note (Signed)
Continue celebrex and discuss with orthopedics her long term plan Will check renal function

## 2017-12-12 NOTE — Assessment & Plan Note (Signed)
Continues to have mild anemia. Discussed iron supplement over the counter once a day. Levels are stable and patient is postmenopausal. Patient to provide copy of her colonoscopy if not then she should repeat colonoscopy.

## 2017-12-12 NOTE — Assessment & Plan Note (Signed)
Discussed vitamin D supplementation Will check renal function

## 2017-12-12 NOTE — Assessment & Plan Note (Signed)
Patient's blood pressure is not at goal currently because she did not take her meds today but typically is at goal of 139/89 or less. Condition is stable. Continue current medications and treatment plan. I recommend that you exercise for 30-45 minutes 5 days a week. I also recommend a balanced diet with fruits and vegetables every day, lean meats, and little fried foods. The DASH diet (you can find this online) is a good example of this.

## 2017-12-15 ENCOUNTER — Encounter: Payer: Self-pay | Admitting: *Deleted

## 2017-12-26 ENCOUNTER — Ambulatory Visit (INDEPENDENT_AMBULATORY_CARE_PROVIDER_SITE_OTHER): Payer: 59 | Admitting: Podiatry

## 2017-12-26 ENCOUNTER — Encounter: Payer: Self-pay | Admitting: Podiatry

## 2017-12-26 VITALS — BP 143/70

## 2017-12-26 DIAGNOSIS — E119 Type 2 diabetes mellitus without complications: Secondary | ICD-10-CM | POA: Diagnosis not present

## 2017-12-26 DIAGNOSIS — M2042 Other hammer toe(s) (acquired), left foot: Secondary | ICD-10-CM

## 2017-12-26 DIAGNOSIS — M2041 Other hammer toe(s) (acquired), right foot: Secondary | ICD-10-CM | POA: Diagnosis not present

## 2017-12-26 DIAGNOSIS — M79675 Pain in left toe(s): Secondary | ICD-10-CM | POA: Diagnosis not present

## 2017-12-26 DIAGNOSIS — L84 Corns and callosities: Secondary | ICD-10-CM

## 2017-12-26 DIAGNOSIS — M79674 Pain in right toe(s): Secondary | ICD-10-CM | POA: Diagnosis not present

## 2017-12-26 NOTE — Patient Instructions (Signed)

## 2018-01-31 ENCOUNTER — Encounter: Payer: Self-pay | Admitting: Podiatry

## 2018-01-31 NOTE — Progress Notes (Signed)
Subjective: Erin Lawson presents today with diabetes, She presents to clinic with cc of painful corns and calluses b/l feet. She works in a [employer] and is required to wear work boots and walk on concrete floors. She states she has spents hundreds of dollars looking for comfortable shoes.  Pain is getting progressively worse. Patient is seeking professional help regarding symptoms.  Past Medical History:  Diagnosis Date  . DM type 2 (diabetes mellitus, type 2) ([place]) 05/18/2012  . Hypertension      Patient Active Problem List   Diagnosis Date Noted  . Vitamin D deficiency 12/12/2017  . Primary osteoarthritis of left knee 12/12/2017  . Chest pain 05/18/2012  . Mild anemia 05/18/2012  . DM type 2 (diabetes mellitus, type 2) ([place]) 05/18/2012  . Essential hypertension 04/06/2007  . KELOID 04/06/2007     Past Surgical History:  Procedure Laterality Date  . CESAREAN SECTION    . utreine polyp     resected      Current Outpatient Medications:  .  amLODipine (NORVASC) 5 MG tablet, Take 1 tablet (5 mg total) by mouth daily., Disp: 90 tablet, Rfl: 3 .  blood glucose meter kit and supplies KIT, Dispense based on patient and insurance preference. Use up to four times daily as directed. (FOR ICD-9 250.00, 250.01)., Disp: 1 each, Rfl: 0 .  celecoxib (CELEBREX) 200 MG capsule, TAKE 1 CAPSULE (200 MG TOTAL) BY MOUTH DAILY AS NEEDED., Disp: 30 capsule, Rfl: 0 .  glucose blood (CONTOUR NEXT TEST) test strip, Check sugar once daily  Dx:  DMII controlled non-insulin dependent., Disp: 100 each, Rfl: 3 .  LANCETS ULTRA FINE MISC, Check sugar up one time daily dx: new onset DMII uncontrolled non-insulin, Disp: 100 each, Rfl: 3 .  metFORMIN (GLUCOPHAGE) 500 MG tablet, Take 1 tablet (500 mg total) by mouth 2 (two) times daily with a meal., Disp: 180 tablet, Rfl: 0 .  triamcinolone cream (KENALOG) 0.1 %, Apply 1 application topically 2 (two) times daily., Disp: 30 g, Rfl: 0   Allergies   Allergen Reactions  . Atenolol Other (See Comments)    REACTION: made me feel bad REACTION: made me feel bad  . Poractant Alfa Other (See Comments)    Migraine   . Pork-Derived Products Other (See Comments)    Migraine   . Meloxicam Rash     Social History   Occupational History  . Occupation: cna    Comment: [employer]  Tobacco Use  . Smoking status: Never Smoker  . Smokeless tobacco: Never Used  Substance and Sexual Activity  . Alcohol use: No  . Drug use: No  . Sexual activity: Not on file     Family History  Problem Relation Age of Onset  . Hypertension Sister   . Heart attack Neg Hx       There is no immunization history on file for this patient.   Review of systems: Positive Findings in bold print.  Constitutional:  chills, fatigue, fever, sweats, weight change Communication: Optometrist, sign Ecologist, hand writing, iPad/Android device Head: headaches, head injury Eyes: changes in vision, eye pain, glaucoma, cataracts, macular degeneration, diplopia, glare,  light sensitivity, eyeglasses or contacts, blindness Ears nose mouth throat: Hard of hearing, ringing in ears, deaf, sign language,  vertigo,   nosebleeds,  rhinitis,  cold sores, snoring, swollen glands Cardiovascular: HTN, edema, arrhythmia, pacemaker in place, defibrillator in place,  chest pain/tightness, chronic anticoagulation, blood clot, heart failure  Peripheral Vascular: leg cramps, varicose veins, blood clots, lymphedema Respiratory:  difficulty breathing, denies congestion, SOB, wheezing, cough, emphysema Gastrointestinal: change in appetite or weight, abdominal pain, constipation, diarrhea, nausea, vomiting, vomiting blood, change in bowel habits, abdominal pain, jaundice, rectal bleeding, hemorrhoids, Genitourinary:  nocturia,  pain on urination,  blood in urine, Foley catheter, urinary urgency Musculoskeletal: uses mobility aid,  cramping, stiff joints,  painful joints, decreased joint motion, fractures, OA, gout Skin: +changes in toenails, color change, dryness, itching, mole changes,  rash  Neurological: headaches, numbness in feet, paresthesias in feet, burning in feet, fainting,  seizures, change in speech. denies headaches, memory problems/poor historian, cerebral palsy, weakness, paralysis Endocrine: diabetes, hypothyroidism, hyperthyroidism,  goiter, dry mouth, flushing, heat intolerance,  cold intolerance,  excessive thirst, denies polyuria,  nocturia Hematological:  easy bleeding, excessive bleeding, easy bruising, enlarged lymph nodes, on long term blood thinner, history of past transusions Allergy/immunological:  hives, eczema, frequent infections, multiple drug allergies, seasonal allergies, transplant recipient Psychiatric:  anxiety, depression, mood disorder, suicidal ideations, hallucinations   Objective: Vitals:   12/26/17 0950  BP: (!) 143/70   Vascular Examination: Capillary refill time immediate x 10 digits Dorsalis pedis and Posterior tibial pulses 2/4 b/l Digital hair x 10 digits present b/l Skin temperature gradient WNL b/l  Dermatological Examination: Skin with normal turgor, texture and tone b/l  Toenails 1-5 b/l adequate length and well maintained.  Hyperkeratotic lesions noted submet head 2 b/l, dorsal 5th digit PIPJ b/l  No open wounds.  No interdigital macerations.  Musculoskeletal: Muscle strength 5/5 to all LE muscle groups  HAV with bunion b/l  Hammertoe deformity b/l 5th digit  No pain on passive/active ROM.  No joint effusions or crepitations noted b/l  Neurological: Sensation intact with 10 gram monofilament  Vibratory sensation intact.  Assessment: 1. Calluses submet head 2 b/l 2. Corns dorsal 5th digit PIPJ b/l 3. HAV with bunion b/l 4. Hammertoe deformity b/l  Plan: 1. Discussed diagnoses and treatment options. We discussed her shoe gear. I asked her to bring her work boots in  on her next visit. We will check her benefits for orthotics to offload her calluses. 2. Corns and calluses pared submet head 2 b/l, dorsal 5th digit PIPJ b/l 3. without incident utilizing sterile blade. Gently smoothed with burr. 4. Patient to continue soft, supportive shoe gear 5. Patient to report any pedal injuries to medical professional immediately. 6. Follow up 3 months. Patient/POA to call should there be a concern in the interim.

## 2018-03-09 ENCOUNTER — Ambulatory Visit: Payer: 59 | Admitting: Family Medicine

## 2018-03-17 ENCOUNTER — Telehealth: Payer: Self-pay | Admitting: Family Medicine

## 2018-03-17 NOTE — Telephone Encounter (Signed)
Copied from [place] [id number]. Topic: Quick Communication - See Telephone Encounter >> Mar 17, 2018  2:52 PM Rutherford Nail, NT wrote: CRM for notification. See Telephone encounter for: 03/17/18. Patient calling and states that the test strips to check her blood sugar are going to cost $70. Would like to know if there are any test strips that would be cheaper? Advised that the insurance company would need to let her know which ones they will cover because the doctor does not know that information. Please advise. Patient uses the Contour Next EZ meter. [id number] - [place], [place]

## 2018-03-20 NOTE — Telephone Encounter (Signed)
Advised pt to give her [employer] a call about what test strips would be more affordable and after getting this information to give [name] a call back at the office so we could send them to her pharmacy.

## 2018-03-27 ENCOUNTER — Ambulatory Visit: Payer: 59 | Admitting: Podiatry

## 2018-04-28 ENCOUNTER — Ambulatory Visit: Payer: 59 | Admitting: Podiatry

## 2018-06-27 ENCOUNTER — Emergency Department (HOSPITAL_COMMUNITY): Payer: 59

## 2018-06-27 ENCOUNTER — Emergency Department (HOSPITAL_COMMUNITY)
Admission: EM | Admit: 2018-06-27 | Discharge: 2018-06-27 | Disposition: A | Discharge status: Home / Self Care | Payer: 59 | Attending: Emergency Medicine | Admitting: Emergency Medicine

## 2018-06-27 ENCOUNTER — Encounter (HOSPITAL_COMMUNITY): Payer: Self-pay | Admitting: Student

## 2018-06-27 ENCOUNTER — Other Ambulatory Visit: Payer: Self-pay

## 2018-06-27 DIAGNOSIS — Y990 Civilian activity done for income or pay: Secondary | ICD-10-CM | POA: Insufficient documentation

## 2018-06-27 DIAGNOSIS — E1165 Type 2 diabetes mellitus with hyperglycemia: Secondary | ICD-10-CM | POA: Insufficient documentation

## 2018-06-27 DIAGNOSIS — X58XXXA Exposure to other specified factors, initial encounter: Secondary | ICD-10-CM | POA: Diagnosis not present

## 2018-06-27 DIAGNOSIS — Y9389 Activity, other specified: Secondary | ICD-10-CM | POA: Insufficient documentation

## 2018-06-27 DIAGNOSIS — I10 Essential (primary) hypertension: Secondary | ICD-10-CM | POA: Diagnosis not present

## 2018-06-27 DIAGNOSIS — Z7984 Long term (current) use of oral hypoglycemic drugs: Secondary | ICD-10-CM | POA: Insufficient documentation

## 2018-06-27 DIAGNOSIS — S39011A Strain of muscle, fascia and tendon of abdomen, initial encounter: Secondary | ICD-10-CM

## 2018-06-27 DIAGNOSIS — Z79899 Other long term (current) drug therapy: Secondary | ICD-10-CM | POA: Insufficient documentation

## 2018-06-27 DIAGNOSIS — Y9289 Other specified places as the place of occurrence of the external cause: Secondary | ICD-10-CM | POA: Insufficient documentation

## 2018-06-27 DIAGNOSIS — R739 Hyperglycemia, unspecified: Secondary | ICD-10-CM

## 2018-06-27 DIAGNOSIS — S3991XA Unspecified injury of abdomen, initial encounter: Secondary | ICD-10-CM | POA: Diagnosis present

## 2018-06-27 LAB — URINALYSIS, ROUTINE W REFLEX MICROSCOPIC
Bacteria, UA: NONE SEEN
Bilirubin Urine: NEGATIVE
Glucose, UA: >=500 mg/dL — AB
Hgb urine dipstick: NEGATIVE
Ketones, ur: 5 mg/dL — AB
Leukocytes,Ua: NEGATIVE
Nitrite: NEGATIVE
Protein, ur: NEGATIVE mg/dL
Specific Gravity, Urine: 1.007 (ref 1.005–1.030)
pH: 6 (ref 5.0–8.0)

## 2018-06-27 LAB — COMPREHENSIVE METABOLIC PANEL
ALT: 35 U/L (ref 0–44)
AST: 29 U/L (ref 15–41)
Albumin: 3.8 g/dL (ref 3.5–5.0)
Alkaline Phosphatase: 116 U/L (ref 38–126)
Anion gap: 12 (ref 5–15)
BUN: 6 mg/dL (ref 6–20)
CO2: 21 mmol/L — ABNORMAL LOW (ref 22–32)
Calcium: 9.1 mg/dL (ref 8.9–10.3)
Chloride: 101 mmol/L (ref 98–111)
Creatinine, Ser: 0.58 mg/dL (ref 0.44–1.00)
GFR calc Af Amer: >60 mL/min (ref >60)
GFR calc non Af Amer: >60 mL/min (ref >60)
Glucose, Bld: 286 mg/dL — ABNORMAL HIGH (ref 70–99)
Potassium: 3.6 mmol/L (ref 3.5–5.1)
Sodium: 134 mmol/L — ABNORMAL LOW (ref 135–145)
Total Bilirubin: 0.8 mg/dL (ref 0.3–1.2)
Total Protein: 6.6 g/dL (ref 6.5–8.1)

## 2018-06-27 LAB — CBC WITH DIFFERENTIAL/PLATELET
Abs Immature Granulocytes: 0.05 10*3/uL (ref 0.00–0.07)
Basophils Absolute: 0.1 10*3/uL (ref 0.0–0.1)
Basophils Relative: 1 %
Eosinophils Absolute: 0.4 10*3/uL (ref 0.0–0.5)
Eosinophils Relative: 5 %
HCT: 36.4 % (ref 36.0–46.0)
Hemoglobin: 11.5 g/dL — ABNORMAL LOW (ref 12.0–15.0)
Immature Granulocytes: 1 %
Lymphocytes Relative: 42 %
Lymphs Abs: 3.3 10*3/uL (ref 0.7–4.0)
MCH: 22.7 pg — ABNORMAL LOW (ref 26.0–34.0)
MCHC: 31.6 g/dL (ref 30.0–36.0)
MCV: 71.8 fL — ABNORMAL LOW (ref 80.0–100.0)
Monocytes Absolute: 0.4 10*3/uL (ref 0.1–1.0)
Monocytes Relative: 6 %
Neutro Abs: 3.6 10*3/uL (ref 1.7–7.7)
Neutrophils Relative %: 45 %
Platelets: 366 10*3/uL (ref 150–400)
RBC: 5.07 MIL/uL (ref 3.87–5.11)
RDW: 13.4 % (ref 11.5–15.5)
WBC: 7.7 10*3/uL (ref 4.0–10.5)
nRBC: 0 % (ref 0.0–0.2)

## 2018-06-27 LAB — CBG MONITORING, ED: Glucose-Capillary: 190 mg/dL — ABNORMAL HIGH (ref 70–99)

## 2018-06-27 MED ORDER — SODIUM CHLORIDE 0.9 % IV BOLUS
1000.0000 mL | Freq: Once | INTRAVENOUS | Status: AC
  Administered 2018-06-27: 1000 mL via INTRAVENOUS

## 2018-06-27 MED ORDER — METHOCARBAMOL 500 MG PO TABS: 500.0000 mg | ORAL_TABLET | Freq: Three times a day (TID) | ORAL | 0 refills | Status: DC | PRN

## 2018-06-27 MED ORDER — IOHEXOL 300 MG/ML  SOLN
100.0000 mL | Freq: Once | INTRAMUSCULAR | Status: AC | PRN
  Administered 2018-06-27: 100 mL via INTRAVENOUS

## 2018-06-27 NOTE — ED Notes (Signed)
Patient transported to CT 

## 2018-06-27 NOTE — Discharge Instructions (Addendum)
You are seen in the emergency department today for abdominal pain.  Your CT scan showed that there is a strain versus a tear of your rectus muscle in your abdomen.  Please continue taking Motrin per over-the-counter dosing. We have also prescribed robaxin to help with this as well  - Robaxin is the muscle relaxer I have prescribed, this is meant to help with muscle tightness. Be aware that this medication may make you drowsy therefore the first time you take this it should be at a time you are in an environment where you can rest. Do not drive or operate heavy machinery when taking this medication. Do not drink alcohol or take other sedating medications with this medicine such as narcotics or benzodiazepines.   You make take Tylenol per over the counter dosing with these medications.   We have prescribed you new medication(s) today. Discuss the medications prescribed today with your pharmacist as they can have adverse effects and interactions with your other medicines including over the counter and prescribed medications. Seek medical evaluation if you start to experience new or abnormal symptoms after taking one of these medicines, seek care immediately if you start to experience difficulty breathing, feeling of your throat closing, facial swelling, or rash as these could be indications of a more serious allergic reaction  Please try to rest your abdominal muscles as much as possible.  Your CT scan also showed some fatty liver changes as well as uterine fibroids- discuss with your primary care provider please.  Your blood sugar was high in the emergency department today.  This is likely because you are not taking your metformin.  Please start taking your metformin again despite not having your test strips.  Please follow-up very closely with your primary care provider for a recheck of your blood sugar and to discuss alternatives to your testing strips.   Return to the ER for new or worsening symptoms  or any other concerns.

## 2018-06-27 NOTE — ED Triage Notes (Signed)
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 She now reports pain in her RLQ. Saw pcp this am who sent her to the ED.

## 2018-06-27 NOTE — ED Provider Notes (Signed)
[facility] EMERGENCY DEPARTMENT Provider Note   CSN: [id number] Arrival date & time: 06/27/18  1027     History   Chief Complaint Chief Complaint  Patient presents with  . Abdominal Pain    HPI ISSABELLE Lawson is a 58 y.o. female w/ a hx of T2DM (currently not taking her metformin) & HTN who presents to the ED with complaints of intermittent abdominal pain x 1.5 weeks. 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 This has involved a great deal of sit ups, weight lifting, and running. She cannot recall a traumatic injury, but since starting training she has had issues with intermittent abdominal pain w/ sitting up & with coughing/sneezing. Initially pain was more mid to upper abdomen, she had 1 sneeze where she had severe pain w/ a tearing sensation in the R lower abdomen/groin which is where pain is now located. No re-occurrence of the severe tearing sensation has occurred. Continued to have pain w/ above aggravating factors. At rest she has minimal to no discomfort. Currently she would rate her pain a 2/10 in severity. Denies fever, chills, nausea, vomiting, diarrhea, melena, constipation, or dysuria. Patient has been menopausal for almost 10 years now. Saw outpatient provider who recommended she come to the ER for CT imaging.      HPI  Past Medical History:  Diagnosis Date  . DM type 2 (diabetes mellitus, type 2) ([place]) 05/18/2012  . Hypertension     Patient Active Problem List   Diagnosis Date Noted  . Vitamin D deficiency 12/12/2017  . Primary osteoarthritis of left knee 12/12/2017  . Chest pain 05/18/2012  . Mild anemia 05/18/2012  . DM type 2 (diabetes mellitus, type 2) ([place]) 05/18/2012  . Essential hypertension 04/06/2007  . KELOID 04/06/2007    Past Surgical History:  Procedure Laterality Date  . CESAREAN SECTION    . utreine polyp     resected     OB History   No obstetric history on  file.      Home Medications    Prior to Admission medications   Medication Sig Start Date End Date Taking? Authorizing Provider  amLODipine (NORVASC) 5 MG tablet Take 1 tablet (5 mg total) by mouth daily. 12/09/17   Forrest Moron, MD  blood glucose meter kit and supplies KIT Dispense based on patient and insurance preference. Use up to four times daily as directed. (FOR ICD-9 250.00, 250.01). 09/24/16   Wardell Honour, MD  celecoxib (CELEBREX) 200 MG capsule TAKE 1 CAPSULE (200 MG TOTAL) BY MOUTH DAILY AS NEEDED. 06/01/17   Wardell Honour, MD  glucose blood (CONTOUR NEXT TEST) test strip Check sugar once daily  Dx:  DMII controlled non-insulin dependent. 08/04/17   Wardell Honour, MD  LANCETS ULTRA FINE MISC Check sugar up one time daily dx: new onset DMII uncontrolled non-insulin 08/04/17   Wardell Honour, MD  metFORMIN (GLUCOPHAGE) 500 MG tablet Take 1 tablet (500 mg total) by mouth 2 (two) times daily with a meal. 12/12/17   Delia Chimes A, MD  triamcinolone cream (KENALOG) 0.1 % Apply 1 application topically 2 (two) times daily. 12/08/17   Forrest Moron, MD    Family History Family History  Problem Relation Age of Onset  . Hypertension Sister   . Heart attack Neg Hx     Social History Social History   Tobacco Use  . Smoking status: Never Smoker  . Smokeless tobacco:  Never Used  Substance Use Topics  . Alcohol use: No  . Drug use: No     Allergies   Atenolol, Poractant alfa, Pork-derived products, and Meloxicam   Review of Systems Review of Systems  Constitutional: Negative for chills and fever.  Respiratory: Negative for shortness of breath.   Cardiovascular: Negative for chest pain.  Gastrointestinal: Positive for abdominal pain. Negative for blood in stool, constipation, diarrhea, nausea and vomiting.  Genitourinary: Negative for dysuria.  All other systems reviewed and are negative.    Physical Exam Updated Vital Signs BP (!) 186/77   Pulse 67    Temp 97.6 F (36.4 C) (Oral)   Resp 16   SpO2 100%   Physical Exam Vitals signs and nursing note reviewed.  Constitutional:      General: She is not in acute distress.    Appearance: She is well-developed. She is not toxic-appearing.  HENT:     Head: Normocephalic and atraumatic.  Eyes:     General:        Right eye: No discharge.        Left eye: No discharge.     Conjunctiva/sclera: Conjunctivae normal.  Neck:     Musculoskeletal: Neck supple.  Cardiovascular:     Rate and Rhythm: Normal rate and regular rhythm.  Pulmonary:     Effort: Pulmonary effort is normal. No respiratory distress.     Breath sounds: Normal breath sounds. No wheezing, rhonchi or rales.  Abdominal:     General: There is no distension.     Palpations: Abdomen is soft.     Tenderness: There is abdominal tenderness (R suprapubic area). There is no guarding or rebound. Negative signs include McBurney's sign.  Skin:    General: Skin is warm and dry.     Findings: No rash.  Neurological:     Mental Status: She is alert.     Comments: Clear speech.   Psychiatric:        Behavior: Behavior normal.    ED Treatments / Results  Labs (all labs ordered are listed, but only abnormal results are displayed) Labs Reviewed  CBC WITH DIFFERENTIAL/PLATELET - Abnormal; Notable for the following components:      Result Value   Hemoglobin 11.5 (*)    MCV 71.8 (*)    MCH 22.7 (*)    All other components within normal limits  COMPREHENSIVE METABOLIC PANEL - Abnormal; Notable for the following components:   Sodium 134 (*)    CO2 21 (*)    Glucose, Bld 286 (*)    All other components within normal limits  URINALYSIS, ROUTINE W REFLEX MICROSCOPIC - Abnormal; Notable for the following components:   Color, Urine STRAW (*)    Glucose, UA >=500 (*)    Ketones, ur 5 (*)    All other components within normal limits  CBG MONITORING, ED - Abnormal; Notable for the following components:   Glucose-Capillary 190 (*)     All other components within normal limits    EKG    Radiology Ct Abdomen Pelvis W Contrast  Result Date: 06/27/2018 CLINICAL DATA:  Abdominal pain after practicing defense tactics. EXAM: CT ABDOMEN AND PELVIS WITH CONTRAST TECHNIQUE: Multidetector CT imaging of the abdomen and pelvis was performed using the standard protocol following bolus administration of intravenous contrast. CONTRAST:  12m OMNIPAQUE IOHEXOL 300 MG/ML  SOLN COMPARISON:  10/02/2013 FINDINGS: Lower chest: 0.5 by 0.2 cm nodule in the left lower lobe on image 7/4, stable from 10/02/2013  and considered benign. Hepatobiliary: Suspected mild hepatic steatosis. Mild sparing along the gallbladder fossa. No focal liver lesion identified. Gallbladder appears unremarkable. Pancreas: Unremarkable Spleen: Unremarkable Adrenals/Urinary Tract: Unremarkable Stomach/Bowel: Normal appendix. No significant bowel abnormalities identified. Vascular/Lymphatic: Unremarkable Reproductive: Chronic uterine fibroids including an approximately 4 cm fibroid in the left uterine body. Adnexa unremarkable. Other: No supplemental non-categorized findings. Musculoskeletal: Compared to 10/02/2013 there is some mild thickening of the inferior rectus abdominus and rectus aponeurosis as it approaches the pubic symphysis, with some adjacent stranding in the subcutaneous tissues, right greater than left. IMPRESSION: 1. Mild interval thickening of the lower right rectus muscle and rectus aponeurosis with adjacent subcutaneous edema overlying the aponeurosis and extending in the right pubis. The possibility of rectus aponeurosis tear ("sports hernia" ) or sprain is raised. 2. Suspected mild hepatic steatosis. 3. Normal appendix. 4. Chronic uterine fibroids. Electronically Signed   By: Van Clines M.D.   On: 06/27/2018 13:08    Procedures Procedures (including critical care time)  Medications Ordered in ED Medications - No data to display   Initial Impression /  Assessment and Plan / ED Course  I have reviewed the triage vital signs and the nursing notes.  Pertinent labs & imaging results that were available during my care of the patient were reviewed by me and considered in my medical decision making (see chart for details).   Patient presents w/ intermittent abdominal pain, new exercise activities. Nontoxic appearing, no apparent distress, vitals w/ elevated BP- doubt HTN emergency. Exam w/ R suprapubic tenderness, cannot palpate an obvious hernia. Discussed w/ Dr. Lita Mains- will proceed w/ labs & CT imaging.   CBC: No leukocytosis. Anemia similar to prior CMP: Hyperglycemia w/ mildly low bicarb, anion gap WNL. No significant electrolyte derangement, mild hyponatremia. LFTs WNL.  UA: Glucosuria w/ minimal ketonuria.   Labs w/ hyperglycemia, minimal ketonuria & minimally low bicarb, anion gap WNL. Fluids given in the ER w/ improvement of CBG. She has not been taking her metformin due to an insurance issue with her test strips, but she does have the metformin at home. I instructed her to restart her metformin & call her PCP in regards to the test strip issue & for close follow up for repeat labs. Her BP has also improved, but will need to be rechecked as well.   CT abdomen/pelvis: Mild interval thickening of the lower right rectus muscle and rectus aponeurosis with adjacent subcutaneous edema overlying the aponeurosis and extending in the right pubis. The possibility of rectus aponeurosis tear ("sports hernia" ) or sprain is raised. Additional incidental findings as above.   Suspect muscle strain vs. Tear as etiology of her sxs. She has been taking motrin at home w/ some improvement, encouraged her to continue this PRN, will also provide robaxin, instructed patient not to drive or operate heavy machinery when taking robaxin. Instructed rest. I discussed results, treatment plan, need for follow-up, and return precautions with the patient. Provided opportunity  for questions, patient confirmed understanding and is in agreement with plan.   Findings and plan of care discussed with supervising physician Dr. Lita Mains who is in agreement.   Vitals:   06/27/18 1422 06/27/18 1430  BP: (!) 147/79 138/69  Pulse: 61 (!) 59  Resp: 14   Temp:    SpO2: 100% 99%     Final Clinical Impressions(s) / ED Diagnoses   Final diagnoses:  Strain of rectus abdominis muscle, initial encounter  Hyperglycemia    ED Discharge Orders  Ordered    methocarbamol (ROBAXIN) 500 MG tablet  Every 8 hours PRN     06/27/18 1433           Marenda Accardi, Glynda Jaeger, PA-C 06/27/18 1530    Julianne Rice, MD 06/28/18 2044

## 2018-06-27 NOTE — ED Notes (Signed)
ED Provider at bedside. 

## 2018-06-27 NOTE — ED Notes (Signed)
Patient verbalizes understanding of discharge instructions. Opportunity for questioning and answers were provided. Armband removed by staff, pt discharged from ED.  

## 2018-06-27 NOTE — ED Notes (Signed)
Pt unable to provide urine sample at this time 

## 2018-06-27 NOTE — ED Notes (Signed)
Pt given ACE bandage per her request- EDP made aware

## 2018-07-21 ENCOUNTER — Encounter: Payer: Self-pay | Admitting: Family Medicine

## 2018-07-21 ENCOUNTER — Ambulatory Visit: Payer: 59 | Admitting: Family Medicine

## 2018-07-21 ENCOUNTER — Other Ambulatory Visit: Payer: Self-pay

## 2018-07-21 VITALS — BP 140/78 | HR 66 | Temp 98.9°F | Resp 16 | Wt 146.0 lb

## 2018-07-21 DIAGNOSIS — I10 Essential (primary) hypertension: Secondary | ICD-10-CM | POA: Diagnosis not present

## 2018-07-21 DIAGNOSIS — S39011D Strain of muscle, fascia and tendon of abdomen, subsequent encounter: Secondary | ICD-10-CM

## 2018-07-21 DIAGNOSIS — Z1239 Encounter for other screening for malignant neoplasm of breast: Secondary | ICD-10-CM

## 2018-07-21 DIAGNOSIS — E1165 Type 2 diabetes mellitus with hyperglycemia: Secondary | ICD-10-CM

## 2018-07-21 MED ORDER — BLOOD GLUCOSE METER KIT: PACK | 0 refills | Status: DC

## 2018-07-21 MED ORDER — METFORMIN HCL 500 MG PO TABS: 500.0000 mg | ORAL_TABLET | Freq: Every day | ORAL | 0 refills | Status: DC

## 2018-07-21 NOTE — Patient Instructions (Addendum)
If you have lab work done today you will be contacted with your lab results within the next 2 weeks.  If you have not heard from [name] then please contact us. The fastest way to get your results is to register for My Chart.   IF you received an x-ray today, you will receive an invoice from [facility]. Please contact [facility] at [phone] with questions or concerns regarding your invoice.   IF you received labwork today, you will receive an invoice from [facility]. Please contact [facility] at [phone] with questions or concerns regarding your invoice.   Our billing staff will not be able to assist you with questions regarding bills from these companies.  You will be contacted with the lab results as soon as they are available. The fastest way to get your results is to activate your My Chart account. Instructions are located on the last page of this paperwork. If you have not heard from [name] regarding the results in 2 weeks, please contact this office.      Review your herbal supplements to ensure that your supplements do not include St. John's Wort  Also avoid grapefruits    St. John's Wort, Hypericum perforatum oral dosage forms What is this medicine? ST. JOHN'S WORT (seynt JONZ wurt) is an herbal or dietary supplement. It is promoted to help improve depressed moods. The FDA has not approved this supplement for any medical use. This supplement may be used for other purposes; ask your health care provider or pharmacist if you have questions. This medicine may be used for other purposes; ask your health care provider or pharmacist if you have questions.  What may interact with this medicine? Do not take this medicine with any of the following medications:  MAOIs like Carbex, Eldepryl, Marplan, Nardil, and Parnate  methylene blue (injected into a vein) This medicine may also interact with the following  medications:  alcohol  amiodarone  amphetamines  aspirin and aspirin-like medicines  bosentan  certain medicines for depression, anxiety, or psychotic disturbances  certain medicines for migraine headaches like almotriptan, eletriptan, frovatriptan, naratriptan, rizatriptan, sumatriptan, zolmitriptan  cyclosporine  digoxin  female hormones, like estrogens or progestins and birth control pills  irinotecan  linezolid  lithium  medicines for blood pressure or heart disease  medicines for HIV infection  medicines for sleep during surgery  NSAIDs, medicines for pain and inflammation, like ibuprofen or naproxen  procarbazine  rasagiline  sirolimus  sunitinib  tacrolimus  theophylline  tramadol  tryptophan  warfarin This list may not describe all possible interactions. Give your health care provider a list of all the medicines, herbs, non-prescription drugs, or dietary supplements you use. Also tell them if you smoke, drink alcohol, or use illegal drugs. Some items may interact with your medicine. What should I watch for while using this medicine? If you are already being treated for anxiety, depression, or other mental state use this supplement only by your doctor's direction. This supplement my interfere with your other treatments. See your doctor if your symptoms do not get better or if they get worse. This medicine can make you more sensitive to the sun. Keep out of the sun. If you cannot avoid being in the sun, wear protective clothing and use sunscreen. Do not use sun lamps or tanning beds/booths. If you are scheduled for any medical or dental procedure, tell your healthcare provider that you are taking this medicine. You may need to stop taking this supplement before  the procedure. Your mouth may get dry. Chewing sugarless gum or sucking hard candy, and drinking plenty of water may help. Contact your doctor if the problem does not go away or is  severe. Herbal or dietary supplements are not regulated like medicines. Rigid quality control standards are not required for dietary supplements. The purity and strength of these products can vary. The safety and effect of this dietary supplement for a certain disease or illness is not well known. This product is not intended to diagnose, treat, cure or prevent any disease. The Food and Drug Administration suggests the following to help consumers protect themselves:  Always read product labels and follow directions.  Natural does not mean a product is safe for humans to take.  Look for products that include USP after the ingredient name. This means that the manufacturer followed the standards of the [name] Pharmacopoeia.  Supplements made or sold by a nationally known food or drug company are more likely to be made under tight controls. You can write to the company for more information about how the product was made. What side effects may I notice from receiving this medicine? Side effects that you should report to your doctor or health care professional as soon as possible:  allergic reactions like skin rash, itching or hives, swelling of the face, lips, or tongue  anxious  breathing problems  changes in emotions or moods  confusion  fast or irregular heartbeat  increased sweating  stiff muscles  suicidal thoughts or other mood changes  uncontrolled head, mouth, neck, arm, or leg movements  unusually weak or tired Side effects that usually do not require medical attention (report to your doctor or health care professional if they continue or are bothersome):  change in sex drive or performance  difficulty sleeping  sensitivity to sunlight  stomach upset This list may not describe all possible side effects. Call your doctor for medical advice about side effects. You may report side effects to FDA at 1-800-FDA-1088. Where should I keep my medicine? Keep out of the reach of  children. Store at room temperature between 15 and 30 degrees C (59 and 86 degrees F) or as directed on the package label. Protect from moisture. Throw away any unused supplement after the expiration date. NOTE: This sheet is a summary. It may not cover all possible information. If you have questions about this medicine, talk to your doctor, pharmacist, or health care provider.  2020 Elsevier/Gold Standard (2015-02-02 14:31:14)

## 2018-07-21 NOTE — Progress Notes (Signed)
Established Patient Office Visit  Subjective:  Patient ID: Erin Lawson, female    DOB: May 29, 1960  Age: 58 y.o. MRN: [id number]  CC:  Chief Complaint  Patient presents with  . Hospitalization Follow-up    pt states she was feeling better until this week and then she noticed some pain in the abdominal area   . Diabetes    need checked and medication refills     HPI Erin Lawson presents for   Diabetes Mellitus: Patient presents for follow up of diabetes. Symptoms: hyperglycemia. Symptoms have gradually improved. Patient denies hypoglycemia , increase appetite, polydipsia, visual disturbances and vomitting.  Evaluation to date has been included: hemoglobin A1C.  Home sugars: BGs have been labile ranging between 200 and 300.  Most of her blood glucose is under 200 recently. Treatment to date: Continued metformin which has been effective. She takes her metformin once a day. Lab Results  Component Value Date   HGBA1C 7.7 (A) 12/08/2017   Wt Readings from Last 3 Encounters:  07/21/18 146 lb (66.2 kg)  12/08/17 149 lb 3.2 oz (67.7 kg)  08/04/17 148 lb (67.1 kg)    Right rectus abdominis muscle strain Patient reports that she went back to her main job at the [employer] in [place] She states that she felt a pain in the abdomen yesterday like she pulled a muscle She was seen in the ER on 06/27/2018 and diagnosed with rectus abdominis muscle strain She states that the pain was 3/10 She was learning defense tactics at the time of the injury at work. She also did sit-ups that were very painful due to injury.   Hypertension: Patient here for follow-up of elevated blood pressure. She is exercising prior to her injury and is adherent to low salt diet.  Blood pressure is well controlled at home. Cardiac symptoms none. Patient denies chest pain, chest pressure/discomfort, claudication, irregular heart beat and near-syncope.  Cardiovascular risk factors: diabetes mellitus and  hypertension. Use of agents associated with hypertension: none. History of target organ damage: none. BP Readings from Last 3 Encounters:  07/21/18 140/78  06/27/18 138/69  12/26/17 (!) 143/70     Past Medical History:  Diagnosis Date  . DM type 2 (diabetes mellitus, type 2) ([place]) 05/18/2012  . Hypertension     Past Surgical History:  Procedure Laterality Date  . CESAREAN SECTION    . utreine polyp     resected    Family History  Problem Relation Age of Onset  . Hypertension Sister   . Heart attack Neg Hx     Social History   Socioeconomic History  . Marital status: Single    Spouse name: Not on file  . Number of children: Not on file  . Years of education: Not on file  . Highest education level: Not on file  Occupational History  . Occupation: cna    Comment: [employer]  Social Needs  . Financial resource strain: Not on file  . Food insecurity    Worry: Not on file    Inability: Not on file  . Transportation needs    Medical: Not on file    Non-medical: Not on file  Tobacco Use  . Smoking status: Never Smoker  . Smokeless tobacco: Never Used  Substance and Sexual Activity  . Alcohol use: No  . Drug use: No  . Sexual activity: Not on file  Lifestyle  . Physical activity    Days per  week: Not on file    Minutes per session: Not on file  . Stress: Not on file  Relationships  . Social Herbalist on phone: Not on file    Gets together: Not on file    Attends religious service: Not on file    Active member of club or organization: Not on file    Attends meetings of clubs or organizations: Not on file    Relationship status: Not on file  . Intimate partner violence    Fear of current or ex partner: Not on file    Emotionally abused: Not on file    Physically abused: Not on file    Forced sexual activity: Not on file  Other Topics Concern  . 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 amLODipine (NORVASC) 5 MG tablet Take 1 tablet (5 mg total) by mouth daily. 90 tablet 3  . blood glucose meter kit and supplies KIT Dispense based on patient and insurance preference. Use up to four times daily as directed. (FOR ICD-9 250.00, 250.01). 1 each 0  . celecoxib (CELEBREX) 200 MG capsule TAKE 1 CAPSULE (200 MG TOTAL) BY MOUTH DAILY AS NEEDED. 30 capsule 0  . glucose blood (CONTOUR NEXT TEST) test strip Check sugar once daily  Dx:  DMII controlled non-insulin dependent. 100 each 3  . LANCETS ULTRA FINE MISC Check sugar up one time daily dx: new onset DMII uncontrolled non-insulin 100 each 3  . methocarbamol (ROBAXIN) 500 MG tablet Take 1 tablet (500 mg total) by mouth every 8 (eight) hours as needed for muscle spasms. 15 tablet 0  . triamcinolone cream (KENALOG) 0.1 % Apply 1 application topically 2 (two) times daily. 30 g 0  . metFORMIN (GLUCOPHAGE) 500 MG tablet Take 1 tablet (500 mg total) by mouth 2 (two) times daily with a meal. 180 tablet 0   No facility-administered medications prior to visit.     Allergies  Allergen Reactions  . Atenolol Other (See Comments)    REACTION: made me feel bad REACTION: made me feel bad  . Poractant Alfa Other (See Comments)    Migraine   . Pork-Derived Products Other (See Comments)    Migraine   . Meloxicam Rash    ROS Review of Systems Review of Systems  Constitutional: Negative for activity change, appetite change, chills and fever.  HENT: Negative for congestion, nosebleeds, trouble swallowing and voice change.   Respiratory: Negative for cough, shortness of breath and wheezing.   Gastrointestinal: Negative  for diarrhea, nausea and vomiting.  Genitourinary: Negative for difficulty urinating, dysuria, flank pain and hematuria.  Musculoskeletal: Negative for back pain, joint swelling and neck pain.  Neurological: Negative for dizziness, speech difficulty, light-headedness and numbness.  See HPI. All other review of systems negative.      Objective:     Vitals:   07/21/18 1122  BP: 140/78  Pulse: 66  Resp: 16  Temp: 98.9 F (37.2 C)  TempSrc:  Oral  SpO2: 95%  Weight: 146 lb (66.2 kg)     Physical Exam  Constitutional: She is oriented to person, place, and time. She appears well-developed and well-nourished.  HENT:  Head: Normocephalic and atraumatic.  Eyes: Conjunctivae and EOM are normal.  Neck: Normal range of motion. Neck supple.  Cardiovascular: Normal rate, regular rhythm and normal heart sounds.  No murmur heard. Pulmonary/Chest: Effort normal and breath sounds normal. No respiratory distress. She has no wheezes. She has no rales.  Abdominal: Normal appearance and bowel sounds are normal. There is no hepatosplenomegaly. There is no rigidity, no rebound, no guarding, no CVA tenderness, no tenderness at McBurney's point and negative Murphy's sign. No hernia. Hernia confirmed negative in the ventral area.    Neurological: She is alert and oriented to person, place, and time.  Psychiatric: She has a normal mood and affect. Her behavior is normal. Judgment and thought content normal.     Health Maintenance Due  Topic Date Due  . PNEUMOCOCCAL POLYSACCHARIDE VACCINE AGE 61-64 HIGH RISK  06/13/1961  . MAMMOGRAM  01/15/2015  . PAP SMEAR-Modifier  01/15/2015  . OPHTHALMOLOGY EXAM  09/24/2017  . HEMOGLOBIN A1C  06/08/2018    There are no preventive care reminders to display for this patient.  Lab Results  Component Value Date   TSH 2.580 08/04/2017   Lab Results  Component Value Date   WBC 7.7 06/27/2018   HGB 11.5 (L) 06/27/2018   HCT 36.4 06/27/2018   MCV 71.8 (L)  06/27/2018   PLT 366 06/27/2018   Lab Results  Component Value Date   NA 134 (L) 06/27/2018   K 3.6 06/27/2018   CO2 21 (L) 06/27/2018   GLUCOSE 286 (H) 06/27/2018   BUN 6 06/27/2018   CREATININE 0.58 06/27/2018   BILITOT 0.8 06/27/2018   ALKPHOS 116 06/27/2018   AST 29 06/27/2018   ALT 35 06/27/2018   PROT 6.6 06/27/2018   ALBUMIN 3.8 06/27/2018   CALCIUM 9.1 06/27/2018   ANIONGAP 12 06/27/2018   Lab Results  Component Value Date   CHOL 158 12/08/2017   Lab Results  Component Value Date   HDL 32 (L) 12/08/2017   Lab Results  Component Value Date   LDLCALC 95 12/08/2017   Lab Results  Component Value Date   TRIG 156 (H) 12/08/2017   Lab Results  Component Value Date   CHOLHDL 4.9 (H) 12/08/2017   Lab Results  Component Value Date   HGBA1C 7.7 (A) 12/08/2017      Assessment & Plan:   Problem List Items Addressed This Visit      Cardiovascular and Mediastinum   Essential hypertension (Chronic)-  bp should be <130/80 Will continue      Endocrine   DM type 2 (diabetes mellitus, type 2) ([place]) - Primary  -  Continue metformin Monitoring today Discussed glycemic index   Relevant Medications   metFORMIN (GLUCOPHAGE) 500 MG tablet   Other Relevant Orders   Comprehensive metabolic panel   Lipid panel   Hemoglobin A1c   CBC    Other Visit Diagnoses    Screening for breast cancer       Relevant Orders   MM DIGITAL SCREENING BILATERAL   Strain of rectus abdominis muscle, subsequent encounter    -  Advised physical therapy   Relevant Orders   Ambulatory referral to Physical Therapy      Meds ordered this encounter  Medications  . blood glucose meter kit and supplies  Sig: Dispense based on patient and insurance preference. Use up to four times daily as directed. (FOR ICD-10 E10.9, E11.9).    Dispense:  1 each    Refill:  0    Order Specific Question:   Number of strips    Answer:   100    Order Specific Question:   Number of lancets    Answer:    100  . metFORMIN (GLUCOPHAGE) 500 MG tablet    Sig: Take 1 tablet (500 mg total) by mouth daily with breakfast.    Dispense:  180 tablet    Refill:  0    Follow-up: No follow-ups on file.    Forrest Moron, MD

## 2018-07-22 LAB — HEMOGLOBIN A1C
Est. average glucose Bld gHb Est-mCnc: 272 mg/dL
Hgb A1c MFr Bld: 11.1 % — ABNORMAL HIGH (ref 4.8–5.6)

## 2018-07-22 LAB — CBC
Hematocrit: 35.5 % (ref 34.0–46.6)
Hemoglobin: 11.5 g/dL (ref 11.1–15.9)
MCH: 22.6 pg — ABNORMAL LOW (ref 26.6–33.0)
MCHC: 32.4 g/dL (ref 31.5–35.7)
MCV: 70 fL — ABNORMAL LOW (ref 79–97)
Platelets: 449 10*3/uL (ref 150–450)
RBC: 5.09 x10E6/uL (ref 3.77–5.28)
RDW: 13.7 % (ref 11.7–15.4)
WBC: 9.9 10*3/uL (ref 3.4–10.8)

## 2018-07-22 LAB — COMPREHENSIVE METABOLIC PANEL
ALT: 32 IU/L (ref 0–32)
AST: 25 IU/L (ref 0–40)
Albumin/Globulin Ratio: 2.2 (ref 1.2–2.2)
Albumin: 5 g/dL — ABNORMAL HIGH (ref 3.8–4.9)
Alkaline Phosphatase: 132 IU/L — ABNORMAL HIGH (ref 39–117)
BUN/Creatinine Ratio: 16 (ref 9–23)
BUN: 11 mg/dL (ref 6–24)
Bilirubin Total: 0.5 mg/dL (ref 0.0–1.2)
CO2: 20 mmol/L (ref 20–29)
Calcium: 10.4 mg/dL — ABNORMAL HIGH (ref 8.7–10.2)
Chloride: 101 mmol/L (ref 96–106)
Creatinine, Ser: 0.68 mg/dL (ref 0.57–1.00)
GFR calc Af Amer: 111 mL/min/{1.73_m2} (ref >59)
GFR calc non Af Amer: 96 mL/min/{1.73_m2} (ref >59)
Globulin, Total: 2.3 g/dL (ref 1.5–4.5)
Glucose: 166 mg/dL — ABNORMAL HIGH (ref 65–99)
Potassium: 4.4 mmol/L (ref 3.5–5.2)
Sodium: 139 mmol/L (ref 134–144)
Total Protein: 7.3 g/dL (ref 6.0–8.5)

## 2018-07-22 LAB — LIPID PANEL
Chol/HDL Ratio: 4.4 ratio (ref 0.0–4.4)
Cholesterol, Total: 164 mg/dL (ref 100–199)
HDL: 37 mg/dL — ABNORMAL LOW (ref >39)
LDL Calculated: 83 mg/dL (ref 0–99)
Triglycerides: 218 mg/dL — ABNORMAL HIGH (ref 0–149)
VLDL Cholesterol Cal: 44 mg/dL — ABNORMAL HIGH (ref 5–40)

## 2018-08-04 ENCOUNTER — Ambulatory Visit: Payer: 59 | Attending: Family Medicine | Admitting: Physical Therapy

## 2018-08-04 ENCOUNTER — Encounter: Payer: Self-pay | Admitting: Physical Therapy

## 2018-08-04 ENCOUNTER — Other Ambulatory Visit: Payer: Self-pay

## 2018-08-04 DIAGNOSIS — M6281 Muscle weakness (generalized): Secondary | ICD-10-CM | POA: Insufficient documentation

## 2018-08-04 NOTE — Therapy (Signed)
[place] [place], [place], [address] Phone: [phone]   Fax:  [phone]  Physical Therapy Evaluation  Patient Details  Name: Erin Lawson MRN: [id number] Date of Birth: August 01, 1960 Referring Provider (PT): Burnett Harry   Encounter Date: 08/04/2018  PT End of Session - 08/04/18 1516    Visit Number  1    Date for PT Re-Evaluation  10/05/18    PT Start Time  1440    PT Stop Time  1530    PT Time Calculation (min)  50 min    Activity Tolerance  Patient tolerated treatment well    Behavior During Therapy  [facility] for tasks assessed/performed       Past Medical History:  Diagnosis Date  . DM type 2 (diabetes mellitus, type 2) ([place]) 05/18/2012  . Hypertension     Past Surgical History:  Procedure Laterality Date  . CESAREAN SECTION    . utreine polyp     resected    There were no vitals filed for this visit.   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  She reports that she had to go to the ED because she felt somthing "tear"    Limitations  Lifting    Diagnostic tests  C/T of the abdomen shows a "sports hernia" lower right    Patient Stated Goals  return to exercise and normal with no pain    Currently in Pain?  Yes    Pain Score  0-No pain    Pain Location  Abdomen    Pain Orientation  Right;Lower    Pain Descriptors / Indicators  Spasm;Sharp    Pain Type  Acute pain    Pain Radiating Towards  denies    Pain Onset  More than a month ago    Pain Frequency  Constant    Aggravating Factors   worse wiht lifting and stretching pain up to 6/10 and she stops the activity, she reports that she has really limited all of her activities    Pain Relieving  Factors  avoiding movements, using good body mechanics pain can be a 1/10    Effect of Pain on Daily Activities  reports that  she really avoids a lot of movements, but has not tried to do any phsyical activities         [facility] PT Assessment - 08/04/18 0001      Assessment   Medical Diagnosis  abdominal strain    Referring Provider (PT)  Z. Stallings    Onset Date/Surgical Date  07/05/18      Precautions   Precautions  None      Balance Screen   Has the patient fallen in the past 6 months  No    Has the patient had a decrease in activity level because of a fear of falling?   No    Is the patient reluctant to leave their home because of a fear of falling?   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, training requires physical fitness tests and lifting    Leisure  not exercising       Posture/Postural Control   Posture Comments  fwd head, rounded shoulders      ROM / Strength   AROM / PROM / Strength  AROM;Strength      AROM   Overall AROM Comments  Lumbar flexion decreased 50%, other motions decreased 25% no increase of pain with this      Strength   Overall Strength Comments  hips 4-/5 reports fear of the pain is limiting her right now, patient having a great deal of difficulty activating the abs, she reports fear      Transfers   Comments  very difficult to get up as she does not use her abs      Ambulation/Gait   Gait Comments  small steps                Objective measurements completed on examination: See above findings.      [facility] Adult PT Treatment/Exercise - 08/04/18 0001      Modalities   Modalities  Electrical Stimulation;Moist Heat      Moist Heat Therapy   Number Minutes Moist Heat  13 Minutes    Moist Heat Location  --   abdomen      Electrical Stimulation   Electrical Stimulation Location  right lower abdomen    Electrical Stimulation Action  IFC    Electrical Stimulation Parameters  supine    Electrical Stimulation Goals  Pain             PT Education - 08/04/18 1515    Education Details  Wms flexion    Person(s) Educated  Patient    Methods  Explanation;Handout;Demonstration    Comprehension  Verbalized understanding          PT Long Term Goals - 08/04/18 1527      PT LONG TERM GOAL #1   Title  indepednent with HEP    Time  8    Period  Weeks    Status  New      PT LONG TERM GOAL #2   Title  decrease pain 50% with getting up from bed    Time  8    Period  Weeks    Status  New      PT LONG TERM GOAL #3   Title  tolerate lifting 40 #    Time  8    Period  Weeks    Status  New      PT LONG TERM GOAL #4   Title  increase ROM to WNL's    Time  8    Period  Weeks    Status  New      PT LONG TERM GOAL #5   Title  understand posture and body mechnaics    Time  8    Period  Weeks    Status  New             Plan - 08/04/18 1517    Clinical Impression Statement  Patient injured her right lower abdominal area during training for her job in June, she is a Production assistant, radio.  CT scan shows a "sports hernia"  At this point she is very fearful of palpation in the abdomen area, I also cannot get her to engage or activate the abdominal  mms, she seems very scared and tells me she remembers how bad it hurts, she reports that the next time she will have to do something active with her job is September.  She has limited ROM of the lumbar spine but the biggest issue is I cannot get her to activate the abdominals, she has a hard time with bed mobility due to this    Stability/Clinical Decision Making  Stable/Uncomplicated    Clinical Decision Making  Low    Rehab Potential  Good    PT Frequency  2x / week    PT Duration  8 weeks    PT Treatment/Interventions  ADLs/Self Care Home  Management;Electrical Stimulation;Cryotherapy;Moist Heat;Ultrasound;Therapeutic activities;Therapeutic exercise;Manual techniques;Patient/family education    PT Next Visit Plan  add gym activites, try to get her comfortable moving    Consulted and Agree with Plan of Care  Patient       Patient will benefit from skilled therapeutic intervention in order to improve the following deficits and impairments:  Abnormal gait, Improper body mechanics, Pain, Postural dysfunction, Increased muscle spasms, Decreased range of motion, Decreased strength, Impaired flexibility  Visit Diagnosis: 1. Muscle weakness (generalized)        Problem List Patient Active Problem List   Diagnosis Date Noted  . Vitamin D deficiency 12/12/2017  . Primary osteoarthritis of left knee 12/12/2017  . Chest pain 05/18/2012  . Mild anemia 05/18/2012  . DM type 2 (diabetes mellitus, type 2) ([place]) 05/18/2012  . Essential hypertension 04/06/2007  . KELOID 04/06/2007    Sumner Boast., PT 08/04/2018, 3:40 PM  [place] [address] [place], [place], [address] Phone: [phone]   Fax:  [phone]  Name: Erin Lawson MRN: [id number] Date of Birth: [birth year]

## 2018-08-06 ENCOUNTER — Ambulatory Visit: Payer: 59 | Admitting: Physical Therapy

## 2018-08-11 ENCOUNTER — Ambulatory Visit: Payer: 59 | Admitting: Physical Therapy

## 2018-08-11 ENCOUNTER — Encounter: Payer: Self-pay | Admitting: Physical Therapy

## 2018-08-11 ENCOUNTER — Other Ambulatory Visit: Payer: Self-pay

## 2018-08-11 DIAGNOSIS — M6281 Muscle weakness (generalized): Secondary | ICD-10-CM

## 2018-08-11 NOTE — Therapy (Signed)
[place], [place], [address] Phone: [phone]   Fax:  [phone]  Physical Therapy Treatment  Patient Details  Name: Erin Lawson MRN: [id number] Date of Birth: January 20, 1960 Referring Provider (PT): Burnett Harry   Encounter Date: 08/11/2018  PT End of Session - 08/11/18 0829    Visit Number  2    Date for PT Re-Evaluation  10/05/18    PT Start Time  0751    PT Stop Time  0844    PT Time Calculation (min)  53 min    Activity Tolerance  Patient tolerated treatment well    Behavior During Therapy  [facility] for tasks assessed/performed       Past Medical History:  Diagnosis Date  . DM type 2 (diabetes mellitus, type 2) ([place]) 05/18/2012  . Hypertension     Past Surgical History:  Procedure Laterality Date  . CESAREAN SECTION    . utreine polyp     resected    There were no vitals filed for this visit.  Subjective Assessment - 08/11/18 0754    Subjective  Patient reports that she worked last night.  REports that she did not do the exercises    Currently in Pain?  Yes    Pain Score  4     Pain Location  Abdomen    Pain Orientation  Right;Lower    Pain Descriptors / Indicators  Sore                       [facility] Adult PT Treatment/Exercise - 08/11/18 0001      Exercises   Exercises  Lumbar      Lumbar Exercises: Aerobic   Nustep  level 4 x 5 minutes      Lumbar Exercises: Machines for Strengthening   Cybex Knee Extension  5# 3sets 5 really difficult with weak legs, no pain    Cybex Knee Flexion  15# 2x10    Other Lumbar Machine Exercise  10# rows, 15# lats 2x10      Lumbar Exercises: Standing   Shoulder Extension  Both;20 reps;Theraband    Theraband Level (Shoulder Extension)  Level 1 (Yellow)      Lumbar Exercises: Supine   Ab Set  15 reps    Heel Slides  20 reps    Heel Slides Limitations  use of sliding board    Other Supine Lumbar Exercises  reviewed  and performed HEP as she had questions on how to do    Other Supine Lumbar Exercises  feet on ball K2C, trunk rotation, small bridge and isometric abs      Modalities   Modalities  Electrical Stimulation;Moist Heat      Moist Heat Therapy   Number Minutes Moist Heat  12 Minutes    Moist Heat Location  --   right lower abdomen     Electrical Stimulation   Electrical Stimulation Location  right lower abdomen    Electrical Stimulation Action  IFC    Electrical Stimulation Parameters  supine    Electrical Stimulation Goals  Pain                  PT Long Term Goals - 08/11/18 0831      PT LONG TERM GOAL #1   Title  indepednent with HEP    Status  Partially Met            Plan -  08/11/18 0830    Clinical Impression Statement  Patient is very tender to any palpation, she is sensitive to any touch and very jumpy.  She is hesitant for all exercises and activities.  She did seem to tolerate the activities without issue or incresae of pain, she did have some difficulty with activating the abs    PT Next Visit Plan  see how the gym activities did and progress as tolerated    Consulted and Agree with Plan of Care  Patient       Patient will benefit from skilled therapeutic intervention in order to improve the following deficits and impairments:  Abnormal gait, Improper body mechanics, Pain, Postural dysfunction, Increased muscle spasms, Decreased range of motion, Decreased strength, Impaired flexibility  Visit Diagnosis: 1. Muscle weakness (generalized)        Problem List Patient Active Problem List   Diagnosis Date Noted  . Vitamin D deficiency 12/12/2017  . Primary osteoarthritis of left knee 12/12/2017  . Chest pain 05/18/2012  . Mild anemia 05/18/2012  . DM type 2 (diabetes mellitus, type 2) ([place]) 05/18/2012  . Essential hypertension 04/06/2007  . KELOID 04/06/2007    Sumner Boast., PT 08/11/2018, 8:32 AM  [place] [address] [place] [place], [place], [address] Phone: [phone]   Fax:  [phone]  Name: Erin Lawson MRN: [id number] Date of Birth: 1961/01/15

## 2018-08-14 ENCOUNTER — Ambulatory Visit: Payer: 59 | Admitting: Physical Therapy

## 2018-08-14 ENCOUNTER — Other Ambulatory Visit: Payer: Self-pay

## 2018-08-14 DIAGNOSIS — M6281 Muscle weakness (generalized): Secondary | ICD-10-CM

## 2018-08-14 NOTE — Therapy (Signed)
[place], [place], [address] Phone: [phone]   Fax:  [phone]  Physical Therapy Treatment  Patient Details  Name: Erin Lawson MRN: [id number] Date of Birth: 1960/07/31 Referring Provider (PT): Burnett Harry   Encounter Date: 08/14/2018  PT End of Session - 08/14/18 0822    Visit Number  3    Date for PT Re-Evaluation  10/05/18    PT Start Time  0753    PT Stop Time  0840    PT Time Calculation (min)  47 min       Past Medical History:  Diagnosis Date  . DM type 2 (diabetes mellitus, type 2) ([place]) 05/18/2012  . Hypertension     Past Surgical History:  Procedure Laterality Date  . CESAREAN SECTION    . utreine polyp     resected    There were no vitals filed for this visit.  Subjective Assessment - 08/14/18 0757    Subjective  pt reports feeling okay after last session    Currently in Pain?  Yes    Pain Score  2     Pain Location  Abdomen    Pain Orientation  Right;Lower                       [facility] Adult PT Treatment/Exercise - 08/14/18 0001      Lumbar Exercises: Aerobic   Nustep  level 4 x 5 minutes      Lumbar Exercises: Machines for Strengthening   Cybex Knee Extension  5# 3sets 5    difficult for pt, quad weakness noted    Cybex Knee Flexion  15# 2x10    Other Lumbar Machine Exercise   15# lats and rows 2x10    Other Lumbar Machine Exercise  lumb flex with red tband 2 sets 10      Lumbar Exercises: Seated   Other Seated Lumbar Exercises  pelvic ROM on dyna disc 10 each, 4 way    Other Seated Lumbar Exercises  LAQ and hip flexion on dyna disc 10 each      Lumbar Exercises: Supine   Other Supine Lumbar Exercises  feet on ball K2C, trunk rotation, small bridge and isometric abs      Modalities   Modalities  Electrical Stimulation;Moist Heat      Moist Heat Therapy   Number Minutes Moist Heat  15 Minutes    Moist Heat Location  --   loer  abdominal     Electrical Stimulation   Electrical Stimulation Location  right lower abdomen    Electrical Stimulation Action  IFC    Electrical Stimulation Parameters  supine    Electrical Stimulation Goals  Pain                  PT Long Term Goals - 08/11/18 0831      PT LONG TERM GOAL #1   Title  indepednent with HEP    Status  Partially Met            Plan - 08/14/18 8832    Clinical Impression Statement  pt very able to engage core and abdominals better with exercise,c/o minimal pain but still guarded to touch. pt with quad weakness and compensation noted. pt verb doing HEP and getting better    PT Treatment/Interventions  ADLs/Self Care Home Management;Electrical Stimulation;Cryotherapy;Moist Heat;Ultrasound;Therapeutic activities;Therapeutic exercise;Manual techniques;Patient/family education    PT Next Visit Plan  core and quad stregthening       Patient will benefit from skilled therapeutic intervention in order to improve the following deficits and impairments:  Abnormal gait, Improper body mechanics, Pain, Postural dysfunction, Increased muscle spasms, Decreased range of motion, Decreased strength, Impaired flexibility  Visit Diagnosis: 1. Muscle weakness (generalized)        Problem List Patient Active Problem List   Diagnosis Date Noted  . Vitamin D deficiency 12/12/2017  . Primary osteoarthritis of left knee 12/12/2017  . Chest pain 05/18/2012  . Mild anemia 05/18/2012  . DM type 2 (diabetes mellitus, type 2) ([place]) 05/18/2012  . Essential hypertension 04/06/2007  . KELOID 04/06/2007    PAYSEUR,ANGIE PTA 08/14/2018, 8:24 AM  [address] [place], [place], [address] Phone: [phone]   Fax:  [phone]  Name: AVIA MERKLEY MRN: [id number] Date of Birth: 1960/12/05

## 2018-08-17 ENCOUNTER — Encounter: Payer: Self-pay | Admitting: Physical Therapy

## 2018-08-17 ENCOUNTER — Other Ambulatory Visit: Payer: Self-pay

## 2018-08-17 ENCOUNTER — Ambulatory Visit: Payer: 59 | Attending: Family Medicine | Admitting: Physical Therapy

## 2018-08-17 DIAGNOSIS — M6281 Muscle weakness (generalized): Secondary | ICD-10-CM | POA: Insufficient documentation

## 2018-08-17 NOTE — Therapy (Signed)
[place], [place], [address] Phone: [phone]   Fax:  [phone]  Physical Therapy Treatment  Patient Details  Name: Erin Lawson MRN: [id number] Date of Birth: 05/13/1960 Referring Provider (PT): Burnett Harry   Encounter Date: 08/17/2018  PT End of Session - 08/17/18 1342    Visit Number  4    Date for PT Re-Evaluation  10/05/18    PT Start Time  1300    PT Stop Time  1357    PT Time Calculation (min)  57 min    Activity Tolerance  Patient tolerated treatment well    Behavior During Therapy  [facility] for tasks assessed/performed       Past Medical History:  Diagnosis Date  . DM type 2 (diabetes mellitus, type 2) ([place]) 05/18/2012  . Hypertension     Past Surgical History:  Procedure Laterality Date  . CESAREAN SECTION    . utreine polyp     resected    There were no vitals filed for this visit.  Subjective Assessment - 08/17/18 1302    Subjective  "A lot better"    Diagnostic tests  C/T of the abdomen shows a "sports hernia" lower right    Patient Stated Goals  return to exercise and normal with no pain                       [facility] Adult PT Treatment/Exercise - 08/17/18 0001      Lumbar Exercises: Aerobic   Nustep  level 4 x 6 minutes      Lumbar Exercises: Machines for Strengthening   Cybex Knee Extension  5# 3sets 5 really difficult with weak legs, no pain    Cybex Knee Flexion  15# 2x10    Other Lumbar Machine Exercise   15# lats and rows 2x10      Lumbar Exercises: Standing   Shoulder Extension  Both;20 reps;Theraband    Theraband Level (Shoulder Extension)  Level 1 (Yellow)    Other Standing Lumbar Exercises  standing march 2x10       Lumbar Exercises: Seated   Long Arc Quad on Chair  Both;1 set;10 reps;Strengthening    LAQ on Chair Weights (lbs)  3    Sit to Stand  10 reps;Other (comment)   x2 holding red ball      Lumbar Exercises: Supine   Bridge  Compliant;10 reps;2 seconds    Other Supine Lumbar Exercises  feet on ball K2C, trunk rotation, small bridge and isometric abs      Modalities   Modalities  Electrical Stimulation;Moist Heat      Moist Heat Therapy   Number Minutes Moist Heat  15 Minutes    Moist Heat Location  Other (comment)   LRQ     Electrical Stimulation   Electrical Stimulation Location  right lower abdomen    Electrical Stimulation Action  IFC    Electrical Stimulation Parameters  supine    Electrical Stimulation Goals  Pain                  PT Long Term Goals - 08/11/18 0831      PT LONG TERM GOAL #1   Title  indepednent with HEP    Status  Partially Met            Plan - 08/17/18 1342    Clinical Impression Statement  Quad weakness remains with seated leg  extensions. She did better with seated LAQ. She did report a small increase in abdomen pain after extensions. Cues to engage core with standing march.    Stability/Clinical Decision Making  Stable/Uncomplicated    Rehab Potential  Good    PT Frequency  2x / week    PT Treatment/Interventions  ADLs/Self Care Home Management;Electrical Stimulation;Cryotherapy;Moist Heat;Ultrasound;Therapeutic activities;Therapeutic exercise;Manual techniques;Patient/family education    PT Next Visit Plan  core and quad stregthening       Patient will benefit from skilled therapeutic intervention in order to improve the following deficits and impairments:  Abnormal gait, Improper body mechanics, Pain, Postural dysfunction, Increased muscle spasms, Decreased range of motion, Decreased strength, Impaired flexibility  Visit Diagnosis: 1. Muscle weakness (generalized)        Problem List Patient Active Problem List   Diagnosis Date Noted  . Vitamin D deficiency 12/12/2017  . Primary osteoarthritis of left knee 12/12/2017  . Chest pain 05/18/2012  . Mild anemia 05/18/2012  . DM type 2 (diabetes mellitus, type 2) ([place]) 05/18/2012  .  Essential hypertension 04/06/2007  . KELOID 04/06/2007    Scot Jun, PTA 08/17/2018, 1:45 PM  [address] [place], [place], [address] Phone: [phone]   Fax:  [phone]  Name: Erin Lawson MRN: [id number] Date of Birth: 1960/06/02

## 2018-08-21 ENCOUNTER — Other Ambulatory Visit: Payer: Self-pay

## 2018-08-21 ENCOUNTER — Ambulatory Visit: Payer: 59 | Admitting: Physical Therapy

## 2018-08-21 ENCOUNTER — Encounter: Payer: Self-pay | Admitting: Physical Therapy

## 2018-08-21 DIAGNOSIS — M6281 Muscle weakness (generalized): Secondary | ICD-10-CM | POA: Diagnosis not present

## 2018-08-21 NOTE — Therapy (Signed)
[place], [place], [address] Phone: [phone]   Fax:  [phone]  Physical Therapy Treatment  Patient Details  Name: Erin Lawson MRN: [id number] Date of Birth: 1961/01/07 Referring Provider (PT): Burnett Harry   Encounter Date: 08/21/2018  PT End of Session - 08/21/18 0838    Visit Number  5    Date for PT Re-Evaluation  10/05/18    PT Start Time  3846    PT Stop Time  0852    PT Time Calculation (min)  57 min       Past Medical History:  Diagnosis Date  . DM type 2 (diabetes mellitus, type 2) ([place]) 05/18/2012  . Hypertension     Past Surgical History:  Procedure Laterality Date  . CESAREAN SECTION    . utreine polyp     resected    There were no vitals filed for this visit.  Subjective Assessment - 08/21/18 0757    Subjective  Pt reports that she is feeling well this morning    Patient Stated Goals  return to exercise and normal with no pain    Currently in Pain?  No/denies         [facility] PT Assessment - 08/21/18 0001      AROM   Overall AROM Comments  Lumbar flexion and ext  decreased 25%, Other motions WNL                   [facility] Adult PT Treatment/Exercise - 08/21/18 0001      Lumbar Exercises: Aerobic   Nustep  level 4 x 6 minutes      Lumbar Exercises: Machines for Strengthening   Cybex Knee Extension  5# 2sets 5 really difficult with weak legs, no pain    Cybex Knee Flexion  15# 2x10    Other Lumbar Machine Exercise   20# lats and rows 2x10      Lumbar Exercises: Standing   Shoulder Extension  Both;20 reps;Theraband    Theraband Level (Shoulder Extension)  Level 2 (Red)    Other Standing Lumbar Exercises  standing march 3lb x10, no weight x 10      Lumbar Exercises: Seated   Long Arc Quad on Chair  Both;10 reps;Strengthening;2 sets    LAQ on Chair Weights (lbs)  3    Sit to Stand  10 reps;Other (comment)   x2 OHP with red ball      Lumbar Exercises: Supine   Other Supine Lumbar Exercises  feet on ball K2C, trunk rotation, small bridge and isometric abs      Modalities   Modalities  Electrical Stimulation;Moist Heat      Moist Heat Therapy   Number Minutes Moist Heat  15 Minutes    Moist Heat Location  --   LRQ     Electrical Stimulation   Electrical Stimulation Location  right lower abdomen    Electrical Stimulation Action  IFC    Electrical Stimulation Parameters  supine    Electrical Stimulation Goals  Pain                  PT Long Term Goals - 08/21/18 6599      PT LONG TERM GOAL #1   Title  indepednent with HEP    Status  Achieved      PT LONG TERM GOAL #2   Title  decrease pain 50% with getting up from bed  Status  Partially Met      PT LONG TERM GOAL #3   Title  tolerate lifting 40 #      PT LONG TERM GOAL #5   Title  understand posture and body mechnaics    Status  Partially Met            Plan - 08/21/18 0839    Clinical Impression Statement  Pt has progressed towards goals. She has increase her lumbar ROM. No reports of pain with today's interventions. Increased resistance tolerated with seated rows and lats. Again cues to engage core with supine exercises.    Stability/Clinical Decision Making  Stable/Uncomplicated    Rehab Potential  Good    PT Frequency  2x / week    PT Duration  8 weeks    PT Next Visit Plan  core and quad strengthening       Patient will benefit from skilled therapeutic intervention in order to improve the following deficits and impairments:  Abnormal gait, Improper body mechanics, Pain, Postural dysfunction, Increased muscle spasms, Decreased range of motion, Decreased strength, Impaired flexibility  Visit Diagnosis: 1. Muscle weakness (generalized)        Problem List Patient Active Problem List   Diagnosis Date Noted  . Vitamin D deficiency 12/12/2017  . Primary osteoarthritis of left knee 12/12/2017  . Chest pain 05/18/2012  .  Mild anemia 05/18/2012  . DM type 2 (diabetes mellitus, type 2) ([place]) 05/18/2012  . Essential hypertension 04/06/2007  . KELOID 04/06/2007    Scot Jun, PTA 08/21/2018, 8:40 AM  [place] [address] [place], [place], [address] Phone: [phone]   Fax:  [phone]  Name: Erin Lawson MRN: [id number] Date of Birth: November 10, 1960

## 2018-08-25 ENCOUNTER — Encounter: Payer: Self-pay | Admitting: Physical Therapy

## 2018-08-25 ENCOUNTER — Telehealth: Payer: Self-pay | Admitting: Family Medicine

## 2018-08-25 ENCOUNTER — Ambulatory Visit: Payer: 59 | Admitting: Physical Therapy

## 2018-08-25 ENCOUNTER — Other Ambulatory Visit: Payer: Self-pay

## 2018-08-25 DIAGNOSIS — M6281 Muscle weakness (generalized): Secondary | ICD-10-CM

## 2018-08-25 NOTE — Therapy (Signed)
[place], [place], [address] Phone: [phone]   Fax:  [phone]  Physical Therapy Treatment  Patient Details  Name: Erin Lawson MRN: [id number] Date of Birth: 03-12-1960 Referring Provider (PT): Burnett Harry   Encounter Date: 08/25/2018  PT End of Session - 08/25/18 0842    Visit Number  6    Date for PT Re-Evaluation  10/05/18    PT Start Time  0758    PT Stop Time  0856    PT Time Calculation (min)  58 min    Activity Tolerance  Patient tolerated treatment well    Behavior During Therapy  [facility] for tasks assessed/performed       Past Medical History:  Diagnosis Date  . DM type 2 (diabetes mellitus, type 2) ([place]) 05/18/2012  . Hypertension     Past Surgical History:  Procedure Laterality Date  . CESAREAN SECTION    . utreine polyp     resected    There were no vitals filed for this visit.  Subjective Assessment - 08/25/18 0758    Subjective  "I feel better" no pain now, pt reports that's he pain comes and goes    Limitations  Lifting    Diagnostic tests  C/T of the abdomen shows a "sports hernia" lower right    Patient Stated Goals  return to exercise and normal with no pain    Currently in Pain?  No/denies                       [facility] Adult PT Treatment/Exercise - 08/25/18 0001      Lumbar Exercises: Aerobic   Recumbent Bike  L0 x 3 min     Nustep  level 4 x 6 minutes      Lumbar Exercises: Machines for Strengthening   Cybex Knee Extension  5# 2sets 5 really difficult with weak legs, no pain    Cybex Knee Flexion  20# 2x10    Other Lumbar Machine Exercise   20# lats and rows 2x10      Lumbar Exercises: Standing   Row  Both;Theraband;20 reps    Theraband Level (Row)  Level 2 (Red)    Shoulder Extension  Both;20 reps;Theraband    Theraband Level (Shoulder Extension)  Level 2 (Red)    Shoulder Extension Limitations  --    Other Standing Lumbar  Exercises  standing march 2x10      Lumbar Exercises: Seated   Sit to Stand  10 reps;Other (comment)   x2, OHP red ball      Lumbar Exercises: Supine   Other Supine Lumbar Exercises  feet on ball K2C, trunk rotation, small bridge and isometric abs      Modalities   Modalities  Electrical Stimulation;Moist Heat      Moist Heat Therapy   Number Minutes Moist Heat  15 Minutes    Moist Heat Location  --   LRQ     Electrical Stimulation   Electrical Stimulation Location  right lower abdomen    Electrical Stimulation Action  IFC    Electrical Stimulation Parameters  supine    Electrical Stimulation Goals  Pain                  PT Long Term Goals - 08/21/18 0811      PT LONG TERM GOAL #1   Title  indepednent with HEP    Status  Achieved      PT LONG TERM GOAL #2   Title  decrease pain 50% with getting up from bed    Status  Partially Met      PT LONG TERM GOAL #3   Title  tolerate lifting 40 #      PT LONG TERM GOAL #5   Title  understand posture and body mechnaics    Status  Partially Met            Plan - 08/25/18 0843    Clinical Impression Statement  Pt did well in her therapy session today. She was able to tolerate increase resistance with seated leg curls and Rows. She did report feeling it after leg extensions. No functional limitations at home reported. Updated her HEP.    Stability/Clinical Decision Making  Stable/Uncomplicated    Rehab Potential  Good    PT Frequency  2x / week    PT Treatment/Interventions  ADLs/Self Care Home Management;Electrical Stimulation;Cryotherapy;Moist Heat;Ultrasound;Therapeutic activities;Therapeutic exercise;Manual techniques;Patient/family education    PT Next Visit Plan  core and quad strengthening       Patient will benefit from skilled therapeutic intervention in order to improve the following deficits and impairments:  Abnormal gait, Improper body mechanics, Pain, Postural dysfunction, Increased muscle spasms,  Decreased range of motion, Decreased strength, Impaired flexibility  Visit Diagnosis: 1. Muscle weakness (generalized)        Problem List Patient Active Problem List   Diagnosis Date Noted  . Vitamin D deficiency 12/12/2017  . Primary osteoarthritis of left knee 12/12/2017  . Chest pain 05/18/2012  . Mild anemia 05/18/2012  . DM type 2 (diabetes mellitus, type 2) ([place]) 05/18/2012  . Essential hypertension 04/06/2007  . KELOID 04/06/2007    Scot Jun, PTA 08/25/2018, 8:46 AM  [address] [place], [place], [address] Phone: [phone]   Fax:  [phone]  Name: Erin Lawson MRN: [id number] Date of Birth: 09-21-1960

## 2018-08-25 NOTE — Telephone Encounter (Signed)
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 Office visit needed? Please advise. Last seen in July

## 2018-08-25 NOTE — Patient Instructions (Signed)
Access Code: [email or web address]  URL: [email or web address]  Date: 08/25/2018  Prepared by: Cheri Fowler   Exercises  Standing Row with Resistance - 10 reps - 3 sets - 1x daily - 7x weekly  Shoulder extension with resistance - Neutral - 10 reps - 3 sets - 1x daily - 7x weekly  March in Place - 10 reps - 3 sets - 1x daily - 7x weekly  Supine Bridge - 10 reps - 3 sets - 1x daily - 7x weekly

## 2018-08-26 NOTE — Telephone Encounter (Signed)
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 Do you approve?

## 2018-08-27 NOTE — Telephone Encounter (Signed)
Please mail a letter stating   Patient has been referred to physical therapy and outpatient rehabilitation for her gait abnormalities and her generalized muscle weakness.  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

## 2018-08-27 NOTE — Telephone Encounter (Signed)
Letter mailed

## 2018-08-28 ENCOUNTER — Ambulatory Visit: Payer: 59 | Admitting: Physical Therapy

## 2018-08-28 ENCOUNTER — Other Ambulatory Visit: Payer: Self-pay

## 2018-08-28 DIAGNOSIS — M6281 Muscle weakness (generalized): Secondary | ICD-10-CM

## 2018-08-28 NOTE — Therapy (Signed)
[place], [place], [address] Phone: [phone]   Fax:  [phone]  Physical Therapy Treatment  Patient Details  Name: Erin Lawson MRN: [id number] Date of Birth: April 04, 1960 Referring Provider (PT): Burnett Harry   Encounter Date: 08/28/2018  PT End of Session - 08/28/18 0830    Visit Number  7    Date for PT Re-Evaluation  10/05/18    PT Start Time  4854    PT Stop Time  0852    PT Time Calculation (min)  57 min       Past Medical History:  Diagnosis Date  . DM type 2 (diabetes mellitus, type 2) ([place]) 05/18/2012  . Hypertension     Past Surgical History:  Procedure Laterality Date  . CESAREAN SECTION    . utreine polyp     resected    There were no vitals filed for this visit.  Subjective Assessment - 08/28/18 0755    Subjective  i think estim was too high last time, I was kinda sore. Pt reports overall 90% better    Currently in Pain?  No/denies                       [facility] Adult PT Treatment/Exercise - 08/28/18 0001      Lumbar Exercises: Aerobic   Nustep  L 6 6 min      Lumbar Exercises: Machines for Strengthening   Cybex Lumbar Extension  black tband flex/ext 2 sets 10    Cybex Knee Extension  5# 3 sets 5   continues to be very difficult for pt d/t weakness   Cybex Knee Flexion  20# 2x10    Other Lumbar Machine Exercise   20# lats and rows 2x10      Lumbar Exercises: Standing   Other Standing Lumbar Exercises  20# mat to floor lifting 5 times- c/o back pain and weakness   20# carry 25 feet 2 times- abd pain     Lumbar Exercises: Seated   Sit to Stand  20 reps   wt ball 10 chest press, 10 OH     Lumbar Exercises: Supine   Other Supine Lumbar Exercises  feet on ball K2C, trunk rotation,  bridge and isometric abs 15 times each      Modalities   Modalities  Electrical Stimulation;Moist Heat      Moist Heat Therapy   Number Minutes Moist Heat   15 Minutes    Moist Heat Location  --   stomach     Electrical Stimulation   Electrical Stimulation Location  right lower abdomen    Electrical Stimulation Action  IFC    Electrical Stimulation Parameters  supine    Electrical Stimulation Goals  Pain                  PT Long Term Goals - 08/28/18 0757      PT LONG TERM GOAL #2   Title  decrease pain 50% with getting up from bed    Status  Achieved      PT LONG TERM GOAL #3   Title  tolerate lifting 40 #    Status  On-going      PT LONG TERM GOAL #4   Title  increase ROM to WNL's    Status  Partially Met      PT LONG TERM GOAL #5   Title  understand posture and  body mechnaics    Status  Partially Met            Plan - 08/28/18 0830    Clinical Impression Statement  added lifting and resisted core stab ex today and pt did fair, had some increased pain and weakness. pt quads continue to be very weak an dnoted with lifting. cuing needed with ex for good BM and postue    PT Next Visit Plan  core and quad stregthening. LIFTING       Patient will benefit from skilled therapeutic intervention in order to improve the following deficits and impairments:  Abnormal gait, Improper body mechanics, Pain, Postural dysfunction, Increased muscle spasms, Decreased range of motion, Decreased strength, Impaired flexibility  Visit Diagnosis: 1. Muscle weakness (generalized)        Problem List Patient Active Problem List   Diagnosis Date Noted  . Vitamin D deficiency 12/12/2017  . Primary osteoarthritis of left knee 12/12/2017  . Chest pain 05/18/2012  . Mild anemia 05/18/2012  . DM type 2 (diabetes mellitus, type 2) ([place]) 05/18/2012  . Essential hypertension 04/06/2007  . KELOID 04/06/2007    Bodhi Stenglein,ANGIE PTA 08/28/2018, 8:32 AM  [place] [address] [place], [place], [address] Phone: [phone]   Fax:  [phone]  Name: CAMIELLE SIZER MRN: [id number] Date of Birth: Apr 27, 1960

## 2018-08-31 ENCOUNTER — Encounter: Payer: Self-pay | Admitting: Physical Therapy

## 2018-08-31 ENCOUNTER — Other Ambulatory Visit: Payer: Self-pay

## 2018-08-31 ENCOUNTER — Ambulatory Visit: Payer: 59 | Admitting: Physical Therapy

## 2018-08-31 DIAGNOSIS — M6281 Muscle weakness (generalized): Secondary | ICD-10-CM | POA: Diagnosis not present

## 2018-08-31 NOTE — Therapy (Signed)
[place], [place], [address] Phone: [phone]   Fax:  [phone]  Physical Therapy Treatment  Patient Details  Name: Erin Lawson MRN: [id number] Date of Birth: April 27, 1960 Referring Provider (PT): Burnett Harry   Encounter Date: 08/31/2018  PT End of Session - 08/31/18 0837    Visit Number  8    Date for PT Re-Evaluation  10/05/18    PT Start Time  0759    PT Stop Time  0852    PT Time Calculation (min)  53 min    Activity Tolerance  Patient tolerated treatment well    Behavior During Therapy  [facility] for tasks assessed/performed       Past Medical History:  Diagnosis Date  . DM type 2 (diabetes mellitus, type 2) ([place]) 05/18/2012  . Hypertension     Past Surgical History:  Procedure Laterality Date  . CESAREAN SECTION    . utreine polyp     resected    There were no vitals filed for this visit.  Subjective Assessment - 08/31/18 0804    Subjective  "Much better, I didn't even notice how I got out of bed this morning"    Currently in Pain?  No/denies                       [facility] Adult PT Treatment/Exercise - 08/31/18 0001      Lumbar Exercises: Aerobic   Nustep  L 6 6 min      Lumbar Exercises: Machines for Strengthening   Cybex Lumbar Extension  black tband flex/ext 2 sets 10    Cybex Knee Extension  5# 3 sets 5    Cybex Knee Flexion  20# 2x10    Other Lumbar Machine Exercise   20# lats and rows 2x10      Lumbar Exercises: Standing   Other Standing Lumbar Exercises  20lb functional box carry 30 ft x3       Lumbar Exercises: Seated   Sit to Stand  20 reps   wt ball 10 chest press 10 OHP      Lumbar Exercises: Supine   Other Supine Lumbar Exercises  feet on ball K2C, trunk rotation,  bridge and isometric abs 15 times each      Modalities   Modalities  Electrical Stimulation;Moist Heat      Moist Heat Therapy   Number Minutes Moist Heat  15 Minutes     Moist Heat Location  --   LRQ     Electrical Stimulation   Electrical Stimulation Location  right lower abdomen    Electrical Stimulation Action  IFC    Electrical Stimulation Parameters  supine    Electrical Stimulation Goals  Pain                  PT Long Term Goals - 08/31/18 0839      PT LONG TERM GOAL #1   Status  Achieved      PT LONG TERM GOAL #2   Title  decrease pain 50% with getting up from bed    Status  Achieved      PT LONG TERM GOAL #3   Title  tolerate lifting 40 #    Status  On-going      PT LONG TERM GOAL #4   Title  increase ROM to WNL's    Status  Partially Met  Plan - 08/31/18 3143    Clinical Impression Statement  Pt reports less pain overall, She also reports that box carry are easier today. Postural cues needed for seated rows. LE are still very weak with leg extensions.    Stability/Clinical Decision Making  Stable/Uncomplicated    Rehab Potential  Good    PT Frequency  2x / week    PT Duration  8 weeks    PT Treatment/Interventions  ADLs/Self Care Home Management;Electrical Stimulation;Cryotherapy;Moist Heat;Ultrasound;Therapeutic activities;Therapeutic exercise;Manual techniques;Patient/family education    PT Next Visit Plan  core and quad strengthening. LIFTING       Patient will benefit from skilled therapeutic intervention in order to improve the following deficits and impairments:  Abnormal gait, Improper body mechanics, Pain, Postural dysfunction, Increased muscle spasms, Decreased range of motion, Decreased strength, Impaired flexibility  Visit Diagnosis: 1. Muscle weakness (generalized)        Problem List Patient Active Problem List   Diagnosis Date Noted  . Vitamin D deficiency 12/12/2017  . Primary osteoarthritis of left knee 12/12/2017  . Chest pain 05/18/2012  . Mild anemia 05/18/2012  . DM type 2 (diabetes mellitus, type 2) ([place]) 05/18/2012  . Essential hypertension 04/06/2007  . KELOID  04/06/2007    Scot Jun, PTA 08/31/2018, 8:39 AM  [place] [address] [place], [place], [address] Phone: [phone]   Fax:  [phone]  Name: Erin Lawson MRN: [id number] Date of Birth: Feb 12, 1960

## 2018-09-04 ENCOUNTER — Other Ambulatory Visit: Payer: Self-pay

## 2018-09-04 ENCOUNTER — Ambulatory Visit: Payer: 59 | Admitting: Physical Therapy

## 2018-09-04 ENCOUNTER — Encounter: Payer: Self-pay | Admitting: Physical Therapy

## 2018-09-04 DIAGNOSIS — M6281 Muscle weakness (generalized): Secondary | ICD-10-CM | POA: Diagnosis not present

## 2018-09-04 NOTE — Therapy (Signed)
[place], [place], [address] Phone: [phone]   Fax:  [phone]  Physical Therapy Treatment  Patient Details  Name: Erin Lawson MRN: [id number] Date of Birth: [birth year] Referring Provider (PT): Burnett Harry   Encounter Date: 09/04/2018  PT End of Session - 09/04/18 0843    Visit Number  9    Date for PT Re-Evaluation  10/05/18    PT Start Time  0758    PT Stop Time  0855    PT Time Calculation (min)  57 min    Activity Tolerance  Patient tolerated treatment well    Behavior During Therapy  [facility] for tasks assessed/performed       Past Medical History:  Diagnosis Date  . DM type 2 (diabetes mellitus, type 2) ([place]) 05/18/2012  . Hypertension     Past Surgical History:  Procedure Laterality Date  . CESAREAN SECTION    . utreine polyp     resected    There were no vitals filed for this visit.  Subjective Assessment - 09/04/18 0757    Subjective  doesnt even notice it when getting out of bed.    Currently in Pain?  No/denies    Pain Score  0-No pain         [facility] PT Assessment - 09/04/18 0001      AROM   Overall AROM Comments  Lumbar ROM WFL, flexion limited due to HS tension                   [facility] Adult PT Treatment/Exercise - 09/04/18 0001      Lumbar Exercises: Aerobic   Nustep  L 6 6 min      Lumbar Exercises: Machines for Strengthening   Cybex Lumbar Extension  black tband flex 2 sets 10    Cybex Knee Extension  10# 2x10    Cybex Knee Flexion  25# 2x10    Leg Press  30# 2x10      Lumbar Exercises: Standing   Other Standing Lumbar Exercises  20# mat to floor lifting 5 times, lift and turn both directions    Other Standing Lumbar Exercises  20lb functional box carry 30 ft x3, 20# squat with box 8 reps       Lumbar Exercises: Seated   Other Seated Lumbar Exercises  B Tband 2x10      Lumbar Exercises: Prone   Other Prone Lumbar Exercises   planks 2x5, side planks knees bent 5 reps Bil.      Moist Heat Therapy   Number Minutes Moist Heat  15 Minutes    Moist Heat Location  Other (comment)   abdominal     Electrical Stimulation   Electrical Stimulation Location  right lower abdomen    Electrical Stimulation Action  IFC    Electrical Stimulation Parameters  supine    Electrical Stimulation Goals  Pain                  PT Long Term Goals - 09/04/18 0801      PT LONG TERM GOAL #3   Title  tolerate lifting 40 #    Status  On-going      PT LONG TERM GOAL #4   Title  increase ROM to WNL's    Status  Achieved      PT LONG TERM GOAL #5   Title  understand posture and body mechnaics  Status  Partially Met            Plan - 09/04/18 0844    Clinical Impression Statement  pt reported no pain during exercises except side plank, fatigue and muscle weakness present throughout session. tactile cues for proper form for planks. patient making progress toward all goals, still limited by lifting and quad weakness    PT Treatment/Interventions  ADLs/Self Care Home Management;Electrical Stimulation;Cryotherapy;Moist Heat;Ultrasound;Therapeutic activities;Therapeutic exercise;Manual techniques;Patient/family education    PT Next Visit Plan  core and quad strength       Patient will benefit from skilled therapeutic intervention in order to improve the following deficits and impairments:  Abnormal gait, Improper body mechanics, Pain, Postural dysfunction, Increased muscle spasms, Decreased range of motion, Decreased strength, Impaired flexibility  Visit Diagnosis: Muscle weakness (generalized)     Problem List Patient Active Problem List   Diagnosis Date Noted  . Vitamin D deficiency 12/12/2017  . Primary osteoarthritis of left knee 12/12/2017  . Chest pain 05/18/2012  . Mild anemia 05/18/2012  . DM type 2 (diabetes mellitus, type 2) ([place]) 05/18/2012  . Essential hypertension 04/06/2007  . KELOID  04/06/2007   Dylan Riggers, SPTA PAYSEUR,ANGIE PTA 09/04/2018, 8:49 AM  [place] [address] [place], [place], [address] Phone: [phone]   Fax:  [phone]  Name: Erin Lawson MRN: [id number] Date of Birth: [birth year]

## 2018-09-07 ENCOUNTER — Other Ambulatory Visit: Payer: Self-pay

## 2018-09-07 ENCOUNTER — Encounter: Payer: Self-pay | Admitting: Physical Therapy

## 2018-09-07 ENCOUNTER — Ambulatory Visit: Payer: 59 | Admitting: Physical Therapy

## 2018-09-07 DIAGNOSIS — M6281 Muscle weakness (generalized): Secondary | ICD-10-CM | POA: Diagnosis not present

## 2018-09-07 NOTE — Therapy (Signed)
[place], [place], [address] Phone: [phone]   Fax:  [phone]  Physical Therapy Treatment  Patient Details  Name: ANIELLA WANDREY MRN: [id number] Date of Birth: [birth year] Referring Provider (PT): Burnett Harry   Encounter Date: 09/07/2018  PT End of Session - 09/07/18 0831    Visit Number  10    Date for PT Re-Evaluation  10/05/18    PT Start Time  0752    PT Stop Time  0850    PT Time Calculation (min)  58 min    Activity Tolerance  Patient limited by pain    Behavior During Therapy  [facility] for tasks assessed/performed;Anxious       Past Medical History:  Diagnosis Date  . DM type 2 (diabetes mellitus, type 2) ([place]) 05/18/2012  . Hypertension     Past Surgical History:  Procedure Laterality Date  . CESAREAN SECTION    . utreine polyp     resected    There were no vitals filed for this visit.   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  REports legs and abdomen really hurts, reports that legs really hurt, had difficulty going up and down stairs    Currently in Pain?  Yes    Pain Score  4     Pain Location  Abdomen    Aggravating Factors   the last treatment really caused me to hurt  stomach pooched out again                    Objective measurements completed on examination: See above findings.      [facility] Adult PT Treatment/Exercise - 09/07/18 0001      Lumbar Exercises: Aerobic   Nustep  level 4 x 6 minutes      Lumbar Exercises: Machines for Strengthening   Cybex Knee Extension  5# 2x10    Cybex Knee Flexion  20# 2x10    Leg Press  no weight 2x10    Other Lumbar Machine Exercise   15# lats and rows 2x10      Lumbar Exercises: Standing   Other Standing Lumbar Exercises  10#  unilateral farmer's carry      Lumbar Exercises: Supine   Straight Leg Raise  10 reps;1 second    Other Supine Lumbar Exercises  Patient with a lot of quewstions about her recovery and this set back    Other Supine Lumbar Exercises  feet on ball K2C, trunk rotation,  bridge and isometric abs 15 times each      Modalities   Modalities  Electrical Stimulation;Moist Heat      Moist Heat Therapy   Number Minutes Moist Heat  15 Minutes    Moist Heat Location  Other (comment)   abdomen     Electrical Stimulation   Electrical Stimulation Location  right lower abdomen    Electrical Stimulation Action  IFC    Electrical Stimulation Parameters  supine    Electrical Stimulation Goals  Pain                  PT Long Term Goals - 09/07/18 0923      PT LONG TERM GOAL #2   Title  decrease pain 50% with getting up from bed  Status  Partially Met      PT LONG TERM GOAL #3   Title  tolerate lifting 40 #    Status  On-going             Plan - 09/07/18 0831    Clinical Impression Statement  Patient reports very sore and in a lot of pain over the weekend after the last treatment, reports "it was too much"  I really backed down on the exercises today, she c/o the abdomen "pooching out" after the last treatment, I palpated as we did exercises today and did not feel this but at rest it does feel a little more puffy on the right side, she seemed to tolerated the reduced weight well but she was anxious and slower and more deliberate with the exercises and the movements.  Seems to have had a set back    PT Next Visit Plan  be very careful, no planks, slowly rebuild and assess tolerance    Consulted and Agree with Plan of Care  Patient       Patient will benefit from skilled therapeutic intervention in order to improve the following deficits and impairments:  Abnormal gait, Improper body mechanics, Pain, Postural dysfunction, Increased muscle spasms, Decreased range of motion,  Decreased strength, Impaired flexibility  Visit Diagnosis: Muscle weakness (generalized)     Problem List Patient Active Problem List   Diagnosis Date Noted  . Vitamin D deficiency 12/12/2017  . Primary osteoarthritis of left knee 12/12/2017  . Chest pain 05/18/2012  . Mild anemia 05/18/2012  . DM type 2 (diabetes mellitus, type 2) ([place]) 05/18/2012  . Essential hypertension 04/06/2007  . KELOID 04/06/2007    Sumner Boast., PT 09/07/2018, 8:35 AM  [place] [address] [facility] [place], [place], [address] Phone: [phone]   Fax:  [phone]  Name: WANDALEE KLANG MRN: [id number] Date of Birth: 05-Jan-1961

## 2018-09-10 ENCOUNTER — Telehealth: Payer: Self-pay | Admitting: Family Medicine

## 2018-09-10 ENCOUNTER — Ambulatory Visit: Payer: 59

## 2018-09-10 NOTE — Telephone Encounter (Signed)
Pt had mammogram scheduled for today but told them she had found lump in breast .now, they need a new order for diagnostic or ultrasound. FR

## 2018-09-11 ENCOUNTER — Other Ambulatory Visit: Payer: Self-pay

## 2018-09-11 ENCOUNTER — Ambulatory Visit: Payer: 59 | Admitting: Physical Therapy

## 2018-09-11 DIAGNOSIS — M6281 Muscle weakness (generalized): Secondary | ICD-10-CM | POA: Diagnosis not present

## 2018-09-11 DIAGNOSIS — Z1239 Encounter for other screening for malignant neoplasm of breast: Secondary | ICD-10-CM

## 2018-09-11 NOTE — Therapy (Signed)
[place], [place], [address] Phone: [phone]   Fax:  [phone]  Physical Therapy Treatment  Patient Details  Name: Erin Lawson MRN: [id number] Date of Birth: [birth year] Referring Provider (PT): Burnett Harry   Encounter Date: 09/11/2018  PT End of Session - 09/11/18 0830    Visit Number  11    Date for PT Re-Evaluation  10/05/18    PT Start Time  7124    PT Stop Time  0850    PT Time Calculation (min)  56 min       Past Medical History:  Diagnosis Date  . DM type 2 (diabetes mellitus, type 2) ([place]) 05/18/2012  . Hypertension     Past Surgical History:  Procedure Laterality Date  . CESAREAN SECTION    . utreine polyp     resected    There were no vitals filed for this visit.  Subjective Assessment - 09/11/18 0757    Subjective  pain yesterday and that spot "pooched out"    Currently in Pain?  No/denies                       [facility] Adult PT Treatment/Exercise - 09/11/18 0001      Lumbar Exercises: Aerobic   Nustep  level 5 x 6 minutes      Lumbar Exercises: Machines for Strengthening   Cybex Lumbar Extension  black tband flex 2 sets 10    Cybex Knee Extension  10# 10x, 5# 10 x    Cybex Knee Flexion  25# 10x, 20# 10 x    Leg Press  20# 2 sets 10, 2nd set decreased ROM    Other Lumbar Machine Exercise   15# lats and rows 2x10    Other Lumbar Machine Exercise  10# cable press out of core 10 each side      Lumbar Exercises: Standing   Other Standing Lumbar Exercises  wt ball ext and obl 10 reps    Other Standing Lumbar Exercises  5# shld ext,row and chest press 10 each   ext difficult for pt     Modalities   Modalities  Electrical Stimulation;Moist Heat      Moist Heat Therapy   Number Minutes Moist Heat  15 Minutes    Moist Heat Location  Other (comment)   abs     Electrical Stimulation   Electrical Stimulation Location  right lower abdomen    Electrical Stimulation Action  --   IFC   Electrical Stimulation Parameters  supine    Electrical Stimulation Goals  Pain                  PT Long Term Goals - 09/07/18 0834      PT LONG TERM GOAL #2   Title  decrease pain 50% with getting up from bed    Status  Partially Met      PT LONG TERM GOAL #3   Title  tolerate lifting 40 #    Status  On-going            Plan - 09/11/18 0831    Clinical Impression Statement  pt tolerated session well today. slowly increased some wt back up assureing ot was tolerating fine. but did not c/o pain just muscle fatigue.       Patient will benefit from skilled therapeutic intervention in order to improve the following deficits and impairments:  Visit Diagnosis: Muscle weakness (generalized)     Problem List Patient Active Problem List   Diagnosis Date Noted  . Vitamin D deficiency 12/12/2017  . Primary osteoarthritis of left knee 12/12/2017  . Chest pain 05/18/2012  . Mild anemia 05/18/2012  . DM type 2 (diabetes mellitus, type 2) ([place]) 05/18/2012  . Essential hypertension 04/06/2007  . KELOID 04/06/2007    PAYSEUR,ANGIE  PTA 09/11/2018, 8:33 AM  [place] [address] [place], [place], [address] Phone: [phone]   Fax:  [phone]  Name: Erin Lawson MRN: [id number] Date of Birth: [birth year]

## 2018-09-11 NOTE — Telephone Encounter (Signed)
Order has been placed.

## 2018-09-14 ENCOUNTER — Ambulatory Visit: Payer: 59 | Admitting: Physical Therapy

## 2018-09-14 ENCOUNTER — Other Ambulatory Visit: Payer: Self-pay

## 2018-09-14 ENCOUNTER — Encounter: Payer: Self-pay | Admitting: Physical Therapy

## 2018-09-14 DIAGNOSIS — M6281 Muscle weakness (generalized): Secondary | ICD-10-CM | POA: Diagnosis not present

## 2018-09-14 NOTE — Therapy (Signed)
[place], [place], [address] Phone: [phone]   Fax:  [phone]  Physical Therapy Treatment  Patient Details  Name: Erin Lawson MRN: [id number] Date of Birth: 1960-02-01 Referring Provider (PT): Burnett Harry   Encounter Date: 09/14/2018  PT End of Session - 09/14/18 0841    Visit Number  12    Date for PT Re-Evaluation  10/05/18    PT Start Time  8546    PT Stop Time  0853    PT Time Calculation (min)  59 min    Activity Tolerance  Patient tolerated treatment well       Past Medical History:  Diagnosis Date  . DM type 2 (diabetes mellitus, type 2) ([place]) 05/18/2012  . Hypertension     Past Surgical History:  Procedure Laterality Date  . CESAREAN SECTION    . utreine polyp     resected    There were no vitals filed for this visit.  Subjective Assessment - 09/14/18 0758    Subjective  I did a lot of walking this weekend.  Feeling better    Currently in Pain?  Yes    Pain Score  2     Pain Location  Abdomen                       [facility] Adult PT Treatment/Exercise - 09/14/18 0001      Lumbar Exercises: Aerobic   Nustep  level 5 x 6 minutes    Other Aerobic Exercise  ligth jog x 30 feet x 4, resisted gait backward and side stepping      Lumbar Exercises: Machines for Strengthening   Cybex Lumbar Extension  black tband 2 sets 10    Cybex Knee Extension  10# 10x, 5# 10 x    Cybex Knee Flexion  20# 2x10    Other Lumbar Machine Exercise   20# lats and rows 2x10      Lumbar Exercises: Standing   Other Standing Lumbar Exercises  pot stirs with 5# working on core stability    Other Standing Lumbar Exercises  3# hip fleixon, abduction and extension 2x10      Lumbar Exercises: Supine   Other Supine Lumbar Exercises  rolling side to side and then back to stomach both ways      Modalities   Modalities  Electrical Stimulation;Moist Heat      Moist Heat  Therapy   Number Minutes Moist Heat  12 Minutes    Moist Heat Location  Other (comment)   abdomen     Electrical Stimulation   Electrical Stimulation Location  right lower abdomen    Electrical Stimulation Action  IFC    Electrical Stimulation Parameters  supine    Electrical Stimulation Goals  Pain                  PT Long Term Goals - 09/14/18 0844      PT LONG TERM GOAL #2   Title  decrease pain 50% with getting up from bed    Status  Partially Met      PT LONG TERM GOAL #3   Title  tolerate lifting 40 #    Status  On-going            Plan - 09/14/18 0841    Clinical Impression Statement  After last weeks flare up it seems like she is doing better again,  nothing today caused an increase of pain, the core and the legs are weak and 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 Vitamin D deficiency 12/12/2017  . Primary osteoarthritis of left knee 12/12/2017  . Chest pain 05/18/2012  . Mild anemia 05/18/2012  . DM type 2 (diabetes mellitus, type 2) ([place]) 05/18/2012  . Essential hypertension 04/06/2007  . KELOID 04/06/2007    Sumner Boast., PT 09/14/2018, 8:45 AM  [place] [address] [place], [place], [address] Phone: [phone]   Fax:  [phone]  Name: Erin Lawson MRN: [id number] Date of Birth:  1960/09/18

## 2018-09-16 ENCOUNTER — Other Ambulatory Visit: Payer: Self-pay | Admitting: Family Medicine

## 2018-09-16 DIAGNOSIS — N6320 Unspecified lump in the left breast, unspecified quadrant: Secondary | ICD-10-CM

## 2018-09-17 ENCOUNTER — Other Ambulatory Visit: Payer: Self-pay

## 2018-09-17 ENCOUNTER — Other Ambulatory Visit: Payer: Self-pay | Admitting: Family Medicine

## 2018-09-17 ENCOUNTER — Encounter: Payer: Self-pay | Admitting: Family Medicine

## 2018-09-17 ENCOUNTER — Ambulatory Visit: Payer: 59 | Attending: Family Medicine | Admitting: Physical Therapy

## 2018-09-17 DIAGNOSIS — M6281 Muscle weakness (generalized): Secondary | ICD-10-CM | POA: Insufficient documentation

## 2018-09-17 DIAGNOSIS — E1165 Type 2 diabetes mellitus with hyperglycemia: Secondary | ICD-10-CM

## 2018-09-17 MED ORDER — SITAGLIPTIN PHOS-METFORMIN HCL 50-1000 MG PO TABS: 1.0000 | ORAL_TABLET | Freq: Two times a day (BID) | ORAL | 3 refills | Status: DC

## 2018-09-17 NOTE — Therapy (Signed)
[place], [place], [address] Phone: [phone]   Fax:  [phone]  Physical Therapy Treatment  Patient Details  Name: Erin Lawson MRN: [id number] Date of Birth: [birth year] Referring Provider (PT): Burnett Harry   Encounter Date: 09/17/2018  PT End of Session - 09/17/18 0830    Visit Number  13    Date for PT Re-Evaluation  10/05/18    PT Start Time  3817    PT Stop Time  0848    PT Time Calculation (min)  53 min       Past Medical History:  Diagnosis Date  . DM type 2 (diabetes mellitus, type 2) ([place]) 05/18/2012  . Hypertension     Past Surgical History:  Procedure Laterality Date  . CESAREAN SECTION    . utreine polyp     resected    There were no vitals filed for this visit.  Subjective Assessment - 09/17/18 0756    Subjective  removed me from work until I have a note. no pain except while sitting-comes and goes    Currently in Pain?  No/denies                       [facility] Adult PT Treatment/Exercise - 09/17/18 0001      Lumbar Exercises: Aerobic   Nustep  L 6 67mn      Lumbar Exercises: Machines for Strengthening   Cybex Lumbar Extension  black tband 2 sets 10   flex and ext   Cybex Knee Extension  10# 2 sets 10    Cybex Knee Flexion  20# 2x10    Other Lumbar Machine Exercise   20# lats and rows 2x10      Lumbar Exercises: Standing   Other Standing Lumbar Exercises  walking with sports cord 30# fwd/side stepping   visible difficulty controlling mvmnt back     Lumbar Exercises: Seated   Sit to Stand  20 reps   chest press with ball and 10 with OH press     Lumbar Exercises: Supine   Straight Leg Raise  20 reps   with abd and red tband   Other Supine Lumbar Exercises  wt ball abdominals 3 sets 10      Modalities   Modalities  Electrical Stimulation;Moist Heat      Moist Heat Therapy   Number Minutes Moist Heat  15 Minutes    Moist Heat  Location  --   abdomen     Electrical Stimulation   Electrical Stimulation Location  right lower abdomen    Electrical Stimulation Action  IFC    Electrical Stimulation Parameters  supine    Electrical Stimulation Goals  Pain                  PT Long Term Goals - 09/17/18 07116     PT LONG TERM GOAL #1   Title  indepednent with HEP    Status  Achieved      PT LONG TERM GOAL #2   Title  decrease pain 50% with getting up from bed    Status  Achieved      PT LONG TERM GOAL #3   Title  tolerate lifting 40 #    Status  On-going      PT LONG TERM GOAL #4   Title  increase ROM to WNL's    Status  Achieved  PT LONG TERM GOAL #5   Title  understand posture and body mechnaics    Status  Partially Met            Plan - 09/17/18 0831    Clinical Impression Statement  added core ex with wt ball in supine as well as SLR with resistance to target core. STS with wt ball. pt tolerated 2 sets of knee ext with heavier wt. pt making gains with slow progression.    PT Treatment/Interventions  ADLs/Self Care Home Management;Electrical Stimulation;Cryotherapy;Moist Heat;Ultrasound;Therapeutic activities;Therapeutic exercise;Manual techniques;Patient/family education    PT Next Visit Plan  be very careful, no planks, slowly rebuild and assess tolerance       Patient will benefit from skilled therapeutic intervention in order to improve the following deficits and impairments:  Abnormal gait, Improper body mechanics, Pain, Postural dysfunction, Increased muscle spasms, Decreased range of motion, Decreased strength, Impaired flexibility  Visit Diagnosis: Muscle weakness (generalized)     Problem List Patient Active Problem List   Diagnosis Date Noted  . Vitamin D deficiency 12/12/2017  . Primary osteoarthritis of left knee 12/12/2017  . Chest pain 05/18/2012  . Mild anemia 05/18/2012  . DM type 2 (diabetes mellitus, type 2) ([place]) 05/18/2012  . Essential hypertension  04/06/2007  . KELOID 04/06/2007    ,ANGIE PTA 09/17/2018, 8:34 AM  [address] [place], [place], [address] Phone: [phone]   Fax:  [phone]  Name: Erin Lawson MRN: [id number] Date of Birth: [birth year]

## 2018-09-17 NOTE — Telephone Encounter (Signed)
Requested medication (s) are due for refill today: yes  Requested medication (s) are on the active medication list: yes  Last refill: 09/17/2018  Future visit scheduled: no  Notes to clinic:  Alternative Requested:NOT COVERED BY INSURANCE   Requested Prescriptions  Pending Prescriptions Disp Refills   KAZANO 12.5-500 MG TABS [Pharmacy Med Name: KAZANO 12.5-500 MG TABLET]  0    Sig: Please specify directions, refills and quantity     There is no refill protocol information for this order

## 2018-09-22 ENCOUNTER — Encounter: Payer: Self-pay | Admitting: Radiology

## 2018-09-22 ENCOUNTER — Ambulatory Visit: Payer: 59 | Admitting: Physical Therapy

## 2018-09-22 ENCOUNTER — Other Ambulatory Visit: Payer: Self-pay

## 2018-09-22 DIAGNOSIS — M6281 Muscle weakness (generalized): Secondary | ICD-10-CM

## 2018-09-22 NOTE — Therapy (Signed)
[place], [place], [address] Phone: [phone]   Fax:  [phone]  Physical Therapy Treatment  Patient Details  Name: Erin Lawson MRN: [id number] Date of Birth: 1960/09/12 Referring Provider (PT): Burnett Harry   Encounter Date: 09/22/2018  PT End of Session - 09/22/18 0832    Visit Number  14    Date for PT Re-Evaluation  10/05/18    PT Start Time  7517    PT Stop Time  0835    PT Time Calculation (min)  38 min       Past Medical History:  Diagnosis Date  . DM type 2 (diabetes mellitus, type 2) ([place]) 05/18/2012  . Hypertension     Past Surgical History:  Procedure Laterality Date  . CESAREAN SECTION    . utreine polyp     resected    There were no vitals filed for this visit.  Subjective Assessment - 09/22/18 0758    Subjective  doing okay    Currently in Pain?  No/denies                       [facility] Adult PT Treatment/Exercise - 09/22/18 0001      Lumbar Exercises: Aerobic   Nustep  L 7 6 min      Lumbar Exercises: Machines for Strengthening   Cybex Lumbar Extension  black tband 20 reps flex/ext    Cybex Knee Extension  10# 2 sets 10    Cybex Knee Flexion  20# 2x12    Leg Press  20# 3 sets 10   isometric abdominal from this position 20 reps   Other Lumbar Machine Exercise   25# lats and rows 2x10      Lumbar Exercises: Standing   Other Standing Lumbar Exercises  pot stirs with 5# working on core stability    Other Standing Lumbar Exercises  4# flex,abd, ER and chest press 10 reps with cues for core activation   very fatiging for pt and cuing needed to keep core active     Lumbar Exercises: Seated   Sit to Stand  20 reps   10x wt ball chest press, 10X wt ball OH     Lumbar Exercises: Quadruped   Opposite Arm/Leg Raise  Right arm/Left leg;Left arm/Right leg;10 reps;3 seconds                  PT Long Term Goals - 09/17/18 0017      PT LONG TERM GOAL #1   Title  indepednent with HEP    Status  Achieved      PT LONG TERM GOAL #2   Title  decrease pain 50% with getting up from bed    Status  Achieved      PT LONG TERM GOAL #3   Title  tolerate lifting 40 #    Status  On-going      PT LONG TERM GOAL #4   Title  increase ROM to WNL's    Status  Achieved      PT LONG TERM GOAL #5   Title  understand posture and body mechnaics    Status  Partially Met            Plan - 09/22/18 4944    Clinical Impression Statement  added wt and reps to most ex and pt tolerated well. pt did fatigue with 4# UE ex at end of session.  cuing with ex to engage core. at end of session pt verb " just enough to let me know its there" when asked about pain. trial without modalities    PT Treatment/Interventions  ADLs/Self Care Home Management;Electrical Stimulation;Cryotherapy;Moist Heat;Ultrasound;Therapeutic activities;Therapeutic exercise;Manual techniques;Patient/family education    PT Next Visit Plan  asses how pt felt with increased wt and reps an dno modalities       Patient will benefit from skilled therapeutic intervention in order to improve the following deficits and impairments:  Abnormal gait, Improper body mechanics, Pain, Postural dysfunction, Increased muscle spasms, Decreased range of motion, Decreased strength, Impaired flexibility  Visit Diagnosis: Muscle weakness (generalized)     Problem List Patient Active Problem List   Diagnosis Date Noted  . Vitamin D deficiency 12/12/2017  . Primary osteoarthritis of left knee 12/12/2017  . Chest pain 05/18/2012  . Mild anemia 05/18/2012  . DM type 2 (diabetes mellitus, type 2) ([place]) 05/18/2012  . Essential hypertension 04/06/2007  . KELOID 04/06/2007    ,ANGIE PTA 09/22/2018, 8:35 AM  [address] [place], [place], [address] Phone: [phone]   Fax:  [phone]  Name:  Erin Lawson MRN: [id number] Date of Birth: [birth year]

## 2018-09-24 ENCOUNTER — Other Ambulatory Visit: Payer: Self-pay

## 2018-09-24 ENCOUNTER — Ambulatory Visit: Payer: 59 | Admitting: Physical Therapy

## 2018-09-24 DIAGNOSIS — M6281 Muscle weakness (generalized): Secondary | ICD-10-CM

## 2018-09-24 NOTE — Therapy (Signed)
[place], [place], [address] Phone: [phone]   Fax:  [phone]  Physical Therapy Treatment  Patient Details  Name: Erin Lawson MRN: [id number] Date of Birth: 21-Apr-1960 Referring Provider (PT): Burnett Harry   Encounter Date: 09/24/2018  PT End of Session - 09/24/18 0835    Visit Number  15    Date for PT Re-Evaluation  10/05/18    PT Start Time  8676    PT Stop Time  0835    PT Time Calculation (min)  40 min       Past Medical History:  Diagnosis Date  . DM type 2 (diabetes mellitus, type 2) ([place]) 05/18/2012  . Hypertension     Past Surgical History:  Procedure Laterality Date  . CESAREAN SECTION    . utreine polyp     resected    There were no vitals filed for this visit.  Subjective Assessment - 09/24/18 0757    Subjective  every now and then it will let me know its there and then it goes away. no issues after last session    Currently in Pain?  No/denies                       [facility] Adult PT Treatment/Exercise - 09/24/18 0001      Self-Care   Self-Care  Lifting    Lifting  25# 5 times , then 25# carry 40 feet      Lumbar Exercises: Aerobic   Nustep  L 7 6 min      Lumbar Exercises: Machines for Strengthening   Cybex Lumbar Extension  black tband 25 reps flex/ext    Cybex Knee Extension  10# 2 sets 10    Cybex Knee Flexion  20# 2x12    Leg Press  20# 3 sets 10   isometric abdominals 20 reps hold 3 sec   Other Lumbar Machine Exercise   25# lats and rows 2x10    Other Lumbar Machine Exercise  5# dead lift 2 sets 10      Lumbar Exercises: Standing   Other Standing Lumbar Exercises  pot stirs with 10# working on core stability   wall sit 5 times 5 sec hold- difficult to get back  up   Other Standing Lumbar Exercises  4# flex,abd, ER and chest press 10 reps with cues for core activation      Lumbar Exercises: Seated   Sit to Stand  20 reps    blue ball 10 x chest press, 10x OH press                 PT Long Term Goals - 09/24/18 0802      PT LONG TERM GOAL #3   Title  tolerate lifting 40 #    Baseline  25# lift and carry, fatigue but denies pain    Status  Partially Met      PT LONG TERM GOAL #5   Title  understand posture and body mechnaics    Status  Achieved            Plan - 09/24/18 0836    Clinical Impression Statement  overall doing well. pt ddi fatigue with 25# lift but able to complete and demo good body mech and posture- progressing towards this remaining goal. pt stuggled with 5 sec wall sit coming back to standing. slowly progressing wt and reps.  PT Treatment/Interventions  ADLs/Self Care Home Management;Electrical Stimulation;Cryotherapy;Moist Heat;Ultrasound;Therapeutic activities;Therapeutic exercise;Manual techniques;Patient/family education    PT Next Visit Plan  assess how pt felt. increase lifting       Patient will benefit from skilled therapeutic intervention in order to improve the following deficits and impairments:  Abnormal gait, Improper body mechanics, Pain, Postural dysfunction, Increased muscle spasms, Decreased range of motion, Decreased strength, Impaired flexibility  Visit Diagnosis: Muscle weakness (generalized)     Problem List Patient Active Problem List   Diagnosis Date Noted  . Vitamin D deficiency 12/12/2017  . Primary osteoarthritis of left knee 12/12/2017  . Chest pain 05/18/2012  . Mild anemia 05/18/2012  . DM type 2 (diabetes mellitus, type 2) ([place]) 05/18/2012  . Essential hypertension 04/06/2007  . KELOID 04/06/2007    ,ANGIE PTA 09/24/2018, 8:46 AM  [address] [place], [place], [address] Phone: [phone]   Fax:  [phone]  Name: Erin Lawson MRN: [id number] Date of Birth: 1960-05-24

## 2018-09-28 ENCOUNTER — Ambulatory Visit: Payer: 59 | Admitting: Physical Therapy

## 2018-09-28 ENCOUNTER — Other Ambulatory Visit: Payer: Self-pay

## 2018-09-28 ENCOUNTER — Encounter: Payer: Self-pay | Admitting: Physical Therapy

## 2018-09-28 DIAGNOSIS — M6281 Muscle weakness (generalized): Secondary | ICD-10-CM

## 2018-09-28 NOTE — Therapy (Signed)
[place], [place], [address] Phone: [phone]   Fax:  [phone]  Physical Therapy Treatment  Patient Details  Name: Erin Lawson MRN: [id number] Date of Birth: 08/30/1960 Referring Provider (PT): Burnett Harry   Encounter Date: 09/28/2018  PT End of Session - 09/28/18 0837    Visit Number  16    Date for PT Re-Evaluation  10/05/18    PT Start Time  5852    PT Stop Time  0838    PT Time Calculation (min)  43 min    Activity Tolerance  Patient tolerated treatment well    Behavior During Therapy  [facility] for tasks assessed/performed;Anxious       Past Medical History:  Diagnosis Date  . DM type 2 (diabetes mellitus, type 2) ([place]) 05/18/2012  . Hypertension     Past Surgical History:  Procedure Laterality Date  . CESAREAN SECTION    . utreine polyp     resected    There were no vitals filed for this visit.  Subjective Assessment - 09/28/18 0803    Subjective  It still pops up every now and then, I do not know why, but now it goes a way quickly    Currently in Pain?  Yes    Pain Score  2     Pain Location  Abdomen    Aggravating Factors   strenuous activities                       [facility] Adult PT Treatment/Exercise - 09/28/18 0001      Lumbar Exercises: Aerobic   Nustep  L 7 6 min    Other Aerobic Exercise  walk around the building outside with 3# biceps and overhead press alternating, then a light jog 30' x 3      Lumbar Exercises: Machines for Strengthening   Cybex Knee Extension  10# 2 sets 10    Cybex Knee Flexion  20# 2x12    Other Lumbar Machine Exercise   25# lats and rows 2x10    Other Lumbar Machine Exercise  9# dead lift 2 sets 10, 3# squat to 17" and up 2x5, this was difficult for her.      Lumbar Exercises: Standing   Other Standing Lumbar Exercises  pot stirs with 10# working on core stability, 5# straight arm pull downs, 2.5# hip abduction and  extension    Other Standing Lumbar Exercises  small weighted ball carry at arms length for core, 10# unilateral farmer carry                  PT Long Term Goals - 09/28/18 0839      PT LONG TERM GOAL #2   Title  decrease pain 50% with getting up from bed    Status  Achieved      PT LONG TERM GOAL #3   Baseline  25# lift and carry, fatigue but denies pain    Status  Partially Met            Plan - 09/28/18 0838    Clinical Impression Statement  Patient became fairly fatigued, she also had some mm shaking with the deadlift, squat and hip exercises, some cues to keep good form, she did touch her abdomen a fe3w times during the session but when I asked if it hurt she said no    PT Next Visit Plan  continue  to work with her on a return to work    Oncologist with Plan of Care  Patient       Patient will benefit from skilled therapeutic intervention in order to improve the following deficits and impairments:  Abnormal gait, Improper body mechanics, Pain, Postural dysfunction, Increased muscle spasms, Decreased range of motion, Decreased strength, Impaired flexibility  Visit Diagnosis: Muscle weakness (generalized)     Problem List Patient Active Problem List   Diagnosis Date Noted  . Vitamin D deficiency 12/12/2017  . Primary osteoarthritis of left knee 12/12/2017  . Chest pain 05/18/2012  . Mild anemia 05/18/2012  . DM type 2 (diabetes mellitus, type 2) ([place]) 05/18/2012  . Essential hypertension 04/06/2007  . KELOID 04/06/2007    Sumner Boast., PT 09/28/2018, 8:40 AM  [address] [place], [place], [address] Phone: [phone]   Fax:  [phone]  Name: Erin Lawson MRN: [id number] Date of Birth: [birth year]

## 2018-10-01 ENCOUNTER — Ambulatory Visit: Payer: 59 | Admitting: Physical Therapy

## 2018-10-01 ENCOUNTER — Other Ambulatory Visit: Payer: 59

## 2018-10-01 ENCOUNTER — Other Ambulatory Visit: Payer: Self-pay

## 2018-10-01 DIAGNOSIS — M6281 Muscle weakness (generalized): Secondary | ICD-10-CM

## 2018-10-01 NOTE — Therapy (Signed)
[place], [place], [address] Phone: [phone]   Fax:  [phone]  Physical Therapy Treatment  Patient Details  Name: Erin Lawson MRN: [id number] Date of Birth: Apr 04, 1960 Referring Provider (PT): Burnett Harry   Encounter Date: 10/01/2018  PT End of Session - 10/01/18 0834    Visit Number  17    Date for PT Re-Evaluation  10/05/18    PT Start Time  0759    PT Stop Time  0838    PT Time Calculation (min)  39 min       Past Medical History:  Diagnosis Date  . DM type 2 (diabetes mellitus, type 2) ([place]) 05/18/2012  . Hypertension     Past Surgical History:  Procedure Laterality Date  . CESAREAN SECTION    . utreine polyp     resected    There were no vitals filed for this visit.  Subjective Assessment - 10/01/18 0800    Subjective  fine. alittle pain after last session but went away quickly    Currently in Pain?  No/denies                       [facility] Adult PT Treatment/Exercise - 10/01/18 0001      Lumbar Exercises: Aerobic   Elliptical  I 5 R 4 5 min   pt c/o leg fatigue quickly     Lumbar Exercises: Machines for Strengthening   Cybex Lumbar Extension  trunk flex/ext 25x black tband    Cybex Knee Extension  10# 2 sets 10    Cybex Knee Flexion  25# 2 sets 10    Leg Press  30# 2 sets 12    Other Lumbar Machine Exercise  9# dead lift 2 sets 10, 3# squat to 17" and up 3x5,      Lumbar Exercises: Standing   Other Standing Lumbar Exercises  hip pulleys 4 way 5# 10 reps each   cued to control speed and mvmt   Other Standing Lumbar Exercises  blue wt ball OH press with 40 foot carry   then rep with chest press     Lumbar Exercises: Seated   Other Seated Lumbar Exercises  straight arm pull down 10# 2 sets 10   rubbed stomach btwn sets but when asked denies pain,   Other Seated Lumbar Exercises  wall slide 2 sets 5 5 sec hold      Lumbar Exercises: Supine    Other Supine Lumbar Exercises  3# partial sit up with straight arm      Lumbar Exercises: Quadruped   Opposite Arm/Leg Raise  Right arm/Left leg;Left arm/Right leg;10 reps;3 seconds   3# UE ex                 PT Long Term Goals - 09/28/18 0839      PT LONG TERM GOAL #2   Title  decrease pain 50% with getting up from bed    Status  Achieved      PT LONG TERM GOAL #3   Baseline  25# lift and carry, fatigue but denies pain    Status  Partially Met            Plan - 10/01/18 0835    Clinical Impression Statement  fatigues with ex but overall doing much better and tolerating increased wt and reps. pt does need cuing to control mvmt d/t fatigue/weakness  PT Treatment/Interventions  ADLs/Self Care Home Management;Electrical Stimulation;Cryotherapy;Moist Heat;Ultrasound;Therapeutic activities;Therapeutic exercise;Manual techniques;Patient/family education    PT Next Visit Plan  continue to work with her on a return to work       Patient will benefit from skilled therapeutic intervention in order to improve the following deficits and impairments:  Abnormal gait, Improper body mechanics, Pain, Postural dysfunction, Increased muscle spasms, Decreased range of motion, Decreased strength, Impaired flexibility  Visit Diagnosis: Muscle weakness (generalized)     Problem List Patient Active Problem List   Diagnosis Date Noted  . Vitamin D deficiency 12/12/2017  . Primary osteoarthritis of left knee 12/12/2017  . Chest pain 05/18/2012  . Mild anemia 05/18/2012  . DM type 2 (diabetes mellitus, type 2) ([place]) 05/18/2012  . Essential hypertension 04/06/2007  . KELOID 04/06/2007    Urania Pearlman,ANGIE  PTA 10/01/2018, 8:36 AM  [address] [place], [place], [address] Phone: [phone]   Fax:  [phone]  Name: Erin Lawson MRN: [id number] Date of Birth: Sep 02, 1960

## 2018-10-02 ENCOUNTER — Telehealth: Payer: Self-pay | Admitting: Family Medicine

## 2018-10-02 NOTE — Telephone Encounter (Signed)
Spoke with pt and she informed me that the PT told her she would have to get a note from her PCP to go back to work. I explained  to her that since there where evaluating her they should be the one giving her a go back to work note. Please advise

## 2018-10-02 NOTE — Telephone Encounter (Signed)
Copied from [place] [id number]. Topic: General - Other >> Oct 01, 2018  3:36 PM Sheran Luz wrote: Patient requesting call back to discuss return to work letter. She plans to return to work the week of 09/28.

## 2018-10-06 ENCOUNTER — Other Ambulatory Visit: Payer: Self-pay

## 2018-10-06 ENCOUNTER — Ambulatory Visit: Payer: 59 | Admitting: Physical Therapy

## 2018-10-06 DIAGNOSIS — M6281 Muscle weakness (generalized): Secondary | ICD-10-CM

## 2018-10-06 NOTE — Therapy (Signed)
[place], [place], [address] Phone: [phone]   Fax:  [phone]  Physical Therapy Treatment  Patient Details  Name: Erin Lawson MRN: [id number] Date of Birth: Apr 24, 1960 Referring Provider (PT): Burnett Harry   Encounter Date: 10/06/2018  PT End of Session - 10/06/18 0834    Visit Number  18    PT Start Time  0109    PT Stop Time  0837    PT Time Calculation (min)  42 min       Past Medical History:  Diagnosis Date  . DM type 2 (diabetes mellitus, type 2) ([place]) 05/18/2012  . Hypertension     Past Surgical History:  Procedure Laterality Date  . CESAREAN SECTION    . utreine polyp     resected    There were no vitals filed for this visit.  Subjective Assessment - 10/06/18 0757    Subjective  overall good, pain comes and goes    Currently in Pain?  No/denies                       [facility] Adult PT Treatment/Exercise - 10/06/18 0001      Ambulation/Gait   Stairs  Yes    Stair Management Technique  No rails;Alternating pattern    Number of Stairs  30      Lumbar Exercises: Aerobic   Elliptical  I 8 R 4 5 min      Lumbar Exercises: Machines for Strengthening   Cybex Lumbar Extension  trunk flex/ext 25x black tband    Cybex Knee Extension  10# 2 sets 10    Cybex Knee Flexion  25# 2 sets 10    Leg Press  30# 2 sets 15    Other Lumbar Machine Exercise   25# lats and rows 2x10      Lumbar Exercises: Standing   Other Standing Lumbar Exercises  30# box lift floor to mat 3 set 5   30# box carry 40 feet 2 times   Other Standing Lumbar Exercises  wt ball OH and obl at wall 15 each      Lumbar Exercises: Seated   Sit to Stand  20 reps   wt ball 10 chess press, 10 OH     Lumbar Exercises: Supine   Other Supine Lumbar Exercises  core stab 10 min      Lumbar Exercises: Quadruped   Opposite Arm/Leg Raise  Right arm/Left leg;Left arm/Right leg;3 seconds;20 reps    3# UE                 PT Long Term Goals - 10/06/18 0836      PT LONG TERM GOAL #3   Title  tolerate lifting 40 #    Baseline  30#    Status  Partially Met            Plan - 10/06/18 0834    Clinical Impression Statement  tolerated all interventions without pain, minimal fatigue. all goals met except lifting 40#, pt is lifting 30# painfree    PT Treatment/Interventions  ADLs/Self Care Home Management;Electrical Stimulation;Cryotherapy;Moist Heat;Ultrasound;Therapeutic activities;Therapeutic exercise;Manual techniques;Patient/family education    PT Next Visit Plan  D/C next visit       Patient will benefit from skilled therapeutic intervention in order to improve the following deficits and impairments:  Abnormal gait, Improper body mechanics, Pain, Postural dysfunction, Increased muscle spasms, Decreased range of  motion, Decreased strength, Impaired flexibility  Visit Diagnosis: Muscle weakness (generalized)     Problem List Patient Active Problem List   Diagnosis Date Noted  . Vitamin D deficiency 12/12/2017  . Primary osteoarthritis of left knee 12/12/2017  . Chest pain 05/18/2012  . Mild anemia 05/18/2012  . DM type 2 (diabetes mellitus, type 2) ([place]) 05/18/2012  . Essential hypertension 04/06/2007  . KELOID 04/06/2007    Aariv Medlock,ANGIE PTA 10/06/2018, 8:38 AM  [address] [place], [place], [address] Phone: [phone]   Fax:  [phone]  Name: OLUWAKEMI SALSBERRY MRN: [id number] Date of Birth: Oct 12, 1960

## 2018-10-08 ENCOUNTER — Ambulatory Visit: Payer: 59 | Admitting: Physical Therapy

## 2018-10-08 ENCOUNTER — Other Ambulatory Visit: Payer: Self-pay

## 2018-10-08 DIAGNOSIS — M6281 Muscle weakness (generalized): Secondary | ICD-10-CM

## 2018-10-08 NOTE — Telephone Encounter (Signed)
Please make her an office visit as a double book either Monday or Tuesday.  I have to do an exam and document her ability to return to work as well as check on her diabetes.  I can see her Monday 9/28 at 11:20 or 9/29 at 8am

## 2018-10-08 NOTE — Telephone Encounter (Signed)
Spoke with pt and scheduled appt for 9/28 at 11:20 AM

## 2018-10-08 NOTE — Therapy (Signed)
[facility] [address] [place], [place], [address] Phone: [phone]   Fax:  [phone]  Physical Therapy Treatment  Patient Details  Name: Erin Lawson MRN: [id number] Date of Birth: 03/16/1960 Referring Provider (PT): Z. Stallings   Encounter Date: 10/08/2018  PT End of Session - 10/08/18 0824    Visit Number  19    Date for PT Re-Evaluation  10/05/18    PT Start Time  0757    PT Stop Time  0836    PT Time Calculation (min)  39 min       Past Medical History:  Diagnosis Date  . DM type 2 (diabetes mellitus, type 2) (HCC) 05/18/2012  . Hypertension     Past Surgical History:  Procedure Laterality Date  . CESAREAN SECTION    . utreine polyp     resected    There were no vitals filed for this visit.  Subjective Assessment - 10/08/18 0802    Subjective  doing well did alot of walking    Currently in Pain?  No/denies                       [facility] Adult PT Treatment/Exercise - 10/08/18 0001      Lumbar Exercises: Aerobic   Elliptical  I 8 R 5 3 fwd/3 back      Lumbar Exercises: Machines for Strengthening   Cybex Lumbar Extension  trunk flex/ext 25x black tband    Cybex Knee Extension  10# 2 sets 15    Cybex Knee Flexion  25# 2 sets 15    Leg Press  30# 2 sets 15    Other Lumbar Machine Exercise   25# lats and rows 2x10      Lumbar Exercises: Standing   Other Standing Lumbar Exercises  40# box lift floor to mat 2 sets 10   40# 30 foot carry   Other Standing Lumbar Exercises  wt ball OH and obl at wall 15 each   wall sits 10 reps hold 5 sec     Lumbar Exercises: Supine   Ab Set  20 reps;3 seconds   with ball                 PT Long Term Goals - 10/08/18 0822      PT LONG TERM GOAL #3   Title  tolerate lifting 40 #    Baseline  40# today, able ot do but difficult    Status  Achieved            Plan - 10/08/18 0824    Clinical Impression Statement   all goals met. D/C    PT Treatment/Interventions  ADLs/Self Care Home Management;Electrical Stimulation;Cryotherapy;Moist Heat;Ultrasound;Therapeutic activities;Therapeutic exercise;Manual techniques;Patient/family education    PT Next Visit Plan  D/C       Patient will benefit from skilled therapeutic intervention in order to improve the following deficits and impairments:  Abnormal gait, Improper body mechanics, Pain, Postural dysfunction, Increased muscle spasms, Decreased range of motion, Decreased strength, Impaired flexibility  Visit Diagnosis: Muscle weakness (generalized)     Problem List Patient Active Problem List   Diagnosis Date Noted  . Vitamin D deficiency 12/12/2017  . Primary osteoarthritis of left knee 12/12/2017  . Chest pain 05/18/2012  . Mild anemia 05/18/2012  . DM type 2 (diabetes mellitus, type 2) (HCC) 05/18/2012  . Essential hypertension 04/06/2007  . KELOID 04/06/2007       Plan: Patient agrees to discharge.  Patient goals were met. Patient is being discharged due to meeting the stated rehab goals.  ?????      PAYSEUR,ANGIE  PTA 10/08/2018, 8:31 AM  [facility] [address] [place], [place], [address] Phone: [phone]   Fax:  [phone]  Name: Pristine M Cuccaro MRN: [id number] Date of Birth: 10/24/1960   

## 2018-10-12 ENCOUNTER — Encounter: Payer: Self-pay | Admitting: Family Medicine

## 2018-10-12 ENCOUNTER — Ambulatory Visit: Payer: 59 | Admitting: Family Medicine

## 2018-10-12 ENCOUNTER — Other Ambulatory Visit: Payer: Self-pay

## 2018-10-12 VITALS — BP 128/72 | HR 66 | Temp 98.5°F | Resp 17 | Ht 61.0 in | Wt 148.2 lb

## 2018-10-12 DIAGNOSIS — I10 Essential (primary) hypertension: Secondary | ICD-10-CM | POA: Diagnosis not present

## 2018-10-12 DIAGNOSIS — E1165 Type 2 diabetes mellitus with hyperglycemia: Secondary | ICD-10-CM

## 2018-10-12 DIAGNOSIS — Z23 Encounter for immunization: Secondary | ICD-10-CM

## 2018-10-12 DIAGNOSIS — S39011D Strain of muscle, fascia and tendon of abdomen, subsequent encounter: Secondary | ICD-10-CM | POA: Diagnosis not present

## 2018-10-12 LAB — POCT GLYCOSYLATED HEMOGLOBIN (HGB A1C): Hemoglobin A1C: 8.9 % — AB (ref 4.0–5.6)

## 2018-10-12 MED ORDER — METFORMIN HCL 500 MG PO TABS: ORAL_TABLET | ORAL | 1 refills | Status: DC

## 2018-10-12 MED ORDER — AMLODIPINE BESYLATE 5 MG PO TABS: 5.0000 mg | ORAL_TABLET | Freq: Every day | ORAL | 1 refills | Status: DC

## 2018-10-12 NOTE — Progress Notes (Signed)
Established Patient Office Visit  Subjective:  Patient ID: Erin Lawson, female    DOB: 28-Oct-1960  Age: 58 y.o. MRN: [id number]  CC:  Chief Complaint  Patient presents with  . return to work evaluation    pt would like to discuss [place]. Pt has upcoming appt on the 9th of October and wants to know if she needs to keep this appt also.  . Medication Refill    amlodipine    HPI Erin Lawson presents for   Diabetes Pt reports that she works nights  She has avoids red meat She recently res  She goes to sleep 8am  She eats before she goes to work at [employer] She eat watermelon or grapes on her break She walks every 15 minutes to check on people in the [place] She gets off work at [employer] She eats a bowl of watermelon if she is too tired to [employer]  She started the metformin for about a month as well as [employer]OSS She denies any hypoglycemia She did not take the [employer] Readings from Last 3 Encounters:  10/12/18 148 lb 3.2 oz (67.2 kg)  07/21/18 146 lb (66.2 kg)  12/08/17 149 lb 3.2 oz (67.7 kg)    Rectus abdominis strain She reports that she finished therapy on Thursday last week and feels like she is back to baseline Now she can get up without pain and has improve strength. She denies abdominal pain   She reports that at age 25 she had an abortion in the left lower quadrant and it seems like the incision area is itching and burning   Past Medical History:  Diagnosis Date  . DM type 2 (diabetes mellitus, type 2) ([place]) 05/18/2012  . Hypertension     Past Surgical History:  Procedure Laterality Date  . CESAREAN SECTION    . utreine polyp     resected    Family History  Problem Relation Age of Onset  . Hypertension Sister   . Heart attack Neg Hx     Social History   Socioeconomic History  . Marital status: Single    Spouse name: Not on file  . Number of children: Not on file  . Years of education: Not on file  . Highest education level: Not on file   Occupational History  . Occupation: cna    Comment: [employer]  Social Needs  . Financial resource strain: Not on file  . Food insecurity    Worry: Not on file    Inability: Not on file  . Transportation needs    Medical: Not on file    Non-medical: Not on file  Tobacco Use  . Smoking status: Never Smoker  . Smokeless tobacco: Never Used  Substance and Sexual Activity  . Alcohol use: No  . Drug use: No  . Sexual activity: Not on file  Lifestyle  . Physical activity    Days per week: Not on file    Minutes per session: Not on file  . Stress: Not on file  Relationships  . Social Herbalist on phone: Not on file    Gets together: Not on file    Attends religious service: Not on file    Active member of club or organization: Not on file    Attends meetings of clubs or organizations: Not on file    Relationship status: Not on file  . Intimate partner violence  Fear of current or ex partner: Not on file    Emotionally abused: Not on file    Physically abused: Not on file    Forced sexual activity: Not on file  Other Topics Concern  . 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 blood glucose meter kit and supplies KIT Dispense based on patient and insurance preference. Use up to four times daily as directed. (FOR ICD-9 250.00, 250.01). 1 each 0  . blood glucose meter kit and supplies Dispense based on patient and insurance preference. Use up to four times daily as directed. (FOR ICD-10 E10.9, E11.9). 1 each 0  . celecoxib (CELEBREX) 200 MG capsule TAKE 1 CAPSULE (200 MG TOTAL) BY MOUTH  DAILY AS NEEDED. 30 capsule 0  . glucose blood (CONTOUR NEXT TEST) test strip Check sugar once daily  Dx:  DMII controlled non-insulin dependent. 100 each 3  . KAZANO 12.5-500 MG TABS Please specify directions, refills and quantity 60 tablet 1  . LANCETS ULTRA FINE MISC Check sugar up one time daily dx: new onset DMII uncontrolled non-insulin 100 each 3  . methocarbamol (ROBAXIN) 500 MG tablet Take 1 tablet (500 mg total) by mouth every 8 (eight) hours as needed for muscle spasms. 15 tablet 0  . triamcinolone cream (KENALOG) 0.1 % Apply 1 application topically 2 (two) times daily. 30 g 0  . amLODipine (NORVASC) 5 MG tablet Take 1 tablet (5 mg total) by mouth daily. 90 tablet 3  . metFORMIN (GLUCOPHAGE) 500 MG tablet Take 1 tablet (500 mg total) by mouth daily with breakfast. 180 tablet 0   No facility-administered medications prior to visit.     Allergies  Allergen Reactions  . Atenolol Other (See Comments)    REACTION: made me feel bad REACTION: made me feel bad  . Poractant Alfa Other (See Comments)    Migraine   . Pork-Derived Products Other (See Comments)    Migraine   . Meloxicam Rash    ROS Review of Systems Review of Systems  Constitutional: Negative for activity change, appetite change, chills and fever.  HENT: Negative for congestion, nosebleeds, trouble swallowing and voice change.   Respiratory: Negative for cough, shortness of breath and wheezing.   Gastrointestinal: Negative for diarrhea, nausea and vomiting.  Genitourinary: Negative for difficulty urinating, dysuria, flank pain and hematuria.  Musculoskeletal: Negative for back pain, joint swelling and neck pain.  Neurological: Negative for dizziness, speech difficulty, light-headedness and numbness.  See HPI. All other review of systems negative.     Objective:    Physical Exam  BP 128/72   Pulse 66   Temp 98.5 F (36.9 C) (Oral)   Resp 17   Ht '5\' 1"'  (1.549 m)   Wt 148 lb 3.2 oz (67.2 kg)   SpO2 98%    BMI 28.00 kg/m  Wt Readings from Last 3 Encounters:  10/12/18 148 lb 3.2 oz (67.2 kg)  07/21/18 146 lb (66.2 kg)  12/08/17 149 lb 3.2 oz (67.7  kg)   Physical Exam  Constitutional: Oriented to person, place, and time. Appears well-developed and well-nourished.  HENT:  Head: Normocephalic and atraumatic.  Eyes: Conjunctivae and EOM are normal.  Cardiovascular: Normal rate, regular rhythm, normal heart sounds and intact distal pulses.  No murmur heard. Pulmonary/Chest: Effort normal and breath sounds normal. No stridor. No respiratory distress. Has no wheezes.  Abdomen: nondistended, normoactive bs, soft, nontender Neurological: Is alert and oriented to person, place, and time.  Skin: Skin is warm. Capillary refill takes less than 2 seconds.  Psychiatric: Has a normal mood and affect. Behavior is normal. Judgment and thought content normal.    Health Maintenance Due  Topic Date Due  . PNEUMOCOCCAL POLYSACCHARIDE VACCINE AGE 45-64 HIGH RISK  06/13/1961  . PAP SMEAR-Modifier  01/15/2015  . OPHTHALMOLOGY EXAM  09/24/2017  . INFLUENZA VACCINE  08/15/2018  . URINE MICROALBUMIN  12/09/2018    There are no preventive care reminders to display for this patient.  Lab Results  Component Value Date   TSH 2.580 08/04/2017   Lab Results  Component Value Date   WBC 9.9 07/21/2018   HGB 11.5 07/21/2018   HCT 35.5 07/21/2018   MCV 70 (L) 07/21/2018   PLT 449 07/21/2018   Lab Results  Component Value Date   NA 140 10/12/2018   K 4.4 10/12/2018   CO2 21 10/12/2018   GLUCOSE 126 (H) 10/12/2018   BUN 10 10/12/2018   CREATININE 0.68 10/12/2018   BILITOT 0.3 10/12/2018   ALKPHOS 147 (H) 10/12/2018   AST 17 10/12/2018   ALT 24 10/12/2018   PROT 7.3 10/12/2018   ALBUMIN 4.7 10/12/2018   CALCIUM 10.4 (H) 10/12/2018   ANIONGAP 12 06/27/2018   Lab Results  Component Value Date   CHOL 182 10/12/2018   Lab Results  Component Value Date   HDL 34 (L) 10/12/2018   Lab Results   Component Value Date   LDLCALC 101 (H) 10/12/2018   Lab Results  Component Value Date   TRIG 275 (H) 10/12/2018   Lab Results  Component Value Date   CHOLHDL 5.4 (H) 10/12/2018   Lab Results  Component Value Date   HGBA1C 8.9 (A) 10/12/2018      Assessment & Plan:   Problem List Items Addressed This Visit      Cardiovascular and Mediastinum   Essential hypertension (Chronic)- Patient's blood pressure is at goal of 139/89 or less. Condition is stable. Continue current medications and treatment plan. I recommend that you exercise for 30-45 minutes 5 days a week. I also recommend a balanced diet with fruits and vegetables every day, lean meats, and little fried foods. The DASH diet (you can find this online) is a good example of this.    Relevant Medications   amLODipine (NORVASC) 5 MG tablet     Endocrine   DM type 2 (diabetes mellitus, type 2) ([place]) - Primary - well controlled hemoglobin a1c is at goal Continue exercise Lipids monitored and renal function in range On metformin NOT On ace or arb  pt does not want to change her bp med Reviewed diabetic foot care Emphasized importance of eye and dental exam      Relevant Medications   metFORMIN (GLUCOPHAGE) 500 MG tablet   Other Relevant Orders   Ambulatory referral to Ophthalmology   CMP14+EGFR (Completed)   Lipid panel (Completed)   POCT glycosylated hemoglobin (Hb A1C) (Completed)    Other Visit Diagnoses    Need for prophylactic vaccination and  inoculation against influenza       Need for prophylactic vaccination against Streptococcus pneumoniae (pneumococcus)       Strain of rectus abdominis muscle, subsequent encounter          Meds ordered this encounter  Medications  . amLODipine (NORVASC) 5 MG tablet    Sig: Take 1 tablet (5 mg total) by mouth daily.    Dispense:  90 tablet    Refill:  1  . metFORMIN (GLUCOPHAGE) 500 MG tablet    Sig: Take 1 and half tablet by  Mouth daily for diabetes     Dispense:  90 tablet    Refill:  1    Follow-up: Return in about 3 months (around 01/11/2019) for return to clinic in 3 months for diabetes check.    Forrest Moron, MD

## 2018-10-12 NOTE — Patient Instructions (Signed)
° ° ° °  If you have lab work done today you will be contacted with your lab results within the next 2 weeks.  If you have not heard from us then please contact us. The fastest way to get your results is to register for My Chart. ° ° °IF you received an x-ray today, you will receive an invoice from [facility]. Please contact [facility] at [phone] with questions or concerns regarding your invoice.  ° °IF you received labwork today, you will receive an invoice from [facility]. Please contact [facility] at [phone] with questions or concerns regarding your invoice.  ° °Our billing staff will not be able to assist you with questions regarding bills from these companies. ° °You will be contacted with the lab results as soon as they are available. The fastest way to get your results is to activate your My Chart account. Instructions are located on the last page of this paperwork. If you have not heard from us regarding the results in 2 weeks, please contact this office. °  ° ° ° °

## 2018-10-13 ENCOUNTER — Other Ambulatory Visit: Payer: Self-pay | Admitting: Family Medicine

## 2018-10-13 ENCOUNTER — Ambulatory Visit
Admission: RE | Admit: 2018-10-13 | Discharge: 2018-10-13 | Disposition: A | Discharge status: Home / Self Care | Payer: 59 | Source: Ambulatory Visit | Attending: Family Medicine | Admitting: Family Medicine

## 2018-10-13 DIAGNOSIS — R599 Enlarged lymph nodes, unspecified: Secondary | ICD-10-CM

## 2018-10-13 DIAGNOSIS — N6320 Unspecified lump in the left breast, unspecified quadrant: Secondary | ICD-10-CM

## 2018-10-13 LAB — CMP14+EGFR
ALT: 24 IU/L (ref 0–32)
AST: 17 IU/L (ref 0–40)
Albumin/Globulin Ratio: 1.8 (ref 1.2–2.2)
Albumin: 4.7 g/dL (ref 3.8–4.9)
Alkaline Phosphatase: 147 IU/L — ABNORMAL HIGH (ref 39–117)
BUN/Creatinine Ratio: 15 (ref 9–23)
BUN: 10 mg/dL (ref 6–24)
Bilirubin Total: 0.3 mg/dL (ref 0.0–1.2)
CO2: 21 mmol/L (ref 20–29)
Calcium: 10.4 mg/dL — ABNORMAL HIGH (ref 8.7–10.2)
Chloride: 101 mmol/L (ref 96–106)
Creatinine, Ser: 0.68 mg/dL (ref 0.57–1.00)
GFR calc Af Amer: 111 mL/min/{1.73_m2} (ref >59)
GFR calc non Af Amer: 96 mL/min/{1.73_m2} (ref >59)
Globulin, Total: 2.6 g/dL (ref 1.5–4.5)
Glucose: 126 mg/dL — ABNORMAL HIGH (ref 65–99)
Potassium: 4.4 mmol/L (ref 3.5–5.2)
Sodium: 140 mmol/L (ref 134–144)
Total Protein: 7.3 g/dL (ref 6.0–8.5)

## 2018-10-13 LAB — LIPID PANEL
Chol/HDL Ratio: 5.4 ratio — ABNORMAL HIGH (ref 0.0–4.4)
Cholesterol, Total: 182 mg/dL (ref 100–199)
HDL: 34 mg/dL — ABNORMAL LOW (ref >39)
LDL Chol Calc (NIH): 101 mg/dL — ABNORMAL HIGH (ref 0–99)
Triglycerides: 275 mg/dL — ABNORMAL HIGH (ref 0–149)
VLDL Cholesterol Cal: 47 mg/dL — ABNORMAL HIGH (ref 5–40)

## 2018-10-15 ENCOUNTER — Encounter: Payer: Self-pay | Admitting: Radiology

## 2018-10-15 DIAGNOSIS — C801 Malignant (primary) neoplasm, unspecified: Secondary | ICD-10-CM

## 2018-10-15 HISTORY — DX: Malignant (primary) neoplasm, unspecified: C80.1

## 2018-10-21 ENCOUNTER — Ambulatory Visit
Admission: RE | Admit: 2018-10-21 | Discharge: 2018-10-21 | Disposition: A | Discharge status: Home / Self Care | Payer: 59 | Source: Ambulatory Visit | Attending: Family Medicine | Admitting: Family Medicine

## 2018-10-21 ENCOUNTER — Other Ambulatory Visit: Payer: Self-pay | Admitting: Family Medicine

## 2018-10-21 ENCOUNTER — Other Ambulatory Visit: Payer: Self-pay

## 2018-10-21 DIAGNOSIS — N6320 Unspecified lump in the left breast, unspecified quadrant: Secondary | ICD-10-CM

## 2018-10-21 DIAGNOSIS — R599 Enlarged lymph nodes, unspecified: Secondary | ICD-10-CM

## 2018-10-23 ENCOUNTER — Telehealth: Payer: Self-pay | Admitting: Hematology and Oncology

## 2018-10-23 ENCOUNTER — Encounter: Payer: Self-pay | Admitting: *Deleted

## 2018-10-23 ENCOUNTER — Ambulatory Visit: Payer: 59 | Admitting: Family Medicine

## 2018-10-23 NOTE — Telephone Encounter (Signed)
Spoke to patient to confirm morning Erin Lawson [facility] appointment for 10/14, packet will be mailed to patient

## 2018-10-26 ENCOUNTER — Telehealth: Payer: Self-pay

## 2018-10-26 ENCOUNTER — Other Ambulatory Visit: Payer: Self-pay | Admitting: *Deleted

## 2018-10-26 DIAGNOSIS — Z171 Estrogen receptor negative status [ER-]: Secondary | ICD-10-CM | POA: Insufficient documentation

## 2018-10-26 DIAGNOSIS — C50212 Malignant neoplasm of upper-inner quadrant of left female breast: Secondary | ICD-10-CM | POA: Insufficient documentation

## 2018-10-26 NOTE — Telephone Encounter (Signed)
Pt. Called requesting referral to [facility], pt. Was recently diagnosed with cancer and wanted a second opinion. Was told she needed referral by PCP to be able to get one.

## 2018-10-27 ENCOUNTER — Other Ambulatory Visit: Payer: Self-pay | Admitting: Family Medicine

## 2018-10-27 DIAGNOSIS — C50912 Malignant neoplasm of unspecified site of left female breast: Secondary | ICD-10-CM

## 2018-10-27 NOTE — Telephone Encounter (Signed)
Dr. Bridget Hartshorn has spoke to pt and informed her of other options for a second option, also she has placed a referral. Pt has verbalized understanding.

## 2018-10-27 NOTE — Progress Notes (Signed)
[facility] CONSULT NOTE  Patient Care Team: Forrest Moron, MD as PCP - General (Internal Medicine) Mauro Kaufmann, RN as Oncology Nurse Navigator Rockwell Germany, RN as Oncology Nurse Navigator Erroll Luna, MD as Consulting Physician (General Surgery) Nicholas Lose, MD as Consulting Physician (Hematology and Oncology) Gery Pray, MD as Consulting Physician (Radiation Oncology)  CHIEF COMPLAINTS/PURPOSE OF CONSULTATION:  Newly diagnosed breast cancer  HISTORY OF PRESENTING ILLNESS:  Erin Lawson 58 y.o. female is here because of recent diagnosis of invasive ductal carcinoma of the left breast. The cancer was detected on a diagnostic mammogram on 10/13/18 after the patient palpated a left breast mass for 5 weeks. It showed a 2.8cm mass with calcifications spanning 3.2cm in the left breast and two mildly thickened axillary lymph nodes up to 0.4cm suspicious for metastatic disease. Biopsy on 10/21/18 showed invasive ductal carcinoma, grade 3, HER-2 positive (3+), ER/PR negative, Ki67 50%, and no evidence of carcinoma in the left axilla. She presents to the clinic today for initial evaluation and discussion of treatment options.   I reviewed her records extensively and collaborated the history with the patient.  SUMMARY OF ONCOLOGIC HISTORY: Oncology History  Malignant neoplasm of upper-inner quadrant of left breast in female, estrogen receptor negative ([place])  10/26/2018 Initial Diagnosis   Patient palpated a left breast mass x 5 weeks. Mammogram showed a 2.8cm mass with calcifications spanning 3.2cm in the left breast and two mildly thickened axillary lymph nodes up to 0.4cm. Biopsy showed IDC, grade 3, HER-2 + (3+), ER/PR -, Ki67 50%, and no evidence of carcinoma in the left axilla.      MEDICAL HISTORY:  Past Medical History:  Diagnosis Date  . DM type 2 (diabetes mellitus, type 2) ([place]) 05/18/2012  . Hypertension     SURGICAL HISTORY: Past Surgical  History:  Procedure Laterality Date  . CESAREAN SECTION    . utreine polyp     resected    SOCIAL HISTORY: Social History   Socioeconomic History  . Marital status: Single    Spouse name: Not on file  . Number of children: Not on file  . Years of education: Not on file  . Highest education level: Not on file  Occupational History  . Occupation: cna    Comment: [employer]  Social Needs  . Financial resource strain: Not on file  . Food insecurity    Worry: Not on file    Inability: Not on file  . Transportation needs    Medical: Not on file    Non-medical: Not on file  Tobacco Use  . Smoking status: Never Smoker  . Smokeless tobacco: Never Used  Substance and Sexual Activity  . Alcohol use: No  . Drug use: No  . Sexual activity: Not on file  Lifestyle  . Physical activity    Days per week: Not on file    Minutes per session: Not on file  . Stress: Not on file  Relationships  . Social Herbalist on phone: Not on file    Gets together: Not on file    Attends religious service: Not on file    Active member of club or organization: Not on file    Attends meetings of clubs or organizations: Not on file    Relationship status: Not on file  . Intimate partner violence    Fear of current or ex partner: Not on file    Emotionally  abused: Not on file    Physically abused: Not on file    Forced sexual activity: Not on file  Other Topics Concern  . 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 Hypertension Sister   . Heart attack Neg Hx     ALLERGIES:  is allergic to atenolol; poractant  alfa; pork-derived products; and meloxicam.  MEDICATIONS:  Current Outpatient Medications  Medication Sig Dispense Refill  . amLODipine (NORVASC) 5 MG tablet Take 1 tablet (5 mg total) by mouth daily. 90 tablet 1  . blood glucose meter kit and supplies KIT Dispense based on patient and insurance preference. Use up to four times daily as directed. (FOR ICD-9 250.00, 250.01). 1 each 0  . blood glucose meter kit and supplies Dispense based on patient and insurance preference. Use up to four times daily as directed. (FOR ICD-10 E10.9, E11.9). 1 each 0  . celecoxib (CELEBREX) 200 MG capsule TAKE 1 CAPSULE (200 MG TOTAL) BY MOUTH DAILY AS NEEDED. 30 capsule 0  . Ferrous Sulfate (IRON PO) Take by mouth. 1 teaspoon daily    . GARLIC PO Take by mouth.    Marland Kitchen glucose blood (CONTOUR NEXT TEST) test strip Check sugar once daily  Dx:  DMII controlled non-insulin dependent. 100 each 3  . LANCETS ULTRA FINE MISC Check sugar up one time daily dx: new onset DMII uncontrolled non-insulin 100 each 3  . metFORMIN (GLUCOPHAGE) 500 MG tablet Take 1 and half tablet by  Mouth daily for diabetes 90 tablet 1  . Multiple Minerals (CALCIUM/MAGNESIUM/ZINC PO) Take by mouth daily.    Marland Kitchen UNABLE TO FIND Take by mouth daily. Med Name: [place]    . UNABLE TO FIND Med Name: Apple Vinegar Mix - 1 teaspoon daily    . KAZANO 12.5-500 MG TABS Please specify directions, refills and quantity 60 tablet 1  . methocarbamol (ROBAXIN) 500 MG tablet Take 1 tablet (500 mg total) by mouth every 8 (eight) hours as needed for muscle spasms. (Patient not taking: Reported on 10/28/2018) 15 tablet 0  . triamcinolone cream (KENALOG) 0.1 % Apply 1 application topically 2 (two) times daily. (Patient not taking: Reported on 10/28/2018) 30 g 0   No current facility-administered medications for this visit.     REVIEW OF SYSTEMS:   Constitutional: Denies fevers, chills or abnormal night sweats Eyes: Denies blurriness of vision, double vision  or watery eyes Ears, nose, mouth, throat, and face: Denies mucositis or sore throat Respiratory: Denies cough, dyspnea or wheezes Cardiovascular: Denies palpitation, chest discomfort or lower extremity swelling Gastrointestinal:  Denies nausea, heartburn or change in bowel habits Skin: Denies abnormal skin rashes Lymphatics: Denies new lymphadenopathy or easy bruising Neurological:Denies numbness, tingling or new weaknesses Behavioral/Psych: Mood is stable, no new changes  Breast: Palpable left breast mass and skin thickening All other systems were reviewed with the patient and are negative.  PHYSICAL EXAMINATION: ECOG PERFORMANCE STATUS: 1 - Symptomatic but completely ambulatory  Vitals:   10/28/18 0847  BP: (!) 149/75  Pulse: 79  Resp: 19  Temp: 98 F (36.7 C)  SpO2: 100%   Filed Weights   10/28/18 0847  Weight: 145 lb 14.4 oz (66.2 kg)    GENERAL:alert, no distress and comfortable SKIN: skin color, texture, turgor are normal, no rashes or significant lesions EYES: normal, conjunctiva are pink and non-injected, sclera clear OROPHARYNX:no exudate, no erythema and lips, buccal mucosa, and tongue normal  NECK: supple, thyroid normal size, non-tender, without nodularity LYMPH:  no palpable lymphadenopathy in the cervical, axillary or inguinal LUNGS: clear to auscultation and percussion with normal breathing effort HEART: regular rate & rhythm and no murmurs and no lower extremity edema ABDOMEN:abdomen soft, non-tender and normal bowel sounds Musculoskeletal:no cyanosis of digits and no clubbing  PSYCH: alert & oriented x 3 with fluent speech NEURO: no focal motor/sensory deficits BREAST: Palpable lump in the left breast. No palpable axillary or supraclavicular lymphadenopathy (exam performed in the presence of a chaperone)   LABORATORY DATA:  I have reviewed the data as listed Lab Results  Component Value Date   WBC 9.0 10/28/2018   HGB 11.8 (L) 10/28/2018   HCT 37.9  10/28/2018   MCV 72.3 (L) 10/28/2018   PLT 411 (H) 10/28/2018   Lab Results  Component Value Date   NA 139 10/28/2018   K 4.2 10/28/2018   CL 102 10/28/2018   CO2 24 10/28/2018    RADIOGRAPHIC STUDIES: I have personally reviewed the radiological reports and agreed with the findings in the report.  ASSESSMENT AND PLAN:  Malignant neoplasm of upper-inner quadrant of left breast in female, estrogen receptor negative ([place]) 10/26/2018:Patient palpated a left breast mass x 5 weeks. Mammogram showed a 2.8cm mass with calcifications spanning 3.2cm in the left breast and two mildly thickened axillary lymph nodes up to 0.4cm. Biopsy showed IDC, grade 3, HER-2 + (3+), ER/PR -, Ki67 50%, and no evidence of carcinoma in the left axilla.   Pathology and radiology counseling: Discussed with the patient, the details of pathology including the type of breast cancer,the clinical staging, the significance of ER, PR and HER-2/neu receptors and the implications for treatment. After reviewing the pathology in detail, we proceeded to discuss the different treatment options between surgery, radiation, chemotherapy, antiestrogen therapies.  Recommendation based on multidisciplinary tumor board: 1. Neoadjuvant chemotherapy with TCH Perjeta 6 cycles followed by Herceptin Perjeta maintenance for 1 year 2. Followed by breast conserving surgery  with sentinel lymph node study 3. Followed by adjuvant radiation therapy   Chemotherapy Counseling: I discussed the risks and benefits of chemotherapy including the risks of nausea/ vomiting, risk of infection from low WBC count, fatigue due to chemo or anemia, bruising or bleeding due to low platelets, mouth sores, loss/ change in taste and decreased appetite. Liver and kidney function will be monitored through out chemotherapy as abnormalities in liver and kidney function may be a side effect of treatment. Cardiac dysfunction due to Herceptin and Perjeta were discussed in  detail. Risk of permanent bone marrow dysfunction due to chemo were also discussed.  Plan: 1. Port placement 2. Echocardiogram 3. Chemotherapy class 4. Breast MRI    Return to clinic in 2 weeks to start chemotherapy.  All questions were answered. The patient knows to call the clinic with any problems, questions or concerns.   Rulon Eisenmenger, MD 10/28/2018    I, Molly Dorshimer, am acting as scribe for Nicholas Lose, MD.  I have reviewed the above documentation for accuracy and completeness,  and I agree with the above.

## 2018-10-28 ENCOUNTER — Inpatient Hospital Stay: Payer: 59

## 2018-10-28 ENCOUNTER — Inpatient Hospital Stay: Payer: 59 | Attending: Hematology and Oncology | Admitting: Hematology and Oncology

## 2018-10-28 ENCOUNTER — Encounter: Payer: Self-pay | Admitting: Physical Therapy

## 2018-10-28 ENCOUNTER — Ambulatory Visit
Admission: RE | Admit: 2018-10-28 | Discharge: 2018-10-28 | Disposition: A | Discharge status: Home / Self Care | Payer: 59 | Source: Ambulatory Visit | Attending: Radiation Oncology | Admitting: Radiation Oncology

## 2018-10-28 ENCOUNTER — Ambulatory Visit: Payer: Self-pay | Admitting: Surgery

## 2018-10-28 ENCOUNTER — Other Ambulatory Visit: Payer: Self-pay

## 2018-10-28 ENCOUNTER — Ambulatory Visit
Admission: RE | Admit: 2018-10-28 | Discharge: 2018-10-28 | Disposition: A | Discharge status: Home / Self Care | Payer: 59 | Source: Ambulatory Visit | Attending: Hematology and Oncology | Admitting: Hematology and Oncology

## 2018-10-28 ENCOUNTER — Encounter: Payer: Self-pay | Admitting: *Deleted

## 2018-10-28 ENCOUNTER — Ambulatory Visit: Payer: 59 | Attending: Surgery | Admitting: Physical Therapy

## 2018-10-28 VITALS — BP 149/75 | HR 79 | Temp 98.0°F | Resp 19 | Ht 61.0 in | Wt 145.9 lb

## 2018-10-28 DIAGNOSIS — C50212 Malignant neoplasm of upper-inner quadrant of left female breast: Secondary | ICD-10-CM

## 2018-10-28 DIAGNOSIS — Z5112 Encounter for antineoplastic immunotherapy: Secondary | ICD-10-CM | POA: Diagnosis present

## 2018-10-28 DIAGNOSIS — Z171 Estrogen receptor negative status [ER-]: Secondary | ICD-10-CM

## 2018-10-28 DIAGNOSIS — R293 Abnormal posture: Secondary | ICD-10-CM | POA: Diagnosis present

## 2018-10-28 DIAGNOSIS — E119 Type 2 diabetes mellitus without complications: Secondary | ICD-10-CM | POA: Diagnosis not present

## 2018-10-28 DIAGNOSIS — Z5189 Encounter for other specified aftercare: Secondary | ICD-10-CM | POA: Diagnosis not present

## 2018-10-28 DIAGNOSIS — I10 Essential (primary) hypertension: Secondary | ICD-10-CM | POA: Insufficient documentation

## 2018-10-28 DIAGNOSIS — Z5111 Encounter for antineoplastic chemotherapy: Secondary | ICD-10-CM | POA: Diagnosis present

## 2018-10-28 LAB — CMP (CANCER CENTER ONLY)
ALT: 24 U/L (ref 0–44)
AST: 19 U/L (ref 15–41)
Albumin: 4.4 g/dL (ref 3.5–5.0)
Alkaline Phosphatase: 125 U/L (ref 38–126)
Anion gap: 13 (ref 5–15)
BUN: 13 mg/dL (ref 6–20)
CO2: 24 mmol/L (ref 22–32)
Calcium: 9.5 mg/dL (ref 8.9–10.3)
Chloride: 102 mmol/L (ref 98–111)
Creatinine: 0.86 mg/dL (ref 0.44–1.00)
GFR, Est AFR Am: >60 mL/min (ref >60)
GFR, Estimated: >60 mL/min (ref >60)
Glucose, Bld: 234 mg/dL — ABNORMAL HIGH (ref 70–99)
Potassium: 4.2 mmol/L (ref 3.5–5.1)
Sodium: 139 mmol/L (ref 135–145)
Total Bilirubin: 0.5 mg/dL (ref 0.3–1.2)
Total Protein: 7.6 g/dL (ref 6.5–8.1)

## 2018-10-28 LAB — CBC WITH DIFFERENTIAL (CANCER CENTER ONLY)
Abs Immature Granulocytes: 0.06 10*3/uL (ref 0.00–0.07)
Basophils Absolute: 0.1 10*3/uL (ref 0.0–0.1)
Basophils Relative: 1 %
Eosinophils Absolute: 0.2 10*3/uL (ref 0.0–0.5)
Eosinophils Relative: 2 %
HCT: 37.9 % (ref 36.0–46.0)
Hemoglobin: 11.8 g/dL — ABNORMAL LOW (ref 12.0–15.0)
Immature Granulocytes: 1 %
Lymphocytes Relative: 35 %
Lymphs Abs: 3.1 10*3/uL (ref 0.7–4.0)
MCH: 22.5 pg — ABNORMAL LOW (ref 26.0–34.0)
MCHC: 31.1 g/dL (ref 30.0–36.0)
MCV: 72.3 fL — ABNORMAL LOW (ref 80.0–100.0)
Monocytes Absolute: 0.6 10*3/uL (ref 0.1–1.0)
Monocytes Relative: 6 %
Neutro Abs: 5 10*3/uL (ref 1.7–7.7)
Neutrophils Relative %: 55 %
Platelet Count: 411 10*3/uL — ABNORMAL HIGH (ref 150–400)
RBC: 5.24 MIL/uL — ABNORMAL HIGH (ref 3.87–5.11)
RDW: 13.6 % (ref 11.5–15.5)
WBC Count: 9 10*3/uL (ref 4.0–10.5)
nRBC: 0 % (ref 0.0–0.2)

## 2018-10-28 MED ORDER — LIDOCAINE-PRILOCAINE 2.5-2.5 % EX CREA: TOPICAL_CREAM | CUTANEOUS | 3 refills | Status: DC

## 2018-10-28 MED ORDER — GADOBUTROL 1 MMOL/ML IV SOLN
7.0000 mL | Freq: Once | INTRAVENOUS | Status: AC | PRN
  Administered 2018-10-28: 7 mL via INTRAVENOUS

## 2018-10-28 MED ORDER — LORAZEPAM 0.5 MG PO TABS: 0.5000 mg | ORAL_TABLET | Freq: Every evening | ORAL | 0 refills | Status: DC | PRN

## 2018-10-28 MED ORDER — DEXAMETHASONE 4 MG PO TABS: 4.0000 mg | ORAL_TABLET | Freq: Two times a day (BID) | ORAL | 0 refills | Status: DC

## 2018-10-28 MED ORDER — ONDANSETRON HCL 8 MG PO TABS: 8.0000 mg | ORAL_TABLET | Freq: Two times a day (BID) | ORAL | 1 refills | Status: DC | PRN

## 2018-10-28 MED ORDER — PROCHLORPERAZINE MALEATE 10 MG PO TABS: 10.0000 mg | ORAL_TABLET | Freq: Four times a day (QID) | ORAL | 1 refills | Status: DC | PRN

## 2018-10-28 NOTE — Progress Notes (Signed)
Radiation Oncology         ([phone]) [phone] ________________________________  Multidisciplinary Breast Oncology Clinic [facility]) Initial Outpatient Consultation  Name: Erin Lawson MRN: [id number]  Date: 10/28/2018  DOB: [birth year]  [id number], Arlie Solomons, MD  Erroll Luna, MD   REFERRING PHYSICIAN: Erroll Luna, MD  DIAGNOSIS: The encounter diagnosis was Malignant neoplasm of upper-inner quadrant of left breast in female, estrogen receptor negative ([place]).  Clinical Stage T2 N0 Left Breast UIQ, Invasive Ductal Carcinoma with DCIS, ER- / PR- / Her2+, Grade 2    ICD-10-CM   1. Malignant neoplasm of upper-inner quadrant of left breast in female, estrogen receptor negative ([place])  C50.212    Z17.1     HISTORY OF PRESENT ILLNESS::Erin Lawson is a 58 y.o. female who is presenting to the office today for evaluation of her newly diagnosed breast cancer. She is unaccompanied. She is doing well overall.   She presented with a palpable area of concern in the left breast for approximately 5 weeks. She underwent bilateral diagnostic mammography with tomography and left breast ultrasonography at [facility] on 10/13/2018 showing: 2.8 cm irregular mass in the left breast at 11:30; two mildly thickened left axillary lymph nodes suspicious for metastatic disease; no evidence of malignancy in the right breast.  Biopsy on 10/21/2018 showed: invasive ductal carcinoma, grade 2; DCIS. Prognostic indicators significant for: estrogen receptor, 0% negative and progesterone receptor, 0% negative. Proliferation marker Ki67 at 50%. HER2 positive. Biopsied lymph node was negative for metastatic carcinoma.  Menarche: 58 years old Age at first live birth: 58 years old GP: 2 (twins) LMP: age 41-54 Contraceptive: yes, used for a short time at age 37 but experienced excess bleeding HRT: no   The patient was referred today for presentation in the multidisciplinary conference.  Radiology studies and  pathology slides were presented there for review and discussion of treatment options.  A consensus was discussed regarding potential next steps.  PREVIOUS RADIATION THERAPY: No  PAST MEDICAL HISTORY:  Past Medical History:  Diagnosis Date   DM type 2 (diabetes mellitus, type 2) ([place]) 05/18/2012   Hypertension     PAST SURGICAL HISTORY: Past Surgical History:  Procedure Laterality Date   CESAREAN SECTION     utreine polyp     resected    FAMILY HISTORY:  Family History  Problem Relation Age of Onset   Hypertension Sister    Heart attack Neg Hx     SOCIAL HISTORY:  Social History   Socioeconomic History   Marital status: Single    Spouse name: Not on file   Number of children: Not on file   Years of education: Not on file   Highest education level: Not on file  Occupational History   Occupation: cna    Comment: [employer]   Financial resource strain: Not on file   Food insecurity    Worry: Not on file    Inability: Not on file   Transportation needs    Medical: Not on file    Non-medical: Not on file  Tobacco Use   Smoking status: Never Smoker   Smokeless tobacco: Never Used  Substance and Sexual Activity   Alcohol use: No   Drug use: No   Sexual activity: Not on file  Lifestyle   Physical activity    Days per week: Not on file    Minutes per session: Not on file   Stress: Not on file  Relationships   Social connections    Talks on phone: Not on file    Gets together: Not on file    Attends religious service: Not on file    Active member of club or organization: Not on file    Attends meetings of clubs or organizations: Not on file    Relationship status: Not on file  Other Topics Concern   Not on file  Social History Narrative   Marital status: single; not dating      Children: twins in 58; 3 grandchildren/boys      Lives: with friend, son      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 90 tablet 1   blood glucose meter kit and supplies KIT Dispense based on patient and insurance preference. Use up to four times daily as directed. (FOR ICD-9 250.00, 250.01). 1 each 0   blood glucose meter kit and supplies Dispense based on patient and insurance preference. Use up to four times daily as directed. (FOR ICD-10 E10.9, E11.9). 1 each 0   celecoxib (CELEBREX) 200 MG capsule TAKE 1 CAPSULE (200 MG TOTAL) BY MOUTH DAILY AS NEEDED. 30 capsule 0   dexamethasone (DECADRON) 4 MG tablet Take 1 tablet (4 mg total) by mouth 2 (two) times daily. Take 1 tablet day before chemo and 1 tablet day after chemo with food 12 tablet 0   Ferrous Sulfate (IRON PO) Take by mouth. 1 teaspoon daily     GARLIC PO Take by mouth.     glucose blood (CONTOUR NEXT TEST) test strip Check sugar once daily  Dx:  DMII controlled non-insulin dependent. 100 each 3   KAZANO 12.5-500 MG TABS Please specify directions, refills and quantity 60 tablet 1   LANCETS ULTRA FINE MISC Check sugar up one time daily dx: new onset DMII uncontrolled non-insulin 100 each 3   lidocaine-prilocaine (EMLA) cream Apply to affected area once 30 g 3   LORazepam (ATIVAN) 0.5 MG tablet Take 1 tablet (0.5 mg total) by mouth at bedtime as needed for sleep. 30 tablet 0   metFORMIN (GLUCOPHAGE) 500 MG tablet Take 1 and half tablet by  Mouth daily for diabetes 90 tablet 1    methocarbamol (ROBAXIN) 500 MG tablet Take 1 tablet (500 mg total) by mouth every 8 (eight) hours as needed for muscle spasms. (Patient not taking: Reported on 10/28/2018) 15 tablet 0   Multiple Minerals (CALCIUM/MAGNESIUM/ZINC PO) Take by mouth daily.     ondansetron (ZOFRAN) 8 MG tablet Take 1 tablet (8 mg total) by mouth 2 (two) times daily as needed (Nausea or vomiting). Begin 4 days after chemotherapy. 30 tablet 1   prochlorperazine (COMPAZINE) 10 MG tablet Take 1 tablet (10 mg total) by mouth every 6 (six) hours as needed (Nausea or vomiting). [place]  tablet 1   triamcinolone cream (KENALOG) 0.1 % Apply 1 application topically 2 (two) times daily. (Patient not taking: Reported on 10/28/2018) 30 g 0   UNABLE TO FIND Take by mouth daily. Med Name: [place] TO FIND Med Name: Apple Vinegar Mix - 1 teaspoon daily     No current facility-administered medications for this encounter.     REVIEW OF SYSTEMS: A 10+ POINT REVIEW OF SYSTEMS WAS OBTAINED including neurology, dermatology, psychiatry, cardiac, respiratory, lymph, extremities, GI, GU, musculoskeletal, constitutional, reproductive, HEENT. On the provided form, she reports wearing glasses, palpable breast lump with pain and itching, diabetes, and anemia. She denies cough, shortness of breath, sinus problems, nausea or vomiting, and any other symptoms.    PHYSICAL EXAM:  Vitals with BMI 10/28/2018  Height '5\' 1"'   Weight 145 lbs 14 oz  BMI 02.40  Systolic 973  Diastolic 75  Pulse 79  Lungs are clear to auscultation bilaterally. Heart has regular rate and rhythm. No palpable cervical, supraclavicular, or axillary adenopathy. Abdomen soft, non-tender, normal bowel sounds. Breast: right breast with no palpable mass, nipple discharge, or bleeding. Left breast with two bandages in place from her recent biopsies. She has a palpable mass in the superior aspect of the breast, estimated to be approximately 3 x 3.5 cm. No nipple  discharge or bleeding.  ECOG = 1  0 - Asymptomatic (Fully active, able to carry on all predisease activities without restriction)  1 - Symptomatic but completely ambulatory (Restricted in physically strenuous activity but ambulatory and able to carry out work of a light or sedentary nature. For example, light housework, office work)  2 - Symptomatic, <50% in bed during the day (Ambulatory and capable of all self care but unable to carry out any work activities. Up and about more than 50% of waking hours)  3 - Symptomatic, >50% in bed, but not bedbound (Capable of only limited self-care, confined to bed or chair 50% or more of waking hours)  4 - Bedbound (Completely disabled. Cannot carry on any self-care. Totally confined to bed or chair)  5 - Death   Eustace Pen MM, Creech RH, Tormey DC, et al. [phone]). "Toxicity and response criteria of the [facility]". [place] Oncol. 5 (6): 649-55  LABORATORY DATA:  Lab Results  Component Value Date   WBC 9.0 10/28/2018   HGB 11.8 (L) 10/28/2018   HCT 37.9 10/28/2018   MCV 72.3 (L) 10/28/2018   PLT 411 (H) 10/28/2018   Lab Results  Component Value Date   NA 139 10/28/2018   K 4.2 10/28/2018   CL 102 10/28/2018   CO2 24 10/28/2018   Lab Results  Component Value Date   ALT 24 10/28/2018   AST 19 10/28/2018   ALKPHOS 125 10/28/2018   BILITOT 0.5 10/28/2018    PULMONARY FUNCTION TEST:   Recent Review Flowsheet Data    There is no flowsheet data to display.      RADIOGRAPHY: Mr Breast Bilateral W Wo Contrast Inc Cad  Result Date: 10/28/2018 CLINICAL DATA:  58 year old female with newly diagnosed left breast invasive ductal carcinoma. LABS:  None performed today and site. EXAM: BILATERAL BREAST MRI WITH AND WITHOUT CONTRAST TECHNIQUE: Multiplanar, multisequence MR images of both breasts were obtained prior to and following the intravenous administration of 7 ml of Gadavist. Three-dimensional MR images were rendered  by post-processing of the original MR data on an independent workstation. The three-dimensional MR images were  interpreted, and findings are reported in the following complete MRI report for this study. Three dimensional images were evaluated at the independent DynaCad workstation COMPARISON:  Previous exam(s). FINDINGS: Breast composition: c. Heterogeneous fibroglandular tissue. Background parenchymal enhancement: Moderate. Right breast: An oval enhancing mass, likely located within the skin is noted in the central inferior right breast anteriorly (series 5, image 96/144). It measures 9 x 5 x 9 mm (transverse by AP by craniocaudal dimensions). There is associated T2 hyperintensity. This is felt to most likely represent a benign skin finding. Otherwise, no suspicious mass or abnormal enhancement. Left breast: An irregular enhancing mass with associated post biopsy changes is noted in the superior slightly medial right breast at anterior depth. This is consistent with the patient's biopsy-proven malignancy (series 5, image 66/144). It measures 2.7 x 1.6 x 1.5 cm (transverse by AP by craniocaudal dimensions). Associated non mass enhancement is seen extending superiorly from the mass (series 5, image 56/144. It measures 3 x 1.3 x 2.4 cm (transverse by AP by craniocaudal dimensions). Additionally, there is a round, circumscribed enhancing mass inferior to the index lesion, within the upper outer quadrant at middle depth (series 5, slice 10/258). It measures 5 x 6 x 6 mm (transverse by AP by craniocaudal dimensions. There is mild for fat signal intensity on associated T1 correlate, suggesting a possible intramammary lymph node. Lymph nodes: No abnormal appearing lymph nodes. Ancillary findings:  None. IMPRESSION: 1. Enhancing mass in the superomedial right breast consistent with the patient's biopsy-proven malignancy. 2. Associated non mass enhancement extending superiorly from the index lesion by approximately 3 cm. 3.  Indeterminate 5-63m enhancing mass in the upper outer left breast, just inferior and lateral to the index lesion. This may represent an intramammary lymph node. 4. Likely benign, dermal lesion along the central inferior right breast. No MRI evidence for malignancy within the right breast. RECOMMENDATION: 1. Recommendation is for second look ultrasound to evaluate the non mass enhancement extending superiorly from the index lesion as well as the indeterminate 5-6 mm mass in the upper outer left breast. If suspicious ultrasound correlates are identified, ultrasound biopsy is recommended. If no correlates are identified, MRI biopsy of both sites is recommended. 2. At the time of ultrasound evaluation, recommendation is for clinical/ultrasound evaluation of the skin along the inferior portion of the right breast for a correlate to the likely benign 9 mm mass. BI-RADS CATEGORY  4: Suspicious. Electronically Signed   By: SKristopher OppenheimM.D.   On: 10/28/2018 15:08   UKoreaBreast Ltd Uni Left Inc Axilla  Addendum Date: 10/22/2018   ADDENDUM REPORT: 10/22/2018 12:52 ADDENDUM: There is an error in the FINDINGS and IMPRESSION of the prior report, however the RECOMMENDATION and BI-RADS CATEGORY are unchanged. The FINDINGS section should include the sentence: "No suspicious mass, microcalcification, or other finding is identified in the right breast." The IMPRESSION of the report should be as follows: 1. Palpable irregular mass in the left breast is suspicious for malignancy. Two mildly thickened left axillary lymph nodes are suspicious for metastatic disease. 2. No evidence of malignancy in the right breast. Electronically Signed   By: TZerita BoersM.D.   On: 10/22/2018 12:52   Result Date: 10/22/2018 CLINICAL DATA:  Palpable area of concern in the left breast for approximately 5 weeks. EXAM: DIGITAL DIAGNOSTIC BILATERAL MAMMOGRAM WITH CAD AND TOMO ULTRASOUND LEFT BREAST COMPARISON:  None ACR Breast Density Category c: The  breast tissue is heterogeneously dense, which may obscure small masses. FINDINGS:  An irregular mass is seen in the upper inner left breast at anterior depth measuring 2.8 cm. There are associated regional fine pleomorphic microcalcifications which span at 3.2 cm in greatest dimension. An overlying palpable skin marker is identified. A circumscribed mass is seen in the upper outer left breast measuring 0.5 cm. Mammographic images were processed with CAD. Targeted left breast ultrasound was performed. At 11:30 o'clock 2 cm from the nipple an irregular hypoechoic mass measuring 2.8 x 1.5 by 1.9 cm is seen. This corresponds to the mass seen on mammogram and the palpable area of concern. At 2 o'clock 3 cm from nipple a normal-appearing intramammary lymph node is identified. This corresponds to the circumscribed mass seen in the upper outer left breast. Two left axillary lymph nodes demonstrate mild cortical thickening up to 0.4 cm. IMPRESSION: Palpable irregular mass in the left breast is suspicious for malignancy. Two mildly thickened left axillary lymph nodes are suspicious for metastatic disease. RECOMMENDATION: Recommend ultrasound-guided biopsy of the irregular mass and one of the two mildly thickened left axillary lymph nodes. I have discussed the findings and recommendations with the patient. If applicable, a reminder letter will be sent to the patient regarding the next appointment. BI-RADS CATEGORY  4: Suspicious. Electronically Signed: By: Zerita Boers M.D. On: 10/13/2018 15:34   Mm Diag Breast Tomo Bilateral  Addendum Date: 10/22/2018   ADDENDUM REPORT: 10/22/2018 12:52 ADDENDUM: There is an error in the FINDINGS and IMPRESSION of the prior report, however the RECOMMENDATION and BI-RADS CATEGORY are unchanged. The FINDINGS section should include the sentence: "No suspicious mass, microcalcification, or other finding is identified in the right breast." The IMPRESSION of the report should be as follows: 1.  Palpable irregular mass in the left breast is suspicious for malignancy. Two mildly thickened left axillary lymph nodes are suspicious for metastatic disease. 2. No evidence of malignancy in the right breast. Electronically Signed   By: Zerita Boers M.D.   On: 10/22/2018 12:52   Result Date: 10/22/2018 CLINICAL DATA:  Palpable area of concern in the left breast for approximately 5 weeks. EXAM: DIGITAL DIAGNOSTIC BILATERAL MAMMOGRAM WITH CAD AND TOMO ULTRASOUND LEFT BREAST COMPARISON:  None ACR Breast Density Category c: The breast tissue is heterogeneously dense, which may obscure small masses. FINDINGS: An irregular mass is seen in the upper inner left breast at anterior depth measuring 2.8 cm. There are associated regional fine pleomorphic microcalcifications which span at 3.2 cm in greatest dimension. An overlying palpable skin marker is identified. A circumscribed mass is seen in the upper outer left breast measuring 0.5 cm. Mammographic images were processed with CAD. Targeted left breast ultrasound was performed. At 11:30 o'clock 2 cm from the nipple an irregular hypoechoic mass measuring 2.8 x 1.5 by 1.9 cm is seen. This corresponds to the mass seen on mammogram and the palpable area of concern. At 2 o'clock 3 cm from nipple a normal-appearing intramammary lymph node is identified. This corresponds to the circumscribed mass seen in the upper outer left breast. Two left axillary lymph nodes demonstrate mild cortical thickening up to 0.4 cm. IMPRESSION: Palpable irregular mass in the left breast is suspicious for malignancy. Two mildly thickened left axillary lymph nodes are suspicious for metastatic disease. RECOMMENDATION: Recommend ultrasound-guided biopsy of the irregular mass and one of the two mildly thickened left axillary lymph nodes. I have discussed the findings and recommendations with the patient. If applicable, a reminder letter will be sent to the patient regarding the next appointment. BI-RADS  CATEGORY  4: Suspicious. Electronically Signed: By: Zerita Boers M.D. On: 10/13/2018 15:34   [name] Axillary Node Core Biopsy Left  Addendum Date: 10/22/2018   ADDENDUM REPORT: 10/22/2018 14:10 ADDENDUM: Pathology revealed GRADE II INVASIVE DUCTAL CARCINOMA, DUCTAL CARCINOMA IN SITU of the LEFT breast, 11:30 o'clock. This was found to be concordant by Dr. Hassan Rowan. Pathology revealed LYMPH NODE TISSUE WITH NO METASTATIC CARCINOMA of the LEFT axilla. This was found to be concordant by Dr. Hassan Rowan. Pathology results were discussed with the patient by telephone. The patient reported doing well after the biopsies with tenderness at the sites. Post biopsy instructions and care were reviewed and questions were answered. The patient was encouraged to call [facility] for any additional concerns. The patient was referred to [facility] on October 28, 2018. Pathology results reported by Terie Purser, RN on 10/22/2018. Electronically Signed   By: Margarette Canada M.D.   On: 10/22/2018 14:10   Result Date: 10/22/2018 CLINICAL DATA:  58 year old female for tissue sampling of UPPER INNER LEFT breast mass and abnormal LEFT axillary lymph node. EXAM: ULTRASOUND GUIDED LEFT BREAST CORE NEEDLE BIOPSY ULTRASOUND-GUIDED CORE BIOPSY OF A LEFT AXILLARY LYMPH NODE COMPARISON:  Previous exam(s). FINDINGS: I met with the patient and we discussed the procedure of ultrasound-guided biopsy, including benefits and alternatives. We discussed the high likelihood of a successful procedure. We discussed the risks of the procedure, including infection, bleeding, tissue injury, clip migration, and inadequate sampling. Informed written consent was given. The usual time-out protocol was performed immediately prior to the procedure. ULTRASOUND-GUIDED LEFT BREAST BIOPSY: Lesion quadrant: UPPER INNER LEFT breast Using sterile technique and 1% Lidocaine as  local anesthetic, under direct ultrasound visualization, a 12 gauge spring-loaded device was used to perform biopsy of the 2.8 cm mass at the 11:30 position of the LEFT breast using a MEDIAL approach. At the conclusion of the procedure RIBBON tissue marker clip was deployed into the biopsy cavity. Follow up 2 view mammogram was performed and dictated separately. ULTRASOUND-GUIDED LEFT AXILLARY LYMPH NODE BIOPSY: Using sterile technique and 1% Lidocaine as local anesthetic, under direct ultrasound visualization, a 14 gauge spring-loaded device was used to perform biopsy of 1 of the LEFT axillary lymph nodes with focal cortical thickening using a MEDIAL approach. At the conclusion of the procedure Q tissue marker clip was deployed into the biopsy cavity. Follow up 2 view mammogram was performed and dictated separately. IMPRESSION: Ultrasound guided biopsy of 2.8 cm UPPER INNER LEFT breast mass and LEFT axillary lymph node with focal cortical thickening. No apparent complications. Electronically Signed: By: Margarette Canada M.D. On: 10/21/2018 15:01   Mm Clip Placement Left  Result Date: 10/21/2018 CLINICAL DATA:  Evaluate placement of RIBBON clip following LEFT breast biopsy and placement of q clip following LEFT axillary biopsy. EXAM: DIAGNOSTIC LEFT MAMMOGRAM POST ULTRASOUND BIOPSY COMPARISON:  Previous exam(s). FINDINGS: Mammographic images were obtained following ultrasound guided biopsy of the 2.8 cm UPPER INNER LEFT breast mass and LEFT axillary lymph node with focal cortical thickening. The RIBBON clip is in satisfactory position at the site of UPPER INNER LEFT breast mass biopsy. The Q clip is in satisfactory position at the site of LEFT axillary lymph node biopsy. IMPRESSION: Satisfactory positions of RIBBON clip and Q clip following ultrasound-guided LEFT breast and LEFT axillary biopsies. Final Assessment: Post Procedure Mammograms for Marker Placement Electronically Signed   By: Cleatis Polka.D.  On:  10/21/2018 15:37   [name] Lt Breast Bx W Loc Dev 1st Lesion Img Bx Spec US Guide  Addendum Date: 10/22/2018   ADDENDUM REPORT: 10/22/2018 14:10 ADDENDUM: Pathology revealed GRADE II INVASIVE DUCTAL CARCINOMA, DUCTAL CARCINOMA IN SITU of the LEFT breast, 11:30 o'clock. This was found to be concordant by Dr. Hassan Rowan. Pathology revealed LYMPH NODE TISSUE WITH NO METASTATIC CARCINOMA of the LEFT axilla. This was found to be concordant by Dr. Hassan Rowan. Pathology results were discussed with the patient by telephone. The patient reported doing well after the biopsies with tenderness at the sites. Post biopsy instructions and care were reviewed and questions were answered. The patient was encouraged to call [facility] for any additional concerns. The patient was referred to [facility] on October 28, 2018. Pathology results reported by Terie Purser, RN on 10/22/2018. Electronically Signed   By: Margarette Canada M.D.   On: 10/22/2018 14:10   Result Date: 10/22/2018 CLINICAL DATA:  58 year old female for tissue sampling of UPPER INNER LEFT breast mass and abnormal LEFT axillary lymph node. EXAM: ULTRASOUND GUIDED LEFT BREAST CORE NEEDLE BIOPSY ULTRASOUND-GUIDED CORE BIOPSY OF A LEFT AXILLARY LYMPH NODE COMPARISON:  Previous exam(s). FINDINGS: I met with the patient and we discussed the procedure of ultrasound-guided biopsy, including benefits and alternatives. We discussed the high likelihood of a successful procedure. We discussed the risks of the procedure, including infection, bleeding, tissue injury, clip migration, and inadequate sampling. Informed written consent was given. The usual time-out protocol was performed immediately prior to the procedure. ULTRASOUND-GUIDED LEFT BREAST BIOPSY: Lesion quadrant: UPPER INNER LEFT breast Using sterile technique and 1% Lidocaine as local anesthetic, under direct ultrasound  visualization, a 12 gauge spring-loaded device was used to perform biopsy of the 2.8 cm mass at the 11:30 position of the LEFT breast using a MEDIAL approach. At the conclusion of the procedure RIBBON tissue marker clip was deployed into the biopsy cavity. Follow up 2 view mammogram was performed and dictated separately. ULTRASOUND-GUIDED LEFT AXILLARY LYMPH NODE BIOPSY: Using sterile technique and 1% Lidocaine as local anesthetic, under direct ultrasound visualization, a 14 gauge spring-loaded device was used to perform biopsy of 1 of the LEFT axillary lymph nodes with focal cortical thickening using a MEDIAL approach. At the conclusion of the procedure Q tissue marker clip was deployed into the biopsy cavity. Follow up 2 view mammogram was performed and dictated separately. IMPRESSION: Ultrasound guided biopsy of 2.8 cm UPPER INNER LEFT breast mass and LEFT axillary lymph node with focal cortical thickening. No apparent complications. Electronically Signed: By: Margarette Canada M.D. On: 10/21/2018 15:01      IMPRESSION: Clinical Stage T2 Left Breast UIQ, Invasive Ductal Carcinoma with DCIS, ER- / PR- / Her2+, Grade 2  Patient will be a good candidate for breast conservation with radiotherapy to the left breast. We discussed the general course of radiation, potential side effects, and toxicities with radiation and the patient is interested in this approach.    PLAN:  1. Bilateral breast MRI 2. Neoadjuvant chemotherapy 3. Left lumpectomy with sentinel lymph node biopsy 4. Adjuvant radiation therapy   ------------------------------------------------  Blair Promise, PhD, MD  This document serves as a record of services personally performed by Gery Pray, MD. It was created on his behalf by Wilburn Mylar, a trained medical scribe. The creation of this record is based on the scribe's personal observations and the provider's  statements to them. This document has been checked and approved by the  attending provider.

## 2018-10-28 NOTE — Patient Instructions (Signed)

## 2018-10-28 NOTE — Progress Notes (Signed)
Clinical Social Work [place] Psychosocial Distress Screening [place]  Patient completed distress screening protocol and scored a 9 on the Psychosocial Distress Thermometer which indicates severe distress. Clinical Social Worker met with patient family in [facility] to assess for distress and other psychosocial needs. Patient stated she was feeling overwhelmed but felt comfortable after meeting with the treatment team and getting more information on her treatment plan. CSW and patient discussed common feeling and emotions when being diagnosed with cancer, and the importance of support during treatment.  Patient identified financial concern and maintaining her employment as her biggest stressors. CSW informed patient of the support team and support services at [facility].  CSW team will plant to meet with patient at her next appointment to discuss financial resources and applications.  CSW also encouraged patient to meet with the financial advocate once she begins treatment.  CSW provided contact information and encouraged patient to call with any questions or concerns.  ONCBCN DISTRESS SCREENING 10/28/2018  Screening Type Initial Screening  Distress experienced in past week (1-10) 9  Practical problem type Work/school  Emotional problem type Nervousness/Anxiety;Adjusting to illness;Isolation/feeling alone;Adjusting to appearance changes  Spiritual/Religous concerns type Facing my mortality  Physical Problem type Skin dry/itchy  Physician notified of physical symptoms Yes     Johnnye Lana, MSW, LCSW, OSW-C Clinical Social Worker [place] [phone]

## 2018-10-28 NOTE — Assessment & Plan Note (Signed)
10/26/2018:Patient palpated a left breast mass x 5 weeks. Mammogram showed a 2.8cm mass with calcifications spanning 3.2cm in the left breast and two mildly thickened axillary lymph nodes up to 0.4cm. Biopsy showed IDC, grade 3, HER-2 + (3+), ER/PR -, Ki67 50%, and no evidence of carcinoma in the left axilla.   Pathology and radiology counseling: Discussed with the patient, the details of pathology including the type of breast cancer,the clinical staging, the significance of ER, PR and HER-2/neu receptors and the implications for treatment. After reviewing the pathology in detail, we proceeded to discuss the different treatment options between surgery, radiation, chemotherapy, antiestrogen therapies.  Recommendation based on multidisciplinary tumor board: 1. Neoadjuvant chemotherapy with TCH Perjeta 6 cycles followed by Herceptin Perjeta maintenance for 1 year 2. Followed by breast conserving surgery  with sentinel lymph node study 3. Followed by adjuvant radiation therapy   Chemotherapy Counseling: I discussed the risks and benefits of chemotherapy including the risks of nausea/ vomiting, risk of infection from low WBC count, fatigue due to chemo or anemia, bruising or bleeding due to low platelets, mouth sores, loss/ change in taste and decreased appetite. Liver and kidney function will be monitored through out chemotherapy as abnormalities in liver and kidney function may be a side effect of treatment. Cardiac dysfunction due to Herceptin and Perjeta were discussed in detail. Risk of permanent bone marrow dysfunction due to chemo were also discussed.  Plan: 1. Port placement 2. Echocardiogram 3. Chemotherapy class 4. Breast MRI    Return to clinic in 2 weeks to start chemotherapy.

## 2018-10-28 NOTE — Therapy (Signed)
[place], [place], [address] Phone: [phone]   Fax:  [phone]  Physical Therapy Evaluation  Patient Details  Name: Erin Lawson MRN: [id number] Date of Birth: 05/06/1960 Referring Provider (PT): Dr. Erroll Luna   Encounter Date: 10/28/2018  PT End of Session - 10/28/18 0959    Visit Number  1    Number of Visits  1    Date for PT Re-Evaluation  12/23/18    PT Start Time  1030    PT Stop Time  1052    PT Time Calculation (min)  22 min    Activity Tolerance  Patient tolerated treatment well    Behavior During Therapy  [facility] for tasks assessed/performed       Past Medical History:  Diagnosis Date  . DM type 2 (diabetes mellitus, type 2) ([place]) 05/18/2012  . Hypertension     Past Surgical History:  Procedure Laterality Date  . CESAREAN SECTION    . utreine polyp     resected    There were no vitals filed for this visit.   Subjective Assessment - 10/28/18 0947    Subjective  Patient reports she is here today to be seen by her medical team for her newly diagnosed left breast cancer.    Pertinent History  Patient was diagnosed on 10/13/2018 with left grade II invasive ductal carcinoma breast cancer. It measures 2.8 cm and is located in the upper inner quadrant. It is ER/PR negative and HER2 positive with a Ki67 of 50%.    Patient Stated Goals  Reduce lymphedema risk and learn post op shoulder ROM HEP    Currently in Pain?  No/denies         [facility] PT Assessment - 10/28/18 0001      Assessment   Medical Diagnosis  Left breast cancer    Referring Provider (PT)  Dr. [name] Cornett    Onset Date/Surgical Date  10/13/18    Hand Dominance  Left    Prior Therapy  PT at [facility] clinic for sports hernia 09/2018      Precautions   Precautions  Other (comment)    Precaution Comments  active cancer      Restrictions   Weight Bearing Restrictions  No      Balance Screen   Has the  patient fallen in the past 6 months  No    Has the patient had a decrease in activity level because of a fear of falling?   No    Is the patient reluctant to leave their home because of a fear of falling?   No      Home Environment   Living Environment  Private residence    Living Arrangements  Alone    Available Help at Discharge  Friend(s)      Prior Function   Level of Independence  Independent    Vocation  Full time employment    Production manager    Leisure  She walks 2 miles each day (30-40 min)      Cognition   Overall Cognitive Status  Within Functional Limits for tasks assessed      Posture/Postural Control   Posture/Postural Control  Postural limitations    Postural Limitations  Rounded Shoulders;Forward head;Increased thoracic kyphosis      ROM / Strength   AROM / PROM / Strength  AROM;Strength      AROM   Overall AROM Comments  Cervical AROM is WNL    AROM Assessment Site  Shoulder    Right/Left Shoulder  Right;Left    Right Shoulder Extension  50 Degrees    Right Shoulder Flexion  163 Degrees    Right Shoulder ABduction  169 Degrees    Right Shoulder Internal Rotation  65 Degrees    Right Shoulder External Rotation  90 Degrees    Left Shoulder Extension  53 Degrees    Left Shoulder Flexion  149 Degrees    Left Shoulder ABduction  154 Degrees   Painful   Left Shoulder Internal Rotation  47 Degrees   Painful   Left Shoulder External Rotation  88 Degrees      Strength   Overall Strength  Within functional limits for tasks performed        LYMPHEDEMA/ONCOLOGY QUESTIONNAIRE - 10/28/18 0957      Type   Cancer Type  Left breast cancer      Lymphedema Assessments   Lymphedema Assessments  Upper extremities      Right Upper Extremity Lymphedema   10 cm Proximal to Olecranon Process  29.8 cm    Olecranon Process  26.1 cm    10 cm Proximal to Ulnar Styloid Process  22.8 cm    Just Proximal to Ulnar Styloid Process  15.3 cm     Across Hand at [employer]  18.8 cm    At [place] of 2nd Digit  6.4 cm      Left Upper Extremity Lymphedema   10 cm Proximal to Olecranon Process  30.1 cm    Olecranon Process  26 cm    10 cm Proximal to Ulnar Styloid Process  22.1 cm    Just Proximal to Ulnar Styloid Process  15.1 cm    Across Hand at [employer]  18.5 cm    At [place] of 2nd Digit  6.5 cm          Quick Dash - 10/28/18 0001    Open a tight or new jar  No difficulty    Do heavy household chores (wash walls, wash floors)  No difficulty    Carry a shopping bag or briefcase  No difficulty    Wash your back  No difficulty    Use a knife to cut food  No difficulty    Recreational activities in which you take some force or impact through your arm, shoulder, or hand (golf, hammering, tennis)  No difficulty    During the past week, to what extent has your arm, shoulder or hand problem interfered with your normal social activities with family, friends, neighbors, or groups?  Not at all    During the past week, to what extent has your arm, shoulder or hand problem limited your work or other regular daily activities  Not at all    Arm, shoulder, or hand pain.  None    Tingling (pins and needles) in your arm, shoulder, or hand  None    Difficulty Sleeping  No difficulty    DASH Score  0 %        Objective measurements completed on examination: See above findings.        Patient was instructed today in a home exercise program today for post op shoulder range of motion. These included active assist shoulder flexion in sitting, scapular retraction, wall walking with shoulder abduction, and hands behind head external rotation.  She was encouraged to do these twice a day, holding 3  seconds and repeating 5 times when permitted by her physician.          PT Education - 10/28/18 0958    Education Details  Lymphedema risk reduction and post op shoulder ROM HEP    Person(s) Educated  Patient    Methods   Explanation;Demonstration;Handout    Comprehension  Returned demonstration;Verbalized understanding          PT Long Term Goals - 10/08/18 0822      PT LONG TERM GOAL #3   Title  tolerate lifting 40 #    Baseline  40# today, able ot do but difficult    Status  Achieved      Breast Clinic Goals - 10/28/18 1002      Patient will be able to verbalize understanding of pertinent lymphedema risk reduction practices relevant to her diagnosis specifically related to skin care.   Time  1    Period  Days    Status  Achieved      Patient will be able to return demonstrate and/or verbalize understanding of the post-op home exercise program related to regaining shoulder range of motion.   Time  1    Period  Days    Status  Achieved      Patient will be able to verbalize understanding of the importance of attending the postoperative After Breast Cancer Class for further lymphedema risk reduction education and therapeutic exercise.   Time  1    Period  Days    Status  Achieved            Plan - 10/28/18 0959    Clinical Impression Statement  Patient was diagnosed on 10/13/2018 with left grade II invasive ductal carcinoma breast cancer. It measures 2.8 cm and is located in the upper inner quadrant. It is ER/PR negative and HER2 positive with a Ki67 of 50%. Her multidisciplinary medical team met prior to her assessments to determine a recommended treatment plan. She is planning to have neoadjuvant chemotherapy followed by a left lumpectomy and sentinel node biopsy followed by radiation. She will benefit from post op PT to regain shoulder ROM and function.    Stability/Clinical Decision Making  Stable/Uncomplicated    Clinical Decision Making  Low    Rehab Potential  Excellent    PT Frequency  One time visit    PT Treatment/Interventions  Therapeutic exercise;Patient/family education;ADLs/Self Care Home Management    PT Next Visit Plan  Will reassess post op if MD refers her    PT Home  Exercise Plan  Post op shoulder ROM HEP    Consulted and Agree with Plan of Care  Patient       Patient will benefit from skilled therapeutic intervention in order to improve the following deficits and impairments:  Decreased knowledge of precautions, Postural dysfunction, Decreased range of motion, Impaired UE functional use, Pain  Visit Diagnosis: Malignant neoplasm of upper-inner quadrant of left breast in female, estrogen receptor negative ([place]) - Plan: PT plan of care cert/re-cert  Abnormal posture - Plan: PT plan of care cert/re-cert   Patient will follow up at outpatient cancer rehab 3-4 weeks following surgery.  If the patient requires physical therapy at that time, a specific plan will be dictated and sent to the referring physician for approval. The patient was educated today on appropriate basic range of motion exercises to begin post operatively and the importance of attending the After Breast Cancer class following surgery.  Patient was educated today on lymphedema  risk reduction practices as it pertains to recommendations that will benefit the patient immediately following surgery.  She verbalized good understanding.     Problem List Patient Active Problem List   Diagnosis Date Noted  . Malignant neoplasm of upper-inner quadrant of left breast in female, estrogen receptor negative ([place]) 10/26/2018  . Vitamin D deficiency 12/12/2017  . Primary osteoarthritis of left knee 12/12/2017  . Chest pain 05/18/2012  . Mild anemia 05/18/2012  . DM type 2 (diabetes mellitus, type 2) ([place]) 05/18/2012  . Essential hypertension 04/06/2007  . KELOID 04/06/2007   Annia Friendly, PT 10/28/18 11:14 AM  [place], [place], [address] Phone: [phone]   Fax:  [phone]  Name: Erin Lawson MRN: [id number] Date of Birth: 1960-09-09

## 2018-10-28 NOTE — Progress Notes (Signed)
START ON PATHWAY REGIMEN - Breast     A cycle is every 21 days:     Pertuzumab      Pertuzumab      Trastuzumab-xxxx      Trastuzumab-xxxx      Carboplatin      Docetaxel   **Always confirm dose/schedule in your pharmacy ordering system**  Patient Characteristics: Preoperative or Nonsurgical Candidate (Clinical Staging), Neoadjuvant Therapy followed by Surgery, Invasive Disease, Chemotherapy, HER2 Positive, ER Negative/Unknown Therapeutic Status: Preoperative or Nonsurgical Candidate (Clinical Staging) AJCC M Category: cM0 AJCC Grade: G3 Breast Surgical Plan: Neoadjuvant Therapy followed by Surgery ER Status: Negative (-) AJCC 8 Stage Grouping: IIA HER2 Status: Positive (+) AJCC T Category: cT2 AJCC N Category: cN0 PR Status: Negative (-) Intent of Therapy: Curative Intent, Discussed with Patient

## 2018-10-28 NOTE — H&P (Signed)
Forrest Moron Documented: 10/28/2018 7:29 AM Location: [facility] Patient #: [id number] DOB: [birth year] Undefined / Language: Cleophus Molt / Race: Black or African American Female  History of Present Illness [name] A. Carmelita Amparo MD; 10/28/2018 12:43 PM) Patient words: Pt sent at the request of the [facility] for abnormal left breast mammogram and left breast mass. 2.8 cm left upper inner quadrant mass cor bx IDC grade 2 er neg pr neg her 2 neu positive.  No other complaints          ADDITIONAL INFORMATION: 1. PROGNOSTIC INDICATORS Results: IMMUNOHISTOCHEMICAL AND MORPHOMETRIC ANALYSIS PERFORMED MANUALLY The tumor cells are POSITIVE for Her2 (3+). Estrogen Receptor: 0%, NEGATIVE Progesterone Receptor: 0%, NEGATIVE Proliferation Marker Ki67: 50% COMMENT: The negative hormone receptor study(ies) in this case has an internal positive control. REFERENCE RANGE ESTROGEN RECEPTOR NEGATIVE 0% POSITIVE =>1% REFERENCE RANGE PROGESTERONE RECEPTOR NEGATIVE 0% POSITIVE =>1% All controls stained appropriately Enid Cutter MD Pathologist, Electronic Signature ( Signed 10/23/2018) FINAL DIAGNOSIS Diagnosis 1. Breast, left, needle core biopsy, upper inner, 11:30 o'clock - INVASIVE DUCTAL CARCINOMA, GRADE II. - DUCTAL CARCINOMA IN SITU. 1 of 3 FINAL for [name], [name] [phone]) Diagnosis(continued) - SEE MICROSCOPIC DESCRIPTION. 2. Lymph node, needle/core biopsy, left axilla - LYMPH NODE TISSUE WITH NO METASTATIC CARCINOMA. Microscopic Comment 1. A breast prognostic profile will be performed. Dr. Lyndon Code agrees. Called to the [facility] on 10/22/2018. (JDP:kh 10/22/2018) Claudette Laws MD Pathologist, Electronic Signature (Case signed 10/22/2018) Specimen Gross and Clinical Information Specimen Comment               ADDENDUM REPORT: 10/22/2018 12:52 ADDENDUM: There is an error in the FINDINGS and IMPRESSION of the prior report, however the  RECOMMENDATION and BI-RADS CATEGORY are unchanged. The FINDINGS section should include the sentence: "No suspicious mass, microcalcification, or other finding is identified in the right breast." The IMPRESSION of the report should be as follows: 1. Palpable irregular mass in the left breast is suspicious for malignancy. Two mildly thickened left axillary lymph nodes are suspicious for metastatic disease. 2. No evidence of malignancy in the right breast. Electronically Signed By: Zerita Boers M.D. On: 10/22/2018 12:52 Addended by Wilford Corner, MD on 10/22/2018 2:52 PM  Study Result CLINICAL DATA: Palpable area of concern in the left breast for approximately 5 weeks. EXAM: DIGITAL DIAGNOSTIC BILATERAL MAMMOGRAM WITH CAD AND TOMO ULTRASOUND LEFT BREAST COMPARISON: None ACR Breast Density Category c: The breast tissue is heterogeneously dense, which may obscure small masses. FINDINGS: An irregular mass is seen in the upper inner left breast at anterior depth measuring 2.8 cm. There are associated regional fine pleomorphic microcalcifications which span at 3.2 cm in greatest dimension. An overlying palpable skin marker is identified. A circumscribed mass is seen in the upper outer left breast measuring 0.5 cm. Mammographic images were processed with CAD. Targeted left breast ultrasound was performed. At 11:30 o'clock 2 cm from the nipple an irregular hypoechoic mass measuring 2.8 x 1.5 by 1.9 cm is seen. This corresponds to the mass seen on mammogram and the palpable area of concern. At 2 o'clock 3 cm from nipple a normal-appearing intramammary lymph node is identified. This corresponds to the circumscribed mass seen in the upper outer left breast. Two left axillary lymph nodes demonstrate mild cortical thickening up to 0.4 cm. IMPRESSION: Palpable irregular mass in the left breast is suspicious for malignancy. Two mildly thickened left axillary lymph nodes are  suspicious for metastatic disease. RECOMMENDATION: Recommend ultrasound-guided biopsy of the irregular  mass and one of the two mildly thickened left axillary lymph nodes. I have discussed the findings and recommendations with the patient. If applicable, a reminder letter will be sent to the patient regarding the next appointment. BI-RADS CATEGORY 4: Suspicious. Electronically Signed: By: Tyler Litton M.D. On: 10/13/2018 15:34.  The patient is a 58 year old female.   Past Surgical History (Sylvia Ledford, RN; 10/28/2018 7:29 AM) Cesarean Section - 1  Diagnostic Studies History (Sylvia Ledford, RN; 10/28/2018 7:29 AM) Colonoscopy 1-5 years ago Mammogram within last year Pap Smear >5 years ago  Medication History (Sylvia Ledford, RN; 10/28/2018 7:29 AM) Medications Reconciled  Pregnancy / Birth History (Sylvia Ledford, RN; 10/28/2018 7:29 AM) Age at menarche 9 years. Age of menopause 51-55 Contraceptive History Oral contraceptives. Gravida 3 Length (months) of breastfeeding 3-6 Maternal age 15-20 Para 1  Other Problems (Sylvia Ledford, RN; 10/28/2018 7:29 AM) Gastroesophageal Reflux Disease High blood pressure Hypercholesterolemia Lump In Breast     Review of Systems (Shaletta Hinostroza A. Brittinee Risk MD; 10/28/2018 12:43 PM) General Not Present- Appetite Loss, Chills, Fatigue, Fever, Night Sweats, Weight Gain and Weight Loss. Skin Not Present- Change in Wart/Mole, Dryness, Hives, Jaundice, New Lesions, Non-Healing Wounds, Rash and Ulcer. Respiratory Not Present- Bloody sputum, Chronic Cough, Difficulty Breathing, Snoring and Wheezing. Breast Present- Breast Pain. Not Present- Breast Mass, Nipple Discharge and Skin Changes. Gastrointestinal Not Present- Abdominal Pain, Bloating, Bloody Stool, Change in Bowel Habits, Chronic diarrhea, Constipation, Difficulty Swallowing, Excessive gas, Gets full quickly at meals, Hemorrhoids, Indigestion, Nausea, Rectal Pain and  Vomiting. Female Genitourinary Not Present- Frequency, Nocturia, Painful Urination, Pelvic Pain and Urgency. Musculoskeletal Not Present- Back Pain, Joint Pain, Joint Stiffness, Muscle Pain, Muscle Weakness and Swelling of Extremities. Neurological Not Present- Decreased Memory, Fainting, Headaches, Numbness, Seizures, Tingling, Tremor, Trouble walking and Weakness. Psychiatric Not Present- Anxiety, Bipolar, Change in Sleep Pattern, Depression, Fearful and Frequent crying. Endocrine Present- New Diabetes. Not Present- Cold Intolerance, Excessive Hunger, Hair Changes, Heat Intolerance and Hot flashes. Hematology Not Present- Blood Thinners, Easy Bruising, Excessive bleeding, Gland problems, HIV and Persistent Infections. All other systems negative   Physical Exam (Abrey Bradway A. Nica Friske MD; 10/28/2018 12:44 PM)  General Mental Status-Alert. General Appearance-Consistent with stated age. Hydration-Well hydrated. Voice-Normal.  Head and Neck Head-normocephalic, atraumatic with no lesions or palpable masses. Trachea-midline. Thyroid Gland Characteristics - normal size and consistency.  Chest and Lung Exam Chest and lung exam reveals -quiet, even and easy respiratory effort with no use of accessory muscles and on auscultation, normal breath sounds, no adventitious sounds and normal vocal resonance. Inspection Chest Wall - Normal. Back - normal.  Breast Note: left breast bruising 2 cm upper inner quadrant right breast normal  Cardiovascular Cardiovascular examination reveals -normal heart sounds, regular rate and rhythm with no murmurs and normal pedal pulses bilaterally.  Neurologic Neurologic evaluation reveals -alert and oriented x 3 with no impairment of recent or remote memory. Mental Status-Normal.  Musculoskeletal Normal Exam - Left-Upper Extremity Strength Normal and Lower Extremity Strength Normal. Normal Exam - Right-Upper Extremity Strength Normal  and Lower Extremity Strength Normal.  Lymphatic Head & Neck  General Head & Neck Lymphatics: Bilateral - Description - Normal. Axillary  General Axillary Region: Bilateral - Description - Normal. Tenderness - Non Tender. Femoral & Inguinal  Generalized Femoral & Inguinal Lymphatics: Bilateral - Description - Normal. Tenderness - Non Tender.    Assessment & Plan (Amogh Komatsu A. Zenna Traister MD; 10/28/2018 12:46 PM)  BREAST CANCER, LEFT (C50.912) Impression: neoadjuvant chemotherapy place port breast conservation   to follow Pt requires port placement for chemotherapy. Risk include bleeding, infection, pneumothorax, hemothorax, mediastinal injury, nerve injury , blood vessel injury, stroke, blood clots, death, migration. embolization and need for additional procedures. Pt agrees to proceed.  Current Plans You are being scheduled for surgery- Our schedulers will call you.  You should hear from our office's scheduling department within 5 working days about the location, date, and time of surgery. We try to make accommodations for patient's preferences in scheduling surgery, but sometimes the OR schedule or the surgeon's schedule prevents [name] from making those accommodations.  If you have not heard from our office [phone]) in 5 working days, call the office and ask for your surgeon's nurse.  If you have other questions about your diagnosis, plan, or surgery, call the office and ask for your surgeon's nurse.  Pt Education - [facility] Breast Cancer Information Given - Alight "Breast Journey" Package Use of a central venous catheter for intravenous therapy was discussed. Technique of catheter placement using ultrasound and fluoroscopy guidance was discussed. Risks such as bleeding, infection, pneumothorax, catheter occlusion, reoperation, and other risks were discussed. I noted a good likelihood this will help address the problem. Questions were answered. The patient expressed understanding &  wishes to proceed.

## 2018-10-28 NOTE — H&P (View-Only) (Signed)
Erin Lawson Documented: 10/28/2018 7:29 AM Location: [facility] Patient #: [id number] DOB: [birth year] Undefined / Language: Erin Lawson / Race: Black or African American Female  History of Present Illness Erin Lawson A. Erin Daris MD; 10/28/2018 12:43 PM) Patient words: Pt sent at the request of the [facility] for abnormal left breast mammogram and left breast mass. 2.8 cm left upper inner quadrant mass cor bx IDC grade 2 er neg pr neg her 2 neu positive.  No other complaints          ADDITIONAL INFORMATION: 1. PROGNOSTIC INDICATORS Results: IMMUNOHISTOCHEMICAL AND MORPHOMETRIC ANALYSIS PERFORMED MANUALLY The tumor cells are POSITIVE for Her2 (3+). Estrogen Receptor: 0%, NEGATIVE Progesterone Receptor: 0%, NEGATIVE Proliferation Marker Ki67: 50% COMMENT: The negative hormone receptor study(ies) in this case has an internal positive control. REFERENCE RANGE ESTROGEN RECEPTOR NEGATIVE 0% POSITIVE =>1% REFERENCE RANGE PROGESTERONE RECEPTOR NEGATIVE 0% POSITIVE =>1% All controls stained appropriately Erin Cutter MD Pathologist, Electronic Signature ( Signed 10/23/2018) FINAL DIAGNOSIS Diagnosis 1. Breast, left, needle core biopsy, upper inner, 11:30 o'clock - INVASIVE DUCTAL CARCINOMA, GRADE II. - DUCTAL CARCINOMA IN SITU. 1 of 3 FINAL for Erin Lawson [phone]) Diagnosis(continued) - SEE MICROSCOPIC DESCRIPTION. 2. Lymph node, needle/core biopsy, left axilla - LYMPH NODE TISSUE WITH NO METASTATIC CARCINOMA. Microscopic Comment 1. A breast prognostic profile will be performed. Dr. Lyndon Lawson agrees. Called to the [facility] on 10/22/2018. (Erin Lawson:kh 10/22/2018) Erin Laws MD Pathologist, Electronic Signature (Case signed 10/22/2018) Specimen Gross and Clinical Information Specimen Comment               ADDENDUM REPORT: 10/22/2018 12:52 ADDENDUM: There is an error in the FINDINGS and IMPRESSION of the prior report, however the  RECOMMENDATION and BI-RADS CATEGORY are unchanged. The FINDINGS section should include the sentence: "No suspicious mass, microcalcification, or other finding is identified in the right breast." The IMPRESSION of the report should be as follows: 1. Palpable irregular mass in the left breast is suspicious for malignancy. Two mildly thickened left axillary lymph nodes are suspicious for metastatic disease. 2. No evidence of malignancy in the right breast. Electronically Signed By: Erin Lawson M.D. On: 10/22/2018 12:52 Addended by Erin Corner, MD on 10/22/2018 2:52 PM  Study Result CLINICAL DATA: Palpable area of concern in the left breast for approximately 5 weeks. EXAM: DIGITAL DIAGNOSTIC BILATERAL MAMMOGRAM WITH CAD AND TOMO ULTRASOUND LEFT BREAST COMPARISON: None ACR Breast Density Category c: The breast tissue is heterogeneously dense, which may obscure small masses. FINDINGS: An irregular mass is seen in the upper inner left breast at anterior depth measuring 2.8 cm. There are associated regional fine pleomorphic microcalcifications which span at 3.2 cm in greatest dimension. An overlying palpable skin marker is identified. A circumscribed mass is seen in the upper outer left breast measuring 0.5 cm. Mammographic images were processed with CAD. Targeted left breast ultrasound was performed. At 11:30 o'clock 2 cm from the nipple an irregular hypoechoic mass measuring 2.8 x 1.5 by 1.9 cm is seen. This corresponds to the mass seen on mammogram and the palpable area of concern. At 2 o'clock 3 cm from nipple a normal-appearing intramammary lymph node is identified. This corresponds to the circumscribed mass seen in the upper outer left breast. Two left axillary lymph nodes demonstrate mild cortical thickening up to 0.4 cm. IMPRESSION: Palpable irregular mass in the left breast is suspicious for malignancy. Two mildly thickened left axillary lymph nodes are  suspicious for metastatic disease. RECOMMENDATION: Recommend ultrasound-guided biopsy of the irregular  mass and one of the two mildly thickened left axillary lymph nodes. I have discussed the findings and recommendations with the patient. If applicable, a reminder letter will be sent to the patient regarding the next appointment. BI-RADS CATEGORY 4: Suspicious. Electronically Signed: By: Erin Lawson M.D. On: 10/13/2018 15:34.  The patient is a 58 year old female.   Past Surgical History Erin Pummel, RN; 10/28/2018 7:29 AM) Cesarean Section - 1  Diagnostic Studies History Erin Pummel, RN; 10/28/2018 7:29 AM) Colonoscopy 1-5 years ago Mammogram within last year Pap Smear >5 years ago  Medication History Erin Pummel, RN; 10/28/2018 7:29 AM) Medications Reconciled  Pregnancy / Birth History Erin Pummel, RN; 10/28/2018 7:29 AM) Age at menarche 65 years. Age of menopause 51-55 Contraceptive History Oral contraceptives. Gravida 3 Length (months) of breastfeeding 3-6 Maternal age 70-20 Para 1  Other Problems Erin Pummel, RN; 10/28/2018 7:29 AM) Gastroesophageal Reflux Disease High blood pressure Hypercholesterolemia Lump In Breast     Review of Systems (Erin Vallery A. Vincent Streater MD; 10/28/2018 12:43 PM) General Not Present- Appetite Loss, Chills, Fatigue, Fever, Night Sweats, Weight Gain and Weight Loss. Skin Not Present- Change in Wart/Mole, Dryness, Hives, Jaundice, New Lesions, Non-Healing Wounds, Rash and Ulcer. Respiratory Not Present- Bloody sputum, Chronic Cough, Difficulty Breathing, Snoring and Wheezing. Breast Present- Breast Pain. Not Present- Breast Mass, Nipple Discharge and Skin Changes. Gastrointestinal Not Present- Abdominal Pain, Bloating, Bloody Stool, Change in Bowel Habits, Chronic diarrhea, Constipation, Difficulty Swallowing, Excessive gas, Gets full quickly at meals, Hemorrhoids, Indigestion, Nausea, Rectal Pain and  Vomiting. Female Genitourinary Not Present- Frequency, Nocturia, Painful Urination, Pelvic Pain and Urgency. Musculoskeletal Not Present- Back Pain, Joint Pain, Joint Stiffness, Muscle Pain, Muscle Weakness and Swelling of Extremities. Neurological Not Present- Decreased Memory, Fainting, Headaches, Numbness, Seizures, Tingling, Tremor, Trouble walking and Weakness. Psychiatric Not Present- Anxiety, Bipolar, Change in Sleep Pattern, Depression, Fearful and Frequent crying. Endocrine Present- New Diabetes. Not Present- Cold Intolerance, Excessive Hunger, Hair Changes, Heat Intolerance and Hot flashes. Hematology Not Present- Blood Thinners, Easy Bruising, Excessive bleeding, Gland problems, HIV and Persistent Infections. All other systems negative   Physical Exam (Taras Rask A. Merina Behrendt MD; 10/28/2018 12:44 PM)  General Mental Status-Alert. General Appearance-Consistent with stated age. Hydration-Well hydrated. Voice-Normal.  Head and Neck Head-normocephalic, atraumatic with no lesions or palpable masses. Trachea-midline. Thyroid Gland Characteristics - normal size and consistency.  Chest and Lung Exam Chest and lung exam reveals -quiet, even and easy respiratory effort with no use of accessory muscles and on auscultation, normal breath sounds, no adventitious sounds and normal vocal resonance. Inspection Chest Wall - Normal. Back - normal.  Breast Note: left breast bruising 2 cm upper inner quadrant right breast normal  Cardiovascular Cardiovascular examination reveals -normal heart sounds, regular rate and rhythm with no murmurs and normal pedal pulses bilaterally.  Neurologic Neurologic evaluation reveals -alert and oriented x 3 with no impairment of recent or remote memory. Mental Status-Normal.  Musculoskeletal Normal Exam - Left-Upper Extremity Strength Normal and Lower Extremity Strength Normal. Normal Exam - Right-Upper Extremity Strength Normal  and Lower Extremity Strength Normal.  Lymphatic Head & Neck  General Head & Neck Lymphatics: Bilateral - Description - Normal. Axillary  General Axillary Region: Bilateral - Description - Normal. Tenderness - Non Tender. Femoral & Inguinal  Generalized Femoral & Inguinal Lymphatics: Bilateral - Description - Normal. Tenderness - Non Tender.    Assessment & Plan (Agripina Guyette A. Walterine Amodei MD; 10/28/2018 12:46 PM)  BREAST CANCER, LEFT (C50.912) Impression: neoadjuvant chemotherapy place port breast conservation  to follow Pt requires port placement for chemotherapy. Risk include bleeding, infection, pneumothorax, hemothorax, mediastinal injury, nerve injury , blood vessel injury, stroke, blood clots, death, migration. embolization and need for additional procedures. Pt agrees to proceed.  Current Plans You are being scheduled for surgery- Our schedulers will call you.  You should hear from our office's scheduling department within 5 working days about the location, date, and time of surgery. We try to make accommodations for patient's preferences in scheduling surgery, but sometimes the OR schedule or the surgeon's schedule prevents [name] from making those accommodations.  If you have not heard from our office [phone]) in 5 working days, call the office and ask for your surgeon's nurse.  If you have other questions about your diagnosis, plan, or surgery, call the office and ask for your surgeon's nurse.  Pt Education - [facility] Breast Cancer Information Given - Alight "Breast Journey" Package Use of a central venous catheter for intravenous therapy was discussed. Technique of catheter placement using ultrasound and fluoroscopy guidance was discussed. Risks such as bleeding, infection, pneumothorax, catheter occlusion, reoperation, and other risks were discussed. I noted a good likelihood this will help address the problem. Questions were answered. The patient expressed understanding &  wishes to proceed.

## 2018-10-29 ENCOUNTER — Telehealth: Payer: Self-pay | Admitting: *Deleted

## 2018-10-29 ENCOUNTER — Other Ambulatory Visit: Payer: Self-pay | Admitting: Hematology and Oncology

## 2018-10-29 ENCOUNTER — Other Ambulatory Visit: Payer: Self-pay | Admitting: *Deleted

## 2018-10-29 DIAGNOSIS — C50212 Malignant neoplasm of upper-inner quadrant of left female breast: Secondary | ICD-10-CM

## 2018-10-29 DIAGNOSIS — Z171 Estrogen receptor negative status [ER-]: Secondary | ICD-10-CM

## 2018-10-29 NOTE — Telephone Encounter (Signed)
Spoke with patient to discuss her MRI and the need for additional imaging/biopsies. Informed her someone from [facility] will be calling to schedule these.

## 2018-10-29 NOTE — Telephone Encounter (Signed)
Left message for a return phone call to discuss the need for additional biopsies.

## 2018-10-30 ENCOUNTER — Telehealth: Payer: Self-pay | Admitting: *Deleted

## 2018-10-30 NOTE — Telephone Encounter (Signed)
Received call from patient inquiring about laser surgery for her breast cancer.  Informed her that I did not know much about it but did know that it was only performed for tumors 2cm or less and I wasn't sure where in the country these are being performed, but this was not our standard of care.  Her tumor is >2cm.  She states she plans on receiving treatment here but is getting a second opinion at [facility].

## 2018-11-02 ENCOUNTER — Telehealth: Payer: Self-pay | Admitting: Family Medicine

## 2018-11-02 ENCOUNTER — Ambulatory Visit: Payer: Self-pay | Admitting: *Deleted

## 2018-11-02 ENCOUNTER — Telehealth: Payer: Self-pay | Admitting: *Deleted

## 2018-11-02 NOTE — Telephone Encounter (Signed)
Pt called regarding a feeling of paralysis in her left arm 30 mins after having an MRI last week. Also so a little dizzy at that time. They injected her right arm with a dye for the MRI.  She stated that it was like that on Friday and she kept exercising her arm and got to the place that she can now use her arm.  She also used icy hot .  No pain. She thinks she has some swelling around the left side of her neck.  Also when she tries to take a deep breath, it feels like she can't get air in all the way. No shortness of breath. But like she may have an infection in her lungs.  She denies having covid-19. She gets a covid test every Tuesday for her job and has been negative.  She is requesting an appointment for tomorrow. She is for a breast biopsy on Wednesday and chemo on Thursday. Advised to also notify [facility] to find out the dye she was given but also to notify the surgeon and oncologist regarding how she is feeling also. Routing to Primary Care at [facility] for review and an appointment. Requesting a call back today.   Reason for Disposition . Nursing judgment or information in reference  Answer Assessment - Initial Assessment Questions 1. SYMPTOM: "What is the main symptom you are concerned about?" (e.g., weakness, numbness)     Pt stated she had paralysis of her left arm following an MRI 2. ONSET: "When did this start?" (minutes, hours, days; while sleeping)     About 30 mins after she had the MRI 3. LAST NORMAL: "When was the last time you were normal (no symptoms)?"     It is back to normal now 4. PATTERN "Does this come and go, or has it been constant since it started?"  "Is it present now?"     Not now 5. CARDIAC SYMPTOMS: "Have you had any of the following symptoms: chest pain, difficulty breathing, palpitations?"     Feels like she cant fully take a deep breath 6. NEUROLOGIC SYMPTOMS: "Have you had any of the following symptoms: headache, dizziness, vision loss, double  vision, changes in speech, unsteady on your feet?"     no 7. OTHER SYMPTOMS: "Do you have any other symptoms?"     no 8. PREGNANCY: "Is there any chance you are pregnant?" "When was your last menstrual period?"     n/a  Protocols used: NO GUIDELINE AVAILABLE-A-AH, NEUROLOGIC DEFICIT-A-AH

## 2018-11-02 NOTE — Telephone Encounter (Signed)
Copied from [place] [id number]. Topic: General - Other >> Nov 02, 2018 11:55 AM Alanda Slim E wrote: Reason for CRM: Pt had MRI last week and when she got home her left arm was not able to move and it seemed to be paralyzed and she had no use of her arm and this went on the whole weekend but as of today she has movement of the arm/ Pt was told to advise her PCP

## 2018-11-02 NOTE — Telephone Encounter (Signed)
Spoke with patient regarding her biopsies.  Patient states her arm was hurting since her MRI and it felt like muscle pain, but that it is better now.  She now is complaining of back pain and a feeling like she has an infection.  She also states that when she tries to take a deep breath she doesn't feel like her lungs are filling up.  Denies chest pain, just middle back pain.  She is not bothered when she is breathing normally. She states "I could feel the dye going all over my body." Informed her I would get a message to Dr. Lindi Adie for advice. Patient verbalized understanding.

## 2018-11-02 NOTE — Telephone Encounter (Signed)
Received call from pt stating since her MRI on 10/14 she has been experiencing back pain and unable to take a full deep breath.  Reviewed with MD and per Dr. Lindi Adie, the back pain is related to the pt laying on the MRI table and for the pt to take Ibuprofen and apply warm compresses as needed.  Dr. Lindi Adie also advised for the pt to take ativan to help her with the full deep breaths, this may be related to anxiety.  RN reviewed with pt and pt verbalized understanding.

## 2018-11-03 NOTE — Telephone Encounter (Signed)
FYI

## 2018-11-04 ENCOUNTER — Ambulatory Visit
Admission: RE | Admit: 2018-11-04 | Discharge: 2018-11-04 | Disposition: A | Discharge status: Home / Self Care | Payer: 59 | Source: Ambulatory Visit | Attending: Hematology and Oncology | Admitting: Hematology and Oncology

## 2018-11-04 ENCOUNTER — Other Ambulatory Visit: Payer: Self-pay | Admitting: Hematology and Oncology

## 2018-11-04 ENCOUNTER — Other Ambulatory Visit: Payer: Self-pay

## 2018-11-04 DIAGNOSIS — Z171 Estrogen receptor negative status [ER-]: Secondary | ICD-10-CM

## 2018-11-04 DIAGNOSIS — C50212 Malignant neoplasm of upper-inner quadrant of left female breast: Secondary | ICD-10-CM

## 2018-11-04 DIAGNOSIS — R9389 Abnormal findings on diagnostic imaging of other specified body structures: Secondary | ICD-10-CM

## 2018-11-05 ENCOUNTER — Telehealth: Payer: Self-pay | Admitting: *Deleted

## 2018-11-05 ENCOUNTER — Ambulatory Visit (HOSPITAL_COMMUNITY)
Admission: RE | Admit: 2018-11-05 | Discharge: 2018-11-05 | Disposition: A | Discharge status: Home / Self Care | Payer: 59 | Source: Ambulatory Visit | Attending: Hematology and Oncology | Admitting: Hematology and Oncology

## 2018-11-05 ENCOUNTER — Encounter: Payer: Self-pay | Admitting: General Practice

## 2018-11-05 ENCOUNTER — Inpatient Hospital Stay: Payer: 59

## 2018-11-05 ENCOUNTER — Other Ambulatory Visit: Payer: Self-pay

## 2018-11-05 DIAGNOSIS — Z171 Estrogen receptor negative status [ER-]: Secondary | ICD-10-CM

## 2018-11-05 DIAGNOSIS — C50212 Malignant neoplasm of upper-inner quadrant of left female breast: Secondary | ICD-10-CM | POA: Diagnosis not present

## 2018-11-05 DIAGNOSIS — I340 Nonrheumatic mitral (valve) insufficiency: Secondary | ICD-10-CM | POA: Insufficient documentation

## 2018-11-05 NOTE — Progress Notes (Signed)
[place] Spiritual Care Note  Reached [name] by phone per referral from chemo educator Casandra Doffing. [name] used the opportunity well to share and process about grief related to lack of church support during the stress of dx and anticipating tx. Provided empathic listening, pastoral reflection, normalization of feelings, and prayer. Pt verbalized gratitude, naming relief at the opportunity for emotional release. We plan to f/u by phone next week.   [place], [place], [facility] Pager [phone] Voicemail [phone]

## 2018-11-05 NOTE — Telephone Encounter (Signed)
Left message for a return phone call regarding her chemo schedule.

## 2018-11-05 NOTE — Progress Notes (Signed)
  Echocardiogram 2D Echocardiogram has been performed.  Erin Lawson 11/05/2018, 11:36 AM

## 2018-11-05 NOTE — Telephone Encounter (Signed)
Spoke with patient regarding her chemo schedule.  She stated no one called her with the appointments.  Discussed all of her appointments and patient verbalized understanding.  Encouraged her to call with any other questions or concerns she may have.

## 2018-11-06 ENCOUNTER — Encounter (HOSPITAL_BASED_OUTPATIENT_CLINIC_OR_DEPARTMENT_OTHER): Payer: Self-pay | Admitting: *Deleted

## 2018-11-06 ENCOUNTER — Other Ambulatory Visit: Payer: Self-pay

## 2018-11-07 ENCOUNTER — Other Ambulatory Visit (HOSPITAL_COMMUNITY)
Admission: RE | Admit: 2018-11-07 | Discharge: 2018-11-07 | Disposition: A | Discharge status: Home / Self Care | Payer: 59 | Source: Ambulatory Visit | Attending: Surgery | Admitting: Surgery

## 2018-11-07 DIAGNOSIS — Z20828 Contact with and (suspected) exposure to other viral communicable diseases: Secondary | ICD-10-CM | POA: Diagnosis not present

## 2018-11-07 DIAGNOSIS — Z01812 Encounter for preprocedural laboratory examination: Secondary | ICD-10-CM | POA: Diagnosis not present

## 2018-11-08 LAB — NOVEL CORONAVIRUS, NAA (HOSP ORDER, SEND-OUT TO REF LAB; TAT 18-24 HRS): SARS-CoV-2, NAA: NOT DETECTED

## 2018-11-09 ENCOUNTER — Telehealth: Payer: Self-pay

## 2018-11-09 ENCOUNTER — Encounter (HOSPITAL_BASED_OUTPATIENT_CLINIC_OR_DEPARTMENT_OTHER)
Admission: RE | Admit: 2018-11-09 | Discharge: 2018-11-09 | Disposition: A | Discharge status: Home / Self Care | Payer: 59 | Source: Ambulatory Visit | Attending: Surgery | Admitting: Surgery

## 2018-11-09 NOTE — Progress Notes (Signed)
      Enhanced Recovery after Surgery  Enhanced Recovery after Surgery is a protocol used to improve the stress on your body and your recovery after surgery.  Patient Instructions  . The night before surgery:  o No food after midnight. ONLY clear liquids after midnight  . The day of surgery (if you do NOT have diabetes):  o Drink ONE (1) Pre-Surgery Clear Ensure as directed.   o This drink was given to you during your hospital  pre-op appointment visit. o The pre-op nurse will instruct you on the time to drink the  Pre-Surgery Ensure depending on your surgery time. o Finish the drink at the designated time by the pre-op nurse.  o Nothing else to drink after completing the  Pre-Surgery Clear Ensure.  . The day of surgery (if you have diabetes): o Drink ONE (1) Gatorade 2 (G2) as directed. o This drink was given to you during your hospital  pre-op appointment visit.  o The pre-op nurse will instruct you on the time to drink the   Gatorade 2 (G2) depending on your surgery time. o Color of the Gatorade may vary. Red is not allowed. o Nothing else to drink after completing the  Gatorade 2 (G2).         If you have questions, please contact your surgeon's office.  Surgical soap also given to patient with instructions for use.  Patient verbalized understanding of instructions. 

## 2018-11-09 NOTE — Telephone Encounter (Signed)
Pt called asking if we had received her FMLA paper work that was faxed to [name] last week.  There is no documentation of FMLA paperwork being received by this office.  Gave pt fax number for this office [phone].  Pt states she will have paperwork faxed to this number.

## 2018-11-09 NOTE — Progress Notes (Signed)
Pt seen for PAT appt. EKG completed and reviewed with Dr. Roanna Banning. Will proceed with scheduled procedure.

## 2018-11-11 ENCOUNTER — Encounter (HOSPITAL_BASED_OUTPATIENT_CLINIC_OR_DEPARTMENT_OTHER)
Admission: RE | Disposition: A | Discharge status: Home / Self Care | Payer: Self-pay | Source: Home / Self Care | Attending: Surgery

## 2018-11-11 ENCOUNTER — Ambulatory Visit (HOSPITAL_BASED_OUTPATIENT_CLINIC_OR_DEPARTMENT_OTHER): Payer: 59 | Admitting: Anesthesiology

## 2018-11-11 ENCOUNTER — Encounter: Payer: Self-pay | Admitting: General Practice

## 2018-11-11 ENCOUNTER — Ambulatory Visit (HOSPITAL_BASED_OUTPATIENT_CLINIC_OR_DEPARTMENT_OTHER)
Admission: RE | Admit: 2018-11-11 | Discharge: 2018-11-11 | Disposition: A | Discharge status: Home / Self Care | Payer: 59 | Attending: Surgery | Admitting: Surgery

## 2018-11-11 ENCOUNTER — Encounter (HOSPITAL_BASED_OUTPATIENT_CLINIC_OR_DEPARTMENT_OTHER): Payer: Self-pay | Admitting: *Deleted

## 2018-11-11 ENCOUNTER — Ambulatory Visit (HOSPITAL_COMMUNITY): Payer: 59

## 2018-11-11 ENCOUNTER — Other Ambulatory Visit: Payer: Self-pay

## 2018-11-11 DIAGNOSIS — Z95828 Presence of other vascular implants and grafts: Secondary | ICD-10-CM

## 2018-11-11 DIAGNOSIS — I10 Essential (primary) hypertension: Secondary | ICD-10-CM | POA: Diagnosis not present

## 2018-11-11 DIAGNOSIS — Z171 Estrogen receptor negative status [ER-]: Secondary | ICD-10-CM | POA: Diagnosis not present

## 2018-11-11 DIAGNOSIS — C50212 Malignant neoplasm of upper-inner quadrant of left female breast: Secondary | ICD-10-CM | POA: Insufficient documentation

## 2018-11-11 DIAGNOSIS — Z419 Encounter for procedure for purposes other than remedying health state, unspecified: Secondary | ICD-10-CM

## 2018-11-11 HISTORY — DX: Anemia, unspecified: D64.9

## 2018-11-11 HISTORY — PX: PORTACATH PLACEMENT: SHX2246

## 2018-11-11 HISTORY — DX: Anxiety disorder, unspecified: F41.9

## 2018-11-11 HISTORY — DX: Gastro-esophageal reflux disease without esophagitis: K21.9

## 2018-11-11 LAB — GLUCOSE, CAPILLARY
Glucose-Capillary: 131 mg/dL — ABNORMAL HIGH (ref 70–99)
Glucose-Capillary: 123 mg/dL — ABNORMAL HIGH (ref 70–99)

## 2018-11-11 SURGERY — INSERTION, TUNNELED CENTRAL VENOUS DEVICE, WITH PORT
Anesthesia: General | Site: Chest | Laterality: Right

## 2018-11-11 MED ORDER — LACTATED RINGERS IV SOLN
INTRAVENOUS | Status: DC
  Administered 2018-11-11 (×2): via INTRAVENOUS

## 2018-11-11 MED ORDER — MIDAZOLAM HCL 2 MG/2ML IJ SOLN
INTRAMUSCULAR | Status: AC
  Filled 2018-11-11: qty 2

## 2018-11-11 MED ORDER — MIDAZOLAM HCL 2 MG/2ML IJ SOLN
INTRAMUSCULAR | Status: DC | PRN
  Administered 2018-11-11: 2 mg via INTRAVENOUS

## 2018-11-11 MED ORDER — DEXTROSE 5 % IV SOLN: 3.0000 g | INTRAVENOUS | Status: DC

## 2018-11-11 MED ORDER — DEXAMETHASONE SODIUM PHOSPHATE 10 MG/ML IJ SOLN
INTRAMUSCULAR | Status: AC
  Filled 2018-11-11: qty 1

## 2018-11-11 MED ORDER — HEPARIN SOD (PORK) LOCK FLUSH 100 UNIT/ML IV SOLN
INTRAVENOUS | Status: DC | PRN
  Administered 2018-11-11: 500 [IU] via INTRAVENOUS

## 2018-11-11 MED ORDER — GABAPENTIN 300 MG PO CAPS
300.0000 mg | ORAL_CAPSULE | ORAL | Status: AC
  Administered 2018-11-11: 300 mg via ORAL

## 2018-11-11 MED ORDER — LIDOCAINE HCL (CARDIAC) PF 100 MG/5ML IV SOSY
PREFILLED_SYRINGE | INTRAVENOUS | Status: DC | PRN
  Administered 2018-11-11: 100 mg via INTRATRACHEAL

## 2018-11-11 MED ORDER — GABAPENTIN 300 MG PO CAPS
ORAL_CAPSULE | ORAL | Status: AC
  Filled 2018-11-11: qty 1

## 2018-11-11 MED ORDER — OXYCODONE HCL 5 MG/5ML PO SOLN: 5.0000 mg | Freq: Once | ORAL | Status: DC | PRN

## 2018-11-11 MED ORDER — ACETAMINOPHEN 500 MG PO TABS
1000.0000 mg | ORAL_TABLET | ORAL | Status: AC
  Administered 2018-11-11: 1000 mg via ORAL

## 2018-11-11 MED ORDER — FENTANYL CITRATE (PF) 100 MCG/2ML IJ SOLN
50.0000 ug | INTRAMUSCULAR | Status: DC | PRN
  Administered 2018-11-11: 25 ug via INTRAVENOUS

## 2018-11-11 MED ORDER — GLYCOPYRROLATE 0.2 MG/ML IJ SOLN
INTRAMUSCULAR | Status: DC | PRN
  Administered 2018-11-11: 0.2 mg via INTRAVENOUS

## 2018-11-11 MED ORDER — FENTANYL CITRATE (PF) 100 MCG/2ML IJ SOLN
INTRAMUSCULAR | Status: DC | PRN
  Administered 2018-11-11 (×2): 25 ug via INTRAVENOUS

## 2018-11-11 MED ORDER — HYDROCODONE-ACETAMINOPHEN 5-325 MG PO TABS: 1.0000 | ORAL_TABLET | Freq: Four times a day (QID) | ORAL | 0 refills | Status: DC | PRN

## 2018-11-11 MED ORDER — BUPIVACAINE HCL (PF) 0.25 % IJ SOLN
INTRAMUSCULAR | Status: DC | PRN
  Administered 2018-11-11: 10 mL

## 2018-11-11 MED ORDER — ONDANSETRON HCL 4 MG/2ML IJ SOLN
INTRAMUSCULAR | Status: DC | PRN
  Administered 2018-11-11: 4 mg via INTRAVENOUS

## 2018-11-11 MED ORDER — DEXAMETHASONE SODIUM PHOSPHATE 10 MG/ML IJ SOLN
INTRAMUSCULAR | Status: DC | PRN
  Administered 2018-11-11: 10 mg via INTRAVENOUS

## 2018-11-11 MED ORDER — PROPOFOL 10 MG/ML IV BOLUS
INTRAVENOUS | Status: AC
  Filled 2018-11-11: qty 20

## 2018-11-11 MED ORDER — CEFAZOLIN SODIUM-DEXTROSE 2-4 GM/100ML-% IV SOLN
INTRAVENOUS | Status: AC
  Filled 2018-11-11: qty 100

## 2018-11-11 MED ORDER — ONDANSETRON HCL 4 MG/2ML IJ SOLN
INTRAMUSCULAR | Status: AC
  Filled 2018-11-11: qty 2

## 2018-11-11 MED ORDER — CHLORHEXIDINE GLUCONATE CLOTH 2 % EX PADS: 6.0000 | MEDICATED_PAD | Freq: Once | CUTANEOUS | Status: DC

## 2018-11-11 MED ORDER — HYDROMORPHONE HCL 1 MG/ML IJ SOLN: 0.2500 mg | INTRAMUSCULAR | Status: DC | PRN

## 2018-11-11 MED ORDER — ACETAMINOPHEN 500 MG PO TABS
ORAL_TABLET | ORAL | Status: AC
  Filled 2018-11-11: qty 2

## 2018-11-11 MED ORDER — MIDAZOLAM HCL 2 MG/2ML IJ SOLN: 1.0000 mg | INTRAMUSCULAR | Status: DC | PRN

## 2018-11-11 MED ORDER — CEFAZOLIN SODIUM-DEXTROSE 2-4 GM/100ML-% IV SOLN
2.0000 g | INTRAVENOUS | Status: AC
  Administered 2018-11-11: 2 g via INTRAVENOUS

## 2018-11-11 MED ORDER — FENTANYL CITRATE (PF) 100 MCG/2ML IJ SOLN
INTRAMUSCULAR | Status: AC
  Filled 2018-11-11: qty 2

## 2018-11-11 MED ORDER — GLYCOPYRROLATE PF 0.2 MG/ML IJ SOSY
PREFILLED_SYRINGE | INTRAMUSCULAR | Status: AC
  Filled 2018-11-11: qty 1

## 2018-11-11 MED ORDER — OXYCODONE HCL 5 MG PO TABS: 5.0000 mg | ORAL_TABLET | Freq: Once | ORAL | Status: DC | PRN

## 2018-11-11 MED ORDER — OXYCODONE HCL 5 MG PO TABS
ORAL_TABLET | ORAL | Status: AC
  Filled 2018-11-11: qty 1

## 2018-11-11 MED ORDER — KETOROLAC TROMETHAMINE 30 MG/ML IJ SOLN: 15.0000 mg | Freq: Once | INTRAMUSCULAR | Status: DC | PRN

## 2018-11-11 MED ORDER — PROMETHAZINE HCL 25 MG/ML IJ SOLN: 6.2500 mg | INTRAMUSCULAR | Status: DC | PRN

## 2018-11-11 MED ORDER — PROPOFOL 10 MG/ML IV BOLUS
INTRAVENOUS | Status: DC | PRN
  Administered 2018-11-11: 150 mg via INTRAVENOUS

## 2018-11-11 MED ORDER — HEPARIN (PORCINE) IN NACL 2-0.9 UNITS/ML
INTRAMUSCULAR | Status: AC | PRN
  Administered 2018-11-11: 1 via INTRAVENOUS

## 2018-11-11 MED ORDER — MEPERIDINE HCL 25 MG/ML IJ SOLN: 6.2500 mg | INTRAMUSCULAR | Status: DC | PRN

## 2018-11-11 MED ORDER — EPHEDRINE SULFATE 50 MG/ML IJ SOLN
INTRAMUSCULAR | Status: DC | PRN
  Administered 2018-11-11: 10 mg via INTRAVENOUS

## 2018-11-11 SURGICAL SUPPLY — 63 items
ADH SKN CLS APL DERMABOND .7 (GAUZE/BANDAGES/DRESSINGS) ×1
APL PRP STRL LF DISP 70% ISPRP (MISCELLANEOUS) ×1
APL SKNCLS STERI-STRIP NONHPOA (GAUZE/BANDAGES/DRESSINGS)
BAG DECANTER FOR FLEXI CONT (MISCELLANEOUS) ×2 IMPLANT
BENZOIN TINCTURE PRP APPL 2/3 (GAUZE/BANDAGES/DRESSINGS) IMPLANT
BLADE HEX COATED 2.75 (ELECTRODE) ×2 IMPLANT
BLADE SURG 11 STRL SS (BLADE) ×2 IMPLANT
BLADE SURG 15 STRL LF DISP TIS (BLADE) ×1 IMPLANT
BLADE SURG 15 STRL SS (BLADE) ×2
CANISTER SUCT 1200ML W/VALVE (MISCELLANEOUS) IMPLANT
CHLORAPREP W/TINT 26 (MISCELLANEOUS) ×2 IMPLANT
COVER BACK TABLE REUSABLE LG (DRAPES) ×2 IMPLANT
COVER MAYO STAND REUSABLE (DRAPES) ×2 IMPLANT
COVER PROBE 5X48 (MISCELLANEOUS) ×2
COVER WAND RF STERILE (DRAPES) IMPLANT
DECANTER SPIKE VIAL GLASS SM (MISCELLANEOUS) ×1 IMPLANT
DERMABOND ADVANCED (GAUZE/BANDAGES/DRESSINGS) ×1
DERMABOND ADVANCED .7 DNX12 (GAUZE/BANDAGES/DRESSINGS) ×1 IMPLANT
DRAPE C-ARM 42X72 X-RAY (DRAPES) ×2 IMPLANT
DRAPE LAPAROSCOPIC ABDOMINAL (DRAPES) ×2 IMPLANT
DRAPE UTILITY XL STRL (DRAPES) ×2 IMPLANT
DRSG TEGADERM 2-3/8X2-3/4 SM (GAUZE/BANDAGES/DRESSINGS) IMPLANT
DRSG TEGADERM 4X4.75 (GAUZE/BANDAGES/DRESSINGS) ×3 IMPLANT
ELECT REM PT RETURN 9FT ADLT (ELECTROSURGICAL) ×2
ELECTRODE REM PT RTRN 9FT ADLT (ELECTROSURGICAL) ×1 IMPLANT
GAUZE SPONGE 4X4 12PLY STRL (GAUZE/BANDAGES/DRESSINGS) ×1 IMPLANT
GAUZE SPONGE 4X4 12PLY STRL LF (GAUZE/BANDAGES/DRESSINGS) ×2 IMPLANT
GLOVE BIO SURGEON STRL SZ 6.5 (GLOVE) ×1 IMPLANT
GLOVE BIOGEL PI IND STRL 7.0 (GLOVE) IMPLANT
GLOVE BIOGEL PI IND STRL 8 (GLOVE) ×1 IMPLANT
GLOVE BIOGEL PI INDICATOR 7.0 (GLOVE) ×1
GLOVE BIOGEL PI INDICATOR 8 (GLOVE) ×1
GLOVE ECLIPSE 8.0 STRL XLNG CF (GLOVE) ×2 IMPLANT
GOWN STRL REUS W/ TWL LRG LVL3 (GOWN DISPOSABLE) ×2 IMPLANT
GOWN STRL REUS W/TWL LRG LVL3 (GOWN DISPOSABLE) ×4
IV KIT MINILOC 20X1 SAFETY (NEEDLE) ×1 IMPLANT
KIT CVR 48X5XPRB PLUP LF (MISCELLANEOUS) ×1 IMPLANT
NDL HYPO 25X1 1.5 SAFETY (NEEDLE) ×1 IMPLANT
NDL SAFETY ECLIPSE 18X1.5 (NEEDLE) IMPLANT
NDL SPNL 22GX3.5 QUINCKE BK (NEEDLE) IMPLANT
NEEDLE HYPO 18GX1.5 SHARP (NEEDLE)
NEEDLE HYPO 22GX1.5 SAFETY (NEEDLE) IMPLANT
NEEDLE HYPO 25X1 1.5 SAFETY (NEEDLE) ×2 IMPLANT
NEEDLE SPNL 22GX3.5 QUINCKE BK (NEEDLE) IMPLANT
PACK BASIN DAY SURGERY FS (CUSTOM PROCEDURE TRAY) ×2 IMPLANT
PENCIL BUTTON HOLSTER BLD 10FT (ELECTRODE) ×2 IMPLANT
PORT POWER 8FR SL MRI (Set) ×1 IMPLANT
SET SHEATH INTRODUCER 10FR (MISCELLANEOUS) IMPLANT
SHEATH COOK PEEL AWAY SET 9F (SHEATH) IMPLANT
SLEEVE SCD COMPRESS KNEE MED (MISCELLANEOUS) ×2 IMPLANT
SPONGE LAP 4X18 RFD (DISPOSABLE) ×1 IMPLANT
STRIP CLOSURE SKIN 1/2X4 (GAUZE/BANDAGES/DRESSINGS) IMPLANT
SUT MON AB 4-0 PC3 18 (SUTURE) ×2 IMPLANT
SUT PROLENE 2 0 CT2 30 (SUTURE) IMPLANT
SUT PROLENE 2 0 SH DA (SUTURE) ×2 IMPLANT
SUT SILK 2 0 TIES 17X18 (SUTURE)
SUT SILK 2-0 18XBRD TIE BLK (SUTURE) IMPLANT
SUT VICRYL 3-0 CR8 SH (SUTURE) ×2 IMPLANT
SYR 5ML LUER SLIP (SYRINGE) ×2 IMPLANT
SYR CONTROL 10ML LL (SYRINGE) ×2 IMPLANT
TOWEL GREEN STERILE FF (TOWEL DISPOSABLE) ×4 IMPLANT
TUBE CONNECTING 20X1/4 (TUBING) IMPLANT
YANKAUER SUCT BULB TIP NO VENT (SUCTIONS) IMPLANT

## 2018-11-11 NOTE — Anesthesia Preprocedure Evaluation (Signed)
Anesthesia Evaluation  Patient identified by MRN, date of birth, ID band Patient awake    Reviewed: Allergy & Precautions, NPO status , Patient's Chart, lab work & pertinent test results  Airway Mallampati: III  TM Distance: >3 FB Neck ROM: Full    Dental no notable dental hx. (+) Dental Advisory Given   Pulmonary neg pulmonary ROS,    Pulmonary exam normal breath sounds clear to auscultation       Cardiovascular hypertension, negative cardio ROS Normal cardiovascular exam Rhythm:Regular Rate:Normal     Neuro/Psych negative neurological ROS  negative psych ROS   GI/Hepatic Neg liver ROS, GERD  ,  Endo/Other  negative endocrine ROSdiabetes  Renal/GU negative Renal ROS     Musculoskeletal negative musculoskeletal ROS (+)   Abdominal   Peds  Hematology negative hematology ROS (+) anemia ,   Anesthesia Other Findings   Reproductive/Obstetrics negative OB ROS                             Anesthesia Physical Anesthesia Plan  ASA: II  Anesthesia Plan: General   Post-op Pain Management:    Induction: Intravenous  PONV Risk Score and Plan: 4 or greater and Ondansetron, Dexamethasone, Midazolam, Treatment may vary due to age or medical condition and Scopolamine patch - Pre-op  Airway Management Planned: LMA  Additional Equipment:   Intra-op Plan:   Post-operative Plan: Extubation in OR  Informed Consent: I have reviewed the patients History and Physical, chart, labs and discussed the procedure including the risks, benefits and alternatives for the proposed anesthesia with the patient or authorized representative who has indicated his/her understanding and acceptance.     Dental advisory given  Plan Discussed with: CRNA  Anesthesia Plan Comments:         Anesthesia Quick Evaluation

## 2018-11-11 NOTE — Progress Notes (Signed)
[place] Spiritual Care Note  Followed up with Erin Lawson by phone as planned. She is processing some disappointment about lack of support from key people in her life, while taking great strength and comfort in her faith and reading Scripture. She is also advocating for herself and working hard to close communication gaps about important paperwork, such as FMLA forms. Provided empathic listening, pastoral reflection, affirmation, and encouragement. Ensured that she has my direct dial number for f/u conversation as desired.   [place], [place], [facility] Pager [phone] Voicemail [phone]

## 2018-11-11 NOTE — Op Note (Addendum)
Preoperative diagnosis: PAC needed for chemotherapy   Postoperative diagnosis: Same  Procedure: Portacath Placement  With C arm and U/S  GUIDANCE   Surgeon: Turner Daniels, MD, FACS  Anesthesia: General and 0.25 % marcaine with epinephrine  Clinical History and Indications: The patient is getting ready to begin chemotherapy for her cancer. She  needs a Port-A-Cath for venous access. Risk of bleeding, infection,  Collapse lung,  Death,  DVT,  Organ injury,  Mediastinal injury,  Injury to heart,  Injury to blood vessels,  Nerves,  Migration of catheter,  Embolization of catheter and the need for more surgery.  Description of Procedure: I have seen the patient in the holding area and confirmed the plans for the procedure as noted above. I reviewed the risks and complications again and the patient has no further questions. She wishes to proceed.   The patient was then taken to the operating room. After satisfactory general  anesthesia had been obtained the upper chest and lower neck were prepped and draped as a sterile field. The timeout was done.  The right internal jugular vein  was entered under U/S guidance  and the guidewire threaded into the superior vena cava right atrial area under fluoroscopic guidance. An incision was then made on the anterior chest wall and a subcutaneous pocket fashioned for the port reservoir.  The port tubing was then brought through a subcutaneous tunnel from the port site to the guidewire site.  The port and catheter were attached, locked  and flushed. The catheter was measured and cut to appropriate length.The dilator and peel-away sheath were then advanced over the guidewire while monitoring this with fluoroscopy. The guidewire and dilator were removed and the tubing threaded to approximately 21 cm. The peel-away sheath was then removed. The catheter aspirated and flushed easily. Using fluoroscopy the tip was in the superior vena cava right atrial junction area. It  aspirated and flushed easily. That aspirated and flushed easily.  The reservoir was secured to the fascia with 1 sutures of 2-0 Prolene. A final check with fluoroscopy was done to make sure we had no kinks and good positioning of the tip of the catheter. Everything appeared to be okay. The catheter was aspirated, flushed with dilute heparin and then concentrated aqueous heparin. The port was left accessed.  The incision was then closed with interrupted 3-0 Vicryl, and 4-0 Monocryl subcuticular with Dermabond on the skin.  There were no operative complications. Estimated blood loss was minimal. All counts were correct. The patient tolerated the procedure well.  Turner Daniels, MD, FACS

## 2018-11-11 NOTE — Anesthesia Postprocedure Evaluation (Signed)
Anesthesia Post Note  Patient: Erin Lawson  Procedure(s) Performed: INSERTION PORT-A-CATH WITH ULTRASOUND (Right Chest)     Patient location during evaluation: PACU Anesthesia Type: General Level of consciousness: sedated and patient cooperative Pain management: pain level controlled Vital Signs Assessment: post-procedure vital signs reviewed and stable Respiratory status: spontaneous breathing Cardiovascular status: stable Anesthetic complications: no    Last Vitals:  Vitals:   11/11/18 1645 11/11/18 1705  BP: (!) 143/76 (!) 151/68  Pulse: 61 (!) 57  Resp: 19 18  Temp:  36.7 C  SpO2: 98% 100%    Last Pain:  Vitals:   11/11/18 1705  TempSrc:   PainSc: 0-No pain                 Nolon Nations

## 2018-11-11 NOTE — Transfer of Care (Signed)
Immediate Anesthesia Transfer of Care Note  Patient: Erin Lawson  Procedure(s) Performed: INSERTION PORT-A-CATH WITH ULTRASOUND (Right Chest)  Patient Location: PACU  Anesthesia Type:General  Level of Consciousness: sedated  Airway & Oxygen Therapy: Patient Spontanous Breathing and Patient connected to face mask oxygen  Post-op Assessment: Report given to RN and Post -op Vital signs reviewed and stable  Post vital signs: Reviewed and stable  Last Vitals:  Vitals Value Taken Time  BP 116/66 11/11/18 1535  Temp    Pulse 70 11/11/18 1538  Resp 12 11/11/18 1538  SpO2 100 % 11/11/18 1538  Vitals shown include unvalidated device data.  Last Pain:  Vitals:   11/11/18 1335  TempSrc: Oral  PainSc: 0-No pain      Patients Stated Pain Goal: 0 ([id number])  Complications: No apparent anesthesia complications

## 2018-11-11 NOTE — Interval H&P Note (Signed)
History and Physical Interval Note:  11/11/2018 1:55 PM  Erin Lawson  has presented today for surgery, with the diagnosis of BREAST CANCER.  The various methods of treatment have been discussed with the patient and family. After consideration of risks, benefits and other options for treatment, the patient has consented to  Procedure(s): INSERTION PORT-A-CATH WITH ULTRASOUND (N/A) as a surgical intervention.  The patient's history has been reviewed, patient examined, no change in status, stable for surgery.  I have reviewed the patient's chart and labs.  Questions were answered to the patient's satisfaction.     [place]

## 2018-11-11 NOTE — Anesthesia Procedure Notes (Signed)
Procedure Name: LMA Insertion Date/Time: 11/11/2018 2:31 PM Performed by: Collier Bullock, CRNA Pre-anesthesia Checklist: Patient identified, Emergency Drugs available, Suction available and Patient being monitored Patient Re-evaluated:Patient Re-evaluated prior to induction Oxygen Delivery Method: Circle system utilized Preoxygenation: Pre-oxygenation with 100% oxygen Induction Type: IV induction Ventilation: Mask ventilation without difficulty LMA: LMA inserted LMA Size: 4.0 Number of attempts: 1 Placement Confirmation: positive ETCO2 and breath sounds checked- equal and bilateral Tube secured with: Tape Dental Injury: Teeth and Oropharynx as per pre-operative assessment

## 2018-11-11 NOTE — Progress Notes (Signed)
Patient Care Team: Forrest Moron, MD as PCP - General (Internal Medicine) Mauro Kaufmann, RN as Oncology Nurse Navigator Rockwell Germany, RN as Oncology Nurse Navigator Erroll Luna, MD as Consulting Physician (General Surgery) Nicholas Lose, MD as Consulting Physician (Hematology and Oncology) Gery Pray, MD as Consulting Physician (Radiation Oncology)  DIAGNOSIS:    ICD-10-CM   1. Malignant neoplasm of upper-inner quadrant of left breast in female, estrogen receptor negative ([place])  C50.212    Z17.1     SUMMARY OF ONCOLOGIC HISTORY: Oncology History  Malignant neoplasm of upper-inner quadrant of left breast in female, estrogen receptor negative ([place])  10/26/2018 Initial Diagnosis   Patient palpated a left breast mass x 5 weeks. Mammogram showed a 2.8cm mass with calcifications spanning 3.2cm in the left breast and two mildly thickened axillary lymph nodes up to 0.4cm. Biopsy showed IDC, grade 3, HER-2 + (3+), ER/PR -, Ki67 50%, and no evidence of carcinoma in the left axilla.    10/28/2018 Cancer Staging   Staging form: Breast, AJCC 8th Edition - Clinical stage from 10/28/2018: Stage IIA (cT2, cN0, cM0, G2, ER-, PR-, HER2+) - Signed by Nicholas Lose, MD on 10/28/2018   11/12/2018 -  Chemotherapy   The patient had palonosetron (ALOXI) injection 0.25 mg, 0.25 mg, Intravenous,  Once, 0 of 6 cycles pegfilgrastim-jmdb (FULPHILA) injection 6 mg, 6 mg, Subcutaneous,  Once, 0 of 6 cycles CARBOplatin (PARAPLATIN) 490 mg in sodium chloride 0.9 % 250 mL chemo infusion, 490 mg (100 % of original dose 493 mg), Intravenous,  Once, 0 of 6 cycles Dose modification:   (original dose 493 mg, Cycle 1, Reason: Provider Judgment) DOCEtaxel (TAXOTERE) 110 mg in sodium chloride 0.9 % 250 mL chemo infusion, 65 mg/m2 = 110 mg (100 % of original dose 65 mg/m2), Intravenous,  Once, 0 of 6 cycles Dose modification: 65 mg/m2 (original dose 65 mg/m2, Cycle 1, Reason: Provider Judgment) pertuzumab  (PERJETA) 420 mg in sodium chloride 0.9 % 250 mL chemo infusion, 420 mg (100 % of original dose 420 mg), Intravenous, Once, 0 of 6 cycles Dose modification: 420 mg (original dose 420 mg, Cycle 1, Reason: Provider Judgment) fosaprepitant (EMEND) 150 mg, dexamethasone (DECADRON) 12 mg in sodium chloride 0.9 % 145 mL IVPB, , Intravenous,  Once, 0 of 6 cycles trastuzumab-dkst (OGIVRI) 525 mg in sodium chloride 0.9 % 250 mL chemo infusion, 8 mg/kg = 525 mg, Intravenous,  Once, 0 of 6 cycles  for chemotherapy treatment.      CHIEF COMPLIANT: Cycle 1 TCH Perjeta  INTERVAL HISTORY: Erin Lawson is a 58 y.o. with above-mentioned history of left breast cancer currently on neoadjuvant chemotherapy with TCH Perjeta. Echo on 11/05/18 showed an ejection fraction of 65-70%. Her port was inserted on 11/11/18 by Dr. Brantley Stage. She presents to the clinic today for cycle 1.   REVIEW OF SYSTEMS:   Constitutional: Denies fevers, chills or abnormal weight loss Eyes: Denies blurriness of vision Ears, nose, mouth, throat, and face: Denies mucositis or sore throat Respiratory: Denies cough, dyspnea or wheezes Cardiovascular: Denies palpitation, chest discomfort Gastrointestinal: Denies nausea, heartburn or change in bowel habits Skin: Denies abnormal skin rashes Lymphatics: Denies new lymphadenopathy or easy bruising Neurological: Denies numbness, tingling or new weaknesses Behavioral/Psych: Mood is stable, no new changes  Extremities: No lower extremity edema Breast: denies any pain or lumps or nodules in either breasts All other systems were reviewed with the patient and are negative.  I have reviewed the past medical history,  past surgical history, social history and family history with the patient and they are unchanged from previous note.  ALLERGIES:  is allergic to atenolol; poractant alfa; pork-derived products; and meloxicam.  MEDICATIONS:  Current Outpatient Medications  Medication Sig  Dispense Refill  . amLODipine (NORVASC) 5 MG tablet Take 1 tablet (5 mg total) by mouth daily. 90 tablet 1  . blood glucose meter kit and supplies KIT Dispense based on patient and insurance preference. Use up to four times daily as directed. (FOR ICD-9 250.00, 250.01). 1 each 0  . blood glucose meter kit and supplies Dispense based on patient and insurance preference. Use up to four times daily as directed. (FOR ICD-10 E10.9, E11.9). 1 each 0  . dexamethasone (DECADRON) 4 MG tablet Take 1 tablet (4 mg total) by mouth 2 (two) times daily. Take 1 tablet day before chemo and 1 tablet day after chemo with food 12 tablet 0  . ELDERBERRY PO Take by mouth.    . Ferrous Sulfate (IRON PO) Take by mouth. 1 teaspoon daily    . GARLIC PO Take by mouth.    Marland Kitchen glucose blood (CONTOUR NEXT TEST) test strip Check sugar once daily  Dx:  DMII controlled non-insulin dependent. 100 each 3  . HYDROcodone-acetaminophen (NORCO/VICODIN) 5-325 MG tablet Take 1 tablet by mouth every 6 (six) hours as needed for moderate pain. 15 tablet 0  . LANCETS ULTRA FINE MISC Check sugar up one time daily dx: new onset DMII uncontrolled non-insulin 100 each 3  . lidocaine-prilocaine (EMLA) cream Apply to affected area once 30 g 3  . LORazepam (ATIVAN) 0.5 MG tablet Take 1 tablet (0.5 mg total) by mouth at bedtime as needed for sleep. 30 tablet 0  . metFORMIN (GLUCOPHAGE) 500 MG tablet Take 1 and half tablet by  Mouth daily for diabetes (Patient taking differently: 750 mg. Take 1 and half tablet by  Mouth daily for diabetes) 90 tablet 1  . Multiple Minerals (CALCIUM/MAGNESIUM/ZINC PO) Take by mouth daily.    . ondansetron (ZOFRAN) 8 MG tablet Take 1 tablet (8 mg total) by mouth 2 (two) times daily as needed (Nausea or vomiting). Begin 4 days after chemotherapy. 30 tablet 1  . prochlorperazine (COMPAZINE) 10 MG tablet Take 1 tablet (10 mg total) by mouth every 6 (six) hours as needed (Nausea or vomiting). 30 tablet 1  . UNABLE TO FIND Med  Name: Apple Vinegar Mix - 1 teaspoon daily     No current facility-administered medications for this visit.    Facility-Administered Medications Ordered in Other Visits  Medication Dose Route Frequency Provider Last Rate Last Dose  . sodium chloride flush (NS) 0.9 % injection 10 mL  10 mL Intravenous PRN Nicholas Lose, MD   10 mL at 11/12/18 0804    PHYSICAL EXAMINATION: ECOG PERFORMANCE STATUS: 1 - Symptomatic but completely ambulatory  There were no vitals filed for this visit. There were no vitals filed for this visit.  GENERAL: alert, no distress and comfortable SKIN: skin color, texture, turgor are normal, no rashes or significant lesions EYES: normal, Conjunctiva are pink and non-injected, sclera clear OROPHARYNX: no exudate, no erythema and lips, buccal mucosa, and tongue normal  NECK: supple, thyroid normal size, non-tender, without nodularity LYMPH: no palpable lymphadenopathy in the cervical, axillary or inguinal LUNGS: clear to auscultation and percussion with normal breathing effort HEART: regular rate & rhythm and no murmurs and no lower extremity edema ABDOMEN: abdomen soft, non-tender and normal bowel sounds MUSCULOSKELETAL: no cyanosis of  digits and no clubbing  NEURO: alert & oriented x 3 with fluent speech, no focal motor/sensory deficits EXTREMITIES: No lower extremity edema  LABORATORY DATA:  I have reviewed the data as listed CMP Latest Ref Rng & Units 10/28/2018 10/12/2018 07/21/2018  Glucose 70 - 99 mg/dL 234(H) 126(H) 166(H)  BUN 6 - 20 mg/dL '13 10 11  ' Creatinine 0.44 - 1.00 mg/dL 0.86 0.68 0.68  Sodium 135 - 145 mmol/L 139 140 139  Potassium 3.5 - 5.1 mmol/L 4.2 4.4 4.4  Chloride 98 - 111 mmol/L 102 101 101  CO2 22 - 32 mmol/L '24 21 20  ' Calcium 8.9 - 10.3 mg/dL 9.5 10.4(H) 10.4(H)  Total Protein 6.5 - 8.1 g/dL 7.6 7.3 7.3  Total Bilirubin 0.3 - 1.2 mg/dL 0.5 0.3 0.5  Alkaline Phos 38 - 126 U/L 125 147(H) 132(H)  AST 15 - 41 U/L '19 17 25  ' ALT 0 - 44 U/L  24 24 32    Lab Results  Component Value Date   WBC 15.7 (H) 11/12/2018   HGB 10.7 (L) 11/12/2018   HCT 33.9 (L) 11/12/2018   MCV 72.7 (L) 11/12/2018   PLT 430 (H) 11/12/2018   NEUTROABS 13.3 (H) 11/12/2018    ASSESSMENT & PLAN:  Malignant neoplasm of upper-inner quadrant of left breast in female, estrogen receptor negative ([place]) 10/26/2018:Patient palpated a left breast mass x 5 weeks. Mammogram showed a 2.8cm mass with calcifications spanning 3.2cm in the left breast and two mildly thickened axillary lymph nodes up to 0.4cm. Biopsy showed IDC, grade 3, HER-2 + (3+), ER/PR -, Ki67 50%, and no evidence of carcinoma in the left axilla.   Treatment plan: 1. Neoadjuvant chemotherapy with TCH Perjeta 6 cycles followed by Herceptin Perjeta maintenance for 1 year 2. Followed by breast conserving surgery  with sentinel lymph node study 3. Followed by adjuvant radiation therapy  ---------------------------------------------------------------------------------------------------------------------------------------------- Current treatment: Cycle 1 day 1 [place] reviewed Chemo consent obtained chemo education completed Antiemetics reviewed Echocardiogram: 11/05/2018: EF 65 to 70% Patient is diabetic and we are anxious about her blood sugars.  Severe anxiety issues: We are connecting her to our chaplain and Education officer, museum.  Return to clinic in 1 week for toxicity check    No orders of the defined types were placed in this encounter.  The patient has a good understanding of the overall plan. she agrees with it. she will call with any problems that may develop before the next visit here.  Nicholas Lose, MD 11/12/2018  Julious Oka Dorshimer am acting as scribe for Dr. Nicholas Lose.  I have reviewed the above documentation for accuracy and completeness, and I agree with the above.

## 2018-11-11 NOTE — Assessment & Plan Note (Signed)
10/26/2018:Patient palpated a left breast mass x 5 weeks. Mammogram showed a 2.8cm mass with calcifications spanning 3.2cm in the left breast and two mildly thickened axillary lymph nodes up to 0.4cm. Biopsy showed IDC, grade 3, HER-2 + (3+), ER/PR -, Ki67 50%, and no evidence of carcinoma in the left axilla.   Treatment plan: 1. Neoadjuvant chemotherapy with TCH Perjeta 6 cycles followed by Herceptin Perjeta maintenance for 1 year 2. Followed by breast conserving surgery  with sentinel lymph node study 3. Followed by adjuvant radiation therapy  ---------------------------------------------------------------------------------------------------------------------------------------------- Current treatment: Cycle 1 day 1 [place] reviewed Chemo consent obtained chemo education completed Antiemetics reviewed Echocardiogram: 11/05/2018: EF 65 to 70% Return to clinic in 1 week for toxicity check

## 2018-11-11 NOTE — Discharge Instructions (Signed)
PORT-A-CATH: POST OP INSTRUCTIONS  Always review your discharge instruction sheet given to you by the facility where your surgery was performed.   1. A prescription for pain medication may be given to you upon discharge. Take your pain medication as prescribed, if needed. If narcotic pain medicine is not needed, then you make take acetaminophen (Tylenol) or ibuprofen (Advil) as needed. *You had 1000mg  of Tylenol at 1:41pm 2. Take your usually prescribed medications unless otherwise directed. 3. If you need a refill on your pain medication, please contact our office. All narcotic pain medicine now requires a paper prescription.  Phoned in and fax refills are no longer allowed by law.  Prescriptions will not be filled after 5 pm or on weekends.  4. You should follow a light diet for the remainder of the day after your procedure. 5. Most patients will experience some mild swelling and/or bruising in the area of the incision. It may take several days to resolve. 6. It is common to experience some constipation if taking pain medication after surgery. Increasing fluid intake and taking a stool softener (such as Colace) will usually help or prevent this problem from occurring. A mild laxative (Milk of Magnesia or Miralax) should be taken according to package directions if there are no bowel movements after 48 hours.  7. Unless discharge instructions indicate otherwise, you may remove your bandages 48 hours after surgery, and you may shower at that time. You may have steri-strips (small white skin tapes) in place directly over the incision.  These strips should be left on the skin for 7-10 days.  If your surgeon used Dermabond (skin glue) on the incision, you may shower in 24 hours.  The glue will flake off over the next 2-3 weeks.  8. If your port is left accessed at the end of surgery (needle left in port), the dressing cannot get wet and should only by changed by a healthcare professional. When the port  is no longer accessed (when the needle has been removed), follow step 7.   9. ACTIVITIES:  Limit activity involving your arms for the next 72 hours. Do no strenuous exercise or activity for 1 week. You may drive when you are no longer taking prescription pain medication, you can comfortably wear a seatbelt, and you can maneuver your car. 10.You may need to see your doctor in the office for a follow-up appointment.  Please       check with your doctor.  11.When you receive a new Port-a-Cath, you will get a product guide and        ID card.  Please keep them in case you need them.  WHEN TO CALL YOUR DOCTOR [phone]): 1. Fever over 101.0 2. Chills 3. Continued bleeding from incision 4. Increased redness and tenderness at the site 5. Shortness of breath, difficulty breathing   The clinic staff is available to answer your questions during regular business hours. Please dont hesitate to call and ask to speak to one of the nurses or medical assistants for clinical concerns. If you have a medical emergency, go to the nearest emergency room or call 911.  A surgeon from [facility] is always on call at the hospital.     For further information, please visit [email or web address]    Post Anesthesia Home Care Instructions  Activity: Get plenty of rest for the remainder of the day. A responsible individual must stay with you for 24 hours following the procedure.  For the next  24 hours, DO NOT: -Drive a car -Paediatric nurse -Drink alcoholic beverages -Take any medication unless instructed by your physician -Make any legal decisions or sign important papers.  Meals: Start with liquid foods such as gelatin or soup. Progress to regular foods as tolerated. Avoid greasy, spicy, heavy foods. If nausea and/or vomiting occur, drink only clear liquids until the nausea and/or vomiting subsides. Call your physician if vomiting continues.  Special Instructions/Symptoms: Your  throat may feel dry or sore from the anesthesia or the breathing tube placed in your throat during surgery. If this causes discomfort, gargle with warm salt water. The discomfort should disappear within 24 hours.  If you had a scopolamine patch placed behind your ear for the management of post- operative nausea and/or vomiting:  1. The medication in the patch is effective for 72 hours, after which it should be removed.  Wrap patch in a tissue and discard in the trash. Wash hands thoroughly with soap and water. 2. You may remove the patch earlier than 72 hours if you experience unpleasant side effects which may include dry mouth, dizziness or visual disturbances. 3. Avoid touching the patch. Wash your hands with soap and water after contact with the patch.

## 2018-11-12 ENCOUNTER — Other Ambulatory Visit: Payer: Self-pay

## 2018-11-12 ENCOUNTER — Inpatient Hospital Stay: Payer: 59

## 2018-11-12 ENCOUNTER — Encounter: Payer: Self-pay | Admitting: Hematology and Oncology

## 2018-11-12 ENCOUNTER — Inpatient Hospital Stay (HOSPITAL_BASED_OUTPATIENT_CLINIC_OR_DEPARTMENT_OTHER): Payer: 59 | Admitting: Hematology and Oncology

## 2018-11-12 ENCOUNTER — Encounter (HOSPITAL_BASED_OUTPATIENT_CLINIC_OR_DEPARTMENT_OTHER): Payer: Self-pay | Admitting: Surgery

## 2018-11-12 VITALS — BP 129/59 | HR 69 | Temp 98.7°F | Resp 17

## 2018-11-12 DIAGNOSIS — Z5112 Encounter for antineoplastic immunotherapy: Secondary | ICD-10-CM | POA: Diagnosis not present

## 2018-11-12 DIAGNOSIS — C50212 Malignant neoplasm of upper-inner quadrant of left female breast: Secondary | ICD-10-CM

## 2018-11-12 DIAGNOSIS — Z95828 Presence of other vascular implants and grafts: Secondary | ICD-10-CM

## 2018-11-12 DIAGNOSIS — Z171 Estrogen receptor negative status [ER-]: Secondary | ICD-10-CM

## 2018-11-12 LAB — CBC WITH DIFFERENTIAL (CANCER CENTER ONLY)
Abs Immature Granulocytes: 0.12 10*3/uL — ABNORMAL HIGH (ref 0.00–0.07)
Basophils Absolute: 0 10*3/uL (ref 0.0–0.1)
Basophils Relative: 0 %
Eosinophils Absolute: 0 10*3/uL (ref 0.0–0.5)
Eosinophils Relative: 0 %
HCT: 33.9 % — ABNORMAL LOW (ref 36.0–46.0)
Hemoglobin: 10.7 g/dL — ABNORMAL LOW (ref 12.0–15.0)
Immature Granulocytes: 1 %
Lymphocytes Relative: 12 %
Lymphs Abs: 1.9 10*3/uL (ref 0.7–4.0)
MCH: 23 pg — ABNORMAL LOW (ref 26.0–34.0)
MCHC: 31.6 g/dL (ref 30.0–36.0)
MCV: 72.7 fL — ABNORMAL LOW (ref 80.0–100.0)
Monocytes Absolute: 0.4 10*3/uL (ref 0.1–1.0)
Monocytes Relative: 2 %
Neutro Abs: 13.3 10*3/uL — ABNORMAL HIGH (ref 1.7–7.7)
Neutrophils Relative %: 85 %
Platelet Count: 430 10*3/uL — ABNORMAL HIGH (ref 150–400)
RBC: 4.66 MIL/uL (ref 3.87–5.11)
RDW: 13.5 % (ref 11.5–15.5)
WBC Count: 15.7 10*3/uL — ABNORMAL HIGH (ref 4.0–10.5)
nRBC: 0 % (ref 0.0–0.2)

## 2018-11-12 LAB — CMP (CANCER CENTER ONLY)
ALT: 22 U/L (ref 0–44)
AST: 15 U/L (ref 15–41)
Albumin: 4.2 g/dL (ref 3.5–5.0)
Alkaline Phosphatase: 97 U/L (ref 38–126)
Anion gap: 14 (ref 5–15)
BUN: 17 mg/dL (ref 6–20)
CO2: 20 mmol/L — ABNORMAL LOW (ref 22–32)
Calcium: 9.8 mg/dL (ref 8.9–10.3)
Chloride: 106 mmol/L (ref 98–111)
Creatinine: 0.84 mg/dL (ref 0.44–1.00)
GFR, Est AFR Am: >60 mL/min (ref >60)
GFR, Estimated: >60 mL/min (ref >60)
Glucose, Bld: 255 mg/dL — ABNORMAL HIGH (ref 70–99)
Potassium: 4.2 mmol/L (ref 3.5–5.1)
Sodium: 140 mmol/L (ref 135–145)
Total Bilirubin: 0.3 mg/dL (ref 0.3–1.2)
Total Protein: 7.2 g/dL (ref 6.5–8.1)

## 2018-11-12 MED ORDER — SODIUM CHLORIDE 0.9% FLUSH
10.0000 mL | INTRAVENOUS | Status: DC | PRN
  Administered 2018-11-12: 10 mL
  Filled 2018-11-12: qty 10

## 2018-11-12 MED ORDER — ACETAMINOPHEN 325 MG PO TABS
650.0000 mg | ORAL_TABLET | Freq: Once | ORAL | Status: AC
  Administered 2018-11-12: 650 mg via ORAL

## 2018-11-12 MED ORDER — TRASTUZUMAB-DKST CHEMO 150 MG IV SOLR
8.0000 mg/kg | Freq: Once | INTRAVENOUS | Status: AC
  Administered 2018-11-12: 525 mg via INTRAVENOUS
  Filled 2018-11-12: qty 25

## 2018-11-12 MED ORDER — SODIUM CHLORIDE 0.9 % IV SOLN
65.0000 mg/m2 | Freq: Once | INTRAVENOUS | Status: AC
  Administered 2018-11-12: 110 mg via INTRAVENOUS
  Filled 2018-11-12: qty 11

## 2018-11-12 MED ORDER — ACETAMINOPHEN 325 MG PO TABS
ORAL_TABLET | ORAL | Status: AC
  Filled 2018-11-12: qty 2

## 2018-11-12 MED ORDER — SODIUM CHLORIDE 0.9 % IV SOLN
420.0000 mg | Freq: Once | INTRAVENOUS | Status: AC
  Administered 2018-11-12: 420 mg via INTRAVENOUS
  Filled 2018-11-12: qty 14

## 2018-11-12 MED ORDER — PALONOSETRON HCL INJECTION 0.25 MG/5ML
0.2500 mg | Freq: Once | INTRAVENOUS | Status: AC
  Administered 2018-11-12: 0.25 mg via INTRAVENOUS

## 2018-11-12 MED ORDER — SODIUM CHLORIDE 0.9 % IV SOLN
502.0000 mg | Freq: Once | INTRAVENOUS | Status: AC
  Administered 2018-11-12: 500 mg via INTRAVENOUS
  Filled 2018-11-12: qty 50

## 2018-11-12 MED ORDER — SODIUM CHLORIDE 0.9% FLUSH
10.0000 mL | INTRAVENOUS | Status: DC | PRN
  Administered 2018-11-12: 10 mL via INTRAVENOUS
  Filled 2018-11-12: qty 10

## 2018-11-12 MED ORDER — SODIUM CHLORIDE 0.9 % IV SOLN
Freq: Once | INTRAVENOUS | Status: AC
  Administered 2018-11-12: 09:00:00 via INTRAVENOUS
  Filled 2018-11-12: qty 250

## 2018-11-12 MED ORDER — DIPHENHYDRAMINE HCL 25 MG PO CAPS
50.0000 mg | ORAL_CAPSULE | Freq: Once | ORAL | Status: AC
  Administered 2018-11-12: 50 mg via ORAL

## 2018-11-12 MED ORDER — PALONOSETRON HCL INJECTION 0.25 MG/5ML
INTRAVENOUS | Status: AC
  Filled 2018-11-12: qty 5

## 2018-11-12 MED ORDER — DIPHENHYDRAMINE HCL 25 MG PO CAPS
ORAL_CAPSULE | ORAL | Status: AC
  Filled 2018-11-12: qty 2

## 2018-11-12 MED ORDER — SODIUM CHLORIDE 0.9 % IV SOLN
Freq: Once | INTRAVENOUS | Status: AC
  Administered 2018-11-12: 14:00:00 via INTRAVENOUS
  Filled 2018-11-12: qty 5

## 2018-11-12 MED ORDER — HEPARIN SOD (PORK) LOCK FLUSH 100 UNIT/ML IV SOLN
500.0000 [IU] | Freq: Once | INTRAVENOUS | Status: AC | PRN
  Administered 2018-11-12: 500 [IU]
  Filled 2018-11-12: qty 5

## 2018-11-12 NOTE — Patient Instructions (Signed)
[facility] Discharge Instructions for Patients Receiving Chemotherapy  Today you received the following chemotherapy agents: Trastuzumab, Pertuzumab, Docetaxel, Carboplatin  To help prevent nausea and vomiting after your treatment, we encourage you to take your nausea medication as directed.   If you develop nausea and vomiting that is not controlled by your nausea medication, call the clinic.   BELOW ARE SYMPTOMS THAT SHOULD BE REPORTED IMMEDIATELY:  *FEVER GREATER THAN 100.5 F  *CHILLS WITH OR WITHOUT FEVER  NAUSEA AND VOMITING THAT IS NOT CONTROLLED WITH YOUR NAUSEA MEDICATION  *UNUSUAL SHORTNESS OF BREATH  *UNUSUAL BRUISING OR BLEEDING  TENDERNESS IN MOUTH AND THROAT WITH OR WITHOUT PRESENCE OF ULCERS  *URINARY PROBLEMS  *BOWEL PROBLEMS  UNUSUAL RASH Items with * indicate a potential emergency and should be followed up as soon as possible.  Feel free to call the clinic should you have any questions or concerns. The clinic phone number is [phone].  Please show the [facility] at check-in to the Emergency Department and triage nurse.  Trastuzumab injection for infusion What is this medicine? TRASTUZUMAB (tras TOO zoo mab) is a monoclonal antibody. It is used to treat breast cancer and stomach cancer. This medicine may be used for other purposes; ask your health care provider or pharmacist if you have questions. COMMON BRAND NAME(S): Herceptin, Galvin Proffer, Trazimera What should I tell my health care provider before I take this medicine? They need to know if you have any of these conditions:  heart disease  heart failure  lung or breathing disease, like asthma  an unusual or allergic reaction to trastuzumab, benzyl alcohol, or other medications, foods, dyes, or preservatives  pregnant or trying to get pregnant  breast-feeding How should I use this medicine? This drug is given as an infusion into a vein. It is  administered in a hospital or clinic by a specially trained health care professional. Talk to your pediatrician regarding the use of this medicine in children. This medicine is not approved for use in children. Overdosage: If you think you have taken too much of this medicine contact a poison control center or emergency room at once. NOTE: This medicine is only for you. Do not share this medicine with others. What if I miss a dose? It is important not to miss a dose. Call your doctor or health care professional if you are unable to keep an appointment. What may interact with this medicine? This medicine may interact with the following medications:  certain types of chemotherapy, such as daunorubicin, doxorubicin, epirubicin, and idarubicin This list may not describe all possible interactions. Give your health care provider a list of all the medicines, herbs, non-prescription drugs, or dietary supplements you use. Also tell them if you smoke, drink alcohol, or use illegal drugs. Some items may interact with your medicine. What should I watch for while using this medicine? Visit your doctor for checks on your progress. Report any side effects. Continue your course of treatment even though you feel ill unless your doctor tells you to stop. Call your doctor or health care professional for advice if you get a fever, chills or sore throat, or other symptoms of a cold or flu. Do not treat yourself. Try to avoid being around people who are sick. You may experience fever, chills and shaking during your first infusion. These effects are usually mild and can be treated with other medicines. Report any side effects during the infusion to your health care professional.  Fever and chills usually do not happen with later infusions. Do not become pregnant while taking this medicine or for 7 months after stopping it. Women should inform their doctor if they wish to become pregnant or think they might be pregnant.  Women of child-bearing potential will need to have a negative pregnancy test before starting this medicine. There is a potential for serious side effects to an unborn child. Talk to your health care professional or pharmacist for more information. Do not breast-feed an infant while taking this medicine or for 7 months after stopping it. Women must use effective birth control with this medicine. What side effects may I notice from receiving this medicine? Side effects that you should report to your doctor or health care professional as soon as possible:  allergic reactions like skin rash, itching or hives, swelling of the face, lips, or tongue  chest pain or palpitations  cough  dizziness  feeling faint or lightheaded, falls  fever  general ill feeling or flu-like symptoms  signs of worsening heart failure like breathing problems; swelling in your legs and feet  unusually weak or tired Side effects that usually do not require medical attention (report to your doctor or health care professional if they continue or are bothersome):  bone pain  changes in taste  diarrhea  joint pain  nausea/vomiting  weight loss This list may not describe all possible side effects. Call your doctor for medical advice about side effects. You may report side effects to FDA at 1-800-FDA-1088. Where should I keep my medicine? This drug is given in a hospital or clinic and will not be stored at home. NOTE: This sheet is a summary. It may not cover all possible information. If you have questions about this medicine, talk to your doctor, pharmacist, or health care provider.  2020 Elsevier/Gold Standard (2015-12-26 14:37:52)  Pertuzumab injection What is this medicine? PERTUZUMAB (per TOOZ ue mab) is a monoclonal antibody. It is used to treat breast cancer. This medicine may be used for other purposes; ask your health care provider or pharmacist if you have questions. COMMON BRAND NAME(S):  PERJETA What should I tell my health care provider before I take this medicine? They need to know if you have any of these conditions:  heart disease  heart failure  high blood pressure  history of irregular heart beat  recent or ongoing radiation therapy  an unusual or allergic reaction to pertuzumab, other medicines, foods, dyes, or preservatives  pregnant or trying to get pregnant  breast-feeding How should I use this medicine? This medicine is for infusion into a vein. It is given by a health care professional in a hospital or clinic setting. Talk to your pediatrician regarding the use of this medicine in children. Special care may be needed. Overdosage: If you think you have taken too much of this medicine contact a poison control center or emergency room at once. NOTE: This medicine is only for you. Do not share this medicine with others. What if I miss a dose? It is important not to miss your dose. Call your doctor or health care professional if you are unable to keep an appointment. What may interact with this medicine? Interactions are not expected. Give your health care provider a list of all the medicines, herbs, non-prescription drugs, or dietary supplements you use. Also tell them if you smoke, drink alcohol, or use illegal drugs. Some items may interact with your medicine. This list may not describe all possible interactions.   Give your health care provider a list of all the medicines, herbs, non-prescription drugs, or dietary supplements you use. Also tell them if you smoke, drink alcohol, or use illegal drugs. Some items may interact with your medicine. What should I watch for while using this medicine? Your condition will be monitored carefully while you are receiving this medicine. Report any side effects. Continue your course of treatment even though you feel ill unless your doctor tells you to stop. Do not become pregnant while taking this medicine or for 7 months after stopping  it. Women should inform their doctor if they wish to become pregnant or think they might be pregnant. Women of child-bearing potential will need to have a negative pregnancy test before starting this medicine. There is a potential for serious side effects to an unborn child. Talk to your health care professional or pharmacist for more information. Do not breast-feed an infant while taking this medicine or for 7 months after stopping it. Women must use effective birth control with this medicine. Call your doctor or health care professional for advice if you get a fever, chills or sore throat, or other symptoms of a cold or flu. Do not treat yourself. Try to avoid being around people who are sick. You may experience fever, chills, and headache during the infusion. Report any side effects during the infusion to your health care professional. What side effects may I notice from receiving this medicine? Side effects that you should report to your doctor or health care professional as soon as possible:  breathing problems  chest pain or palpitations  dizziness  feeling faint or lightheaded  fever or chills  skin rash, itching or hives  sore throat  swelling of the face, lips, or tongue  swelling of the legs or ankles  unusually weak or tired Side effects that usually do not require medical attention (report to your doctor or health care professional if they continue or are bothersome):  diarrhea  hair loss  nausea, vomiting  tiredness This list may not describe all possible side effects. Call your doctor for medical advice about side effects. You may report side effects to FDA at 1-800-FDA-1088. Where should I keep my medicine? This drug is given in a hospital or clinic and will not be stored at home. NOTE: This sheet is a summary. It may not cover all possible information. If you have questions about this medicine, talk to your doctor, pharmacist, or health care provider.  2020  Elsevier/Gold Standard (2015-02-02 12:08:50)  Docetaxel injection What is this medicine? DOCETAXEL (doe se TAX el) is a chemotherapy drug. It targets fast dividing cells, like cancer cells, and causes these cells to die. This medicine is used to treat many types of cancers like breast cancer, certain stomach cancers, head and neck cancer, lung cancer, and prostate cancer. This medicine may be used for other purposes; ask your health care provider or pharmacist if you have questions. COMMON BRAND NAME(S): Docefrez, Taxotere What should I tell my health care provider before I take this medicine? They need to know if you have any of these conditions:  infection (especially a virus infection such as chickenpox, cold sores, or herpes)  liver disease  low blood counts, like low white cell, platelet, or red cell counts  an unusual or allergic reaction to docetaxel, polysorbate 80, other chemotherapy agents, other medicines, foods, dyes, or preservatives  pregnant or trying to get pregnant  breast-feeding How should I use this medicine? This drug is  given as an infusion into a vein. It is administered in a hospital or clinic by a specially trained health care professional. Talk to your pediatrician regarding the use of this medicine in children. Special care may be needed. Overdosage: If you think you have taken too much of this medicine contact a poison control center or emergency room at once. NOTE: This medicine is only for you. Do not share this medicine with others. What if I miss a dose? It is important not to miss your dose. Call your doctor or health care professional if you are unable to keep an appointment. What may interact with this medicine?  aprepitant  certain antibiotics like erythromycin or clarithromycin  certain antivirals for HIV or hepatitis  certain medicines for fungal infections like fluconazole, itraconazole, ketoconazole, posaconazole, or  voriconazole  cimetidine  ciprofloxacin  conivaptan  cyclosporine  dronedarone  fluvoxamine  grapefruit juice  imatinib  verapamil This list may not describe all possible interactions. Give your health care provider a list of all the medicines, herbs, non-prescription drugs, or dietary supplements you use. Also tell them if you smoke, drink alcohol, or use illegal drugs. Some items may interact with your medicine. What should I watch for while using this medicine? Your condition will be monitored carefully while you are receiving this medicine. You will need important blood work done while you are taking this medicine. Call your doctor or health care professional for advice if you get a fever, chills or sore throat, or other symptoms of a cold or flu. Do not treat yourself. This drug decreases your body's ability to fight infections. Try to avoid being around people who are sick. Some products may contain alcohol. Ask your health care professional if this medicine contains alcohol. Be sure to tell all health care professionals you are taking this medicine. Certain medicines, like metronidazole and disulfiram, can cause an unpleasant reaction when taken with alcohol. The reaction includes flushing, headache, nausea, vomiting, sweating, and increased thirst. The reaction can last from 30 minutes to several hours. You may get drowsy or dizzy. Do not drive, use machinery, or do anything that needs mental alertness until you know how this medicine affects you. Do not stand or sit up quickly, especially if you are an older patient. This reduces the risk of dizzy or fainting spells. Alcohol may interfere with the effect of this medicine. Talk to your health care professional about your risk of cancer. You may be more at risk for certain types of cancer if you take this medicine. Do not become pregnant while taking this medicine or for 6 months after stopping it. Women should inform their doctor if  they wish to become pregnant or think they might be pregnant. There is a potential for serious side effects to an unborn child. Talk to your health care professional or pharmacist for more information. Do not breast-feed an infant while taking this medicine or for 1 to 2 weeks after stopping it. Males who get this medicine must use a condom during sex with females who can get pregnant. If you get a woman pregnant, the baby could have birth defects. The baby could die before they are born. You will need to continue wearing a condom for 3 months after stopping the medicine. Tell your health care provider right away if your partner becomes pregnant while you are taking this medicine. This may interfere with the ability to father a child. You should talk to your doctor or health care professional  if you are concerned about your fertility. What side effects may I notice from receiving this medicine? Side effects that you should report to your doctor or health care professional as soon as possible:  allergic reactions like skin rash, itching or hives, swelling of the face, lips, or tongue  blurred vision  breathing problems  changes in vision  low blood counts - This drug may decrease the number of white blood cells, red blood cells and platelets. You may be at increased risk for infections and bleeding.  nausea and vomiting  pain, redness or irritation at site where injected  pain, tingling, numbness in the hands or feet  redness, blistering, peeling, or loosening of the skin, including inside the mouth  signs of decreased platelets or bleeding - bruising, pinpoint red spots on the skin, black, tarry stools, nosebleeds  signs of decreased red blood cells - unusually weak or tired, fainting spells, lightheadedness  signs of infection - fever or chills, cough, sore throat, pain or difficulty passing urine  swelling of the ankle, feet, hands Side effects that usually do not require medical  attention (report to your doctor or health care professional if they continue or are bothersome):  constipation  diarrhea  fingernail or toenail changes  hair loss  loss of appetite  mouth sores  muscle pain This list may not describe all possible side effects. Call your doctor for medical advice about side effects. You may report side effects to FDA at 1-800-FDA-1088. Where should I keep my medicine? This drug is given in a hospital or clinic and will not be stored at home. NOTE: This sheet is a summary. It may not cover all possible information. If you have questions about this medicine, talk to your doctor, pharmacist, or health care provider.  2020 Elsevier/Gold Standard (2018-03-06 12:23:11)  Carboplatin injection What is this medicine? CARBOPLATIN (KAR boe pla tin) is a chemotherapy drug. It targets fast dividing cells, like cancer cells, and causes these cells to die. This medicine is used to treat ovarian cancer and many other cancers. This medicine may be used for other purposes; ask your health care provider or pharmacist if you have questions. COMMON BRAND NAME(S): Paraplatin What should I tell my health care provider before I take this medicine? They need to know if you have any of these conditions:  blood disorders  hearing problems  kidney disease  recent or ongoing radiation therapy  an unusual or allergic reaction to carboplatin, cisplatin, other chemotherapy, other medicines, foods, dyes, or preservatives  pregnant or trying to get pregnant  breast-feeding How should I use this medicine? This drug is usually given as an infusion into a vein. It is administered in a hospital or clinic by a specially trained health care professional. Talk to your pediatrician regarding the use of this medicine in children. Special care may be needed. Overdosage: If you think you have taken too much of this medicine contact a poison control center or emergency room at  once. NOTE: This medicine is only for you. Do not share this medicine with others. What if I miss a dose? It is important not to miss a dose. Call your doctor or health care professional if you are unable to keep an appointment. What may interact with this medicine?  medicines for seizures  medicines to increase blood counts like filgrastim, pegfilgrastim, sargramostim  some antibiotics like amikacin, gentamicin, neomycin, streptomycin, tobramycin  vaccines Talk to your doctor or health care professional before taking any of  these medicines:  acetaminophen  aspirin  ibuprofen  ketoprofen  naproxen This list may not describe all possible interactions. Give your health care provider a list of all the medicines, herbs, non-prescription drugs, or dietary supplements you use. Also tell them if you smoke, drink alcohol, or use illegal drugs. Some items may interact with your medicine. What should I watch for while using this medicine? Your condition will be monitored carefully while you are receiving this medicine. You will need important blood work done while you are taking this medicine. This drug may make you feel generally unwell. This is not uncommon, as chemotherapy can affect healthy cells as well as cancer cells. Report any side effects. Continue your course of treatment even though you feel ill unless your doctor tells you to stop. In some cases, you may be given additional medicines to help with side effects. Follow all directions for their use. Call your doctor or health care professional for advice if you get a fever, chills or sore throat, or other symptoms of a cold or flu. Do not treat yourself. This drug decreases your body's ability to fight infections. Try to avoid being around people who are sick. This medicine may increase your risk to bruise or bleed. Call your doctor or health care professional if you notice any unusual bleeding. Be careful brushing and flossing your  teeth or using a toothpick because you may get an infection or bleed more easily. If you have any dental work done, tell your dentist you are receiving this medicine. Avoid taking products that contain aspirin, acetaminophen, ibuprofen, naproxen, or ketoprofen unless instructed by your doctor. These medicines may hide a fever. Do not become pregnant while taking this medicine. Women should inform their doctor if they wish to become pregnant or think they might be pregnant. There is a potential for serious side effects to an unborn child. Talk to your health care professional or pharmacist for more information. Do not breast-feed an infant while taking this medicine. What side effects may I notice from receiving this medicine? Side effects that you should report to your doctor or health care professional as soon as possible:  allergic reactions like skin rash, itching or hives, swelling of the face, lips, or tongue  signs of infection - fever or chills, cough, sore throat, pain or difficulty passing urine  signs of decreased platelets or bleeding - bruising, pinpoint red spots on the skin, black, tarry stools, nosebleeds  signs of decreased red blood cells - unusually weak or tired, fainting spells, lightheadedness  breathing problems  changes in hearing  changes in vision  chest pain  high blood pressure  low blood counts - This drug may decrease the number of white blood cells, red blood cells and platelets. You may be at increased risk for infections and bleeding.  nausea and vomiting  pain, swelling, redness or irritation at the injection site  pain, tingling, numbness in the hands or feet  problems with balance, talking, walking  trouble passing urine or change in the amount of urine Side effects that usually do not require medical attention (report to your doctor or health care professional if they continue or are bothersome):  hair loss  loss of appetite  metallic taste  in the mouth or changes in taste This list may not describe all possible side effects. Call your doctor for medical advice about side effects. You may report side effects to FDA at 1-800-FDA-1088. Where should I keep my medicine? This drug  is given in a hospital or clinic and will not be stored at home. NOTE: This sheet is a summary. It may not cover all possible information. If you have questions about this medicine, talk to your doctor, pharmacist, or health care provider.  2020 Elsevier/Gold Standard (2007-04-07 14:38:05)

## 2018-11-12 NOTE — Progress Notes (Signed)
Met with patient in infusion to introduce myself as Arboriculturist and to offer available resources.  Discussed one-time $1000 Radio broadcast assistant to assist with personal expenses while going through treatment.  Also, discussed ded/OOP to determine if copay assistance is needed. Advised patient to contact insurance company to find out if she has met everything or may need assistance.  Gave her my card if interested in applying and for any additional financial questions or concerns.

## 2018-11-12 NOTE — Progress Notes (Signed)
Infusion RN notified this RN that patient was experiencing headache, rating pain 3/10.  Patient has Tylenol 650mg  @ approximately 930.   Verbal orders obtained from Dr. Lindi Adie to administer Tylenol 650mg  PO one additional dose.  Infusion nurse notified.

## 2018-11-12 NOTE — Patient Instructions (Signed)

## 2018-11-13 ENCOUNTER — Other Ambulatory Visit: Payer: 59

## 2018-11-13 ENCOUNTER — Telehealth: Payer: Self-pay | Admitting: *Deleted

## 2018-11-13 ENCOUNTER — Encounter: Payer: Self-pay | Admitting: Hematology and Oncology

## 2018-11-13 ENCOUNTER — Telehealth: Payer: Self-pay | Admitting: Hematology and Oncology
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
## 2018-11-13 NOTE — Telephone Encounter (Signed)
-----   Message from Scot Dock, RN sent at 11/13/2018 10:27 AM EDT ----- Regarding: 1st time follow up chemo - tolerated well

## 2018-11-13 NOTE — Progress Notes (Signed)
Patient called regarding [employer] grant and copay assistance.  Based on verbal income guidelines provided, patient is over the income for a household of 1. Advised patient to contact me should anything change.  Patient states she contacted her insurance company and she has met her ded. Asked if she has an OOP to meet as well to determine if insurance will leave her with a coinsurance or not for her treatment. Patient unsure and unsure when plan renews. Advised she may wait until first treatment is billed through insurance to determine whether or not assistance will be needed and also advised EOB will be sent with this information via mail.  She has my card for any additional financial questions or concerns.

## 2018-11-13 NOTE — Telephone Encounter (Signed)
Called pt to see how she did with her treatment yesterday.  She reports h/a when she takes the steroid & took tylenol which helped.  She also states that she felt like her throat was closing up last night & her mouth was very dry.  She is OK this am.  She had trouble sleeping for this reason.  Suggested she call if she develops throat closing again & can take Benadryl.  She states she feels OK this am. Suggested mouth dryness could be from blood sugar being elevated.  Informed that this will go back down after she finished steroid & to watch & drink fluids/water, get some exercise, & check her blood sugar.  If cont to be elevated, she should call her PCP or whomever treats her diabetes.  Message routed to Dr Rolla Plate RN

## 2018-11-13 NOTE — Telephone Encounter (Signed)
No 10/29 LOS  

## 2018-11-14 ENCOUNTER — Inpatient Hospital Stay: Payer: 59

## 2018-11-14 VITALS — BP 154/75 | HR 74 | Temp 98.7°F | Resp 18

## 2018-11-14 DIAGNOSIS — Z5112 Encounter for antineoplastic immunotherapy: Secondary | ICD-10-CM | POA: Diagnosis not present

## 2018-11-14 MED ORDER — PEGFILGRASTIM-JMDB 6 MG/0.6ML ~~LOC~~ SOSY
6.0000 mg | PREFILLED_SYRINGE | Freq: Once | SUBCUTANEOUS | Status: AC
  Administered 2018-11-14: 6 mg via SUBCUTANEOUS

## 2018-11-14 NOTE — Patient Instructions (Signed)

## 2018-11-14 NOTE — Progress Notes (Signed)
Patient came to treatment room for pegfilgrastim injection. States she has had elevated blood sugars at home (350 mg/dL). Also states she feels like her throat "closes up" when she takes her dexamethasone. In Basket message sent to Dr. Lindi Adie. Patient isn't due for another dose of dex until after her next visit with Dr. Lindi Adie.

## 2018-11-16 NOTE — Addendum Note (Signed)
Addended by: Delane Ginger on: 11/16/2018 08:23 AM   Modules accepted: Orders

## 2018-11-17 ENCOUNTER — Telehealth: Payer: Self-pay

## 2018-11-17 NOTE — Telephone Encounter (Signed)
Nutrition Assessment  Reason for Assessment:  Pt attended Breast Clinic on 10/14 and was given nutrition packet by nurse navigator  ASSESSMENT:  58 year old female with left breast cancer.  Patient receiving neoadjuvant chemotherapy with [place] perjeta.  Planning lumpectomy and adjuvant radiation.    Spoke with patient this pm to introduce self and service at [facility].  Patient reports that her appetite is "terrible today".  I have no taste for anything.  Reports that she has gotten a fish plate with fries for lunch and ate few bites of it.  Reports yesterday during the night (at work) had 4 episodes of diarrhea.  Took imodium and has not had any so far today.    Medications:  reviewed  Labs: reviewed  Anthropometrics:   Height: 61 inches Weight: 145 lb 9.6 oz BMI: 27   NUTRITION DIAGNOSIS: Food and nutrition related knowledge deficit related to new diagnosis of breast cancer as evidenced by no prior need for nutrition related information.  INTERVENTION:  Encouraged patient to reach out to MD if symptoms persists.  Has appointment on 11/6.  Discussed briefly foods to consume with diarrhea and strategies to help with nausea.  Patient asking about diabetic oral nutrition supplements and provided examples of shakes.   Encouraged hydration and good sources of protein.  Patient has not had a chance to review nutrition packet of information.  Patient has contact information and will reach out if needed.     MONITORING, EVALUATION, and GOAL: Pt will consume a healthy plant based diet to maintain lean body mass throughout treatment.   Erin Lawson, [place], [place] Registered Dietitian [phone] (pager)

## 2018-11-19 ENCOUNTER — Ambulatory Visit
Admission: RE | Admit: 2018-11-19 | Discharge: 2018-11-19 | Disposition: A | Discharge status: Home / Self Care | Payer: 59 | Source: Ambulatory Visit | Attending: Hematology and Oncology | Admitting: Hematology and Oncology

## 2018-11-19 ENCOUNTER — Telehealth: Payer: Self-pay

## 2018-11-19 ENCOUNTER — Other Ambulatory Visit: Payer: Self-pay | Admitting: Hematology and Oncology

## 2018-11-19 ENCOUNTER — Other Ambulatory Visit: Payer: 59

## 2018-11-19 ENCOUNTER — Other Ambulatory Visit: Payer: Self-pay | Admitting: Diagnostic Radiology

## 2018-11-19 ENCOUNTER — Other Ambulatory Visit: Payer: Self-pay

## 2018-11-19 DIAGNOSIS — R9389 Abnormal findings on diagnostic imaging of other specified body structures: Secondary | ICD-10-CM

## 2018-11-19 MED ORDER — GADOBUTROL 1 MMOL/ML IV SOLN
7.0000 mL | Freq: Once | INTRAVENOUS | Status: AC | PRN
  Administered 2018-11-19: 7 mL via INTRAVENOUS

## 2018-11-19 NOTE — Progress Notes (Signed)
Patient Care Team: Forrest Moron, MD as PCP - General (Internal Medicine) Mauro Kaufmann, RN as Oncology Nurse Navigator Rockwell Germany, RN as Oncology Nurse Navigator Erroll Luna, MD as Consulting Physician (General Surgery) Nicholas Lose, MD as Consulting Physician (Hematology and Oncology) Gery Pray, MD as Consulting Physician (Radiation Oncology)  DIAGNOSIS:    ICD-10-CM   1. Malignant neoplasm of upper-inner quadrant of left breast in female, estrogen receptor negative ([place])  C50.212    Z17.1     SUMMARY OF ONCOLOGIC HISTORY: Oncology History  Malignant neoplasm of upper-inner quadrant of left breast in female, estrogen receptor negative ([place])  10/26/2018 Initial Diagnosis   Patient palpated a left breast mass x 5 weeks. Mammogram showed a 2.8cm mass with calcifications spanning 3.2cm in the left breast and two mildly thickened axillary lymph nodes up to 0.4cm. Biopsy showed IDC, grade 3, HER-2 + (3+), ER/PR -, Ki67 50%, and no evidence of carcinoma in the left axilla.    10/28/2018 Cancer Staging   Staging form: Breast, AJCC 8th Edition - Clinical stage from 10/28/2018: Stage IIA (cT2, cN0, cM0, G2, ER-, PR-, HER2+) - Signed by Nicholas Lose, MD on 10/28/2018   11/12/2018 -  Chemotherapy   The patient had palonosetron (ALOXI) injection 0.25 mg, 0.25 mg, Intravenous,  Once, 1 of 6 cycles Administration: 0.25 mg (11/12/2018) pegfilgrastim (NEULASTA) injection 6 mg, 6 mg, Subcutaneous, Once, 0 of 5 cycles pegfilgrastim-jmdb (FULPHILA) injection 6 mg, 6 mg, Subcutaneous,  Once, 1 of 1 cycle Administration: 6 mg (11/14/2018) CARBOplatin (PARAPLATIN) 500 mg in sodium chloride 0.9 % 250 mL chemo infusion, 500 mg (101.8 % of original dose 493 mg), Intravenous,  Once, 1 of 6 cycles Dose modification:   (original dose 493 mg, Cycle 1, Reason: Provider Judgment) Administration: 500 mg (11/12/2018) DOCEtaxel (TAXOTERE) 110 mg in sodium chloride 0.9 % 250 mL chemo  infusion, 65 mg/m2 = 110 mg (100 % of original dose 65 mg/m2), Intravenous,  Once, 1 of 6 cycles Dose modification: 65 mg/m2 (original dose 65 mg/m2, Cycle 1, Reason: Provider Judgment), 50 mg/m2 (original dose 65 mg/m2, Cycle 2, Reason: Dose not tolerated) Administration: 110 mg (11/12/2018) pertuzumab (PERJETA) 420 mg in sodium chloride 0.9 % 250 mL chemo infusion, 420 mg (100 % of original dose 420 mg), Intravenous, Once, 1 of 6 cycles Dose modification: 420 mg (original dose 420 mg, Cycle 1, Reason: Provider Judgment) Administration: 420 mg (11/12/2018) fosaprepitant (EMEND) 150 mg, dexamethasone (DECADRON) 12 mg in sodium chloride 0.9 % 145 mL IVPB, , Intravenous,  Once, 1 of 6 cycles Administration:  (11/12/2018) trastuzumab-anns (KANJINTI) 399 mg in sodium chloride 0.9 % 250 mL chemo infusion, 6 mg/kg = 399 mg (100 % of original dose 6 mg/kg), Intravenous,  Once, 0 of 5 cycles Dose modification: 6 mg/kg (original dose 6 mg/kg, Cycle 2, Reason: Other (see comments), Comment: J-code in auth referral is for Kanjinti) trastuzumab-dkst (OGIVRI) 525 mg in sodium chloride 0.9 % 250 mL chemo infusion, 8 mg/kg = 525 mg, Intravenous,  Once, 1 of 1 cycle Administration: 525 mg (11/12/2018)  for chemotherapy treatment.      CHIEF COMPLIANT: Cycle 1 Day 8 TCH Perjeta  INTERVAL HISTORY: Erin Lawson is a 58 y.o. with above-mentioned history of left breast cancer currently on neoadjuvant chemotherapy with TCH Perjeta. She presents to the clinic today for a toxicity check following cycle 1.  She had profound nausea vomiting and diarrhea after the chemotherapy. Her blood sugars were also markedly elevated because  of dexamethasone chemo.  REVIEW OF SYSTEMS:   Constitutional: Generalized fatigue and a 5 pound weight loss Eyes: Denies blurriness of vision Ears, nose, mouth, throat, and face: Denies mucositis or sore throat Respiratory: Denies cough, dyspnea or wheezes Cardiovascular: Denies  palpitation, chest discomfort Gastrointestinal: Nausea vomiting diarrhea Skin: Denies abnormal skin rashes Lymphatics: Denies new lymphadenopathy or easy bruising Neurological: Denies numbness, tingling or new weaknesses Behavioral/Psych: Mood is stable, no new changes  Extremities: No lower extremity edema Breast: denies any pain or lumps or nodules in either breasts All other systems were reviewed with the patient and are negative.  I have reviewed the past medical history, past surgical history, social history and family history with the patient and they are unchanged from previous note.  ALLERGIES:  is allergic to atenolol; poractant alfa; pork-derived products; and meloxicam.  MEDICATIONS:  Current Outpatient Medications  Medication Sig Dispense Refill  . amLODipine (NORVASC) 5 MG tablet Take 1 tablet (5 mg total) by mouth daily. 90 tablet 1  . blood glucose meter kit and supplies KIT Dispense based on patient and insurance preference. Use up to four times daily as directed. (FOR ICD-9 250.00, 250.01). 1 each 0  . blood glucose meter kit and supplies Dispense based on patient and insurance preference. Use up to four times daily as directed. (FOR ICD-10 E10.9, E11.9). 1 each 0  . ELDERBERRY PO Take by mouth.    . Ferrous Sulfate (IRON PO) Take by mouth. 1 teaspoon daily    . GARLIC PO Take by mouth.    Marland Kitchen glucose blood (CONTOUR NEXT TEST) test strip Check sugar once daily  Dx:  DMII controlled non-insulin dependent. 100 each 3  . HYDROcodone-acetaminophen (NORCO/VICODIN) 5-325 MG tablet Take 1 tablet by mouth every 6 (six) hours as needed for moderate pain. 15 tablet 0  . LANCETS ULTRA FINE MISC Check sugar up one time daily dx: new onset DMII uncontrolled non-insulin 100 each 3  . lidocaine-prilocaine (EMLA) cream Apply to affected area once 30 g 3  . LORazepam (ATIVAN) 0.5 MG tablet Take 1 tablet (0.5 mg total) by mouth at bedtime as needed for sleep. 30 tablet 0  . metFORMIN  (GLUCOPHAGE) 500 MG tablet Take 1 and half tablet by  Mouth daily for diabetes (Patient taking differently: 750 mg. Take 1 and half tablet by  Mouth daily for diabetes) 90 tablet 1  . Multiple Minerals (CALCIUM/MAGNESIUM/ZINC PO) Take by mouth daily.    . ondansetron (ZOFRAN) 8 MG tablet Take 1 tablet (8 mg total) by mouth 2 (two) times daily as needed (Nausea or vomiting). Begin 4 days after chemotherapy. 30 tablet 1  . prochlorperazine (COMPAZINE) 10 MG tablet Take 1 tablet (10 mg total) by mouth every 6 (six) hours as needed (Nausea or vomiting). 30 tablet 1  . UNABLE TO FIND Med Name: Apple Vinegar Mix - 1 teaspoon daily     No current facility-administered medications for this visit.     PHYSICAL EXAMINATION: ECOG PERFORMANCE STATUS: 1 - Symptomatic but completely ambulatory  Vitals:   11/20/18 1227  BP: 123/65  Pulse: 84  Resp: 17  Temp: 98 F (36.7 C)  SpO2: 98%   Filed Weights   11/20/18 1227  Weight: 141 lb 9.6 oz (64.2 kg)    GENERAL: alert, no distress and comfortable SKIN: skin color, texture, turgor are normal, no rashes or significant lesions EYES: normal, Conjunctiva are pink and non-injected, sclera clear OROPHARYNX: no exudate, no erythema and lips, buccal mucosa, and  tongue normal  NECK: supple, thyroid normal size, non-tender, without nodularity LYMPH: no palpable lymphadenopathy in the cervical, axillary or inguinal LUNGS: clear to auscultation and percussion with normal breathing effort HEART: regular rate & rhythm and no murmurs and no lower extremity edema ABDOMEN: abdomen soft, non-tender and normal bowel sounds MUSCULOSKELETAL: no cyanosis of digits and no clubbing  NEURO: alert & oriented x 3 with fluent speech, no focal motor/sensory deficits EXTREMITIES: No lower extremity edema  LABORATORY DATA:  I have reviewed the data as listed CMP Latest Ref Rng & Units 11/12/2018 10/28/2018 10/12/2018  Glucose 70 - 99 mg/dL 255(H) 234(H) 126(H)  BUN 6 - 20  mg/dL _0 Creatinine 0.44 - 1.00 mg/dL 0.84 0.86 0.68  Sodium 135 - 145 mmol/L 140 139 140  Potassium 3.5 - 5.1 mmol/L 4.2 4.2 4.4  Chloride 98 - 111 mmol/L 106 102 101  CO2 22 - 32 mmol/L 20(L) 24 21  Calcium 8.9 - 10.3 mg/dL 9.8 9.5 10.4(H)  Total Protein 6.5 - 8.1 g/dL 7.2 7.6 7.3  Total Bilirubin 0.3 - 1.2 mg/dL 0.3 0.5 0.3  Alkaline Phos 38 - 126 U/L 97 125 147(H)  AST 15 - 41 U/L _1 ALT 0 - 44 U/L _2 Lab Results  Component Value Date   WBC 44.1 (H) 11/20/2018   HGB 9.9 (L) 11/20/2018   HCT 30.7 (L) 11/20/2018   MCV 71.7 (L) 11/20/2018   PLT 272 11/20/2018   NEUTROABS PENDING 11/20/2018    ASSESSMENT & PLAN:  Malignant neoplasm of upper-inner quadrant of left breast in female, estrogen receptor negative ([place]) 10/26/2018:Patient palpated a left breast mass x 5 weeks. Mammogram showed a 2.8cm mass with calcifications spanning 3.2cm in the left breast and two mildly thickened axillary lymph nodes up to 0.4cm. Biopsy showed IDC, grade 3, HER-2 + (3+), ER/PR -, Ki67 50%, and no evidence of carcinoma in the left axilla.  Treatment plan: 1. Neoadjuvant chemotherapy with TCH Perjeta 6 cycles followed by HerceptinPerjetamaintenance for 1 year 2. Followed by breast conserving surgerywith sentinel lymph node study 3. Followed by adjuvant radiation therapy  ---------------------------------------------------------------------------------------------------------------------------------------------- Current treatment: Cycle 1 day 8 [place] reviewed Echocardiogram: 11/05/2018: EF 65 to 70% Patient is diabetic and we are anxious about her blood sugars.  Chemo toxicities: 1.  Nausea and vomiting: Patient did not take antinausea medicines as we suggested.  Zofran caused her discomfort and therefore she stopped taking it.  But she did not try Compazine.  She was taking Alka-Seltzer.  I instructed her to take Compazine as prescribed for nausea. 2.   Diarrhea: Patient did not take antidiarrheal medications as we suggested. We will discontinue dexamethasone because of elevated blood sugars. We will reduce the dosage of chemotherapy further. IV fluids will be given today for dehydration. Patient has to go to work the next 3 days.  This is stressing her significantly.  3.  Chemotherapy-induced anemia: Monitoring 4.  Leukocytosis secondary to G-CSF Severe anxiety issues: We are connecting her with all the resources that she needs.  Return to clinic in 2 weeks for cycle 2.    No orders of the defined types were placed in this encounter.  The patient has a good understanding of the overall plan. she agrees with it. she will call with any problems that may develop before the next visit here.  Nicholas Lose, MD 11/20/2018  Julious Oka Dorshimer am acting as scribe for Dr. Nicholas Lose.  I have reviewed the above documentation for accuracy and completeness, and I agree with the above.

## 2018-11-19 NOTE — Telephone Encounter (Signed)
RN spoke with patient regarding questions on when to place Emla cream on port for appointments.  RN educated, patient voiced understanding.   Pt reports diarrhea several days ago, with nausea.  Pt reports trying to sip on fluids.  Pt denies effectiveness from anti-emetics , "When I take them I throw them up."  Pt denies any fever, weakness, or confusion.  RN offered evaluation in symptom management clinic.  Pt declined, voiced she is going to try soup and crackers.  RN educated on hydration techniques and use of anti emetics.  RN encouraged pt to keep MD appointment on 11/6 and contact information provided if symptoms continue.

## 2018-11-20 ENCOUNTER — Inpatient Hospital Stay: Payer: 59 | Attending: Hematology and Oncology | Admitting: Hematology and Oncology

## 2018-11-20 ENCOUNTER — Inpatient Hospital Stay: Payer: 59

## 2018-11-20 ENCOUNTER — Other Ambulatory Visit: Payer: Self-pay

## 2018-11-20 DIAGNOSIS — E876 Hypokalemia: Secondary | ICD-10-CM | POA: Diagnosis not present

## 2018-11-20 DIAGNOSIS — R112 Nausea with vomiting, unspecified: Secondary | ICD-10-CM | POA: Diagnosis not present

## 2018-11-20 DIAGNOSIS — C50212 Malignant neoplasm of upper-inner quadrant of left female breast: Secondary | ICD-10-CM

## 2018-11-20 DIAGNOSIS — Z5112 Encounter for antineoplastic immunotherapy: Secondary | ICD-10-CM | POA: Diagnosis present

## 2018-11-20 DIAGNOSIS — D6481 Anemia due to antineoplastic chemotherapy: Secondary | ICD-10-CM | POA: Insufficient documentation

## 2018-11-20 DIAGNOSIS — Z171 Estrogen receptor negative status [ER-]: Secondary | ICD-10-CM

## 2018-11-20 DIAGNOSIS — Z5189 Encounter for other specified aftercare: Secondary | ICD-10-CM | POA: Insufficient documentation

## 2018-11-20 DIAGNOSIS — Z95828 Presence of other vascular implants and grafts: Secondary | ICD-10-CM

## 2018-11-20 DIAGNOSIS — R197 Diarrhea, unspecified: Secondary | ICD-10-CM | POA: Insufficient documentation

## 2018-11-20 DIAGNOSIS — Z5111 Encounter for antineoplastic chemotherapy: Secondary | ICD-10-CM | POA: Diagnosis present

## 2018-11-20 LAB — CBC WITH DIFFERENTIAL (CANCER CENTER ONLY)
Abs Immature Granulocytes: 4.84 10*3/uL — ABNORMAL HIGH (ref 0.00–0.07)
Basophils Absolute: 0 10*3/uL (ref 0.0–0.1)
Basophils Relative: 0 %
Eosinophils Absolute: 0 10*3/uL (ref 0.0–0.5)
Eosinophils Relative: 0 %
HCT: 30.7 % — ABNORMAL LOW (ref 36.0–46.0)
Hemoglobin: 9.9 g/dL — ABNORMAL LOW (ref 12.0–15.0)
Immature Granulocytes: 11 %
Lymphocytes Relative: 13 %
Lymphs Abs: 5.5 10*3/uL — ABNORMAL HIGH (ref 0.7–4.0)
MCH: 23.1 pg — ABNORMAL LOW (ref 26.0–34.0)
MCHC: 32.2 g/dL (ref 30.0–36.0)
MCV: 71.7 fL — ABNORMAL LOW (ref 80.0–100.0)
Monocytes Absolute: 5.1 10*3/uL — ABNORMAL HIGH (ref 0.1–1.0)
Monocytes Relative: 12 %
Neutro Abs: 28.7 10*3/uL — ABNORMAL HIGH (ref 1.7–7.7)
Neutrophils Relative %: 64 %
Platelet Count: 272 10*3/uL (ref 150–400)
RBC: 4.28 MIL/uL (ref 3.87–5.11)
RDW: 13.5 % (ref 11.5–15.5)
WBC Count: 44.1 10*3/uL — ABNORMAL HIGH (ref 4.0–10.5)
nRBC: 0.7 % — ABNORMAL HIGH (ref 0.0–0.2)

## 2018-11-20 LAB — CMP (CANCER CENTER ONLY)
ALT: 17 U/L (ref 0–44)
AST: 29 U/L (ref 15–41)
Albumin: 3.9 g/dL (ref 3.5–5.0)
Alkaline Phosphatase: 193 U/L — ABNORMAL HIGH (ref 38–126)
Anion gap: 13 (ref 5–15)
BUN: 11 mg/dL (ref 6–20)
CO2: 26 mmol/L (ref 22–32)
Calcium: 9.3 mg/dL (ref 8.9–10.3)
Chloride: 100 mmol/L (ref 98–111)
Creatinine: 0.82 mg/dL (ref 0.44–1.00)
GFR, Est AFR Am: >60 mL/min (ref >60)
GFR, Estimated: >60 mL/min (ref >60)
Glucose, Bld: 118 mg/dL — ABNORMAL HIGH (ref 70–99)
Potassium: 3.7 mmol/L (ref 3.5–5.1)
Sodium: 139 mmol/L (ref 135–145)
Total Bilirubin: 0.3 mg/dL (ref 0.3–1.2)
Total Protein: 7 g/dL (ref 6.5–8.1)

## 2018-11-20 MED ORDER — SODIUM CHLORIDE 0.9% FLUSH
10.0000 mL | Freq: Once | INTRAVENOUS | Status: AC
  Administered 2018-11-20: 10 mL via INTRAVENOUS
  Filled 2018-11-20: qty 10

## 2018-11-20 MED ORDER — SODIUM CHLORIDE 0.9 % IV SOLN
INTRAVENOUS | Status: AC
  Administered 2018-11-20: 13:00:00 via INTRAVENOUS
  Filled 2018-11-20 (×2): qty 250

## 2018-11-20 MED ORDER — HEPARIN SOD (PORK) LOCK FLUSH 100 UNIT/ML IV SOLN
500.0000 [IU] | Freq: Once | INTRAVENOUS | Status: DC
  Filled 2018-11-20: qty 5

## 2018-11-20 MED ORDER — SODIUM CHLORIDE 0.9% FLUSH
10.0000 mL | INTRAVENOUS | Status: DC | PRN
  Administered 2018-11-20: 10 mL via INTRAVENOUS
  Filled 2018-11-20: qty 10

## 2018-11-20 MED ORDER — ANTICOAGULANT SODIUM CITRATE 4% (200MG/5ML) IV SOLN
5.0000 mL | Freq: Once | Status: AC
  Administered 2018-11-20: 5 mL via INTRAVENOUS
  Filled 2018-11-20: qty 5

## 2018-11-20 NOTE — Patient Instructions (Signed)

## 2018-11-20 NOTE — Assessment & Plan Note (Signed)
10/26/2018:Patient palpated a left breast mass x 5 weeks. Mammogram showed a 2.8cm mass with calcifications spanning 3.2cm in the left breast and two mildly thickened axillary lymph nodes up to 0.4cm. Biopsy showed IDC, grade 3, HER-2 + (3+), ER/PR -, Ki67 50%, and no evidence of carcinoma in the left axilla.  Treatment plan: 1. Neoadjuvant chemotherapy with TCH Perjeta 6 cycles followed by HerceptinPerjetamaintenance for 1 year 2. Followed by breast conserving surgerywith sentinel lymph node study 3. Followed by adjuvant radiation therapy  ---------------------------------------------------------------------------------------------------------------------------------------------- Current treatment: Cycle 1 day 8 [place] reviewed Echocardiogram: 11/05/2018: EF 65 to 70% Patient is diabetic and we are anxious about her blood sugars.  Chemo toxicities: 1.  Nausea and vomiting 2.  Diarrhea  Severe anxiety issues: We are connecting her with all the resources that she needs.  Return to clinic in 2 weeks for cycle 2.

## 2018-11-20 NOTE — Progress Notes (Signed)
Left patient accessed and made Erin Nations RN aware in the event the patient needed fluids. She will de-access if they decide she doesn't need them today.

## 2018-11-20 NOTE — Patient Instructions (Signed)
Dehydration, Adult  Dehydration is when there is not enough fluid or water in your body. This happens when you lose more fluids than you take in. Dehydration can range from mild to very bad. It should be treated right away to keep it from getting very bad. Symptoms of mild dehydration may include:  Thirst.  Dry lips.  Slightly dry mouth.  Dry, warm skin.  Dizziness. Symptoms of moderate dehydration may include:  Very dry mouth.  Muscle cramps.  Dark pee (urine). Pee may be the color of tea.  Your body making less pee.  Your eyes making fewer tears.  Heartbeat that is uneven or faster than normal (palpitations).  Headache.  Light-headedness, especially when you stand up from sitting.  Fainting (syncope). Symptoms of very bad dehydration may include:  Changes in skin, such as: ? Cold and clammy skin. ? Blotchy (mottled) or pale skin. ? Skin that does not quickly return to normal after being lightly pinched and let go (poor skin turgor).  Changes in body fluids, such as: ? Feeling very thirsty. ? Your eyes making fewer tears. ? Not sweating when body temperature is high, such as in hot weather. ? Your body making very little pee.  Changes in vital signs, such as: ? Weak pulse. ? Pulse that is more than 100 beats a minute when you are sitting still. ? Fast breathing. ? Low blood pressure.  Other changes, such as: ? Sunken eyes. ? Cold hands and feet. ? Confusion. ? Lack of energy (lethargy). ? Trouble waking up from sleep. ? Short-term weight loss. ? Unconsciousness. Follow these instructions at home:   If told by your doctor, drink an ORS: ? Make an ORS by using instructions on the package. ? Start by drinking small amounts, about  cup (120 mL) every 5-10 minutes. ? Slowly drink more until you have had the amount that your doctor said to have.  Drink enough clear fluid to keep your pee clear or pale yellow. If you were told to drink an ORS, finish the  ORS first, then start slowly drinking clear fluids. Drink fluids such as: ? Water. Do not drink only water by itself. Doing that can make the salt (sodium) level in your body get too low (hyponatremia). ? Ice chips. ? Fruit juice that you have added water to (diluted). ? Low-calorie sports drinks.  Avoid: ? Alcohol. ? Drinks that have a lot of sugar. These include high-calorie sports drinks, fruit juice that does not have water added, and soda. ? Caffeine. ? Foods that are greasy or have a lot of fat or sugar.  Take over-the-counter and prescription medicines only as told by your doctor.  Do not take salt tablets. Doing that can make the salt level in your body get too high (hypernatremia).  Eat foods that have minerals (electrolytes). Examples include bananas, oranges, potatoes, tomatoes, and spinach.  Keep all follow-up visits as told by your doctor. This is important. Contact a doctor if:  You have belly (abdominal) pain that: ? Gets worse. ? Stays in one area (localizes).  You have a rash.  You have a stiff neck.  You get angry or annoyed more easily than normal (irritability).  You are more sleepy than normal.  You have a harder time waking up than normal.  You feel: ? Weak. ? Dizzy. ? Very thirsty.  You have peed (urinated) only a small amount of very dark pee during 6-8 hours. Get help right away if:  You have   symptoms of very bad dehydration.  You cannot drink fluids without throwing up (vomiting).  Your symptoms get worse with treatment.  You have a fever.  You have a very bad headache.  You are throwing up or having watery poop (diarrhea) and it: ? Gets worse. ? Does not go away.  You have blood or something green (bile) in your throw-up.  You have blood in your poop (stool). This may cause poop to look black and tarry.  You have not peed in 6-8 hours.  You pass out (faint).  Your heart rate when you are sitting still is more than 100 beats a  minute.  You have trouble breathing. This information is not intended to replace advice given to you by your health care provider. Make sure you discuss any questions you have with your health care provider. Document Released: 10/27/2008 Document Revised: 12/13/2016 Document Reviewed: 02/24/2015 Elsevier Patient Education  2020 [employer]  

## 2018-11-23 NOTE — Telephone Encounter (Signed)
LVM for pt to call office back to follow-up about symptoms.

## 2018-11-23 NOTE — Telephone Encounter (Signed)
Is this still an ongoing issue? I don't see where an appointment was made. If she still has this symptom please make her a same day appointment. Use one of my hospital follow up slots.

## 2018-11-24 ENCOUNTER — Telehealth: Payer: Self-pay | Admitting: *Deleted

## 2018-11-24 NOTE — Telephone Encounter (Signed)
Left message for a return phone call to answer questions patient may have regarding her tx plan.

## 2018-11-26 ENCOUNTER — Other Ambulatory Visit: Payer: 59

## 2018-12-02 NOTE — Progress Notes (Signed)
Patient Care Team: Forrest Moron, MD as PCP - General (Internal Medicine) Mauro Kaufmann, RN as Oncology Nurse Navigator Rockwell Germany, RN as Oncology Nurse Navigator Erroll Luna, MD as Consulting Physician (General Surgery) Nicholas Lose, MD as Consulting Physician (Hematology and Oncology) Gery Pray, MD as Consulting Physician (Radiation Oncology)  DIAGNOSIS:    ICD-10-CM   1. Malignant neoplasm of upper-inner quadrant of left breast in female, estrogen receptor negative ([place])  C50.212    Z17.1     SUMMARY OF ONCOLOGIC HISTORY: Oncology History  Malignant neoplasm of upper-inner quadrant of left breast in female, estrogen receptor negative ([place])  10/26/2018 Initial Diagnosis   Patient palpated a left breast mass x 5 weeks. Mammogram showed a 2.8cm mass with calcifications spanning 3.2cm in the left breast and two mildly thickened axillary lymph nodes up to 0.4cm. Biopsy showed IDC, grade 3, HER-2 + (3+), ER/PR -, Ki67 50%, and no evidence of carcinoma in the left axilla.    10/28/2018 Cancer Staging   Staging form: Breast, AJCC 8th Edition - Clinical stage from 10/28/2018: Stage IIA (cT2, cN0, cM0, G2, ER-, PR-, HER2+) - Signed by Nicholas Lose, MD on 10/28/2018   11/12/2018 -  Chemotherapy   The patient had palonosetron (ALOXI) injection 0.25 mg, 0.25 mg, Intravenous,  Once, 1 of 6 cycles Administration: 0.25 mg (11/12/2018) pegfilgrastim (NEULASTA) injection 6 mg, 6 mg, Subcutaneous, Once, 0 of 5 cycles pegfilgrastim-jmdb (FULPHILA) injection 6 mg, 6 mg, Subcutaneous,  Once, 1 of 1 cycle Administration: 6 mg (11/14/2018) CARBOplatin (PARAPLATIN) 500 mg in sodium chloride 0.9 % 250 mL chemo infusion, 500 mg (101.8 % of original dose 493 mg), Intravenous,  Once, 1 of 6 cycles Dose modification:   (original dose 493 mg, Cycle 1, Reason: Provider Judgment) Administration: 500 mg (11/12/2018) DOCEtaxel (TAXOTERE) 110 mg in sodium chloride 0.9 % 250 mL chemo  infusion, 65 mg/m2 = 110 mg (100 % of original dose 65 mg/m2), Intravenous,  Once, 1 of 6 cycles Dose modification: 65 mg/m2 (original dose 65 mg/m2, Cycle 1, Reason: Provider Judgment), 50 mg/m2 (original dose 65 mg/m2, Cycle 2, Reason: Dose not tolerated) Administration: 110 mg (11/12/2018) pertuzumab (PERJETA) 420 mg in sodium chloride 0.9 % 250 mL chemo infusion, 420 mg (100 % of original dose 420 mg), Intravenous, Once, 1 of 6 cycles Dose modification: 420 mg (original dose 420 mg, Cycle 1, Reason: Provider Judgment) Administration: 420 mg (11/12/2018) fosaprepitant (EMEND) 150 mg, dexamethasone (DECADRON) 12 mg in sodium chloride 0.9 % 145 mL IVPB, , Intravenous,  Once, 1 of 6 cycles Administration:  (11/12/2018) trastuzumab-anns (KANJINTI) 399 mg in sodium chloride 0.9 % 250 mL chemo infusion, 6 mg/kg = 399 mg (100 % of original dose 6 mg/kg), Intravenous,  Once, 0 of 5 cycles Dose modification: 6 mg/kg (original dose 6 mg/kg, Cycle 2, Reason: Other (see comments), Comment: J-code in auth referral is for Kanjinti) trastuzumab-dkst (OGIVRI) 525 mg in sodium chloride 0.9 % 250 mL chemo infusion, 8 mg/kg = 525 mg, Intravenous,  Once, 1 of 1 cycle Administration: 525 mg (11/12/2018)  for chemotherapy treatment.      CHIEF COMPLIANT: Cycle 2 TCH Perjeta  INTERVAL HISTORY: Erin Lawson is a 58 y.o. with above-mentioned history of left breast cancer currently on neoadjuvant chemotherapy with TCH Perjeta. She presents to the clinic todayfor cycle 2.  She has had intermittent diarrhea 3-4 times per day but she is able to use Imodium and stop it.  Energy levels have fully recovered.  She is still working full-time.  She has noticed hair loss.  REVIEW OF SYSTEMS:   Constitutional: Denies fevers, chills or abnormal weight loss Eyes: Denies blurriness of vision Ears, nose, mouth, throat, and face: Denies mucositis or sore throat Respiratory: Denies cough, dyspnea or wheezes  Cardiovascular: Denies palpitation, chest discomfort Gastrointestinal: Denies nausea, heartburn or change in bowel habits Skin: Denies abnormal skin rashes Lymphatics: Denies new lymphadenopathy or easy bruising Neurological: Denies numbness, tingling or new weaknesses Behavioral/Psych: Mood is stable, no new changes  Extremities: No lower extremity edema Breast: denies any pain or lumps or nodules in either breasts All other systems were reviewed with the patient and are negative.  I have reviewed the past medical history, past surgical history, social history and family history with the patient and they are unchanged from previous note.  ALLERGIES:  is allergic to atenolol; poractant alfa; pork-derived products; and meloxicam.  MEDICATIONS:  Current Outpatient Medications  Medication Sig Dispense Refill  . amLODipine (NORVASC) 5 MG tablet Take 1 tablet (5 mg total) by mouth daily. 90 tablet 1  . blood glucose meter kit and supplies KIT Dispense based on patient and insurance preference. Use up to four times daily as directed. (FOR ICD-9 250.00, 250.01). 1 each 0  . blood glucose meter kit and supplies Dispense based on patient and insurance preference. Use up to four times daily as directed. (FOR ICD-10 E10.9, E11.9). 1 each 0  . ELDERBERRY PO Take by mouth.    . Ferrous Sulfate (IRON PO) Take by mouth. 1 teaspoon daily    . GARLIC PO Take by mouth.    Marland Kitchen glucose blood (CONTOUR NEXT TEST) test strip Check sugar once daily  Dx:  DMII controlled non-insulin dependent. 100 each 3  . HYDROcodone-acetaminophen (NORCO/VICODIN) 5-325 MG tablet Take 1 tablet by mouth every 6 (six) hours as needed for moderate pain. 15 tablet 0  . LANCETS ULTRA FINE MISC Check sugar up one time daily dx: new onset DMII uncontrolled non-insulin 100 each 3  . lidocaine-prilocaine (EMLA) cream Apply to affected area once 30 g 3  . LORazepam (ATIVAN) 0.5 MG tablet Take 1 tablet (0.5 mg total) by mouth at bedtime as  needed for sleep. 30 tablet 0  . metFORMIN (GLUCOPHAGE) 500 MG tablet Take 1 and half tablet by  Mouth daily for diabetes (Patient taking differently: 750 mg. Take 1 and half tablet by  Mouth daily for diabetes) 90 tablet 1  . Multiple Minerals (CALCIUM/MAGNESIUM/ZINC PO) Take by mouth daily.    . ondansetron (ZOFRAN) 8 MG tablet Take 1 tablet (8 mg total) by mouth 2 (two) times daily as needed (Nausea or vomiting). Begin 4 days after chemotherapy. 30 tablet 1  . prochlorperazine (COMPAZINE) 10 MG tablet Take 1 tablet (10 mg total) by mouth every 6 (six) hours as needed (Nausea or vomiting). 30 tablet 1  . UNABLE TO FIND Med Name: Apple Vinegar Mix - 1 teaspoon daily     No current facility-administered medications for this visit.     PHYSICAL EXAMINATION: ECOG PERFORMANCE STATUS: 1 - Symptomatic but completely ambulatory  Vitals:   12/03/18 0956  BP: (!) 159/70  Pulse: 75  Resp: 17  Temp: 98 F (36.7 C)  SpO2: 99%   Filed Weights   12/03/18 0956  Weight: 141 lb 8 oz (64.2 kg)    GENERAL: alert, no distress and comfortable SKIN: skin color, texture, turgor are normal, no rashes or significant lesions EYES: normal, Conjunctiva are pink and  non-injected, sclera clear OROPHARYNX: no exudate, no erythema and lips, buccal mucosa, and tongue normal  NECK: supple, thyroid normal size, non-tender, without nodularity LYMPH: no palpable lymphadenopathy in the cervical, axillary or inguinal LUNGS: clear to auscultation and percussion with normal breathing effort HEART: regular rate & rhythm and no murmurs and no lower extremity edema ABDOMEN: abdomen soft, non-tender and normal bowel sounds MUSCULOSKELETAL: no cyanosis of digits and no clubbing  NEURO: alert & oriented x 3 with fluent speech, no focal motor/sensory deficits EXTREMITIES: No lower extremity edema  LABORATORY DATA:  I have reviewed the data as listed CMP Latest Ref Rng & Units 11/20/2018 11/12/2018 10/28/2018  Glucose 70  - 99 mg/dL 118(H) 255(H) 234(H)  BUN 6 - 20 mg/dL '11 17 13  ' Creatinine 0.44 - 1.00 mg/dL 0.82 0.84 0.86  Sodium 135 - 145 mmol/L 139 140 139  Potassium 3.5 - 5.1 mmol/L 3.7 4.2 4.2  Chloride 98 - 111 mmol/L 100 106 102  CO2 22 - 32 mmol/L 26 20(L) 24  Calcium 8.9 - 10.3 mg/dL 9.3 9.8 9.5  Total Protein 6.5 - 8.1 g/dL 7.0 7.2 7.6  Total Bilirubin 0.3 - 1.2 mg/dL 0.3 0.3 0.5  Alkaline Phos 38 - 126 U/L 193(H) 97 125  AST 15 - 41 U/L '29 15 19  ' ALT 0 - 44 U/L '17 22 24    ' Lab Results  Component Value Date   WBC 9.4 12/03/2018   HGB 8.6 (L) 12/03/2018   HCT 27.7 (L) 12/03/2018   MCV 74.3 (L) 12/03/2018   PLT 634 (H) 12/03/2018   NEUTROABS 5.4 12/03/2018    ASSESSMENT & PLAN:  Malignant neoplasm of upper-inner quadrant of left breast in female, estrogen receptor negative ([place]) 10/26/2018:Patient palpated a left breast mass x 5 weeks. Mammogram showed a 2.8cm mass with calcifications spanning 3.2cm in the left breast and two mildly thickened axillary lymph nodes up to 0.4cm. Biopsy showed IDC, grade 3, HER-2 + (3+), ER/PR -, Ki67 50%, and no evidence of carcinoma in the left axilla.  Treatment plan: 1. Neoadjuvant chemotherapy with TCH Perjeta 6 cycles followed by HerceptinPerjetamaintenance for 1 year 2. Followed by breast conserving surgerywith sentinel lymph node study 3. Followed by adjuvant radiation therapy ---------------------------------------------------------------------------------------------------------------------------------------------- Current treatment: Cycle 1 day 8 [place] reviewed Echocardiogram: 11/05/2018: EF 65 to 70% Patient is diabetic and we are anxious about her blood sugars.  Chemo toxicities: 1.  Nausea and vomiting: Resolved 2.  Diarrhea: Improved after taking Imodium as prescribed. 3.  Chemotherapy-induced anemia: We will check iron studies and ferritin today. 4.  Leukocytosis secondary to G-CSF  Severe anxiety issues:  She  appears to be doing much better today. 5.  Hypokalemia potassium 3.0 today.  This is probably due to diarrhea.  We will start her on 20 mg orally daily and will give 40 mEq orally in the infusion.   Return to clinic in 3 weeks for cycle 3   No orders of the defined types were placed in this encounter.  The patient has a good understanding of the overall plan. she agrees with it. she will call with any problems that may develop before the next visit here.  Nicholas Lose, MD 12/03/2018  Julious Oka Dorshimer, am acting as scribe for Dr. Nicholas Lose.  I have reviewed the above documentation for accuracy and completeness, and I agree with the above.

## 2018-12-03 ENCOUNTER — Inpatient Hospital Stay: Payer: 59

## 2018-12-03 ENCOUNTER — Encounter: Payer: Self-pay | Admitting: General Practice

## 2018-12-03 ENCOUNTER — Other Ambulatory Visit: Payer: Self-pay

## 2018-12-03 ENCOUNTER — Inpatient Hospital Stay (HOSPITAL_BASED_OUTPATIENT_CLINIC_OR_DEPARTMENT_OTHER): Payer: 59 | Admitting: Hematology and Oncology

## 2018-12-03 VITALS — BP 113/58 | HR 52 | Temp 98.7°F | Resp 16

## 2018-12-03 VITALS — BP 159/70 | HR 75 | Temp 98.0°F | Resp 17 | Ht 61.0 in | Wt 141.5 lb

## 2018-12-03 DIAGNOSIS — Z171 Estrogen receptor negative status [ER-]: Secondary | ICD-10-CM | POA: Diagnosis not present

## 2018-12-03 DIAGNOSIS — D509 Iron deficiency anemia, unspecified: Secondary | ICD-10-CM | POA: Diagnosis not present

## 2018-12-03 DIAGNOSIS — C50212 Malignant neoplasm of upper-inner quadrant of left female breast: Secondary | ICD-10-CM

## 2018-12-03 DIAGNOSIS — Z95828 Presence of other vascular implants and grafts: Secondary | ICD-10-CM

## 2018-12-03 DIAGNOSIS — Z5112 Encounter for antineoplastic immunotherapy: Secondary | ICD-10-CM | POA: Diagnosis not present

## 2018-12-03 LAB — CBC WITH DIFFERENTIAL (CANCER CENTER ONLY)
Abs Immature Granulocytes: 0.07 10*3/uL (ref 0.00–0.07)
Basophils Absolute: 0.1 10*3/uL (ref 0.0–0.1)
Basophils Relative: 1 %
Eosinophils Absolute: 0 10*3/uL (ref 0.0–0.5)
Eosinophils Relative: 0 %
HCT: 27.7 % — ABNORMAL LOW (ref 36.0–46.0)
Hemoglobin: 8.6 g/dL — ABNORMAL LOW (ref 12.0–15.0)
Immature Granulocytes: 1 %
Lymphocytes Relative: 33 %
Lymphs Abs: 3.1 10*3/uL (ref 0.7–4.0)
MCH: 23.1 pg — ABNORMAL LOW (ref 26.0–34.0)
MCHC: 31 g/dL (ref 30.0–36.0)
MCV: 74.3 fL — ABNORMAL LOW (ref 80.0–100.0)
Monocytes Absolute: 0.7 10*3/uL (ref 0.1–1.0)
Monocytes Relative: 7 %
Neutro Abs: 5.4 10*3/uL (ref 1.7–7.7)
Neutrophils Relative %: 58 %
Platelet Count: 634 10*3/uL — ABNORMAL HIGH (ref 150–400)
RBC: 3.73 MIL/uL — ABNORMAL LOW (ref 3.87–5.11)
RDW: 14.7 % (ref 11.5–15.5)
WBC Count: 9.4 10*3/uL (ref 4.0–10.5)
nRBC: 0 % (ref 0.0–0.2)

## 2018-12-03 LAB — CMP (CANCER CENTER ONLY)
ALT: 23 U/L (ref 0–44)
AST: 21 U/L (ref 15–41)
Albumin: 3.7 g/dL (ref 3.5–5.0)
Alkaline Phosphatase: 115 U/L (ref 38–126)
Anion gap: 10 (ref 5–15)
BUN: 8 mg/dL (ref 6–20)
CO2: 26 mmol/L (ref 22–32)
Calcium: 8.8 mg/dL — ABNORMAL LOW (ref 8.9–10.3)
Chloride: 105 mmol/L (ref 98–111)
Creatinine: 0.74 mg/dL (ref 0.44–1.00)
GFR, Est AFR Am: >60 mL/min (ref >60)
GFR, Estimated: >60 mL/min (ref >60)
Glucose, Bld: 136 mg/dL — ABNORMAL HIGH (ref 70–99)
Potassium: 3 mmol/L — CL (ref 3.5–5.1)
Sodium: 141 mmol/L (ref 135–145)
Total Bilirubin: 0.3 mg/dL (ref 0.3–1.2)
Total Protein: 6.2 g/dL — ABNORMAL LOW (ref 6.5–8.1)

## 2018-12-03 LAB — IRON AND TIBC
Iron: 50 ug/dL (ref 41–142)
Saturation Ratios: 16 % — ABNORMAL LOW (ref 21–57)
TIBC: 309 ug/dL (ref 236–444)
UIBC: 259 ug/dL (ref 120–384)

## 2018-12-03 LAB — FERRITIN: Ferritin: 516 ng/mL — ABNORMAL HIGH (ref 11–307)

## 2018-12-03 MED ORDER — DIPHENHYDRAMINE HCL 25 MG PO CAPS
ORAL_CAPSULE | ORAL | Status: AC
  Filled 2018-12-03: qty 2

## 2018-12-03 MED ORDER — SODIUM CHLORIDE 0.9% FLUSH
10.0000 mL | INTRAVENOUS | Status: DC | PRN
  Administered 2018-12-03: 10:00:00 10 mL via INTRAVENOUS
  Filled 2018-12-03: qty 10

## 2018-12-03 MED ORDER — SODIUM CHLORIDE 0.9 % IV SOLN
50.0000 mg/m2 | Freq: Once | INTRAVENOUS | Status: AC
  Administered 2018-12-03: 80 mg via INTRAVENOUS
  Filled 2018-12-03: qty 8

## 2018-12-03 MED ORDER — SODIUM CHLORIDE 0.9 % IV SOLN
420.0000 mg | Freq: Once | INTRAVENOUS | Status: AC
  Administered 2018-12-03: 420 mg via INTRAVENOUS
  Filled 2018-12-03: qty 14

## 2018-12-03 MED ORDER — POTASSIUM CHLORIDE CRYS ER 20 MEQ PO TBCR
40.0000 meq | EXTENDED_RELEASE_TABLET | Freq: Once | ORAL | Status: AC
  Administered 2018-12-03: 40 meq via ORAL

## 2018-12-03 MED ORDER — SODIUM CHLORIDE 0.9 % IV SOLN
Freq: Once | INTRAVENOUS | Status: AC
  Administered 2018-12-03: 11:00:00 via INTRAVENOUS
  Filled 2018-12-03: qty 5

## 2018-12-03 MED ORDER — HEPARIN SOD (PORK) LOCK FLUSH 100 UNIT/ML IV SOLN
500.0000 [IU] | Freq: Once | INTRAVENOUS | Status: AC | PRN
  Administered 2018-12-03: 500 [IU]
  Filled 2018-12-03: qty 5

## 2018-12-03 MED ORDER — PALONOSETRON HCL INJECTION 0.25 MG/5ML
INTRAVENOUS | Status: AC
  Filled 2018-12-03: qty 5

## 2018-12-03 MED ORDER — DIPHENHYDRAMINE HCL 25 MG PO CAPS
50.0000 mg | ORAL_CAPSULE | Freq: Once | ORAL | Status: AC
  Administered 2018-12-03: 50 mg via ORAL

## 2018-12-03 MED ORDER — TRASTUZUMAB-ANNS CHEMO 150 MG IV SOLR
6.0000 mg/kg | Freq: Once | INTRAVENOUS | Status: AC
  Administered 2018-12-03: 399 mg via INTRAVENOUS
  Filled 2018-12-03: qty 19

## 2018-12-03 MED ORDER — ACETAMINOPHEN 325 MG PO TABS
ORAL_TABLET | ORAL | Status: AC
  Filled 2018-12-03: qty 2

## 2018-12-03 MED ORDER — POTASSIUM CHLORIDE CRYS ER 20 MEQ PO TBCR: 20.0000 meq | EXTENDED_RELEASE_TABLET | Freq: Two times a day (BID) | ORAL | 3 refills | Status: DC

## 2018-12-03 MED ORDER — SODIUM CHLORIDE 0.9 % IV SOLN
420.0000 mg | Freq: Once | INTRAVENOUS | Status: AC
  Administered 2018-12-03: 420 mg via INTRAVENOUS
  Filled 2018-12-03: qty 42

## 2018-12-03 MED ORDER — PALONOSETRON HCL INJECTION 0.25 MG/5ML
0.2500 mg | Freq: Once | INTRAVENOUS | Status: AC
  Administered 2018-12-03: 0.25 mg via INTRAVENOUS

## 2018-12-03 MED ORDER — SODIUM CHLORIDE 0.9% FLUSH
10.0000 mL | INTRAVENOUS | Status: DC | PRN
  Administered 2018-12-03: 10 mL
  Filled 2018-12-03: qty 10

## 2018-12-03 MED ORDER — ACETAMINOPHEN 325 MG PO TABS
650.0000 mg | ORAL_TABLET | Freq: Once | ORAL | Status: AC
  Administered 2018-12-03: 650 mg via ORAL

## 2018-12-03 MED ORDER — POTASSIUM CHLORIDE CRYS ER 20 MEQ PO TBCR
EXTENDED_RELEASE_TABLET | ORAL | Status: AC
  Filled 2018-12-03: qty 2

## 2018-12-03 MED ORDER — SODIUM CHLORIDE 0.9 % IV SOLN
Freq: Once | INTRAVENOUS | Status: AC
  Administered 2018-12-03: 11:00:00 via INTRAVENOUS
  Filled 2018-12-03: qty 250

## 2018-12-03 NOTE — Progress Notes (Signed)
[facility] Note  Met Neziah in person in infusion as planned. She was very welcoming of chaplain and engaged in insightful conversation about herself. We built rapport and shared pastoral reflection. She has a lot of self-awareness that is helping her cope and make meaning within her cancer experience. Church, confidence in her medical team, and the upbeat (not isolating) atmosphere of the infusion room are addtional sources of meaning and hope. Because she works at night and sleeps during the day, we plan to f/u at a future treatment.   [name], [place], [facility] Pager [phone] Voicemail [phone]

## 2018-12-03 NOTE — Progress Notes (Signed)
Pt potassium 3.0 per Dr. Lindi Adie okay to treat pt.  Pt will receive 40 mEq p.o today in infusion and a script has also been called to her pharmacy.  Infusion RN Karen Chafe notified and will educate pt on taking oral K at home.

## 2018-12-03 NOTE — Assessment & Plan Note (Signed)
10/26/2018:Patient palpated a left breast mass x 5 weeks. Mammogram showed a 2.8cm mass with calcifications spanning 3.2cm in the left breast and two mildly thickened axillary lymph nodes up to 0.4cm. Biopsy showed IDC, grade 3, HER-2 + (3+), ER/PR -, Ki67 50%, and no evidence of carcinoma in the left axilla.  Treatment plan: 1. Neoadjuvant chemotherapy with TCH Perjeta 6 cycles followed by HerceptinPerjetamaintenance for 1 year 2. Followed by breast conserving surgerywith sentinel lymph node study 3. Followed by adjuvant radiation therapy ---------------------------------------------------------------------------------------------------------------------------------------------- Current treatment: Cycle 1 day 8 [place] reviewed Echocardiogram: 11/05/2018: EF 65 to 70% Patient is diabetic and we are anxious about her blood sugars.  Chemo toxicities: 1.  Nausea and vomiting: Patient did not take antinausea medicines as we suggested.  Zofran caused her discomfort and therefore she stopped taking it.  But she did not try Compazine.  She was taking Alka-Seltzer.  I instructed her to take Compazine as prescribed for nausea. 2.  Diarrhea: Patient did not take antidiarrheal medications as we suggested. 3.  Chemotherapy-induced anemia: Monitoring 4.  Leukocytosis secondary to G-CSF Severe anxiety issues: We are connecting her with all the resources that she needs.  Return to clinic in 1 week for IV fluids Return to clinic in 3 weeks for cycle 3

## 2018-12-03 NOTE — Patient Instructions (Signed)
[facility] Discharge Instructions for Patients Receiving Chemotherapy  Today you received the following chemotherapy agents: Trastuzumab, Pertuzumab, Docetaxel, Carboplatin  To help prevent nausea and vomiting after your treatment, we encourage you to take your nausea medication as directed.   If you develop nausea and vomiting that is not controlled by your nausea medication, call the clinic.   BELOW ARE SYMPTOMS THAT SHOULD BE REPORTED IMMEDIATELY:  *FEVER GREATER THAN 100.5 F  *CHILLS WITH OR WITHOUT FEVER  NAUSEA AND VOMITING THAT IS NOT CONTROLLED WITH YOUR NAUSEA MEDICATION  *UNUSUAL SHORTNESS OF BREATH  *UNUSUAL BRUISING OR BLEEDING  TENDERNESS IN MOUTH AND THROAT WITH OR WITHOUT PRESENCE OF ULCERS  *URINARY PROBLEMS  *BOWEL PROBLEMS  UNUSUAL RASH Items with * indicate a potential emergency and should be followed up as soon as possible.  Feel free to call the clinic should you have any questions or concerns. The clinic phone number is [phone].  Please show the [facility] at check-in to the Emergency Department and triage nurse.  Trastuzumab injection for infusion What is this medicine? TRASTUZUMAB (tras TOO zoo mab) is a monoclonal antibody. It is used to treat breast cancer and stomach cancer. This medicine may be used for other purposes; ask your health care provider or pharmacist if you have questions. COMMON BRAND NAME(S): Herceptin, Galvin Proffer, Trazimera What should I tell my health care provider before I take this medicine? They need to know if you have any of these conditions:  heart disease  heart failure  lung or breathing disease, like asthma  an unusual or allergic reaction to trastuzumab, benzyl alcohol, or other medications, foods, dyes, or preservatives  pregnant or trying to get pregnant  breast-feeding How should I use this medicine? This drug is given as an infusion into a vein. It is  administered in a hospital or clinic by a specially trained health care professional. Talk to your pediatrician regarding the use of this medicine in children. This medicine is not approved for use in children. Overdosage: If you think you have taken too much of this medicine contact a poison control center or emergency room at once. NOTE: This medicine is only for you. Do not share this medicine with others. What if I miss a dose? It is important not to miss a dose. Call your doctor or health care professional if you are unable to keep an appointment. What may interact with this medicine? This medicine may interact with the following medications:  certain types of chemotherapy, such as daunorubicin, doxorubicin, epirubicin, and idarubicin This list may not describe all possible interactions. Give your health care provider a list of all the medicines, herbs, non-prescription drugs, or dietary supplements you use. Also tell them if you smoke, drink alcohol, or use illegal drugs. Some items may interact with your medicine. What should I watch for while using this medicine? Visit your doctor for checks on your progress. Report any side effects. Continue your course of treatment even though you feel ill unless your doctor tells you to stop. Call your doctor or health care professional for advice if you get a fever, chills or sore throat, or other symptoms of a cold or flu. Do not treat yourself. Try to avoid being around people who are sick. You may experience fever, chills and shaking during your first infusion. These effects are usually mild and can be treated with other medicines. Report any side effects during the infusion to your health care professional.  Fever and chills usually do not happen with later infusions. Do not become pregnant while taking this medicine or for 7 months after stopping it. Women should inform their doctor if they wish to become pregnant or think they might be pregnant.  Women of child-bearing potential will need to have a negative pregnancy test before starting this medicine. There is a potential for serious side effects to an unborn child. Talk to your health care professional or pharmacist for more information. Do not breast-feed an infant while taking this medicine or for 7 months after stopping it. Women must use effective birth control with this medicine. What side effects may I notice from receiving this medicine? Side effects that you should report to your doctor or health care professional as soon as possible:  allergic reactions like skin rash, itching or hives, swelling of the face, lips, or tongue  chest pain or palpitations  cough  dizziness  feeling faint or lightheaded, falls  fever  general ill feeling or flu-like symptoms  signs of worsening heart failure like breathing problems; swelling in your legs and feet  unusually weak or tired Side effects that usually do not require medical attention (report to your doctor or health care professional if they continue or are bothersome):  bone pain  changes in taste  diarrhea  joint pain  nausea/vomiting  weight loss This list may not describe all possible side effects. Call your doctor for medical advice about side effects. You may report side effects to FDA at 1-800-FDA-1088. Where should I keep my medicine? This drug is given in a hospital or clinic and will not be stored at home. NOTE: This sheet is a summary. It may not cover all possible information. If you have questions about this medicine, talk to your doctor, pharmacist, or health care provider.  2020 Elsevier/Gold Standard (2015-12-26 14:37:52)  Pertuzumab injection What is this medicine? PERTUZUMAB (per TOOZ ue mab) is a monoclonal antibody. It is used to treat breast cancer. This medicine may be used for other purposes; ask your health care provider or pharmacist if you have questions. COMMON BRAND NAME(S):  PERJETA What should I tell my health care provider before I take this medicine? They need to know if you have any of these conditions:  heart disease  heart failure  high blood pressure  history of irregular heart beat  recent or ongoing radiation therapy  an unusual or allergic reaction to pertuzumab, other medicines, foods, dyes, or preservatives  pregnant or trying to get pregnant  breast-feeding How should I use this medicine? This medicine is for infusion into a vein. It is given by a health care professional in a hospital or clinic setting. Talk to your pediatrician regarding the use of this medicine in children. Special care may be needed. Overdosage: If you think you have taken too much of this medicine contact a poison control center or emergency room at once. NOTE: This medicine is only for you. Do not share this medicine with others. What if I miss a dose? It is important not to miss your dose. Call your doctor or health care professional if you are unable to keep an appointment. What may interact with this medicine? Interactions are not expected. Give your health care provider a list of all the medicines, herbs, non-prescription drugs, or dietary supplements you use. Also tell them if you smoke, drink alcohol, or use illegal drugs. Some items may interact with your medicine. This list may not describe all possible interactions.   Give your health care provider a list of all the medicines, herbs, non-prescription drugs, or dietary supplements you use. Also tell them if you smoke, drink alcohol, or use illegal drugs. Some items may interact with your medicine. What should I watch for while using this medicine? Your condition will be monitored carefully while you are receiving this medicine. Report any side effects. Continue your course of treatment even though you feel ill unless your doctor tells you to stop. Do not become pregnant while taking this medicine or for 7 months after stopping  it. Women should inform their doctor if they wish to become pregnant or think they might be pregnant. Women of child-bearing potential will need to have a negative pregnancy test before starting this medicine. There is a potential for serious side effects to an unborn child. Talk to your health care professional or pharmacist for more information. Do not breast-feed an infant while taking this medicine or for 7 months after stopping it. Women must use effective birth control with this medicine. Call your doctor or health care professional for advice if you get a fever, chills or sore throat, or other symptoms of a cold or flu. Do not treat yourself. Try to avoid being around people who are sick. You may experience fever, chills, and headache during the infusion. Report any side effects during the infusion to your health care professional. What side effects may I notice from receiving this medicine? Side effects that you should report to your doctor or health care professional as soon as possible:  breathing problems  chest pain or palpitations  dizziness  feeling faint or lightheaded  fever or chills  skin rash, itching or hives  sore throat  swelling of the face, lips, or tongue  swelling of the legs or ankles  unusually weak or tired Side effects that usually do not require medical attention (report to your doctor or health care professional if they continue or are bothersome):  diarrhea  hair loss  nausea, vomiting  tiredness This list may not describe all possible side effects. Call your doctor for medical advice about side effects. You may report side effects to FDA at 1-800-FDA-1088. Where should I keep my medicine? This drug is given in a hospital or clinic and will not be stored at home. NOTE: This sheet is a summary. It may not cover all possible information. If you have questions about this medicine, talk to your doctor, pharmacist, or health care provider.  2020  Elsevier/Gold Standard (2015-02-02 12:08:50)  Docetaxel injection What is this medicine? DOCETAXEL (doe se TAX el) is a chemotherapy drug. It targets fast dividing cells, like cancer cells, and causes these cells to die. This medicine is used to treat many types of cancers like breast cancer, certain stomach cancers, head and neck cancer, lung cancer, and prostate cancer. This medicine may be used for other purposes; ask your health care provider or pharmacist if you have questions. COMMON BRAND NAME(S): Docefrez, Taxotere What should I tell my health care provider before I take this medicine? They need to know if you have any of these conditions:  infection (especially a virus infection such as chickenpox, cold sores, or herpes)  liver disease  low blood counts, like low white cell, platelet, or red cell counts  an unusual or allergic reaction to docetaxel, polysorbate 80, other chemotherapy agents, other medicines, foods, dyes, or preservatives  pregnant or trying to get pregnant  breast-feeding How should I use this medicine? This drug is  given as an infusion into a vein. It is administered in a hospital or clinic by a specially trained health care professional. Talk to your pediatrician regarding the use of this medicine in children. Special care may be needed. Overdosage: If you think you have taken too much of this medicine contact a poison control center or emergency room at once. NOTE: This medicine is only for you. Do not share this medicine with others. What if I miss a dose? It is important not to miss your dose. Call your doctor or health care professional if you are unable to keep an appointment. What may interact with this medicine?  aprepitant  certain antibiotics like erythromycin or clarithromycin  certain antivirals for HIV or hepatitis  certain medicines for fungal infections like fluconazole, itraconazole, ketoconazole, posaconazole, or  voriconazole  cimetidine  ciprofloxacin  conivaptan  cyclosporine  dronedarone  fluvoxamine  grapefruit juice  imatinib  verapamil This list may not describe all possible interactions. Give your health care provider a list of all the medicines, herbs, non-prescription drugs, or dietary supplements you use. Also tell them if you smoke, drink alcohol, or use illegal drugs. Some items may interact with your medicine. What should I watch for while using this medicine? Your condition will be monitored carefully while you are receiving this medicine. You will need important blood work done while you are taking this medicine. Call your doctor or health care professional for advice if you get a fever, chills or sore throat, or other symptoms of a cold or flu. Do not treat yourself. This drug decreases your body's ability to fight infections. Try to avoid being around people who are sick. Some products may contain alcohol. Ask your health care professional if this medicine contains alcohol. Be sure to tell all health care professionals you are taking this medicine. Certain medicines, like metronidazole and disulfiram, can cause an unpleasant reaction when taken with alcohol. The reaction includes flushing, headache, nausea, vomiting, sweating, and increased thirst. The reaction can last from 30 minutes to several hours. You may get drowsy or dizzy. Do not drive, use machinery, or do anything that needs mental alertness until you know how this medicine affects you. Do not stand or sit up quickly, especially if you are an older patient. This reduces the risk of dizzy or fainting spells. Alcohol may interfere with the effect of this medicine. Talk to your health care professional about your risk of cancer. You may be more at risk for certain types of cancer if you take this medicine. Do not become pregnant while taking this medicine or for 6 months after stopping it. Women should inform their doctor if  they wish to become pregnant or think they might be pregnant. There is a potential for serious side effects to an unborn child. Talk to your health care professional or pharmacist for more information. Do not breast-feed an infant while taking this medicine or for 1 to 2 weeks after stopping it. Males who get this medicine must use a condom during sex with females who can get pregnant. If you get a woman pregnant, the baby could have birth defects. The baby could die before they are born. You will need to continue wearing a condom for 3 months after stopping the medicine. Tell your health care provider right away if your partner becomes pregnant while you are taking this medicine. This may interfere with the ability to father a child. You should talk to your doctor or health care professional  if you are concerned about your fertility. What side effects may I notice from receiving this medicine? Side effects that you should report to your doctor or health care professional as soon as possible:  allergic reactions like skin rash, itching or hives, swelling of the face, lips, or tongue  blurred vision  breathing problems  changes in vision  low blood counts - This drug may decrease the number of white blood cells, red blood cells and platelets. You may be at increased risk for infections and bleeding.  nausea and vomiting  pain, redness or irritation at site where injected  pain, tingling, numbness in the hands or feet  redness, blistering, peeling, or loosening of the skin, including inside the mouth  signs of decreased platelets or bleeding - bruising, pinpoint red spots on the skin, black, tarry stools, nosebleeds  signs of decreased red blood cells - unusually weak or tired, fainting spells, lightheadedness  signs of infection - fever or chills, cough, sore throat, pain or difficulty passing urine  swelling of the ankle, feet, hands Side effects that usually do not require medical  attention (report to your doctor or health care professional if they continue or are bothersome):  constipation  diarrhea  fingernail or toenail changes  hair loss  loss of appetite  mouth sores  muscle pain This list may not describe all possible side effects. Call your doctor for medical advice about side effects. You may report side effects to FDA at 1-800-FDA-1088. Where should I keep my medicine? This drug is given in a hospital or clinic and will not be stored at home. NOTE: This sheet is a summary. It may not cover all possible information. If you have questions about this medicine, talk to your doctor, pharmacist, or health care provider.  2020 Elsevier/Gold Standard (2018-03-06 12:23:11)  Carboplatin injection What is this medicine? CARBOPLATIN (KAR boe pla tin) is a chemotherapy drug. It targets fast dividing cells, like cancer cells, and causes these cells to die. This medicine is used to treat ovarian cancer and many other cancers. This medicine may be used for other purposes; ask your health care provider or pharmacist if you have questions. COMMON BRAND NAME(S): Paraplatin What should I tell my health care provider before I take this medicine? They need to know if you have any of these conditions:  blood disorders  hearing problems  kidney disease  recent or ongoing radiation therapy  an unusual or allergic reaction to carboplatin, cisplatin, other chemotherapy, other medicines, foods, dyes, or preservatives  pregnant or trying to get pregnant  breast-feeding How should I use this medicine? This drug is usually given as an infusion into a vein. It is administered in a hospital or clinic by a specially trained health care professional. Talk to your pediatrician regarding the use of this medicine in children. Special care may be needed. Overdosage: If you think you have taken too much of this medicine contact a poison control center or emergency room at  once. NOTE: This medicine is only for you. Do not share this medicine with others. What if I miss a dose? It is important not to miss a dose. Call your doctor or health care professional if you are unable to keep an appointment. What may interact with this medicine?  medicines for seizures  medicines to increase blood counts like filgrastim, pegfilgrastim, sargramostim  some antibiotics like amikacin, gentamicin, neomycin, streptomycin, tobramycin  vaccines Talk to your doctor or health care professional before taking any of  these medicines:  acetaminophen  aspirin  ibuprofen  ketoprofen  naproxen This list may not describe all possible interactions. Give your health care provider a list of all the medicines, herbs, non-prescription drugs, or dietary supplements you use. Also tell them if you smoke, drink alcohol, or use illegal drugs. Some items may interact with your medicine. What should I watch for while using this medicine? Your condition will be monitored carefully while you are receiving this medicine. You will need important blood work done while you are taking this medicine. This drug may make you feel generally unwell. This is not uncommon, as chemotherapy can affect healthy cells as well as cancer cells. Report any side effects. Continue your course of treatment even though you feel ill unless your doctor tells you to stop. In some cases, you may be given additional medicines to help with side effects. Follow all directions for their use. Call your doctor or health care professional for advice if you get a fever, chills or sore throat, or other symptoms of a cold or flu. Do not treat yourself. This drug decreases your body's ability to fight infections. Try to avoid being around people who are sick. This medicine may increase your risk to bruise or bleed. Call your doctor or health care professional if you notice any unusual bleeding. Be careful brushing and flossing your  teeth or using a toothpick because you may get an infection or bleed more easily. If you have any dental work done, tell your dentist you are receiving this medicine. Avoid taking products that contain aspirin, acetaminophen, ibuprofen, naproxen, or ketoprofen unless instructed by your doctor. These medicines may hide a fever. Do not become pregnant while taking this medicine. Women should inform their doctor if they wish to become pregnant or think they might be pregnant. There is a potential for serious side effects to an unborn child. Talk to your health care professional or pharmacist for more information. Do not breast-feed an infant while taking this medicine. What side effects may I notice from receiving this medicine? Side effects that you should report to your doctor or health care professional as soon as possible:  allergic reactions like skin rash, itching or hives, swelling of the face, lips, or tongue  signs of infection - fever or chills, cough, sore throat, pain or difficulty passing urine  signs of decreased platelets or bleeding - bruising, pinpoint red spots on the skin, black, tarry stools, nosebleeds  signs of decreased red blood cells - unusually weak or tired, fainting spells, lightheadedness  breathing problems  changes in hearing  changes in vision  chest pain  high blood pressure  low blood counts - This drug may decrease the number of white blood cells, red blood cells and platelets. You may be at increased risk for infections and bleeding.  nausea and vomiting  pain, swelling, redness or irritation at the injection site  pain, tingling, numbness in the hands or feet  problems with balance, talking, walking  trouble passing urine or change in the amount of urine Side effects that usually do not require medical attention (report to your doctor or health care professional if they continue or are bothersome):  hair loss  loss of appetite  metallic taste  in the mouth or changes in taste This list may not describe all possible side effects. Call your doctor for medical advice about side effects. You may report side effects to FDA at 1-800-FDA-1088. Where should I keep my medicine? This drug  is given in a hospital or clinic and will not be stored at home. NOTE: This sheet is a summary. It may not cover all possible information. If you have questions about this medicine, talk to your doctor, pharmacist, or health care provider.  2020 Elsevier/Gold Standard (2007-04-07 14:38:05)

## 2018-12-05 ENCOUNTER — Inpatient Hospital Stay: Payer: 59

## 2018-12-05 VITALS — BP 142/70 | HR 90 | Temp 98.3°F | Resp 20

## 2018-12-05 DIAGNOSIS — C50212 Malignant neoplasm of upper-inner quadrant of left female breast: Secondary | ICD-10-CM

## 2018-12-05 DIAGNOSIS — Z171 Estrogen receptor negative status [ER-]: Secondary | ICD-10-CM

## 2018-12-05 DIAGNOSIS — Z5112 Encounter for antineoplastic immunotherapy: Secondary | ICD-10-CM | POA: Diagnosis not present

## 2018-12-05 MED ORDER — PEGFILGRASTIM INJECTION 6 MG/0.6ML ~~LOC~~
6.0000 mg | PREFILLED_SYRINGE | Freq: Once | SUBCUTANEOUS | Status: AC
  Administered 2018-12-05: 6 mg via SUBCUTANEOUS

## 2018-12-05 NOTE — Patient Instructions (Signed)

## 2018-12-11 ENCOUNTER — Other Ambulatory Visit: Payer: Self-pay | Admitting: Hematology and Oncology

## 2018-12-22 NOTE — Progress Notes (Signed)
Patient Care Team: Forrest Moron, MD as PCP - General (Internal Medicine) Mauro Kaufmann, RN as Oncology Nurse Navigator Rockwell Germany, RN as Oncology Nurse Navigator Erroll Luna, MD as Consulting Physician (General Surgery) Nicholas Lose, MD as Consulting Physician (Hematology and Oncology) Gery Pray, MD as Consulting Physician (Radiation Oncology)  DIAGNOSIS:    ICD-10-CM   1. Malignant neoplasm of upper-inner quadrant of left breast in female, estrogen receptor negative ([place])  C50.212    Z17.1     SUMMARY OF ONCOLOGIC HISTORY: Oncology History  Malignant neoplasm of upper-inner quadrant of left breast in female, estrogen receptor negative ([place])  10/26/2018 Initial Diagnosis   Patient palpated a left breast mass x 5 weeks. Mammogram showed a 2.8cm mass with calcifications spanning 3.2cm in the left breast and two mildly thickened axillary lymph nodes up to 0.4cm. Biopsy showed IDC, grade 3, HER-2 + (3+), ER/PR -, Ki67 50%, and no evidence of carcinoma in the left axilla.    10/28/2018 Cancer Staging   Staging form: Breast, AJCC 8th Edition - Clinical stage from 10/28/2018: Stage IIA (cT2, cN0, cM0, G2, ER-, PR-, HER2+) - Signed by Nicholas Lose, MD on 10/28/2018   11/12/2018 -  Chemotherapy   The patient had palonosetron (ALOXI) injection 0.25 mg, 0.25 mg, Intravenous,  Once, 2 of 6 cycles Administration: 0.25 mg (11/12/2018), 0.25 mg (12/03/2018) pegfilgrastim (NEULASTA) injection 6 mg, 6 mg, Subcutaneous, Once, 1 of 5 cycles Administration: 6 mg (12/05/2018) pegfilgrastim-jmdb (FULPHILA) injection 6 mg, 6 mg, Subcutaneous,  Once, 1 of 1 cycle Administration: 6 mg (11/14/2018) CARBOplatin (PARAPLATIN) 500 mg in sodium chloride 0.9 % 250 mL chemo infusion, 500 mg (101.8 % of original dose 493 mg), Intravenous,  Once, 2 of 6 cycles Dose modification:   (original dose 493 mg, Cycle 1, Reason: Provider Judgment) Administration: 500 mg (11/12/2018), 420 mg  (12/03/2018) DOCEtaxel (TAXOTERE) 110 mg in sodium chloride 0.9 % 250 mL chemo infusion, 65 mg/m2 = 110 mg (100 % of original dose 65 mg/m2), Intravenous,  Once, 2 of 6 cycles Dose modification: 65 mg/m2 (original dose 65 mg/m2, Cycle 1, Reason: Provider Judgment), 50 mg/m2 (original dose 65 mg/m2, Cycle 2, Reason: Dose not tolerated), 40 mg/m2 (original dose 65 mg/m2, Cycle 5, Reason: Dose not tolerated) Administration: 110 mg (11/12/2018), 80 mg (12/03/2018) pertuzumab (PERJETA) 420 mg in sodium chloride 0.9 % 250 mL chemo infusion, 420 mg (100 % of original dose 420 mg), Intravenous, Once, 2 of 6 cycles Dose modification: 420 mg (original dose 420 mg, Cycle 1, Reason: Provider Judgment) Administration: 420 mg (11/12/2018), 420 mg (12/03/2018) fosaprepitant (EMEND) 150 mg, dexamethasone (DECADRON) 12 mg in sodium chloride 0.9 % 145 mL IVPB, , Intravenous,  Once, 2 of 6 cycles Administration:  (11/12/2018),  (12/03/2018) trastuzumab-anns (KANJINTI) 399 mg in sodium chloride 0.9 % 250 mL chemo infusion, 6 mg/kg = 399 mg (100 % of original dose 6 mg/kg), Intravenous,  Once, 1 of 5 cycles Dose modification: 6 mg/kg (original dose 6 mg/kg, Cycle 2, Reason: Other (see comments), Comment: J-code in auth referral is for Kanjinti) Administration: 399 mg (12/03/2018) trastuzumab-dkst (OGIVRI) 525 mg in sodium chloride 0.9 % 250 mL chemo infusion, 8 mg/kg = 525 mg, Intravenous,  Once, 1 of 1 cycle Administration: 525 mg (11/12/2018)  for chemotherapy treatment.      CHIEF COMPLIANT: Cycle 3TCH Perjeta  INTERVAL HISTORY: Erin Lawson is a 58 y.o. with above-mentioned history of left breast cancer currently on neoadjuvant chemotherapy with TCH Perjeta.She presents  to the clinic todayforcycle 3.    REVIEW OF SYSTEMS:   Constitutional: Denies fevers, chills or abnormal weight loss Eyes: Denies blurriness of vision Ears, nose, mouth, throat, and face: Denies mucositis or sore throat  Respiratory: Denies cough, dyspnea or wheezes Cardiovascular: Denies palpitation, chest discomfort Gastrointestinal: Denies nausea, heartburn or change in bowel habits Skin: Denies abnormal skin rashes Lymphatics: Denies new lymphadenopathy or easy bruising Neurological: Denies numbness, tingling or new weaknesses Behavioral/Psych: Mood is stable, no new changes  Extremities: No lower extremity edema Breast: denies any pain or lumps or nodules in either breasts All other systems were reviewed with the patient and are negative.  I have reviewed the past medical history, past surgical history, social history and family history with the patient and they are unchanged from previous note.  ALLERGIES:  is allergic to atenolol; poractant alfa; pork-derived products; and meloxicam.  MEDICATIONS:  Current Outpatient Medications  Medication Sig Dispense Refill  . amLODipine (NORVASC) 5 MG tablet Take 1 tablet (5 mg total) by mouth daily. 90 tablet 1  . blood glucose meter kit and supplies KIT Dispense based on patient and insurance preference. Use up to four times daily as directed. (FOR ICD-9 250.00, 250.01). 1 each 0  . blood glucose meter kit and supplies Dispense based on patient and insurance preference. Use up to four times daily as directed. (FOR ICD-10 E10.9, E11.9). 1 each 0  . Ferrous Sulfate (IRON PO) Take by mouth. 1 teaspoon daily    . glucose blood (CONTOUR NEXT TEST) test strip Check sugar once daily  Dx:  DMII controlled non-insulin dependent. 100 each 3  . KLOR-CON M20 20 MEQ tablet TAKE 1 TABLET BY MOUTH TWICE A DAY 30 tablet 3  . LANCETS ULTRA FINE MISC Check sugar up one time daily dx: new onset DMII uncontrolled non-insulin 100 each 3  . lidocaine-prilocaine (EMLA) cream Apply to affected area once 30 g 3  . LORazepam (ATIVAN) 0.5 MG tablet Take 1 tablet (0.5 mg total) by mouth at bedtime as needed for sleep. 30 tablet 0  . metFORMIN (GLUCOPHAGE) 500 MG tablet Take 1 and half  tablet by  Mouth daily for diabetes (Patient taking differently: 750 mg. Take 1 and half tablet by  Mouth daily for diabetes) 90 tablet 1  . ondansetron (ZOFRAN) 8 MG tablet Take 1 tablet (8 mg total) by mouth 2 (two) times daily as needed (Nausea or vomiting). Begin 4 days after chemotherapy. 30 tablet 1  . prochlorperazine (COMPAZINE) 10 MG tablet Take 1 tablet (10 mg total) by mouth every 6 (six) hours as needed (Nausea or vomiting). 30 tablet 1   No current facility-administered medications for this visit.     PHYSICAL EXAMINATION: ECOG PERFORMANCE STATUS: 1 - Symptomatic but completely ambulatory  Vitals:   12/23/18 0830  BP: (!) 148/68  Pulse: 72  Resp: 19  Temp: 97.6 F (36.4 C)  SpO2: 100%   Filed Weights   12/23/18 0830  Weight: 138 lb 12.8 oz (63 kg)    GENERAL: alert, no distress and comfortable SKIN: skin color, texture, turgor are normal, no rashes or significant lesions EYES: normal, Conjunctiva are pink and non-injected, sclera clear OROPHARYNX: no exudate, no erythema and lips, buccal mucosa, and tongue normal  NECK: supple, thyroid normal size, non-tender, without nodularity LYMPH: no palpable lymphadenopathy in the cervical, axillary or inguinal LUNGS: clear to auscultation and percussion with normal breathing effort HEART: regular rate & rhythm and no murmurs and no lower extremity  edema ABDOMEN: abdomen soft, non-tender and normal bowel sounds MUSCULOSKELETAL: no cyanosis of digits and no clubbing  NEURO: alert & oriented x 3 with fluent speech, no focal motor/sensory deficits EXTREMITIES: No lower extremity edema  LABORATORY DATA:  I have reviewed the data as listed CMP Latest Ref Rng & Units 12/03/2018 11/20/2018 11/12/2018  Glucose 70 - 99 mg/dL 136(H) 118(H) 255(H)  BUN 6 - 20 mg/dL _0 Creatinine 0.44 - 1.00 mg/dL 0.74 0.82 0.84  Sodium 135 - 145 mmol/L 141 139 140  Potassium 3.5 - 5.1 mmol/L 3.0(LL) 3.7 4.2  Chloride 98 - 111 mmol/L 105 100  106  CO2 22 - 32 mmol/L 26 26 20(L)  Calcium 8.9 - 10.3 mg/dL 8.8(L) 9.3 9.8  Total Protein 6.5 - 8.1 g/dL 6.2(L) 7.0 7.2  Total Bilirubin 0.3 - 1.2 mg/dL 0.3 0.3 0.3  Alkaline Phos 38 - 126 U/L 115 193(H) 97  AST 15 - 41 U/L _1 ALT 0 - 44 U/L _2 Lab Results  Component Value Date   WBC 11.3 (H) 12/23/2018   HGB 8.4 (L) 12/23/2018   HCT 27.1 (L) 12/23/2018   MCV 75.9 (L) 12/23/2018   PLT 360 12/23/2018   NEUTROABS 7.5 12/23/2018    ASSESSMENT & PLAN:  Malignant neoplasm of upper-inner quadrant of left breast in female, estrogen receptor negative ([place]) 10/26/2018:Patient palpated a left breast mass x 5 weeks. Mammogram showed a 2.8cm mass with calcifications spanning 3.2cm in the left breast and two mildly thickened axillary lymph nodes up to 0.4cm. Biopsy showed IDC, grade 3, HER-2 + (3+), ER/PR -, Ki67 50%, and no evidence of carcinoma in the left axilla.  Treatment plan: 1. Neoadjuvant chemotherapy with TCH Perjeta 6 cycles followed by HerceptinPerjetamaintenance for 1 year 2. Followed by breast conserving surgerywith sentinel lymph node study 3. Followed by adjuvant radiation therapy ---------------------------------------------------------------------------------------------------------------------------------------------- Current treatment: Cycle 3 [place] reviewed Echocardiogram: 11/05/2018: EF 65 to 70% Patient is diabetic and we are anxious about her blood sugars.  Chemo toxicities: 1.Nausea and vomiting: Resolved 2.Diarrhea: Improved after taking Imodium as prescribed. 3.Chemotherapy-induced anemia: I encouraged her to eat iron-containing foods.  She may need blood transfusion if her hemoglobin drops below 8. 4.Leukocytosis secondary to G-CSF: We discussed the pros and cons of stopping the growth factor injection.  We decided to maintain it for the time being. Severe anxiety issues: She appears to be doing much better today.  5.  Hypokalemia due to diarrhea.  We will start her on 20 mg orally daily    Return to clinic in 3 weeks for cycle 4  No orders of the defined types were placed in this encounter.  The patient has a good understanding of the overall plan. she agrees with it. she will call with any problems that may develop before the next visit here.  Nicholas Lose, MD 12/23/2018  Erin Lawson, am acting as scribe for Dr. Nicholas Lose.  I have reviewed the above documentation for accuracy and completeness, and I agree with the above.

## 2018-12-23 ENCOUNTER — Other Ambulatory Visit: Payer: Self-pay

## 2018-12-23 ENCOUNTER — Inpatient Hospital Stay (HOSPITAL_BASED_OUTPATIENT_CLINIC_OR_DEPARTMENT_OTHER): Payer: 59 | Admitting: Hematology and Oncology

## 2018-12-23 ENCOUNTER — Inpatient Hospital Stay: Payer: 59 | Attending: Hematology and Oncology

## 2018-12-23 ENCOUNTER — Encounter: Payer: Self-pay | Admitting: General Practice

## 2018-12-23 ENCOUNTER — Inpatient Hospital Stay: Payer: 59

## 2018-12-23 VITALS — BP 113/63 | HR 61 | Temp 98.6°F | Resp 20

## 2018-12-23 DIAGNOSIS — Z171 Estrogen receptor negative status [ER-]: Secondary | ICD-10-CM

## 2018-12-23 DIAGNOSIS — D6481 Anemia due to antineoplastic chemotherapy: Secondary | ICD-10-CM | POA: Diagnosis not present

## 2018-12-23 DIAGNOSIS — E119 Type 2 diabetes mellitus without complications: Secondary | ICD-10-CM | POA: Insufficient documentation

## 2018-12-23 DIAGNOSIS — Z5189 Encounter for other specified aftercare: Secondary | ICD-10-CM | POA: Insufficient documentation

## 2018-12-23 DIAGNOSIS — C50212 Malignant neoplasm of upper-inner quadrant of left female breast: Secondary | ICD-10-CM | POA: Diagnosis present

## 2018-12-23 DIAGNOSIS — E876 Hypokalemia: Secondary | ICD-10-CM | POA: Insufficient documentation

## 2018-12-23 DIAGNOSIS — Z5112 Encounter for antineoplastic immunotherapy: Secondary | ICD-10-CM | POA: Diagnosis present

## 2018-12-23 DIAGNOSIS — Z5111 Encounter for antineoplastic chemotherapy: Secondary | ICD-10-CM | POA: Diagnosis present

## 2018-12-23 DIAGNOSIS — Z95828 Presence of other vascular implants and grafts: Secondary | ICD-10-CM

## 2018-12-23 LAB — CMP (CANCER CENTER ONLY)
ALT: 12 U/L (ref 0–44)
AST: 15 U/L (ref 15–41)
Albumin: 3.8 g/dL (ref 3.5–5.0)
Alkaline Phosphatase: 139 U/L — ABNORMAL HIGH (ref 38–126)
Anion gap: 8 (ref 5–15)
BUN: 11 mg/dL (ref 6–20)
CO2: 25 mmol/L (ref 22–32)
Calcium: 8.9 mg/dL (ref 8.9–10.3)
Chloride: 107 mmol/L (ref 98–111)
Creatinine: 0.74 mg/dL (ref 0.44–1.00)
GFR, Est AFR Am: >60 mL/min (ref >60)
GFR, Estimated: >60 mL/min (ref >60)
Glucose, Bld: 121 mg/dL — ABNORMAL HIGH (ref 70–99)
Potassium: 3.9 mmol/L (ref 3.5–5.1)
Sodium: 140 mmol/L (ref 135–145)
Total Bilirubin: 0.3 mg/dL (ref 0.3–1.2)
Total Protein: 6.4 g/dL — ABNORMAL LOW (ref 6.5–8.1)

## 2018-12-23 LAB — CBC WITH DIFFERENTIAL (CANCER CENTER ONLY)
Abs Immature Granulocytes: 0.09 10*3/uL — ABNORMAL HIGH (ref 0.00–0.07)
Basophils Absolute: 0.1 10*3/uL (ref 0.0–0.1)
Basophils Relative: 0 %
Eosinophils Absolute: 0 10*3/uL (ref 0.0–0.5)
Eosinophils Relative: 0 %
HCT: 27.1 % — ABNORMAL LOW (ref 36.0–46.0)
Hemoglobin: 8.4 g/dL — ABNORMAL LOW (ref 12.0–15.0)
Immature Granulocytes: 1 %
Lymphocytes Relative: 24 %
Lymphs Abs: 2.8 10*3/uL (ref 0.7–4.0)
MCH: 23.5 pg — ABNORMAL LOW (ref 26.0–34.0)
MCHC: 31 g/dL (ref 30.0–36.0)
MCV: 75.9 fL — ABNORMAL LOW (ref 80.0–100.0)
Monocytes Absolute: 0.9 10*3/uL (ref 0.1–1.0)
Monocytes Relative: 8 %
Neutro Abs: 7.5 10*3/uL (ref 1.7–7.7)
Neutrophils Relative %: 67 %
Platelet Count: 360 10*3/uL (ref 150–400)
RBC: 3.57 MIL/uL — ABNORMAL LOW (ref 3.87–5.11)
RDW: 16.8 % — ABNORMAL HIGH (ref 11.5–15.5)
WBC Count: 11.3 10*3/uL — ABNORMAL HIGH (ref 4.0–10.5)
nRBC: 0 % (ref 0.0–0.2)

## 2018-12-23 MED ORDER — TRASTUZUMAB-ANNS CHEMO 150 MG IV SOLR
6.0000 mg/kg | Freq: Once | INTRAVENOUS | Status: AC
  Administered 2018-12-23: 399 mg via INTRAVENOUS
  Filled 2018-12-23: qty 19

## 2018-12-23 MED ORDER — ACETAMINOPHEN 325 MG PO TABS
650.0000 mg | ORAL_TABLET | Freq: Once | ORAL | Status: AC
  Administered 2018-12-23: 650 mg via ORAL

## 2018-12-23 MED ORDER — PALONOSETRON HCL INJECTION 0.25 MG/5ML
0.2500 mg | Freq: Once | INTRAVENOUS | Status: AC
  Administered 2018-12-23: 0.25 mg via INTRAVENOUS

## 2018-12-23 MED ORDER — SODIUM CHLORIDE 0.9 % IV SOLN
40.0000 mg/m2 | Freq: Once | INTRAVENOUS | Status: AC
  Administered 2018-12-23: 70 mg via INTRAVENOUS
  Filled 2018-12-23: qty 7

## 2018-12-23 MED ORDER — DIPHENHYDRAMINE HCL 25 MG PO CAPS
ORAL_CAPSULE | ORAL | Status: AC
  Filled 2018-12-23: qty 2

## 2018-12-23 MED ORDER — ANTICOAGULANT SODIUM CITRATE 4% (200MG/5ML) IV SOLN
5.0000 mL | Status: DC | PRN
  Administered 2018-12-23: 5 mL via INTRAVENOUS
  Filled 2018-12-23: qty 5

## 2018-12-23 MED ORDER — SODIUM CHLORIDE 0.9 % IV SOLN
416.4000 mg | Freq: Once | INTRAVENOUS | Status: AC
  Administered 2018-12-23: 13:00:00 420 mg via INTRAVENOUS
  Filled 2018-12-23: qty 42

## 2018-12-23 MED ORDER — ACETAMINOPHEN 325 MG PO TABS
ORAL_TABLET | ORAL | Status: AC
  Filled 2018-12-23: qty 2

## 2018-12-23 MED ORDER — DIPHENHYDRAMINE HCL 25 MG PO CAPS
50.0000 mg | ORAL_CAPSULE | Freq: Once | ORAL | Status: AC
  Administered 2018-12-23: 50 mg via ORAL

## 2018-12-23 MED ORDER — SODIUM CHLORIDE 0.9 % IV SOLN
Freq: Once | INTRAVENOUS | Status: AC
  Administered 2018-12-23: 09:00:00 via INTRAVENOUS
  Filled 2018-12-23: qty 250

## 2018-12-23 MED ORDER — SODIUM CHLORIDE 0.9 % IV SOLN
Freq: Once | INTRAVENOUS | Status: AC
  Administered 2018-12-23: 09:00:00 via INTRAVENOUS
  Filled 2018-12-23: qty 5

## 2018-12-23 MED ORDER — SODIUM CHLORIDE 0.9% FLUSH
10.0000 mL | INTRAVENOUS | Status: DC | PRN
  Administered 2018-12-23: 10 mL
  Filled 2018-12-23: qty 10

## 2018-12-23 MED ORDER — SODIUM CHLORIDE 0.9 % IV SOLN
420.0000 mg | Freq: Once | INTRAVENOUS | Status: AC
  Administered 2018-12-23: 420 mg via INTRAVENOUS
  Filled 2018-12-23: qty 14

## 2018-12-23 MED ORDER — PALONOSETRON HCL INJECTION 0.25 MG/5ML
INTRAVENOUS | Status: AC
  Filled 2018-12-23: qty 5

## 2018-12-23 NOTE — Assessment & Plan Note (Signed)
10/26/2018:Patient palpated a left breast mass x 5 weeks. Mammogram showed a 2.8cm mass with calcifications spanning 3.2cm in the left breast and two mildly thickened axillary lymph nodes up to 0.4cm. Biopsy showed IDC, grade 3, HER-2 + (3+), ER/PR -, Ki67 50%, and no evidence of carcinoma in the left axilla.  Treatment plan: 1. Neoadjuvant chemotherapy with TCH Perjeta 6 cycles followed by HerceptinPerjetamaintenance for 1 year 2. Followed by breast conserving surgerywith sentinel lymph node study 3. Followed by adjuvant radiation therapy ---------------------------------------------------------------------------------------------------------------------------------------------- Current treatment: Cycle 3 Erin Lawson reviewed Echocardiogram: 11/05/2018: EF 65 to 70% Patient is diabetic and we are anxious about her blood sugars.  Chemo toxicities: 1.Nausea and vomiting: Resolved 2.Diarrhea: Improved after taking Imodium as prescribed. 3.Chemotherapy-induced anemia: We will check iron studies and ferritin today. 4.Leukocytosis secondary to G-CSF  Severe anxiety issues: She appears to be doing much better today. 5.  Hypokalemia potassium 3.0 today.  This is probably due to diarrhea.  We will start her on 20 mg orally daily and will give 40 mEq orally in the infusion.   Return to clinic in 3 weeks for cycle 4

## 2018-12-23 NOTE — Progress Notes (Signed)
[facility] Spiritual Care Note  Attempted f/u visit in infusion, but Erin Lawson was sleeping. Left my pager number with Erin Lawson/RN in case Erin Lawson and would like a visit if time allows. Will also put her next treatment on my calendar since that is her ideal time for contact.   [place], [place], [facility] Pager [phone] Voicemail [phone]

## 2018-12-23 NOTE — Patient Instructions (Signed)
[facility] Discharge Instructions for Patients Receiving Chemotherapy  Today you received the following Immunotherapy: Trastuzumab, Pertuzumab, and Chemotherapy: Docetaxel, Carboplatin  To help prevent nausea and vomiting after your treatment, we encourage you to take your nausea medication as directed by your MD.   If you develop nausea and vomiting that is not controlled by your nausea medication, call the clinic.   BELOW ARE SYMPTOMS THAT SHOULD BE REPORTED IMMEDIATELY:  *FEVER GREATER THAN 100.5 F  *CHILLS WITH OR WITHOUT FEVER  NAUSEA AND VOMITING THAT IS NOT CONTROLLED WITH YOUR NAUSEA MEDICATION  *UNUSUAL SHORTNESS OF BREATH  *UNUSUAL BRUISING OR BLEEDING  TENDERNESS IN MOUTH AND THROAT WITH OR WITHOUT PRESENCE OF ULCERS  *URINARY PROBLEMS  *BOWEL PROBLEMS  UNUSUAL RASH Items with * indicate a potential emergency and should be followed up as soon as possible.  Feel free to call the clinic should you have any questions or concerns. The clinic phone number is [phone].  Please show the [facility] at check-in to the Emergency Department and triage nurse. Coronavirus (COVID-19) Are you at risk?  Are you at risk for the Coronavirus (COVID-19)?  To be considered HIGH RISK for Coronavirus (COVID-19), you have to meet the following criteria:  . Traveled to [place], [place], [place], [place] or [place]; or in the [place] to [place], [place], [place], or [place]; and have fever, cough, and shortness of breath within the last 2 weeks of travel OR . Been in close contact with a person diagnosed with COVID-19 within the last 2 weeks and have fever, cough, and shortness of breath . IF YOU DO NOT MEET THESE CRITERIA, YOU ARE CONSIDERED LOW RISK FOR COVID-19.  What to do if you are HIGH RISK for COVID-19?  Marland Kitchen If you are having a medical emergency, call 911. . Seek medical care right away. Before you go to a doctor's office, urgent care or  emergency department, call ahead and tell them about your recent travel, contact with someone diagnosed with COVID-19, and your symptoms. You should receive instructions from your physician's office regarding next steps of care.  . When you arrive at healthcare provider, tell the healthcare staff immediately you have returned from visiting [place], [place], [place], [place] or [place]; or traveled in the [place] to [place], [place], [place], or [place]; in the last two weeks or you have been in close contact with a person diagnosed with COVID-19 in the last 2 weeks.   . Tell the health care staff about your symptoms: fever, cough and shortness of breath. . After you have been seen by a medical provider, you will be either: o Tested for (COVID-19) and discharged home on quarantine except to seek medical care if symptoms worsen, and asked to  - Stay home and avoid contact with others until you get your results (4-5 days)  - Avoid travel on public transportation if possible (such as bus, train, or airplane) or o Sent to the Emergency Department by EMS for evaluation, COVID-19 testing, and possible admission depending on your condition and test results.  What to do if you are LOW RISK for COVID-19?  Reduce your risk of any infection by using the same precautions used for avoiding the common cold or flu:  Marland Kitchen Wash your hands often with soap and warm water for at least 20 seconds.  If soap and water are not readily available, use an alcohol-based hand sanitizer with at least 60% alcohol.  Marland Kitchen  If coughing or sneezing, cover your mouth and nose by coughing or sneezing into the elbow areas of your shirt or coat, into a tissue or into your sleeve (not your hands). . Avoid shaking hands with others and consider head nods or verbal greetings only. . Avoid touching your eyes, nose, or mouth with unwashed hands.  . Avoid close contact with people who are sick. . Avoid places or events with large numbers  of people in one location, like concerts or sporting events. . Carefully consider travel plans you have or are making. . If you are planning any travel outside or inside the [name], visit the CDC's Travelers' Health webpage for the latest health notices. . If you have some symptoms but not all symptoms, continue to monitor at home and seek medical attention if your symptoms worsen. . If you are having a medical emergency, call 911.   [place] / e-Visit: [email or web address]         [facility]: [facility]: [phone]                   [facility]: [phone]

## 2018-12-24 ENCOUNTER — Telehealth: Payer: Self-pay | Admitting: *Deleted

## 2018-12-24 ENCOUNTER — Telehealth: Payer: Self-pay | Admitting: Hematology and Oncology

## 2018-12-24 NOTE — Telephone Encounter (Signed)
I talk with patient. Waiting for her to cancel pcp scheduled according to staff message from amy

## 2018-12-24 NOTE — Telephone Encounter (Signed)
Received VM from pt, attempt x1 to return call, no answer, lvm to return call to the office.

## 2018-12-25 ENCOUNTER — Inpatient Hospital Stay: Payer: 59

## 2018-12-25 ENCOUNTER — Telehealth: Payer: Self-pay | Admitting: Hematology and Oncology

## 2018-12-25 ENCOUNTER — Other Ambulatory Visit: Payer: Self-pay

## 2018-12-25 VITALS — BP 128/69 | HR 75 | Temp 98.1°F | Resp 18

## 2018-12-25 DIAGNOSIS — C50212 Malignant neoplasm of upper-inner quadrant of left female breast: Secondary | ICD-10-CM

## 2018-12-25 DIAGNOSIS — Z171 Estrogen receptor negative status [ER-]: Secondary | ICD-10-CM

## 2018-12-25 DIAGNOSIS — Z5112 Encounter for antineoplastic immunotherapy: Secondary | ICD-10-CM | POA: Diagnosis not present

## 2018-12-25 MED ORDER — PEGFILGRASTIM INJECTION 6 MG/0.6ML ~~LOC~~
6.0000 mg | PREFILLED_SYRINGE | Freq: Once | SUBCUTANEOUS | Status: AC
  Administered 2018-12-25: 10:00:00 6 mg via SUBCUTANEOUS

## 2018-12-25 MED ORDER — PEGFILGRASTIM INJECTION 6 MG/0.6ML ~~LOC~~
PREFILLED_SYRINGE | SUBCUTANEOUS | Status: AC
  Filled 2018-12-25: qty 0.6

## 2018-12-25 NOTE — Patient Instructions (Signed)

## 2018-12-25 NOTE — Telephone Encounter (Signed)
I talk with patient regarding schedule change °

## 2019-01-02 ENCOUNTER — Other Ambulatory Visit: Payer: Self-pay | Admitting: Hematology and Oncology

## 2019-01-07 ENCOUNTER — Other Ambulatory Visit: Payer: 59

## 2019-01-07 ENCOUNTER — Ambulatory Visit: Payer: 59 | Admitting: Hematology and Oncology

## 2019-01-11 ENCOUNTER — Other Ambulatory Visit: Payer: Self-pay

## 2019-01-11 ENCOUNTER — Encounter: Payer: Self-pay | Admitting: Family Medicine

## 2019-01-11 ENCOUNTER — Ambulatory Visit: Payer: 59 | Admitting: Family Medicine

## 2019-01-11 VITALS — BP 147/79 | HR 67 | Temp 98.3°F | Resp 16 | Ht 61.0 in | Wt 135.8 lb

## 2019-01-11 DIAGNOSIS — R110 Nausea: Secondary | ICD-10-CM | POA: Diagnosis not present

## 2019-01-11 DIAGNOSIS — E1165 Type 2 diabetes mellitus with hyperglycemia: Secondary | ICD-10-CM

## 2019-01-11 DIAGNOSIS — C50912 Malignant neoplasm of unspecified site of left female breast: Secondary | ICD-10-CM

## 2019-01-11 DIAGNOSIS — T451X5A Adverse effect of antineoplastic and immunosuppressive drugs, initial encounter: Secondary | ICD-10-CM

## 2019-01-11 DIAGNOSIS — R530 Neoplastic (malignant) related fatigue: Secondary | ICD-10-CM | POA: Diagnosis not present

## 2019-01-11 NOTE — Progress Notes (Signed)
 Established Patient Office Visit  Subjective:  Patient ID: Erin Lawson, female    DOB: 09/14/1960  Age: 58 y.o. MRN: [id number]  CC:  Chief Complaint  Patient presents with  . Diabetes    3 month f/u. Declines pneumovax    HPI Erin Lawson presents for   Diabetes Mellitus She is eating iron rich foods to increase her iron  She is seeing Dr. Gudena for her Breast Cancer and Chemo. She states that she has sugars in the 110s She denies hypoglycemia She gets nausea from Chemo She gets dry mouth as well.  Fatigue and nausea She takes naps on her days off. She states that she rests on her days off. She reports that she has poor appetite Wt Readings from Last 3 Encounters:  01/11/19 135 lb 12.8 oz (61.6 kg)  12/23/18 138 lb 12.8 oz (63 kg)  12/03/18 141 lb 8 oz (64.2 kg)    Breast Cancer- Left She reports that she has a good relationship with her oncologist  Who has prayed for her and she trusts him. She reports that she is working with Dr. Gudena on keeping up her iron because she does not want any blood products from anyone else in her body.  Hypertension: Patient here for follow-up of elevated blood pressure. She is not exercising and is adherent to low salt diet.  Blood pressure is well controlled at home. Cardiac symptoms none. Patient denies chest pain, claudication, exertional chest pressure/discomfort, irregular heart beat, near-syncope and palpitations.  Cardiovascular risk factors: hypertension. Use of agents associated with hypertension: none. History of target organ damage: none. BP Readings from Last 3 Encounters:  01/11/19 (!) 147/79  12/25/18 128/69  12/23/18 113/63     Past Medical History:  Diagnosis Date  . Anemia   . Anxiety   . Cancer (HCC) 10/2018   left breast IDC  . DM type 2 (diabetes mellitus, type 2) (HCC) 05/18/2012  . GERD (gastroesophageal reflux disease)    OTC prn  . Hypertension     Past Surgical History:  Procedure  Laterality Date  . CESAREAN SECTION    . PORTACATH PLACEMENT Right 11/11/2018   Procedure: INSERTION PORT-A-CATH WITH ULTRASOUND;  Surgeon: Cornett, Thomas, MD;  Location: [facility];  Service: General;  Laterality: Right;  . utreine polyp     resected    Family History  Problem Relation Age of Onset  . Hypertension Sister   . Heart attack Neg Hx     Social History   Socioeconomic History  . Marital status: Single    Spouse name: Not on file  . Number of children: Not on file  . Years of education: Not on file  . Highest education level: Not on file  Occupational History  . Occupation: cna    Comment: [employer]  Tobacco Use  . Smoking status: Never Smoker  . Smokeless tobacco: Never Used  Substance and Sexual Activity  . Alcohol use: No  . Drug use: No  . Sexual activity: Not on file  Other Topics Concern  . 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 Difficulty of Paying Living Expenses: Not on file  Food Insecurity:   . Worried About Charity fundraiser in the Last Year: Not on file  . Ran Out of Food in the Last Year: Not on file  Transportation Needs:   . Lack of Transportation (Medical): Not on file  . Lack of Transportation (Non-Medical): Not on file  Physical Activity:   . Days of Exercise per Week: Not on file  . Minutes of Exercise per Session: Not on file  Stress:   . Feeling of Stress : Not on file  Social Connections:   . Frequency of Communication with Friends and Family: Not on file  . Frequency of Social Gatherings with Friends and Family: Not on file  . Attends  Religious Services: Not on file  . Active Member of Clubs or Organizations: Not on file  . Attends Archivist Meetings: Not on file  . Marital Status: Not on file  Intimate Partner Violence:   . Fear of Current or Ex-Partner: Not on file  . Emotionally Abused: Not on file  . Physically Abused: Not on file  . Sexually Abused: Not on file    Outpatient Medications Prior to Visit  Medication Sig Dispense Refill  . amLODipine (NORVASC) 5 MG tablet Take 1 tablet (5 mg total) by mouth daily. 90 tablet 1  . blood glucose meter kit and supplies KIT Dispense based on patient and insurance preference. Use up to four times daily as directed. (FOR ICD-9 250.00, 250.01). 1 each 0  . blood glucose meter kit and supplies Dispense based on patient and insurance preference. Use up to four times daily as directed. (FOR ICD-10 E10.9, E11.9). 1 each 0  . Ferrous Sulfate (IRON PO) Take by mouth. 1 teaspoon daily    . glucose blood (CONTOUR NEXT TEST) test strip Check sugar once daily  Dx:  DMII controlled non-insulin dependent. 100 each 3  . KLOR-CON M20 20 MEQ tablet TAKE 1 TABLET BY MOUTH TWICE A DAY 30 tablet 3  . LANCETS ULTRA FINE MISC Check sugar up one time daily dx: new onset DMII uncontrolled non-insulin 100 each 3  . lidocaine-prilocaine (EMLA) cream Apply to affected area once 30 g 3  . LORazepam (ATIVAN) 0.5 MG tablet Take 1 tablet (0.5 mg total) by mouth at bedtime as needed for sleep. 30 tablet 0  . metFORMIN (GLUCOPHAGE) 500 MG tablet Take 1 and half tablet by  Mouth daily for diabetes (Patient taking differently: 750 mg. Take 1 and half tablet by  Mouth daily for diabetes) 90 tablet 1  . ondansetron (ZOFRAN) 8 MG tablet Take 1 tablet (8 mg total) by mouth 2 (two) times daily as needed (Nausea or vomiting). Begin 4 days after chemotherapy. 30 tablet 1  . prochlorperazine (COMPAZINE) 10 MG tablet Take 1 tablet (10 mg total) by mouth every 6 (six) hours as needed (Nausea or vomiting). 30  tablet 1   No facility-administered medications prior to visit.    Allergies  Allergen Reactions  . Atenolol Other (See Comments)    REACTION: made me feel bad REACTION: made me feel bad  . Poractant Alfa Other (See Comments)    Migraine   . Pork-Derived Products Other (See Comments)    Migraine   . Meloxicam Rash    ROS Review of  Systems Review of Systems  Constitutional: Negative for activity change, appetite change, chills and fever.  HENT: Negative for congestion, nosebleeds, trouble swallowing and voice change.   Respiratory: Negative for cough, shortness of breath and wheezing.   Gastrointestinal: Negative for diarrhea, nausea and vomiting.  Genitourinary: Negative for difficulty urinating, dysuria, flank pain and hematuria.  Musculoskeletal: Negative for back pain, joint swelling and neck pain.  Neurological: Negative for dizziness, speech difficulty, light-headedness and numbness.  See HPI. All other review of systems negative.     Objective:    Physical Exam  BP (!) 147/79 (BP Location: Right Arm, Patient Position: Sitting, Cuff Size: Normal)   Pulse 67   Temp 98.3 F (36.8 C) (Oral)   Resp 16   Ht 5' 1" (1.549 m)   Wt 135 lb 12.8 oz (61.6 kg)   SpO2 100%   BMI 25.66 kg/m  Wt Readings from Last 3 Encounters:  01/11/19 135 lb 12.8 oz (61.6 kg)  12/23/18 138 lb 12.8 oz (63 kg)  12/03/18 141 lb 8 oz (64.2 kg)   Physical Exam  Constitutional: Oriented to person, place, and time. Appears well-developed and well-nourished.  HENT:  Head: Normocephalic and atraumatic.  Eyes: Conjunctivae and EOM are normal.  Cardiovascular: Normal rate, regular rhythm, normal heart sounds and intact distal pulses.  No murmur heard. Pulmonary/Chest: Effort normal and breath sounds normal. No stridor. No respiratory distress. Has no wheezes.  Neurological: Is alert and oriented to person, place, and time.  Skin: Skin is warm. Capillary refill takes less than 2 seconds.    Psychiatric: Has a normal mood and affect. Behavior is normal. Judgment and thought content normal.     Health Maintenance Due  Topic Date Due  . PNEUMOCOCCAL POLYSACCHARIDE VACCINE AGE 2-64 HIGH RISK  06/13/1961  . PAP SMEAR-Modifier  01/15/2015  . OPHTHALMOLOGY EXAM  09/24/2017  . URINE MICROALBUMIN  12/09/2018    There are no preventive care reminders to display for this patient.  Lab Results  Component Value Date   TSH 2.580 08/04/2017   Lab Results  Component Value Date   WBC 11.3 (H) 12/23/2018   HGB 8.4 (L) 12/23/2018   HCT 27.1 (L) 12/23/2018   MCV 75.9 (L) 12/23/2018   PLT 360 12/23/2018   Lab Results  Component Value Date   NA 140 12/23/2018   K 3.9 12/23/2018   CO2 25 12/23/2018   GLUCOSE 121 (H) 12/23/2018   BUN 11 12/23/2018   CREATININE 0.74 12/23/2018   BILITOT 0.3 12/23/2018   ALKPHOS 139 (H) 12/23/2018   AST 15 12/23/2018   ALT 12 12/23/2018   PROT 6.4 (L) 12/23/2018   ALBUMIN 3.8 12/23/2018   CALCIUM 8.9 12/23/2018   ANIONGAP 8 12/23/2018   Lab Results  Component Value Date   CHOL 182 10/12/2018   Lab Results  Component Value Date   HDL 34 (L) 10/12/2018   Lab Results  Component Value Date   LDLCALC 101 (H) 10/12/2018   Lab Results  Component Value Date   TRIG 275 (H) 10/12/2018   Lab Results  Component Value Date   CHOLHDL 5.4 (H) 10/12/2018   Lab Results  Component Value Date   HGBA1C 8.9 (A) 10/12/2018      Assessment & Plan:   Problem List Items Addressed This Visit      Endocrine   DM type 2 (diabetes mellitus, type 2) (HCC) - Primary Will assess diabetic control    Relevant Orders   HM Diabetes   Foot Exam (Completed)   Microalbumin, urine   Hemoglobin A1c    Other Visit Diagnoses    Chemotherapy-induced nausea    - improved with dietary changes   Neoplastic malignant related fatigue    - discussed her fatigue   Invasive ductal carcinoma of breast, stage 2, left (HCC)    - breast cancer treatment reviewed.   Appreciate the management by Dr. Lindi Adie.      No orders of the defined types were placed in this encounter.   Follow-up: No follow-ups on file.   A total of 40 minutes were spent face-to-face with the patient during this encounter and over half of that time was spent on counseling and coordination of care.  Forrest Moron, MD

## 2019-01-11 NOTE — Patient Instructions (Addendum)
  Please take the metformin as needed for blood glucose greater than 160s.  Take the amlodipine every other day for hypertension.   If you have lab work done today you will be contacted with your lab results within the next 2 weeks.  If you have not heard from [name] then please contact us. The fastest way to get your results is to register for My Chart.   IF you received an x-ray today, you will receive an invoice from [facility]. Please contact [facility] at [phone] with questions or concerns regarding your invoice.   IF you received labwork today, you will receive an invoice from [facility]. Please contact [facility] at [phone] with questions or concerns regarding your invoice.   Our billing staff will not be able to assist you with questions regarding bills from these companies.  You will be contacted with the lab results as soon as they are available. The fastest way to get your results is to activate your My Chart account. Instructions are located on the last page of this paperwork. If you have not heard from [name] regarding the results in 2 weeks, please contact this office.

## 2019-01-12 ENCOUNTER — Other Ambulatory Visit: Payer: Self-pay | Admitting: Family Medicine

## 2019-01-12 DIAGNOSIS — E1165 Type 2 diabetes mellitus with hyperglycemia: Secondary | ICD-10-CM

## 2019-01-12 LAB — HEMOGLOBIN A1C
Est. average glucose Bld gHb Est-mCnc: 120 mg/dL
Hgb A1c MFr Bld: 5.8 % — ABNORMAL HIGH (ref 4.8–5.6)

## 2019-01-12 LAB — MICROALBUMIN, URINE: Microalbumin, Urine: 4.9 ug/mL

## 2019-01-12 MED ORDER — METFORMIN HCL 500 MG PO TABS: 500.0000 mg | ORAL_TABLET | Freq: Every day | ORAL | 1 refills | Status: DC | PRN

## 2019-01-13 ENCOUNTER — Ambulatory Visit: Payer: 59

## 2019-01-17 NOTE — Progress Notes (Signed)
Patient Care Team: Forrest Moron, MD as PCP - General (Internal Medicine) Mauro Kaufmann, RN as Oncology Nurse Navigator Rockwell Germany, RN as Oncology Nurse Navigator Erroll Luna, MD as Consulting Physician (General Surgery) Nicholas Lose, MD as Consulting Physician (Hematology and Oncology) Gery Pray, MD as Consulting Physician (Radiation Oncology)  DIAGNOSIS:    ICD-10-CM   1. Malignant neoplasm of upper-inner quadrant of left breast in female, estrogen receptor negative ([place])  C50.212    Z17.1     SUMMARY OF ONCOLOGIC HISTORY: Oncology History  Malignant neoplasm of upper-inner quadrant of left breast in female, estrogen receptor negative ([place])  10/26/2018 Initial Diagnosis   Patient palpated a left breast mass x 5 weeks. Mammogram showed a 2.8cm mass with calcifications spanning 3.2cm in the left breast and two mildly thickened axillary lymph nodes up to 0.4cm. Biopsy showed IDC, grade 3, HER-2 + (3+), ER/PR -, Ki67 50%, and no evidence of carcinoma in the left axilla.    10/28/2018 Cancer Staging   Staging form: Breast, AJCC 8th Edition - Clinical stage from 10/28/2018: Stage IIA (cT2, cN0, cM0, G2, ER-, PR-, HER2+) - Signed by Nicholas Lose, MD on 10/28/2018   11/12/2018 -  Chemotherapy   The patient had palonosetron (ALOXI) injection 0.25 mg, 0.25 mg, Intravenous,  Once, 3 of 6 cycles Administration: 0.25 mg (11/12/2018), 0.25 mg (12/03/2018), 0.25 mg (12/23/2018) pegfilgrastim (NEULASTA) injection 6 mg, 6 mg, Subcutaneous, Once, 2 of 5 cycles Administration: 6 mg (12/05/2018), 6 mg (12/25/2018) pegfilgrastim-jmdb (FULPHILA) injection 6 mg, 6 mg, Subcutaneous,  Once, 1 of 1 cycle Administration: 6 mg (11/14/2018) CARBOplatin (PARAPLATIN) 500 mg in sodium chloride 0.9 % 250 mL chemo infusion, 500 mg (101.8 % of original dose 493 mg), Intravenous,  Once, 3 of 6 cycles Dose modification:   (original dose 493 mg, Cycle 1, Reason: Provider  Judgment) Administration: 500 mg (11/12/2018), 420 mg (12/03/2018), 420 mg (12/23/2018) DOCEtaxel (TAXOTERE) 110 mg in sodium chloride 0.9 % 250 mL chemo infusion, 65 mg/m2 = 110 mg (100 % of original dose 65 mg/m2), Intravenous,  Once, 3 of 6 cycles Dose modification: 65 mg/m2 (original dose 65 mg/m2, Cycle 1, Reason: Provider Judgment), 50 mg/m2 (original dose 65 mg/m2, Cycle 2, Reason: Dose not tolerated), 40 mg/m2 (original dose 65 mg/m2, Cycle 5, Reason: Dose not tolerated) Administration: 110 mg (11/12/2018), 80 mg (12/03/2018), 70 mg (12/23/2018) pertuzumab (PERJETA) 420 mg in sodium chloride 0.9 % 250 mL chemo infusion, 420 mg (100 % of original dose 420 mg), Intravenous, Once, 3 of 6 cycles Dose modification: 420 mg (original dose 420 mg, Cycle 1, Reason: Provider Judgment) Administration: 420 mg (11/12/2018), 420 mg (12/03/2018), 420 mg (12/23/2018) fosaprepitant (EMEND) 150 mg, dexamethasone (DECADRON) 12 mg in sodium chloride 0.9 % 145 mL IVPB, , Intravenous,  Once, 3 of 6 cycles Administration:  (11/12/2018),  (12/03/2018),  (12/23/2018) trastuzumab-anns (KANJINTI) 399 mg in sodium chloride 0.9 % 250 mL chemo infusion, 6 mg/kg = 399 mg (100 % of original dose 6 mg/kg), Intravenous,  Once, 2 of 5 cycles Dose modification: 6 mg/kg (original dose 6 mg/kg, Cycle 2, Reason: Other (see comments), Comment: J-code in auth referral is for Kanjinti) Administration: 399 mg (12/03/2018), 399 mg (12/23/2018) trastuzumab-dkst (OGIVRI) 525 mg in sodium chloride 0.9 % 250 mL chemo infusion, 8 mg/kg = 525 mg, Intravenous,  Once, 1 of 1 cycle Administration: 525 mg (11/12/2018)  for chemotherapy treatment.      CHIEF COMPLIANT:  Cycle4TCH Perjeta  INTERVAL HISTORY: Erin Shisler  Lawson is a 59 y.o. with above-mentioned history of left breast cancer currently on neoadjuvant chemotherapy with [place].She presents to the clinic todayforcycle4.   She tells me that she has had transient improvement  in her appetite and she is craving foods and is eating better.  She even feels like cooking.  She denies any significant diarrhea or nausea or vomiting with this treatment.  Denies neuropathy.   ALLERGIES:  is allergic to atenolol; poractant alfa; pork-derived products; and meloxicam.  MEDICATIONS:  Current Outpatient Medications  Medication Sig Dispense Refill  . amLODipine (NORVASC) 5 MG tablet Take 1 tablet (5 mg total) by mouth every other day. 90 tablet 1  . blood glucose meter kit and supplies KIT Dispense based on patient and insurance preference. Use up to four times daily as directed. (FOR ICD-9 250.00, 250.01). 1 each 0  . blood glucose meter kit and supplies Dispense based on patient and insurance preference. Use up to four times daily as directed. (FOR ICD-10 E10.9, E11.9). 1 each 0  . Ferrous Sulfate (IRON PO) Take by mouth. 1 teaspoon daily    . glucose blood (CONTOUR NEXT TEST) test strip Check sugar once daily  Dx:  DMII controlled non-insulin dependent. 100 each 3  . KLOR-CON M20 20 MEQ tablet TAKE 1 TABLET BY MOUTH TWICE A DAY 30 tablet 3  . LANCETS ULTRA FINE MISC Check sugar up one time daily dx: new onset DMII uncontrolled non-insulin 100 each 3  . lidocaine-prilocaine (EMLA) cream Apply to affected area once 30 g 3  . LORazepam (ATIVAN) 0.5 MG tablet Take 1 tablet (0.5 mg total) by mouth at bedtime as needed for sleep. 30 tablet 0  . ondansetron (ZOFRAN) 8 MG tablet Take 1 tablet (8 mg total) by mouth 2 (two) times daily as needed (Nausea or vomiting). Begin 4 days after chemotherapy. 30 tablet 1  . prochlorperazine (COMPAZINE) 10 MG tablet Take 1 tablet (10 mg total) by mouth every 6 (six) hours as needed (Nausea or vomiting). 30 tablet 1   No current facility-administered medications for this visit.    PHYSICAL EXAMINATION: ECOG PERFORMANCE STATUS: 1 - Symptomatic but completely ambulatory  Vitals:   01/18/19 1101  BP: (!) 147/65  Pulse: 75  Resp: 18  Temp: 97.8  F (36.6 C)  SpO2: 100%   Filed Weights   01/18/19 1101  Weight: 142 lb 14.4 oz (64.8 kg)      LABORATORY DATA:  I have reviewed the data as listed CMP Latest Ref Rng & Units 12/23/2018 12/03/2018 11/20/2018  Glucose 70 - 99 mg/dL 121(H) 136(H) 118(H)  BUN 6 - 20 mg/dL '11 8 11  ' Creatinine 0.44 - 1.00 mg/dL 0.74 0.74 0.82  Sodium 135 - 145 mmol/L 140 141 139  Potassium 3.5 - 5.1 mmol/L 3.9 3.0(LL) 3.7  Chloride 98 - 111 mmol/L 107 105 100  CO2 22 - 32 mmol/L '25 26 26  ' Calcium 8.9 - 10.3 mg/dL 8.9 8.8(L) 9.3  Total Protein 6.5 - 8.1 g/dL 6.4(L) 6.2(L) 7.0  Total Bilirubin 0.3 - 1.2 mg/dL 0.3 0.3 0.3  Alkaline Phos 38 - 126 U/L 139(H) 115 193(H)  AST 15 - 41 U/L '15 21 29  ' ALT 0 - 44 U/L '12 23 17    ' Lab Results  Component Value Date   WBC 6.9 01/18/2019   HGB 8.2 (L) 01/18/2019   HCT 26.6 (L) 01/18/2019   MCV 80.6 01/18/2019   PLT 257 01/18/2019   NEUTROABS 3.6 01/18/2019  ASSESSMENT & PLAN:  Malignant neoplasm of upper-inner quadrant of left breast in female, estrogen receptor negative ([place]) 10/26/2018:Patient palpated a left breast mass x 5 weeks. Mammogram showed a 2.8cm mass with calcifications spanning 3.2cm in the left breast and two mildly thickened axillary lymph nodes up to 0.4cm. Biopsy showed IDC, grade 3, HER-2 + (3+), ER/PR -, Ki67 50%, and no evidence of carcinoma in the left axilla.  Treatment plan: 1. Neoadjuvant chemotherapy with TCH Perjeta 6 cycles followed by HerceptinPerjetamaintenance for 1 year 2. Followed by breast conserving surgerywith sentinel lymph node study 3. Followed by adjuvant radiation therapy ---------------------------------------------------------------------------------------------------------------------------------------------- Current treatment: Cycle 4 [place] reviewed Echocardiogram: 11/05/2018: EF 65 to 70% Patient is diabetic and we are anxious about her blood sugars.  Chemo toxicities: 1.Nausea and  vomiting:Resolved 2.Diarrhea:Improved after taking Imodium as prescribed. 3.Chemotherapy-induced anemia: Today's hemoglobin is 8.2.  Her appetite has improved and she is eating better. 4.Severe anxiety issues:She appears to be doing much better today. 5.Hypokalemia due to diarrhea.  on 20 mg orally daily    Return to clinic in 3 weeks for cycle 5  Total time spent: 30 mins including face to face time and time spent for planning, charting and co-ordination of care  No orders of the defined types were placed in this encounter.  The patient has a good understanding of the overall plan. she agrees with it. she will call with any problems that may develop before the next visit here.  Nicholas Lose, MD 01/18/2019  Julious Oka Dorshimer, am acting as scribe for Dr. Nicholas Lose.  I have reviewed the above document for accuracy and completeness, and I agree with the above.

## 2019-01-18 ENCOUNTER — Inpatient Hospital Stay: Payer: 59 | Attending: Hematology and Oncology

## 2019-01-18 ENCOUNTER — Other Ambulatory Visit: Payer: Self-pay | Admitting: Hematology and Oncology

## 2019-01-18 ENCOUNTER — Inpatient Hospital Stay: Payer: 59

## 2019-01-18 ENCOUNTER — Other Ambulatory Visit: Payer: Self-pay

## 2019-01-18 ENCOUNTER — Encounter: Payer: Self-pay | Admitting: *Deleted

## 2019-01-18 ENCOUNTER — Inpatient Hospital Stay (HOSPITAL_BASED_OUTPATIENT_CLINIC_OR_DEPARTMENT_OTHER): Payer: 59 | Admitting: Hematology and Oncology

## 2019-01-18 DIAGNOSIS — Z95828 Presence of other vascular implants and grafts: Secondary | ICD-10-CM

## 2019-01-18 DIAGNOSIS — E876 Hypokalemia: Secondary | ICD-10-CM | POA: Insufficient documentation

## 2019-01-18 DIAGNOSIS — D6481 Anemia due to antineoplastic chemotherapy: Secondary | ICD-10-CM | POA: Insufficient documentation

## 2019-01-18 DIAGNOSIS — E119 Type 2 diabetes mellitus without complications: Secondary | ICD-10-CM | POA: Insufficient documentation

## 2019-01-18 DIAGNOSIS — Z171 Estrogen receptor negative status [ER-]: Secondary | ICD-10-CM | POA: Insufficient documentation

## 2019-01-18 DIAGNOSIS — C50212 Malignant neoplasm of upper-inner quadrant of left female breast: Secondary | ICD-10-CM | POA: Diagnosis present

## 2019-01-18 DIAGNOSIS — Z5189 Encounter for other specified aftercare: Secondary | ICD-10-CM | POA: Insufficient documentation

## 2019-01-18 DIAGNOSIS — Z5112 Encounter for antineoplastic immunotherapy: Secondary | ICD-10-CM | POA: Diagnosis not present

## 2019-01-18 DIAGNOSIS — Z5111 Encounter for antineoplastic chemotherapy: Secondary | ICD-10-CM | POA: Diagnosis present

## 2019-01-18 LAB — CBC WITH DIFFERENTIAL (CANCER CENTER ONLY)
Abs Immature Granulocytes: 0.03 10*3/uL (ref 0.00–0.07)
Basophils Absolute: 0 10*3/uL (ref 0.0–0.1)
Basophils Relative: 1 %
Eosinophils Absolute: 0.2 10*3/uL (ref 0.0–0.5)
Eosinophils Relative: 3 %
HCT: 26.6 % — ABNORMAL LOW (ref 36.0–46.0)
Hemoglobin: 8.2 g/dL — ABNORMAL LOW (ref 12.0–15.0)
Immature Granulocytes: 0 %
Lymphocytes Relative: 37 %
Lymphs Abs: 2.5 10*3/uL (ref 0.7–4.0)
MCH: 24.8 pg — ABNORMAL LOW (ref 26.0–34.0)
MCHC: 30.8 g/dL (ref 30.0–36.0)
MCV: 80.6 fL (ref 80.0–100.0)
Monocytes Absolute: 0.6 10*3/uL (ref 0.1–1.0)
Monocytes Relative: 8 %
Neutro Abs: 3.6 10*3/uL (ref 1.7–7.7)
Neutrophils Relative %: 51 %
Platelet Count: 257 10*3/uL (ref 150–400)
RBC: 3.3 MIL/uL — ABNORMAL LOW (ref 3.87–5.11)
RDW: 18.6 % — ABNORMAL HIGH (ref 11.5–15.5)
WBC Count: 6.9 10*3/uL (ref 4.0–10.5)
nRBC: 0 % (ref 0.0–0.2)

## 2019-01-18 LAB — CMP (CANCER CENTER ONLY)
ALT: 27 U/L (ref 0–44)
AST: 27 U/L (ref 15–41)
Albumin: 3.7 g/dL (ref 3.5–5.0)
Alkaline Phosphatase: 124 U/L (ref 38–126)
Anion gap: 10 (ref 5–15)
BUN: 17 mg/dL (ref 6–20)
CO2: 24 mmol/L (ref 22–32)
Calcium: 9 mg/dL (ref 8.9–10.3)
Chloride: 106 mmol/L (ref 98–111)
Creatinine: 0.64 mg/dL (ref 0.44–1.00)
GFR, Est AFR Am: >60 mL/min (ref >60)
GFR, Estimated: >60 mL/min (ref >60)
Glucose, Bld: 180 mg/dL — ABNORMAL HIGH (ref 70–99)
Potassium: 3.7 mmol/L (ref 3.5–5.1)
Sodium: 140 mmol/L (ref 135–145)
Total Bilirubin: 0.4 mg/dL (ref 0.3–1.2)
Total Protein: 6 g/dL — ABNORMAL LOW (ref 6.5–8.1)

## 2019-01-18 MED ORDER — AMLODIPINE BESYLATE 5 MG PO TABS: 5.0000 mg | ORAL_TABLET | ORAL | 1 refills | Status: DC

## 2019-01-18 MED ORDER — PALONOSETRON HCL INJECTION 0.25 MG/5ML
0.2500 mg | Freq: Once | INTRAVENOUS | Status: AC
  Administered 2019-01-18: 0.25 mg via INTRAVENOUS

## 2019-01-18 MED ORDER — SODIUM CHLORIDE 0.9% FLUSH
10.0000 mL | INTRAVENOUS | Status: DC | PRN
  Administered 2019-01-18: 10 mL
  Filled 2019-01-18: qty 10

## 2019-01-18 MED ORDER — SODIUM CHLORIDE 0.9 % IV SOLN
Freq: Once | INTRAVENOUS | Status: AC
  Filled 2019-01-18: qty 250

## 2019-01-18 MED ORDER — SODIUM CHLORIDE 0.9 % IV SOLN
416.4000 mg | Freq: Once | INTRAVENOUS | Status: AC
  Administered 2019-01-18: 420 mg via INTRAVENOUS
  Filled 2019-01-18: qty 42

## 2019-01-18 MED ORDER — ACETAMINOPHEN 325 MG PO TABS
ORAL_TABLET | ORAL | Status: AC
  Filled 2019-01-18: qty 2

## 2019-01-18 MED ORDER — PALONOSETRON HCL INJECTION 0.25 MG/5ML
INTRAVENOUS | Status: AC
  Filled 2019-01-18: qty 5

## 2019-01-18 MED ORDER — ANTICOAGULANT SODIUM CITRATE 4% (200MG/5ML) IV SOLN
5.0000 mL | Status: DC | PRN
  Administered 2019-01-18: 5 mL via INTRAVENOUS
  Filled 2019-01-18: qty 5

## 2019-01-18 MED ORDER — SODIUM CHLORIDE 0.9 % IV SOLN
420.0000 mg | Freq: Once | INTRAVENOUS | Status: AC
  Administered 2019-01-18: 420 mg via INTRAVENOUS
  Filled 2019-01-18: qty 14

## 2019-01-18 MED ORDER — DIPHENHYDRAMINE HCL 25 MG PO CAPS
50.0000 mg | ORAL_CAPSULE | Freq: Once | ORAL | Status: AC
  Administered 2019-01-18: 50 mg via ORAL

## 2019-01-18 MED ORDER — ACETAMINOPHEN 325 MG PO TABS
650.0000 mg | ORAL_TABLET | Freq: Once | ORAL | Status: AC
  Administered 2019-01-18: 650 mg via ORAL

## 2019-01-18 MED ORDER — TRASTUZUMAB-ANNS CHEMO 150 MG IV SOLR
6.0000 mg/kg | Freq: Once | INTRAVENOUS | Status: AC
  Administered 2019-01-18: 399 mg via INTRAVENOUS
  Filled 2019-01-18: qty 19

## 2019-01-18 MED ORDER — DIPHENHYDRAMINE HCL 25 MG PO CAPS
ORAL_CAPSULE | ORAL | Status: AC
  Filled 2019-01-18: qty 1

## 2019-01-18 MED ORDER — SODIUM CHLORIDE 0.9 % IV SOLN
Freq: Once | INTRAVENOUS | Status: AC
  Filled 2019-01-18: qty 5

## 2019-01-18 MED ORDER — DIPHENHYDRAMINE HCL 25 MG PO CAPS
ORAL_CAPSULE | ORAL | Status: AC
  Filled 2019-01-18: qty 2

## 2019-01-18 MED ORDER — SODIUM CHLORIDE 0.9 % IV SOLN
40.0000 mg/m2 | Freq: Once | INTRAVENOUS | Status: AC
  Administered 2019-01-18: 70 mg via INTRAVENOUS
  Filled 2019-01-18: qty 7

## 2019-01-18 NOTE — Patient Instructions (Signed)
[facility] Discharge Instructions for Patients Receiving Chemotherapy  Today you received the following chemotherapy agents:  Trastuzumab, Pertuzumab, Carboplatin, Taxotere  To help prevent nausea and vomiting after your treatment, we encourage you to take your nausea medication as prescribed.   If you develop nausea and vomiting that is not controlled by your nausea medication, call the clinic.   BELOW ARE SYMPTOMS THAT SHOULD BE REPORTED IMMEDIATELY:  *FEVER GREATER THAN 100.5 F  *CHILLS WITH OR WITHOUT FEVER  NAUSEA AND VOMITING THAT IS NOT CONTROLLED WITH YOUR NAUSEA MEDICATION  *UNUSUAL SHORTNESS OF BREATH  *UNUSUAL BRUISING OR BLEEDING  TENDERNESS IN MOUTH AND THROAT WITH OR WITHOUT PRESENCE OF ULCERS  *URINARY PROBLEMS  *BOWEL PROBLEMS  UNUSUAL RASH Items with * indicate a potential emergency and should be followed up as soon as possible.  Feel free to call the clinic should you have any questions or concerns. The clinic phone number is [phone].  Please show the [facility] at check-in to the Emergency Department and triage nurse.

## 2019-01-18 NOTE — Assessment & Plan Note (Signed)
10/26/2018:Patient palpated a left breast mass x 5 weeks. Mammogram showed a 2.8cm mass with calcifications spanning 3.2cm in the left breast and two mildly thickened axillary lymph nodes up to 0.4cm. Biopsy showed IDC, grade 3, HER-2 + (3+), ER/PR -, Ki67 50%, and no evidence of carcinoma in the left axilla.  Treatment plan: 1. Neoadjuvant chemotherapy with TCH Perj[place] 6 cycles followed by HerceptinPerjetamaintenance for 1 year 2. Followed by breast conserving surgerywith sentinel lymph node study 3. Followed by adjuvant radiation therapy ---------------------------------------------------------------------------------------------------------------------------------------------- Current treatment: Cycle 4 [place] reviewed Echocardiogram: 11/05/2018: EF 65 to 70% Patient is diabetic and we are anxious about her blood sugars.  Chemo toxicities: 1.Nausea and vomiting:Resolved 2.Diarrhea:Improved after taking Imodium as prescribed. 3.Chemotherapy-induced anemia: I encouraged her to eat iron-containing foods.  She may need blood transfusion if her hemoglobin drops below 8. 4.Leukocytosis secondary to G-CSF: We discussed the pros and cons of stopping the growth factor injection.  We decided to maintain it for the time being. Severe anxiety issues:She appears to be doing much better today. 5.Hypokalemia due to diarrhea. We will start her on 20 mg orally daily    Return to clinic in 3 weeks for cycle 5

## 2019-01-20 ENCOUNTER — Inpatient Hospital Stay: Payer: 59

## 2019-01-20 ENCOUNTER — Other Ambulatory Visit: Payer: Self-pay

## 2019-01-20 VITALS — BP 142/68 | HR 72 | Temp 98.2°F | Resp 18

## 2019-01-20 DIAGNOSIS — Z171 Estrogen receptor negative status [ER-]: Secondary | ICD-10-CM

## 2019-01-20 DIAGNOSIS — C50212 Malignant neoplasm of upper-inner quadrant of left female breast: Secondary | ICD-10-CM

## 2019-01-20 DIAGNOSIS — Z5112 Encounter for antineoplastic immunotherapy: Secondary | ICD-10-CM | POA: Diagnosis not present

## 2019-01-20 MED ORDER — PEGFILGRASTIM INJECTION 6 MG/0.6ML ~~LOC~~
PREFILLED_SYRINGE | SUBCUTANEOUS | Status: AC
  Filled 2019-01-20: qty 0.6

## 2019-01-20 MED ORDER — PEGFILGRASTIM INJECTION 6 MG/0.6ML ~~LOC~~
6.0000 mg | PREFILLED_SYRINGE | Freq: Once | SUBCUTANEOUS | Status: AC
  Administered 2019-01-20: 14:00:00 6 mg via SUBCUTANEOUS

## 2019-01-20 NOTE — Patient Instructions (Signed)

## 2019-02-03 ENCOUNTER — Ambulatory Visit: Payer: 59

## 2019-02-03 ENCOUNTER — Other Ambulatory Visit: Payer: 59

## 2019-02-03 ENCOUNTER — Ambulatory Visit: Payer: 59 | Admitting: Adult Health

## 2019-02-05 ENCOUNTER — Ambulatory Visit: Payer: 59

## 2019-02-08 ENCOUNTER — Other Ambulatory Visit: Payer: 59

## 2019-02-08 ENCOUNTER — Ambulatory Visit: Payer: 59

## 2019-02-08 ENCOUNTER — Encounter: Payer: Self-pay | Admitting: *Deleted

## 2019-02-08 ENCOUNTER — Ambulatory Visit: Payer: 59 | Admitting: Hematology and Oncology

## 2019-02-09 NOTE — Progress Notes (Signed)
Patient Care Team: Forrest Moron, MD as PCP - General (Internal Medicine) Mauro Kaufmann, RN as Oncology Nurse Navigator Rockwell Germany, RN as Oncology Nurse Navigator Erroll Luna, MD as Consulting Physician (General Surgery) Nicholas Lose, MD as Consulting Physician (Hematology and Oncology) Gery Pray, MD as Consulting Physician (Radiation Oncology)  DIAGNOSIS:    ICD-10-CM   1. Malignant neoplasm of upper-inner quadrant of left breast in female, estrogen receptor negative (Erin Lawson)  C50.212    Z17.1     SUMMARY OF ONCOLOGIC HISTORY: Oncology History  Malignant neoplasm of upper-inner quadrant of left breast in female, estrogen receptor negative (Erin Lawson)  10/26/2018 Initial Diagnosis   Patient palpated a left breast mass x 5 weeks. Mammogram showed a 2.8cm mass with calcifications spanning 3.2cm in the left breast and two mildly thickened axillary lymph nodes up to 0.4cm. Biopsy showed IDC, grade 3, HER-2 + (3+), ER/PR -, Ki67 50%, and no evidence of carcinoma in the left axilla.    10/28/2018 Cancer Staging   Staging form: Breast, AJCC 8th Edition - Clinical stage from 10/28/2018: Stage IIA (cT2, cN0, cM0, G2, ER-, PR-, HER2+) - Signed by Nicholas Lose, MD on 10/28/2018   11/12/2018 -  Chemotherapy   The patient had palonosetron (ALOXI) injection 0.25 mg, 0.25 mg, Intravenous,  Once, 4 of 6 cycles Administration: 0.25 mg (11/12/2018), 0.25 mg (12/03/2018), 0.25 mg (12/23/2018), 0.25 mg (01/18/2019) pegfilgrastim (NEULASTA) injection 6 mg, 6 mg, Subcutaneous, Once, 3 of 5 cycles Administration: 6 mg (12/05/2018), 6 mg (12/25/2018), 6 mg (01/20/2019) pegfilgrastim-jmdb (FULPHILA) injection 6 mg, 6 mg, Subcutaneous,  Once, 1 of 1 cycle Administration: 6 mg (11/14/2018) CARBOplatin (PARAPLATIN) 500 mg in sodium chloride 0.9 % 250 mL chemo infusion, 500 mg (101.8 % of original dose 493 mg), Intravenous,  Once, 4 of 6 cycles Dose modification:   (original dose 493 mg, Cycle 1,  Reason: Provider Judgment) Administration: 500 mg (11/12/2018), 420 mg (12/03/2018), 420 mg (12/23/2018), 420 mg (01/18/2019) DOCEtaxel (TAXOTERE) 110 mg in sodium chloride 0.9 % 250 mL chemo infusion, 65 mg/m2 = 110 mg (100 % of original dose 65 mg/m2), Intravenous,  Once, 4 of 6 cycles Dose modification: 65 mg/m2 (original dose 65 mg/m2, Cycle 1, Reason: Provider Judgment), 50 mg/m2 (original dose 65 mg/m2, Cycle 2, Reason: Dose not tolerated), 40 mg/m2 (original dose 65 mg/m2, Cycle 5, Reason: Dose not tolerated) Administration: 110 mg (11/12/2018), 80 mg (12/03/2018), 70 mg (12/23/2018), 70 mg (01/18/2019) pertuzumab (PERJETA) 420 mg in sodium chloride 0.9 % 250 mL chemo infusion, 420 mg (100 % of original dose 420 mg), Intravenous, Once, 4 of 6 cycles Dose modification: 420 mg (original dose 420 mg, Cycle 1, Reason: Provider Judgment) Administration: 420 mg (11/12/2018), 420 mg (12/03/2018), 420 mg (12/23/2018), 420 mg (01/18/2019) fosaprepitant (EMEND) 150 mg, dexamethasone (DECADRON) 12 mg in sodium chloride 0.9 % 145 mL IVPB, , Intravenous,  Once, 4 of 6 cycles Administration:  (11/12/2018),  (12/03/2018),  (12/23/2018),  (01/18/2019) trastuzumab-anns (KANJINTI) 399 mg in sodium chloride 0.9 % 250 mL chemo infusion, 6 mg/kg = 399 mg (100 % of original dose 6 mg/kg), Intravenous,  Once, 3 of 5 cycles Dose modification: 6 mg/kg (original dose 6 mg/kg, Cycle 2, Reason: Other (see comments), Comment: J-code in auth referral is for Kanjinti) Administration: 399 mg (12/03/2018), 399 mg (12/23/2018), 399 mg (01/18/2019) trastuzumab-dkst (OGIVRI) 525 mg in sodium chloride 0.9 % 250 mL chemo infusion, 8 mg/kg = 525 mg, Intravenous,  Once, 1 of 1 cycle Administration: 525 mg (  11/12/2018)  for chemotherapy treatment.      CHIEF COMPLIANT: Cycle5TCH Perjeta  INTERVAL HISTORY: Erin Lawson is a 59 y.o. with above-mentioned history of left breast cancer currently on neoadjuvant chemotherapy with TCH  Perjeta.She presents to the clinic todayforcycle5.  She has tolerated chemotherapy extremely well without any nausea or vomiting.  She is able to work full-time.  She in fact worked last night and is here for her chemotherapy.  Denies any neuropathy.  She is anxious about the next steps in the treatment process which is surgery.  She does have plenty of energy.  Even though she is anemic she does not seem to feel the symptoms of anemia.  She has not been taking her blood pressure medication hence her blood pressure is elevated.  ALLERGIES:  is allergic to atenolol; poractant alfa; pork-derived products; and meloxicam.  MEDICATIONS:  Current Outpatient Medications  Medication Sig Dispense Refill  . amLODipine (NORVASC) 5 MG tablet Take 1 tablet (5 mg total) by mouth every other day. 90 tablet 1  . blood glucose meter kit and supplies KIT Dispense based on patient and insurance preference. Use up to four times daily as directed. (FOR ICD-9 250.00, 250.01). 1 each 0  . blood glucose meter kit and supplies Dispense based on patient and insurance preference. Use up to four times daily as directed. (FOR ICD-10 E10.9, E11.9). 1 each 0  . Ferrous Sulfate (IRON PO) Take by mouth. 1 teaspoon daily    . glucose blood (CONTOUR NEXT TEST) test strip Check sugar once daily  Dx:  DMII controlled non-insulin dependent. 100 each 3  . KLOR-CON M20 20 MEQ tablet TAKE 1 TABLET BY MOUTH TWICE A DAY 180 tablet 1  . LANCETS ULTRA FINE MISC Check sugar up one time daily dx: new onset DMII uncontrolled non-insulin 100 each 3  . lidocaine-prilocaine (EMLA) cream Apply to affected area once 30 g 3  . LORazepam (ATIVAN) 0.5 MG tablet Take 1 tablet (0.5 mg total) by mouth at bedtime as needed for sleep. 30 tablet 0  . ondansetron (ZOFRAN) 8 MG tablet Take 1 tablet (8 mg total) by mouth 2 (two) times daily as needed (Nausea or vomiting). Begin 4 days after chemotherapy. 30 tablet 1  . prochlorperazine (COMPAZINE) 10 MG  tablet Take 1 tablet (10 mg total) by mouth every 6 (six) hours as needed (Nausea or vomiting). 30 tablet 1   No current facility-administered medications for this visit.    PHYSICAL EXAMINATION: ECOG PERFORMANCE STATUS: 1 - Symptomatic but completely ambulatory  Vitals:   02/10/19 0841  BP: (!) 173/77  Pulse: 71  Resp: 18  Temp: (!) 97.5 F (36.4 C)  SpO2: 100%   Filed Weights   02/10/19 0841  Weight: 146 lb 11.2 oz (66.5 kg)    LABORATORY DATA:  I have reviewed the data as listed CMP Latest Ref Rng & Units 01/18/2019 12/23/2018 12/03/2018  Glucose 70 - 99 mg/dL 180(H) 121(H) 136(H)  BUN 6 - 20 mg/dL '17 11 8  ' Creatinine 0.44 - 1.00 mg/dL 0.64 0.74 0.74  Sodium 135 - 145 mmol/L 140 140 141  Potassium 3.5 - 5.1 mmol/L 3.7 3.9 3.0(LL)  Chloride 98 - 111 mmol/L 106 107 105  CO2 22 - 32 mmol/L '24 25 26  ' Calcium 8.9 - 10.3 mg/dL 9.0 8.9 8.8(L)  Total Protein 6.5 - 8.1 g/dL 6.0(L) 6.4(L) 6.2(L)  Total Bilirubin 0.3 - 1.2 mg/dL 0.4 0.3 0.3  Alkaline Phos 38 - 126 U/L 124  139(H) 115  AST 15 - 41 U/L '27 15 21  ' ALT 0 - 44 U/L '27 12 23    ' Lab Results  Component Value Date   WBC 8.3 02/10/2019   HGB 8.6 (L) 02/10/2019   HCT 27.3 (L) 02/10/2019   MCV 78.4 (L) 02/10/2019   PLT 225 02/10/2019   NEUTROABS 4.5 02/10/2019    ASSESSMENT & PLAN:  Malignant neoplasm of upper-inner quadrant of left breast in female, estrogen receptor negative ([place]) 10/26/2018:Patient palpated a left breast mass x 5 weeks. Mammogram showed a 2.8cm mass with calcifications spanning 3.2cm in the left breast and two mildly thickened axillary lymph nodes up to 0.4cm. Biopsy showed IDC, grade 3, HER-2 + (3+), ER/PR -, Ki67 50%, and no evidence of carcinoma in the left axilla.  Treatment plan: 1. Neoadjuvant chemotherapy with TCH Perjeta 6 cycles followed by HerceptinPerjetamaintenance for 1 year 2. Followed by breast conserving surgerywith sentinel lymph node study 3. Followed by adjuvant radiation  therapy ---------------------------------------------------------------------------------------------------------------------------------------------- Current treatment: [place] reviewed Echocardiogram: 11/05/2018: EF 65 to 70% Patient is diabetic and we are anxious about her blood sugars.  Chemo toxicities: 1.Nausea and vomiting:Resolved 2.Diarrhea:On Imodium. 3.Chemotherapy-induced anemia: Today's hemoglobin is   4.Severe anxiety issues:She is having anxiety issues better 5.Hypokalemia due to diarrhea.  On oral potassium daily   Return to clinic in 3 weeks for cycle6     No orders of the defined types were placed in this encounter.  The patient has a good understanding of the overall plan. she agrees with it. she will call with any problems that may develop before the next visit here.  Total time spent: 30 mins including face to face time and time spent for planning, charting and coordination of care  Rulon Eisenmenger, MD, MPH 02/10/2019  I, Molly Dorshimer, am acting as scribe for Dr. Nicholas Lose.  I have reviewed the above documentation for accuracy and completeness, and I agree with the above.

## 2019-02-10 ENCOUNTER — Inpatient Hospital Stay: Payer: 59

## 2019-02-10 ENCOUNTER — Inpatient Hospital Stay (HOSPITAL_BASED_OUTPATIENT_CLINIC_OR_DEPARTMENT_OTHER): Payer: 59 | Admitting: Hematology and Oncology

## 2019-02-10 ENCOUNTER — Encounter: Payer: Self-pay | Admitting: General Practice

## 2019-02-10 ENCOUNTER — Ambulatory Visit: Payer: 59

## 2019-02-10 ENCOUNTER — Other Ambulatory Visit: Payer: Self-pay

## 2019-02-10 ENCOUNTER — Encounter: Payer: Self-pay | Admitting: *Deleted

## 2019-02-10 DIAGNOSIS — C50212 Malignant neoplasm of upper-inner quadrant of left female breast: Secondary | ICD-10-CM

## 2019-02-10 DIAGNOSIS — Z171 Estrogen receptor negative status [ER-]: Secondary | ICD-10-CM | POA: Diagnosis not present

## 2019-02-10 DIAGNOSIS — Z5112 Encounter for antineoplastic immunotherapy: Secondary | ICD-10-CM | POA: Diagnosis not present

## 2019-02-10 LAB — CMP (CANCER CENTER ONLY)
ALT: 16 U/L (ref 0–44)
AST: 16 U/L (ref 15–41)
Albumin: 3.9 g/dL (ref 3.5–5.0)
Alkaline Phosphatase: 144 U/L — ABNORMAL HIGH (ref 38–126)
Anion gap: 10 (ref 5–15)
BUN: 13 mg/dL (ref 6–20)
CO2: 26 mmol/L (ref 22–32)
Calcium: 8.9 mg/dL (ref 8.9–10.3)
Chloride: 106 mmol/L (ref 98–111)
Creatinine: 0.73 mg/dL (ref 0.44–1.00)
GFR, Est AFR Am: >60 mL/min (ref >60)
GFR, Estimated: >60 mL/min (ref >60)
Glucose, Bld: 138 mg/dL — ABNORMAL HIGH (ref 70–99)
Potassium: 3.5 mmol/L (ref 3.5–5.1)
Sodium: 142 mmol/L (ref 135–145)
Total Bilirubin: 0.3 mg/dL (ref 0.3–1.2)
Total Protein: 6.5 g/dL (ref 6.5–8.1)

## 2019-02-10 LAB — CBC WITH DIFFERENTIAL (CANCER CENTER ONLY)
Abs Immature Granulocytes: 0.04 10*3/uL (ref 0.00–0.07)
Basophils Absolute: 0 10*3/uL (ref 0.0–0.1)
Basophils Relative: 1 %
Eosinophils Absolute: 0.1 10*3/uL (ref 0.0–0.5)
Eosinophils Relative: 1 %
HCT: 27.3 % — ABNORMAL LOW (ref 36.0–46.0)
Hemoglobin: 8.6 g/dL — ABNORMAL LOW (ref 12.0–15.0)
Immature Granulocytes: 1 %
Lymphocytes Relative: 35 %
Lymphs Abs: 2.9 10*3/uL (ref 0.7–4.0)
MCH: 24.7 pg — ABNORMAL LOW (ref 26.0–34.0)
MCHC: 31.5 g/dL (ref 30.0–36.0)
MCV: 78.4 fL — ABNORMAL LOW (ref 80.0–100.0)
Monocytes Absolute: 0.7 10*3/uL (ref 0.1–1.0)
Monocytes Relative: 8 %
Neutro Abs: 4.5 10*3/uL (ref 1.7–7.7)
Neutrophils Relative %: 54 %
Platelet Count: 225 10*3/uL (ref 150–400)
RBC: 3.48 MIL/uL — ABNORMAL LOW (ref 3.87–5.11)
RDW: 16.8 % — ABNORMAL HIGH (ref 11.5–15.5)
WBC Count: 8.3 10*3/uL (ref 4.0–10.5)
nRBC: 0 % (ref 0.0–0.2)

## 2019-02-10 MED ORDER — SODIUM CHLORIDE 0.9 % IV SOLN
420.0000 mg | Freq: Once | INTRAVENOUS | Status: AC
  Administered 2019-02-10: 420 mg via INTRAVENOUS
  Filled 2019-02-10: qty 14

## 2019-02-10 MED ORDER — SODIUM CHLORIDE 0.9 % IV SOLN
40.0000 mg/m2 | Freq: Once | INTRAVENOUS | Status: AC
  Administered 2019-02-10: 70 mg via INTRAVENOUS
  Filled 2019-02-10: qty 7

## 2019-02-10 MED ORDER — TRASTUZUMAB-ANNS CHEMO 150 MG IV SOLR
6.0000 mg/kg | Freq: Once | INTRAVENOUS | Status: AC
  Administered 2019-02-10: 399 mg via INTRAVENOUS
  Filled 2019-02-10: qty 19

## 2019-02-10 MED ORDER — PALONOSETRON HCL INJECTION 0.25 MG/5ML
0.2500 mg | Freq: Once | INTRAVENOUS | Status: AC
  Administered 2019-02-10: 0.25 mg via INTRAVENOUS

## 2019-02-10 MED ORDER — SODIUM CHLORIDE 0.9 % IV SOLN
416.4000 mg | Freq: Once | INTRAVENOUS | Status: AC
  Administered 2019-02-10: 420 mg via INTRAVENOUS
  Filled 2019-02-10: qty 42

## 2019-02-10 MED ORDER — ACETAMINOPHEN 325 MG PO TABS
650.0000 mg | ORAL_TABLET | Freq: Once | ORAL | Status: AC
  Administered 2019-02-10: 650 mg via ORAL

## 2019-02-10 MED ORDER — SODIUM CHLORIDE 0.9% FLUSH
10.0000 mL | INTRAVENOUS | Status: DC | PRN
  Administered 2019-02-10: 10 mL
  Filled 2019-02-10: qty 10

## 2019-02-10 MED ORDER — DIPHENHYDRAMINE HCL 25 MG PO CAPS
ORAL_CAPSULE | ORAL | Status: AC
  Filled 2019-02-10: qty 2

## 2019-02-10 MED ORDER — DIPHENHYDRAMINE HCL 25 MG PO CAPS
50.0000 mg | ORAL_CAPSULE | Freq: Once | ORAL | Status: AC
  Administered 2019-02-10: 50 mg via ORAL

## 2019-02-10 MED ORDER — PALONOSETRON HCL INJECTION 0.25 MG/5ML
INTRAVENOUS | Status: AC
  Filled 2019-02-10: qty 5

## 2019-02-10 MED ORDER — ANTICOAGULANT SODIUM CITRATE 4% (200MG/5ML) IV SOLN
5.0000 mL | Status: DC | PRN
  Administered 2019-02-10: 5 mL via INTRAVENOUS
  Filled 2019-02-10: qty 5

## 2019-02-10 MED ORDER — SODIUM CHLORIDE 0.9 % IV SOLN
Freq: Once | INTRAVENOUS | Status: AC
  Filled 2019-02-10: qty 150

## 2019-02-10 MED ORDER — SODIUM CHLORIDE 0.9 % IV SOLN
Freq: Once | INTRAVENOUS | Status: AC
  Filled 2019-02-10: qty 250

## 2019-02-10 MED ORDER — ACETAMINOPHEN 325 MG PO TABS
ORAL_TABLET | ORAL | Status: AC
  Filled 2019-02-10: qty 2

## 2019-02-10 NOTE — Patient Instructions (Signed)
[facility] Center Discharge Instructions for Patients Receiving Chemotherapy  Today you received the following chemotherapy agents: Trastuzumab, Pertuzumab, Taxotere, Carboplatin   To help prevent nausea and vomiting after your treatment, we encourage you to take your nausea medication as directed.    If you develop nausea and vomiting that is not controlled by your nausea medication, call the clinic.   BELOW ARE SYMPTOMS THAT SHOULD BE REPORTED IMMEDIATELY:  *FEVER GREATER THAN 100.5 F  *CHILLS WITH OR WITHOUT FEVER  NAUSEA AND VOMITING THAT IS NOT CONTROLLED WITH YOUR NAUSEA MEDICATION  *UNUSUAL SHORTNESS OF BREATH  *UNUSUAL BRUISING OR BLEEDING  TENDERNESS IN MOUTH AND THROAT WITH OR WITHOUT PRESENCE OF ULCERS  *URINARY PROBLEMS  *BOWEL PROBLEMS  UNUSUAL RASH Items with * indicate a potential emergency and should be followed up as soon as possible.  Feel free to call the clinic should you have any questions or concerns. The clinic phone number is [phone].  Please show the CHEMO ALERT CARD at check-in to the Emergency Department and triage nurse.   

## 2019-02-10 NOTE — Assessment & Plan Note (Signed)
10/26/2018:Patient palpated a left breast mass x 5 weeks. Mammogram showed a 2.8cm mass with calcifications spanning 3.2cm in the left breast and two mildly thickened axillary lymph nodes up to 0.4cm. Biopsy showed IDC, grade 3, HER-2 + (3+), ER/PR -, Ki67 50%, and no evidence of carcinoma in the left axilla.  Treatment plan: 1. Neoadjuvant chemotherapy with TCH Perjeta 6 cycles followed by HerceptinPerjetamaintenance for 1 year 2. Followed by breast conserving surgerywith sentinel lymph node study 3. Followed by adjuvant radiation therapy ---------------------------------------------------------------------------------------------------------------------------------------------- Current treatment: [place] reviewed Echocardiogram: 11/05/2018: EF 65 to 70% Patient is diabetic and we are anxious about her blood sugars.  Chemo toxicities: 1.Nausea and vomiting:Resolved 2.Diarrhea:On Imodium. 3.Chemotherapy-induced anemia: Today's hemoglobin is   4.Severe anxiety issues:She is having anxiety issues better 5.Hypokalemia due to diarrhea.  On oral potassium daily   Return to clinic in 3 weeks for cycle6

## 2019-02-10 NOTE — Progress Notes (Signed)
[facility] Spiritual Care Note  Followed up with Erin Lawson in infusion as planned. She is using gratitude, prayer, and perspective to cope, finding new sources of encouragement in the midst of limitations due to covid precations (such as watching an inspirational speaker online in lieu of in-person church). This is a testament to her resilience. Since her last appointment at [facility], Erin Lawson has lost two aunts, so grief can weigh heavily on her. Her sister has been a source of connection, encouragement, and even delightfully prepared veggies. Provided empathic listening, bereavement support, pastoral reflection, and encouragement. Also mailing a handwritten sympathy card. Plan to follw up at her last infusion.   [place], [place], [facility] Pager [phone] Voicemail [phone]

## 2019-02-12 ENCOUNTER — Other Ambulatory Visit: Payer: Self-pay

## 2019-02-12 ENCOUNTER — Inpatient Hospital Stay: Payer: 59

## 2019-02-12 VITALS — BP 135/65 | HR 65 | Temp 98.7°F | Resp 18

## 2019-02-12 DIAGNOSIS — C50212 Malignant neoplasm of upper-inner quadrant of left female breast: Secondary | ICD-10-CM

## 2019-02-12 DIAGNOSIS — Z5112 Encounter for antineoplastic immunotherapy: Secondary | ICD-10-CM | POA: Diagnosis not present

## 2019-02-12 DIAGNOSIS — Z171 Estrogen receptor negative status [ER-]: Secondary | ICD-10-CM

## 2019-02-12 MED ORDER — PEGFILGRASTIM INJECTION 6 MG/0.6ML ~~LOC~~
PREFILLED_SYRINGE | SUBCUTANEOUS | Status: AC
  Filled 2019-02-12: qty 0.6

## 2019-02-12 MED ORDER — PEGFILGRASTIM INJECTION 6 MG/0.6ML ~~LOC~~
6.0000 mg | PREFILLED_SYRINGE | Freq: Once | SUBCUTANEOUS | Status: AC
  Administered 2019-02-12: 6 mg via SUBCUTANEOUS

## 2019-02-17 ENCOUNTER — Telehealth: Payer: Self-pay | Admitting: Hematology and Oncology

## 2019-02-17 NOTE — Telephone Encounter (Signed)
Rescheduled per provider. Called and spoke with pt, confirmed 2/19 and 2/22 appt

## 2019-02-24 ENCOUNTER — Other Ambulatory Visit: Payer: 59

## 2019-02-24 ENCOUNTER — Ambulatory Visit: Payer: 59 | Admitting: Hematology and Oncology

## 2019-02-24 ENCOUNTER — Ambulatory Visit: Payer: 59

## 2019-02-26 ENCOUNTER — Ambulatory Visit: Payer: 59

## 2019-03-01 ENCOUNTER — Other Ambulatory Visit: Payer: 59

## 2019-03-01 ENCOUNTER — Encounter: Payer: Self-pay | Admitting: *Deleted

## 2019-03-01 ENCOUNTER — Ambulatory Visit: Payer: 59

## 2019-03-01 ENCOUNTER — Ambulatory Visit: Payer: 59 | Admitting: Hematology and Oncology

## 2019-03-03 ENCOUNTER — Ambulatory Visit: Payer: 59

## 2019-03-04 NOTE — Progress Notes (Signed)
Patient Care Team: Forrest Moron, MD as PCP - General (Internal Medicine) Mauro Kaufmann, RN as Oncology Nurse Navigator Rockwell Germany, RN as Oncology Nurse Navigator Erroll Luna, MD as Consulting Physician (General Surgery) Nicholas Lose, MD as Consulting Physician (Hematology and Oncology) Gery Pray, MD as Consulting Physician (Radiation Oncology)  DIAGNOSIS:    ICD-10-CM   1. Malignant neoplasm of upper-inner quadrant of left breast in female, estrogen receptor negative ([place])  C50.212    Z17.1     SUMMARY OF ONCOLOGIC HISTORY:    Oncology History  Malignant neoplasm of upper-inner quadrant of left breast in female, estrogen receptor negative ([place])  10/26/2018 Initial Diagnosis   Patient palpated a left breast mass x 5 weeks. Mammogram showed a 2.8cm mass with calcifications spanning 3.2cm in the left breast and two mildly thickened axillary lymph nodes up to 0.4cm. Biopsy showed IDC, grade 3, HER-2 + (3+), ER/PR -, Ki67 50%, and no evidence of carcinoma in the left axilla.    10/28/2018 Cancer Staging   Staging form: Breast, AJCC 8th Edition - Clinical stage from 10/28/2018: Stage IIA (cT2, cN0, cM0, G2, ER-, PR-, HER2+) - Signed by Nicholas Lose, MD on 10/28/2018   11/12/2018 -  Chemotherapy   The patient had palonosetron (ALOXI) injection 0.25 mg, 0.25 mg, Intravenous,  Once, 4 of 6 cycles Administration: 0.25 mg (11/12/2018), 0.25 mg (12/03/2018), 0.25 mg (12/23/2018), 0.25 mg (01/18/2019) pegfilgrastim (NEULASTA) injection 6 mg, 6 mg, Subcutaneous, Once, 3 of 5 cycles Administration: 6 mg (12/05/2018), 6 mg (12/25/2018), 6 mg (01/20/2019) pegfilgrastim-jmdb (FULPHILA) injection 6 mg, 6 mg, Subcutaneous,  Once, 1 of 1 cycle Administration: 6 mg (11/14/2018) CARBOplatin (PARAPLATIN) 500 mg in sodium chloride 0.9 % 250 mL chemo infusion, 500 mg (101.8 % of original dose 493 mg), Intravenous,  Once, 4 of 6 cycles Dose modification:   (original dose 493  mg, Cycle 1, Reason: Provider Judgment) Administration: 500 mg (11/12/2018), 420 mg (12/03/2018), 420 mg (12/23/2018), 420 mg (01/18/2019) DOCEtaxel (TAXOTERE) 110 mg in sodium chloride 0.9 % 250 mL chemo infusion, 65 mg/m2 = 110 mg (100 % of original dose 65 mg/m2), Intravenous,  Once, 4 of 6 cycles Dose modification: 65 mg/m2 (original dose 65 mg/m2, Cycle 1, Reason: Provider Judgment), 50 mg/m2 (original dose 65 mg/m2, Cycle 2, Reason: Dose not tolerated), 40 mg/m2 (original dose 65 mg/m2, Cycle 5, Reason: Dose not tolerated) Administration: 110 mg (11/12/2018), 80 mg (12/03/2018), 70 mg (12/23/2018), 70 mg (01/18/2019) pertuzumab (PERJETA) 420 mg in sodium chloride 0.9 % 250 mL chemo infusion, 420 mg (100 % of original dose 420 mg), Intravenous, Once, 4 of 6 cycles Dose modification: 420 mg (original dose 420 mg, Cycle 1, Reason: Provider Judgment) Administration: 420 mg (11/12/2018), 420 mg (12/03/2018), 420 mg (12/23/2018), 420 mg (01/18/2019) fosaprepitant (EMEND) 150 mg, dexamethasone (DECADRON) 12 mg in sodium chloride 0.9 % 145 mL IVPB, , Intravenous,  Once, 4 of 6 cycles Administration:  (11/12/2018),  (12/03/2018),  (12/23/2018),  (01/18/2019) trastuzumab-anns (KANJINTI) 399 mg in sodium chloride 0.9 % 250 mL chemo infusion, 6 mg/kg = 399 mg (100 % of original dose 6 mg/kg), Intravenous,  Once, 3 of 5 cycles Dose modification: 6 mg/kg (original dose 6 mg/kg, Cycle 2, Reason: Other (see comments), Comment: J-code in auth referral is for Kanjinti) Administration: 399 mg (12/03/2018), 399 mg (12/23/2018), 399 mg (01/18/2019) trastuzumab-dkst (OGIVRI) 525 mg in sodium chloride 0.9 % 250 mL chemo infusion, 8 mg/kg = 525 mg, Intravenous,  Once, 1 of 1 cycle  Administration: 525 mg (11/12/2018)  for chemotherapy treatment.      CURRENT THERAPY: Cycle #5 of TCH (taxotere, carboplatin, Herceptin) + Perjeta  INTERVAL HISTORY: Erin Lawson 59 y.o. female returns to the clinic for a follow up visit.  The patient is feeling fatigued today without any concerning complaints. She has tolerated her chemotherapy extremely well without any concerning adverse side effects. Today, she is scheduled for her 6th and final cycle of taxotere, carboplatin, and herceptin, and perjeta before starting her maintenance treatment. She denies any fevers, chills, night sweats, or weight loss. She denies any chest pain, palpitations, shortness of breath, cough, or hemoptysis. She denies peripheral neuropathy. She denies nausea, vomiting, diarrhea, or constipation. She is scheduled for her breast MRI next week on 03/11/2019. She is here for evaluation before starting cycle #6.   MEDICAL HISTORY: Past Medical History:  Diagnosis Date  . Anemia   . Anxiety   . Cancer ([place]) 10/2018   left breast IDC  . DM type 2 (diabetes mellitus, type 2) ([place]) 05/18/2012  . GERD (gastroesophageal reflux disease)    OTC prn  . Hypertension     ALLERGIES:  is allergic to atenolol; poractant alfa; pork-derived products; and meloxicam.  MEDICATIONS:  Current Outpatient Medications  Medication Sig Dispense Refill  . amLODipine (NORVASC) 5 MG tablet Take 1 tablet (5 mg total) by mouth every other day. 90 tablet 1  . blood glucose meter kit and supplies KIT Dispense based on patient and insurance preference. Use up to four times daily as directed. (FOR ICD-9 250.00, 250.01). 1 each 0  . blood glucose meter kit and supplies Dispense based on patient and insurance preference. Use up to four times daily as directed. (FOR ICD-10 E10.9, E11.9). 1 each 0  . Ferrous Sulfate (IRON PO) Take by mouth. 1 teaspoon daily    . glucose blood (CONTOUR NEXT TEST) test strip Check sugar once daily  Dx:  DMII controlled non-insulin dependent. 100 each 3  . KLOR-CON M20 20 MEQ tablet TAKE 1 TABLET BY MOUTH TWICE A DAY 180 tablet 1  . LANCETS ULTRA FINE MISC Check sugar up one time daily dx: new onset DMII uncontrolled non-insulin 100 each 3  .  lidocaine-prilocaine (EMLA) cream Apply to affected area once 30 g 3  . LORazepam (ATIVAN) 0.5 MG tablet Take 1 tablet (0.5 mg total) by mouth at bedtime as needed for sleep. 30 tablet 0  . ondansetron (ZOFRAN) 8 MG tablet Take 1 tablet (8 mg total) by mouth 2 (two) times daily as needed (Nausea or vomiting). Begin 4 days after chemotherapy. 30 tablet 1  . prochlorperazine (COMPAZINE) 10 MG tablet Take 1 tablet (10 mg total) by mouth every 6 (six) hours as needed (Nausea or vomiting). 30 tablet 1   No current facility-administered medications for this visit.    SURGICAL HISTORY:  Past Surgical History:  Procedure Laterality Date  . CESAREAN SECTION    . PORTACATH PLACEMENT Right 11/11/2018   Procedure: INSERTION PORT-A-CATH WITH ULTRASOUND;  Surgeon: Erroll Luna, MD;  Location: [facility];  Service: General;  Laterality: Right;  . utreine polyp     resected      PHYSICAL EXAMINATION:  Blood pressure (!) 157/66, pulse 70, temperature 98.7 F (37.1 C), temperature source Temporal, resp. rate 18, height _0  (1.549 m), weight 144 lb 4.8 oz (65.5 kg), SpO2 99 %.  ECOG PERFORMANCE STATUS: 1 - Symptomatic but completely ambulatory   LABORATORY DATA: Lab Results  Component Value Date   WBC 8.9 03/05/2019   HGB 9.5 (L) 03/05/2019   HCT 29.7 (L) 03/05/2019   MCV 78.8 (L) 03/05/2019   PLT 218 03/05/2019      Chemistry      Component Value Date/Time   NA 142 02/10/2019 0830   NA 140 10/12/2018 1219   K 3.5 02/10/2019 0830   CL 106 02/10/2019 0830   CO2 26 02/10/2019 0830   BUN 13 02/10/2019 0830   BUN 10 10/12/2018 1219   CREATININE 0.73 02/10/2019 0830   CREATININE 0.66 12/01/2015 1509      Component Value Date/Time   CALCIUM 8.9 02/10/2019 0830   ALKPHOS 144 (H) 02/10/2019 0830   AST 16 02/10/2019 0830   ALT 16 02/10/2019 0830   BILITOT 0.3 02/10/2019 0830         ASSESSMENT/PLAN:  Malignant neoplasm of upper-inner quadrant of left breast in  female, estrogen receptor negative ([place]) 10/26/2018:Patient palpated a left breast mass x 5 weeks. Mammogram showed a 2.8cm mass with calcifications spanning 3.2cm in the left breast and two mildly thickened axillary lymph nodes up to 0.4cm. Biopsy showed IDC, grade 3, HER-2 + (3+), ER/PR -, Ki67 50%, and no evidence of carcinoma in the left axilla.  Treatment plan: 1. Neoadjuvant chemotherapy with TCH Perjeta 6 cycles (completed on 03/05/19) followed by HerceptinPerjetamaintenance for 1 year   2. Followed by breast conserving surgerywith sentinel lymph node study 3. Followed by adjuvant radiation therapy   Current treatment: [place] reviewed Echocardiogram: 11/05/2018: EF 65 to 70% Patient is diabetic and we are anxious about her blood sugars.  Chemo toxicities: 1.Nausea and vomiting:Resolved 2.Diarrhea:On Imodium. 3.Chemotherapy-induced anemia:Today's hemoglobin is   9.5 4.Severe anxiety issues:She is having anxiety issues better 5.Hypokalemia due to diarrhea.  On oral potassium daily  This is a very pleasant 59 year old [place] American female diagnosed with stage IIA HER2 positive breast cancer of the left breast. She is here today to receive cycle #6 of [place] today. Labs were reviewed and are acceptable for treatment. She will receive cycle #6 as scheduled today.   She will have her breast MRI performed next week as instructed.   Return to clinic after the MRI to discuss results She will need to be set up every 3 weeks for Herceptin Perjeta maintenance  No orders of the defined types were placed in this encounter.    Harriette Ohara, PA-C 03/05/19

## 2019-03-05 ENCOUNTER — Encounter: Payer: Self-pay | Admitting: *Deleted

## 2019-03-05 ENCOUNTER — Inpatient Hospital Stay: Payer: 59

## 2019-03-05 ENCOUNTER — Inpatient Hospital Stay: Payer: 59 | Attending: Hematology and Oncology

## 2019-03-05 ENCOUNTER — Other Ambulatory Visit: Payer: Self-pay | Admitting: *Deleted

## 2019-03-05 ENCOUNTER — Encounter: Payer: Self-pay | Admitting: General Practice

## 2019-03-05 ENCOUNTER — Inpatient Hospital Stay (HOSPITAL_BASED_OUTPATIENT_CLINIC_OR_DEPARTMENT_OTHER): Payer: 59 | Admitting: Hematology and Oncology

## 2019-03-05 ENCOUNTER — Other Ambulatory Visit: Payer: Self-pay

## 2019-03-05 VITALS — BP 157/66 | HR 70 | Temp 98.7°F | Resp 18 | Ht 61.0 in | Wt 144.3 lb

## 2019-03-05 DIAGNOSIS — D6481 Anemia due to antineoplastic chemotherapy: Secondary | ICD-10-CM | POA: Diagnosis not present

## 2019-03-05 DIAGNOSIS — C50212 Malignant neoplasm of upper-inner quadrant of left female breast: Secondary | ICD-10-CM | POA: Diagnosis not present

## 2019-03-05 DIAGNOSIS — Z171 Estrogen receptor negative status [ER-]: Secondary | ICD-10-CM

## 2019-03-05 DIAGNOSIS — Z5111 Encounter for antineoplastic chemotherapy: Secondary | ICD-10-CM | POA: Insufficient documentation

## 2019-03-05 DIAGNOSIS — Z5189 Encounter for other specified aftercare: Secondary | ICD-10-CM | POA: Diagnosis not present

## 2019-03-05 DIAGNOSIS — I10 Essential (primary) hypertension: Secondary | ICD-10-CM | POA: Insufficient documentation

## 2019-03-05 DIAGNOSIS — E119 Type 2 diabetes mellitus without complications: Secondary | ICD-10-CM | POA: Insufficient documentation

## 2019-03-05 DIAGNOSIS — K219 Gastro-esophageal reflux disease without esophagitis: Secondary | ICD-10-CM | POA: Diagnosis not present

## 2019-03-05 DIAGNOSIS — Z95828 Presence of other vascular implants and grafts: Secondary | ICD-10-CM

## 2019-03-05 DIAGNOSIS — Z79899 Other long term (current) drug therapy: Secondary | ICD-10-CM

## 2019-03-05 DIAGNOSIS — Z5112 Encounter for antineoplastic immunotherapy: Secondary | ICD-10-CM | POA: Insufficient documentation

## 2019-03-05 DIAGNOSIS — Z5181 Encounter for therapeutic drug level monitoring: Secondary | ICD-10-CM

## 2019-03-05 LAB — CMP (CANCER CENTER ONLY)
ALT: 17 U/L (ref 0–44)
AST: 14 U/L — ABNORMAL LOW (ref 15–41)
Albumin: 3.9 g/dL (ref 3.5–5.0)
Alkaline Phosphatase: 129 U/L — ABNORMAL HIGH (ref 38–126)
Anion gap: 8 (ref 5–15)
BUN: 16 mg/dL (ref 6–20)
CO2: 26 mmol/L (ref 22–32)
Calcium: 9.4 mg/dL (ref 8.9–10.3)
Chloride: 106 mmol/L (ref 98–111)
Creatinine: 0.71 mg/dL (ref 0.44–1.00)
GFR, Est AFR Am: >60 mL/min (ref >60)
GFR, Estimated: >60 mL/min (ref >60)
Glucose, Bld: 115 mg/dL — ABNORMAL HIGH (ref 70–99)
Potassium: 4.2 mmol/L (ref 3.5–5.1)
Sodium: 140 mmol/L (ref 135–145)
Total Bilirubin: 0.2 mg/dL — ABNORMAL LOW (ref 0.3–1.2)
Total Protein: 6.7 g/dL (ref 6.5–8.1)

## 2019-03-05 LAB — CBC WITH DIFFERENTIAL (CANCER CENTER ONLY)
Abs Immature Granulocytes: 0.03 10*3/uL (ref 0.00–0.07)
Basophils Absolute: 0 10*3/uL (ref 0.0–0.1)
Basophils Relative: 0 %
Eosinophils Absolute: 0.1 10*3/uL (ref 0.0–0.5)
Eosinophils Relative: 1 %
HCT: 29.7 % — ABNORMAL LOW (ref 36.0–46.0)
Hemoglobin: 9.5 g/dL — ABNORMAL LOW (ref 12.0–15.0)
Immature Granulocytes: 0 %
Lymphocytes Relative: 35 %
Lymphs Abs: 3.2 10*3/uL (ref 0.7–4.0)
MCH: 25.2 pg — ABNORMAL LOW (ref 26.0–34.0)
MCHC: 32 g/dL (ref 30.0–36.0)
MCV: 78.8 fL — ABNORMAL LOW (ref 80.0–100.0)
Monocytes Absolute: 0.6 10*3/uL (ref 0.1–1.0)
Monocytes Relative: 7 %
Neutro Abs: 5.1 10*3/uL (ref 1.7–7.7)
Neutrophils Relative %: 57 %
Platelet Count: 218 10*3/uL (ref 150–400)
RBC: 3.77 MIL/uL — ABNORMAL LOW (ref 3.87–5.11)
RDW: 16.2 % — ABNORMAL HIGH (ref 11.5–15.5)
WBC Count: 8.9 10*3/uL (ref 4.0–10.5)
nRBC: 0 % (ref 0.0–0.2)

## 2019-03-05 MED ORDER — PALONOSETRON HCL INJECTION 0.25 MG/5ML
INTRAVENOUS | Status: AC
  Filled 2019-03-05: qty 5

## 2019-03-05 MED ORDER — ACETAMINOPHEN 325 MG PO TABS
650.0000 mg | ORAL_TABLET | Freq: Once | ORAL | Status: AC
  Administered 2019-03-05: 650 mg via ORAL

## 2019-03-05 MED ORDER — SODIUM CHLORIDE 0.9% FLUSH
10.0000 mL | INTRAVENOUS | Status: DC | PRN
  Administered 2019-03-05 (×2): 10 mL
  Filled 2019-03-05: qty 10

## 2019-03-05 MED ORDER — DIPHENHYDRAMINE HCL 25 MG PO CAPS
ORAL_CAPSULE | ORAL | Status: AC
  Filled 2019-03-05: qty 2

## 2019-03-05 MED ORDER — DIPHENHYDRAMINE HCL 25 MG PO CAPS
50.0000 mg | ORAL_CAPSULE | Freq: Once | ORAL | Status: AC
  Administered 2019-03-05: 50 mg via ORAL

## 2019-03-05 MED ORDER — PALONOSETRON HCL INJECTION 0.25 MG/5ML
0.2500 mg | Freq: Once | INTRAVENOUS | Status: AC
  Administered 2019-03-05: 0.25 mg via INTRAVENOUS

## 2019-03-05 MED ORDER — SODIUM CHLORIDE 0.9 % IV SOLN
40.0000 mg/m2 | Freq: Once | INTRAVENOUS | Status: AC
  Administered 2019-03-05: 14:00:00 70 mg via INTRAVENOUS
  Filled 2019-03-05: qty 7

## 2019-03-05 MED ORDER — ANTICOAGULANT SODIUM CITRATE 4% (200MG/5ML) IV SOLN
5.0000 mL | Status: DC | PRN
  Administered 2019-03-05: 5 mL
  Filled 2019-03-05: qty 5

## 2019-03-05 MED ORDER — SODIUM CHLORIDE 0.9 % IV SOLN
420.0000 mg | Freq: Once | INTRAVENOUS | Status: AC
  Administered 2019-03-05: 420 mg via INTRAVENOUS
  Filled 2019-03-05: qty 14

## 2019-03-05 MED ORDER — SODIUM CHLORIDE 0.9 % IV SOLN
416.4000 mg | Freq: Once | INTRAVENOUS | Status: AC
  Administered 2019-03-05: 420 mg via INTRAVENOUS
  Filled 2019-03-05: qty 42

## 2019-03-05 MED ORDER — SODIUM CHLORIDE 0.9 % IV SOLN
Freq: Once | INTRAVENOUS | Status: AC
  Filled 2019-03-05: qty 250

## 2019-03-05 MED ORDER — SODIUM CHLORIDE 0.9% FLUSH
10.0000 mL | INTRAVENOUS | Status: DC | PRN
  Administered 2019-03-05: 10 mL
  Filled 2019-03-05: qty 10

## 2019-03-05 MED ORDER — TRASTUZUMAB-ANNS CHEMO 150 MG IV SOLR
6.0000 mg/kg | Freq: Once | INTRAVENOUS | Status: AC
  Administered 2019-03-05: 399 mg via INTRAVENOUS
  Filled 2019-03-05: qty 19

## 2019-03-05 MED ORDER — ACETAMINOPHEN 325 MG PO TABS
ORAL_TABLET | ORAL | Status: AC
  Filled 2019-03-05: qty 2

## 2019-03-05 MED ORDER — SODIUM CHLORIDE 0.9 % IV SOLN
Freq: Once | INTRAVENOUS | Status: AC
  Filled 2019-03-05: qty 150

## 2019-03-05 NOTE — Patient Instructions (Signed)
[facility] Discharge Instructions for Patients Receiving Chemotherapy  Today you received the following chemotherapy agents:  Trastuzumab, Pertuzumab, Docetaxel, Carboplatin  To help prevent nausea and vomiting after your treatment, we encourage you to take your nausea medication as prescribed.   If you develop nausea and vomiting that is not controlled by your nausea medication, call the clinic.   BELOW ARE SYMPTOMS THAT SHOULD BE REPORTED IMMEDIATELY:  *FEVER GREATER THAN 100.5 F  *CHILLS WITH OR WITHOUT FEVER  NAUSEA AND VOMITING THAT IS NOT CONTROLLED WITH YOUR NAUSEA MEDICATION  *UNUSUAL SHORTNESS OF BREATH  *UNUSUAL BRUISING OR BLEEDING  TENDERNESS IN MOUTH AND THROAT WITH OR WITHOUT PRESENCE OF ULCERS  *URINARY PROBLEMS  *BOWEL PROBLEMS  UNUSUAL RASH Items with * indicate a potential emergency and should be followed up as soon as possible.  Feel free to call the clinic should you have any questions or concerns. The clinic phone number is [phone].  Please show the [facility] at check-in to the Emergency Department and triage nurse.

## 2019-03-05 NOTE — Progress Notes (Signed)
[facility] Spiritual Care Note  Ms Kovalev was asleep in in fusion when I attempted to visit. Passed along greetings and congratulations on her last treatment for her chemo nurse Elmyra Ricks Ammons/RN to share. Ms Gochnour is aware of ongoing Support Team availability as needs arise.   [place], [place], [facility] Pager [phone] Voicemail [phone]

## 2019-03-05 NOTE — Progress Notes (Signed)
Per Dr Lindi Adie OK to proceed with treatment today with echo from Oct 2020.  Pt scheduled for echo on 03/08/19

## 2019-03-08 ENCOUNTER — Telehealth: Payer: Self-pay | Admitting: Hematology and Oncology

## 2019-03-08 ENCOUNTER — Inpatient Hospital Stay: Payer: 59

## 2019-03-08 ENCOUNTER — Other Ambulatory Visit: Payer: 59

## 2019-03-08 ENCOUNTER — Ambulatory Visit (HOSPITAL_COMMUNITY): Admission: RE | Admit: 2019-03-08 | Payer: 59 | Source: Ambulatory Visit

## 2019-03-08 ENCOUNTER — Encounter: Payer: Self-pay | Admitting: *Deleted

## 2019-03-08 ENCOUNTER — Other Ambulatory Visit: Payer: Self-pay

## 2019-03-08 VITALS — BP 158/68 | HR 85 | Temp 97.5°F | Resp 20

## 2019-03-08 DIAGNOSIS — Z171 Estrogen receptor negative status [ER-]: Secondary | ICD-10-CM

## 2019-03-08 DIAGNOSIS — C50212 Malignant neoplasm of upper-inner quadrant of left female breast: Secondary | ICD-10-CM

## 2019-03-08 DIAGNOSIS — Z5112 Encounter for antineoplastic immunotherapy: Secondary | ICD-10-CM | POA: Diagnosis not present

## 2019-03-08 MED ORDER — PEGFILGRASTIM INJECTION 6 MG/0.6ML ~~LOC~~
6.0000 mg | PREFILLED_SYRINGE | Freq: Once | SUBCUTANEOUS | Status: AC
  Administered 2019-03-08: 6 mg via SUBCUTANEOUS

## 2019-03-08 MED ORDER — PEGFILGRASTIM INJECTION 6 MG/0.6ML ~~LOC~~
PREFILLED_SYRINGE | SUBCUTANEOUS | Status: AC
  Filled 2019-03-08: qty 0.6

## 2019-03-08 NOTE — Patient Instructions (Signed)

## 2019-03-08 NOTE — Telephone Encounter (Signed)
I talk with patient regarding schedule  

## 2019-03-11 ENCOUNTER — Ambulatory Visit
Admission: RE | Admit: 2019-03-11 | Discharge: 2019-03-11 | Disposition: A | Discharge status: Home / Self Care | Payer: 59 | Source: Ambulatory Visit | Attending: Hematology and Oncology | Admitting: Hematology and Oncology

## 2019-03-11 ENCOUNTER — Other Ambulatory Visit: Payer: Self-pay

## 2019-03-11 DIAGNOSIS — Z171 Estrogen receptor negative status [ER-]: Secondary | ICD-10-CM

## 2019-03-11 DIAGNOSIS — C50212 Malignant neoplasm of upper-inner quadrant of left female breast: Secondary | ICD-10-CM

## 2019-03-11 MED ORDER — GADOBUTROL 1 MMOL/ML IV SOLN
7.0000 mL | Freq: Once | INTRAVENOUS | Status: AC | PRN
  Administered 2019-03-11: 7 mL via INTRAVENOUS

## 2019-03-11 NOTE — Progress Notes (Signed)
Patient Care Team: Forrest Moron, MD as PCP - General (Internal Medicine) Mauro Kaufmann, RN as Oncology Nurse Navigator Rockwell Germany, RN as Oncology Nurse Navigator Erroll Luna, MD as Consulting Physician (General Surgery) Nicholas Lose, MD as Consulting Physician (Hematology and Oncology) Gery Pray, MD as Consulting Physician (Radiation Oncology)  DIAGNOSIS:    ICD-10-CM   1. Malignant neoplasm of upper-inner quadrant of left breast in female, estrogen receptor negative ([place])  C50.212    Z17.1     SUMMARY OF ONCOLOGIC HISTORY: Oncology History  Malignant neoplasm of upper-inner quadrant of left breast in female, estrogen receptor negative ([place])  10/26/2018 Initial Diagnosis   Patient palpated a left breast mass x 5 weeks. Mammogram showed a 2.8cm mass with calcifications spanning 3.2cm in the left breast and two mildly thickened axillary lymph nodes up to 0.4cm. Biopsy showed IDC, grade 3, HER-2 + (3+), ER/PR -, Ki67 50%, and no evidence of carcinoma in the left axilla.    10/28/2018 Cancer Staging   Staging form: Breast, AJCC 8th Edition - Clinical stage from 10/28/2018: Stage IIA (cT2, cN0, cM0, G2, ER-, PR-, HER2+) - Signed by Nicholas Lose, MD on 10/28/2018   11/12/2018 -  Chemotherapy   The patient had palonosetron (ALOXI) injection 0.25 mg, 0.25 mg, Intravenous,  Once, 6 of 6 cycles Administration: 0.25 mg (11/12/2018), 0.25 mg (12/03/2018), 0.25 mg (02/10/2019), 0.25 mg (03/05/2019), 0.25 mg (12/23/2018), 0.25 mg (01/18/2019) pegfilgrastim (NEULASTA) injection 6 mg, 6 mg, Subcutaneous, Once, 5 of 5 cycles Administration: 6 mg (12/05/2018), 6 mg (12/25/2018), 6 mg (01/20/2019), 6 mg (02/12/2019), 6 mg (03/08/2019) pegfilgrastim-jmdb (FULPHILA) injection 6 mg, 6 mg, Subcutaneous,  Once, 1 of 1 cycle Administration: 6 mg (11/14/2018) CARBOplatin (PARAPLATIN) 500 mg in sodium chloride 0.9 % 250 mL chemo infusion, 500 mg (101.8 % of original dose 493 mg), Intravenous,   Once, 6 of 6 cycles Dose modification:   (original dose 493 mg, Cycle 1, Reason: Provider Judgment) Administration: 500 mg (11/12/2018), 420 mg (12/03/2018), 420 mg (02/10/2019), 420 mg (03/05/2019), 420 mg (12/23/2018), 420 mg (01/18/2019) DOCEtaxel (TAXOTERE) 110 mg in sodium chloride 0.9 % 250 mL chemo infusion, 65 mg/m2 = 110 mg (100 % of original dose 65 mg/m2), Intravenous,  Once, 6 of 6 cycles Dose modification: 65 mg/m2 (original dose 65 mg/m2, Cycle 1, Reason: Provider Judgment), 50 mg/m2 (original dose 65 mg/m2, Cycle 2, Reason: Dose not tolerated), 40 mg/m2 (original dose 65 mg/m2, Cycle 5, Reason: Dose not tolerated) Administration: 110 mg (11/12/2018), 80 mg (12/03/2018), 70 mg (02/10/2019), 70 mg (03/05/2019), 70 mg (12/23/2018), 70 mg (01/18/2019) pertuzumab (PERJETA) 420 mg in sodium chloride 0.9 % 250 mL chemo infusion, 420 mg (100 % of original dose 420 mg), Intravenous, Once, 6 of 6 cycles Dose modification: 420 mg (original dose 420 mg, Cycle 1, Reason: Provider Judgment) Administration: 420 mg (11/12/2018), 420 mg (12/03/2018), 420 mg (02/10/2019), 420 mg (03/05/2019), 420 mg (12/23/2018), 420 mg (01/18/2019) fosaprepitant (EMEND) 150 mg, dexamethasone (DECADRON) 12 mg in sodium chloride 0.9 % 145 mL IVPB, , Intravenous,  Once, 6 of 6 cycles Administration:  (11/12/2018),  (12/03/2018),  (02/10/2019),  (03/05/2019),  (12/23/2018),  (01/18/2019) trastuzumab-anns (KANJINTI) 399 mg in sodium chloride 0.9 % 250 mL chemo infusion, 6 mg/kg = 399 mg (100 % of original dose 6 mg/kg), Intravenous,  Once, 5 of 5 cycles Dose modification: 6 mg/kg (original dose 6 mg/kg, Cycle 2, Reason: Other (see comments), Comment: J-code in auth referral is for Kanjinti) Administration: 399 mg (12/03/2018),  399 mg (12/23/2018), 399 mg (01/18/2019), 399 mg (02/10/2019), 399 mg (03/05/2019) trastuzumab-dkst (OGIVRI) 525 mg in sodium chloride 0.9 % 250 mL chemo infusion, 8 mg/kg = 525 mg, Intravenous,  Once, 1 of 1  cycle Administration: 525 mg (11/12/2018)  for chemotherapy treatment.      CHIEF COMPLIANT: Follow-up of left breast cancer to review MRI  INTERVAL HISTORY: Erin Lawson is a 59 y.o. with above-mentioned history of left breast cancer who completed neoadjuvant chemotherapy with TCH Perjeta.Breast MRI on 03/11/19 showed remarkable response to neoadjuvant chemotherapy with only minimal non-mass enhancement left behind.  She continues to feel mild fatigue but denies neuropathy.  Denies nausea vomiting.  She is complaining of some pain in the legs especially the left thigh area.  She presents to the clinic todayto review her MRI and discuss further treatment.   ALLERGIES:  is allergic to atenolol; poractant alfa; pork-derived products; and meloxicam.  MEDICATIONS:  Current Outpatient Medications  Medication Sig Dispense Refill  . amLODipine (NORVASC) 5 MG tablet Take 1 tablet (5 mg total) by mouth every other day. 90 tablet 1  . blood glucose meter kit and supplies KIT Dispense based on patient and insurance preference. Use up to four times daily as directed. (FOR ICD-9 250.00, 250.01). 1 each 0  . blood glucose meter kit and supplies Dispense based on patient and insurance preference. Use up to four times daily as directed. (FOR ICD-10 E10.9, E11.9). 1 each 0  . Ferrous Sulfate (IRON PO) Take by mouth. 1 teaspoon daily    . glucose blood (CONTOUR NEXT TEST) test strip Check sugar once daily  Dx:  DMII controlled non-insulin dependent. 100 each 3  . KLOR-CON M20 20 MEQ tablet TAKE 1 TABLET BY MOUTH TWICE A DAY 180 tablet 1  . LANCETS ULTRA FINE MISC Check sugar up one time daily dx: new onset DMII uncontrolled non-insulin 100 each 3  . lidocaine-prilocaine (EMLA) cream Apply to affected area once 30 g 3  . LORazepam (ATIVAN) 0.5 MG tablet Take 1 tablet (0.5 mg total) by mouth at bedtime as needed for sleep. 30 tablet 0  . ondansetron (ZOFRAN) 8 MG tablet Take 1 tablet (8 mg total) by  mouth 2 (two) times daily as needed (Nausea or vomiting). Begin 4 days after chemotherapy. 30 tablet 1  . prochlorperazine (COMPAZINE) 10 MG tablet Take 1 tablet (10 mg total) by mouth every 6 (six) hours as needed (Nausea or vomiting). 30 tablet 1   No current facility-administered medications for this visit.    PHYSICAL EXAMINATION: ECOG PERFORMANCE STATUS: 1 - Symptomatic but completely ambulatory  Vitals:   03/12/19 0937  BP: (!) 142/64  Pulse: 77  Resp: 18  Temp: 98.2 F (36.8 C)  SpO2: 100%   Filed Weights   03/12/19 0937  Weight: 140 lb 1.6 oz (63.5 kg)    LABORATORY DATA:  I have reviewed the data as listed CMP Latest Ref Rng & Units 03/05/2019 02/10/2019 01/18/2019  Glucose 70 - 99 mg/dL 115(H) 138(H) 180(H)  BUN 6 - 20 mg/dL '16 13 17  ' Creatinine 0.44 - 1.00 mg/dL 0.71 0.73 0.64  Sodium 135 - 145 mmol/L 140 142 140  Potassium 3.5 - 5.1 mmol/L 4.2 3.5 3.7  Chloride 98 - 111 mmol/L 106 106 106  CO2 22 - 32 mmol/L '26 26 24  ' Calcium 8.9 - 10.3 mg/dL 9.4 8.9 9.0  Total Protein 6.5 - 8.1 g/dL 6.7 6.5 6.0(L)  Total Bilirubin 0.3 - 1.2 mg/dL 0.2(L)  0.3 0.4  Alkaline Phos 38 - 126 U/L 129(H) 144(H) 124  AST 15 - 41 U/L 14(L) 16 27  ALT 0 - 44 U/L '17 16 27    ' Lab Results  Component Value Date   WBC 8.9 03/05/2019   HGB 9.5 (L) 03/05/2019   HCT 29.7 (L) 03/05/2019   MCV 78.8 (L) 03/05/2019   PLT 218 03/05/2019   NEUTROABS 5.1 03/05/2019    ASSESSMENT & PLAN:  Malignant neoplasm of upper-inner quadrant of left breast in female, estrogen receptor negative ([place]) 10/26/2018:Patient palpated a left breast mass x 5 weeks. Mammogram showed a 2.8cm mass with calcifications spanning 3.2cm in the left breast and two mildly thickened axillary lymph nodes up to 0.4cm. Biopsy showed IDC, grade 3, HER-2 + (3+), ER/PR -, Ki67 50%, and no evidence of carcinoma in the left axilla.  Treatment plan: 1. Neoadjuvant chemotherapy with TCH Perjeta 6 cycles (completed on 03/05/19)  followed by HerceptinPerjetamaintenance for 1 year   2. Followed by breast conserving surgerywith sentinel lymph node study 3. Followed by adjuvant radiation therapy   Current treatment: Completed 6 cycles of TCH Perjeta, Herceptin Perjeta maintenance will start in March. Breast MRI 03/11/2019: Positive response to neoadjuvant chemo with near complete resolution with only subtle 1.5 cm area of non-mass enhancement.  Radiology review: I discussed the radiology images with the patient and provided her with a copy of the scan report. I sent a message to Dr. Brantley Stage to see her sooner than March 26 appointment.. Return to clinic after surgery to discuss pathology report.    No orders of the defined types were placed in this encounter.  The patient has a good understanding of the overall plan. she agrees with it. she will call with any problems that may develop before the next visit here.  Total time spent: 30 mins including face to face time and time spent for planning, charting and coordination of care  Nicholas Lose, MD 03/12/2019  I, Cloyde Reams Dorshimer, am acting as scribe for Dr. Nicholas Lose.  I have reviewed the above documentation for accuracy and completeness, and I agree with the above.

## 2019-03-12 ENCOUNTER — Other Ambulatory Visit: Payer: Self-pay

## 2019-03-12 ENCOUNTER — Inpatient Hospital Stay (HOSPITAL_BASED_OUTPATIENT_CLINIC_OR_DEPARTMENT_OTHER): Payer: 59 | Admitting: Hematology and Oncology

## 2019-03-12 ENCOUNTER — Encounter: Payer: Self-pay | Admitting: *Deleted

## 2019-03-12 DIAGNOSIS — C50212 Malignant neoplasm of upper-inner quadrant of left female breast: Secondary | ICD-10-CM | POA: Diagnosis not present

## 2019-03-12 DIAGNOSIS — Z171 Estrogen receptor negative status [ER-]: Secondary | ICD-10-CM

## 2019-03-12 DIAGNOSIS — Z5112 Encounter for antineoplastic immunotherapy: Secondary | ICD-10-CM | POA: Diagnosis not present

## 2019-03-12 NOTE — Assessment & Plan Note (Signed)
10/26/2018:Patient palpated a left breast mass x 5 weeks. Mammogram showed a 2.8cm mass with calcifications spanning 3.2cm in the left breast and two mildly thickened axillary lymph nodes up to 0.4cm. Biopsy showed IDC, grade 3, HER-2 + (3+), ER/PR -, Ki67 50%, and no evidence of carcinoma in the left axilla.  Treatment plan: 1. Neoadjuvant chemotherapy with TCH Perjeta 6 cycles (completed on 03/05/19) followed by HerceptinPerjetamaintenance for 1 year   2. Followed by breast conserving surgerywith sentinel lymph node study 3. Followed by adjuvant radiation therapy   Current treatment: Completed 6 cycles of TCH Perjeta, Herceptin Perjeta maintenance will start in March. Breast MRI 03/11/2019: Positive response to neoadjuvant chemo with near complete resolution with only subtle 1.5 cm area of non-mass enhancement.  Radiology review: I discussed the radiology images with the patient and provided her with a copy of the scan report. Patient has appointment to see the surgeon. Return to clinic after surgery to discuss pathology report.

## 2019-03-15 ENCOUNTER — Telehealth: Payer: Self-pay | Admitting: Hematology and Oncology

## 2019-03-15 NOTE — Telephone Encounter (Signed)
I talk with patient regarding schedule  

## 2019-03-22 ENCOUNTER — Encounter: Payer: Self-pay | Admitting: *Deleted

## 2019-03-23 ENCOUNTER — Telehealth: Payer: Self-pay

## 2019-03-23 NOTE — Telephone Encounter (Signed)
Pt. Called with questions about edema that had been popping up, pt. Referred to move appt. Up with Dr.

## 2019-03-24 ENCOUNTER — Telehealth: Payer: Self-pay | Admitting: Family Medicine

## 2019-03-24 ENCOUNTER — Other Ambulatory Visit: Payer: Self-pay

## 2019-03-24 ENCOUNTER — Telehealth (INDEPENDENT_AMBULATORY_CARE_PROVIDER_SITE_OTHER): Payer: 59 | Admitting: Family Medicine

## 2019-03-24 DIAGNOSIS — R609 Edema, unspecified: Secondary | ICD-10-CM | POA: Diagnosis not present

## 2019-03-24 NOTE — Progress Notes (Signed)
CC:  3 month f/u diabetes and swelling on left side that radiates down into her leg.  Swelling starts with long periods of walking and sitting.  Pt advises she is taking ginger and tumeric and it is helping a lot.   No recent weight or bp taken.  Needs refill on amlodipine.  No travel outside the [name] or [place] in the past 3 weeks.

## 2019-03-24 NOTE — Progress Notes (Signed)
Telephone Encounter- SOAP NOTE Established Patient  This telephone encounter was conducted with the patient's (or proxy's) verbal consent via audio telecommunications: yes/no: Yes Patient was instructed to have this encounter in a suitably private space; and to only have persons present to whom they give permission to participate. In addition, patient identity was confirmed by use of name plus two identifiers (DOB and address).  I discussed the limitations, risks, security and privacy concerns of performing an evaluation and management service by telephone and the availability of in person appointments. I also discussed with the patient that there may be a patient responsible charge related to this service. The patient expressed understanding and agreed to proceed.  I spent a total of TIME; 0 MIN TO 60 MIN: 15 minutes talking with the patient or their proxy.  Chief Complaint  Patient presents with  . 3 month f/u diabetes and left side swelling that radiates in    Swelling starts with long periods of walking and sitting.  Pt advises she is taking ginger and tumeric and it is helping a lot.   . Medication Refill    amlodipine    Subjective   Erin Lawson is a 59 y.o. established patient. Telephone visit today for  HPI  Patient reports that she is swelling in her thigh and knee on the left side She went to see Dr. Lindi Adie but it resolved She states that it is intermittent and even up her back on the left side She reports that it felt heavy on the  Left side and on the chest as well She states that she does tumeric and ginger She states that if she walks on it or sits on it the swelling comes back.     Patient Active Problem List   Diagnosis Date Noted  . Port-A-Cath in place 12/23/2018  . Malignant neoplasm of upper-inner quadrant of left breast in female, estrogen receptor negative ([place]) 10/26/2018  . Vitamin D deficiency 12/12/2017  . Primary osteoarthritis of left  knee 12/12/2017  . Chest pain 05/18/2012  . Mild anemia 05/18/2012  . DM type 2 (diabetes mellitus, type 2) ([place]) 05/18/2012  . Essential hypertension 04/06/2007  . KELOID 04/06/2007    Past Medical History:  Diagnosis Date  . Anemia   . Anxiety   . Cancer ([place]) 10/2018   left breast IDC  . DM type 2 (diabetes mellitus, type 2) ([place]) 05/18/2012  . GERD (gastroesophageal reflux disease)    OTC prn  . Hypertension     Current Outpatient Medications  Medication Sig Dispense Refill  . amLODipine (NORVASC) 5 MG tablet Take 1 tablet (5 mg total) by mouth every other day. 90 tablet 1  . blood glucose meter kit and supplies KIT Dispense based on patient and insurance preference. Use up to four times daily as directed. (FOR ICD-9 250.00, 250.01). 1 each 0  . blood glucose meter kit and supplies Dispense based on patient and insurance preference. Use up to four times daily as directed. (FOR ICD-10 E10.9, E11.9). 1 each 0  . glucose blood (CONTOUR NEXT TEST) test strip Check sugar once daily  Dx:  DMII controlled non-insulin dependent. 100 each 3  . LANCETS ULTRA FINE MISC Check sugar up one time daily dx: new onset DMII uncontrolled non-insulin 100 each 3  . lidocaine-prilocaine (EMLA) cream Apply to affected area once 30 g 3  . Ferrous Sulfate (IRON PO) Take by mouth. 1 teaspoon daily    . KLOR-CON M20  20 MEQ tablet TAKE 1 TABLET BY MOUTH TWICE A DAY (Patient not taking: Reported on 03/24/2019) 180 tablet 1  . LORazepam (ATIVAN) 0.5 MG tablet Take 1 tablet (0.5 mg total) by mouth at bedtime as needed for sleep. (Patient not taking: Reported on 03/24/2019) 30 tablet 0  . ondansetron (ZOFRAN) 8 MG tablet Take 1 tablet (8 mg total) by mouth 2 (two) times daily as needed (Nausea or vomiting). Begin 4 days after chemotherapy. (Patient not taking: Reported on 03/24/2019) 30 tablet 1  . prochlorperazine (COMPAZINE) 10 MG tablet Take 1 tablet (10 mg total) by mouth every 6 (six) hours as needed (Nausea or  vomiting). (Patient not taking: Reported on 03/24/2019) 30 tablet 1   No current facility-administered medications for this visit.    Allergies  Allergen Reactions  . Atenolol Other (See Comments)    REACTION: made me feel bad REACTION: made me feel bad  . Poractant Alfa Other (See Comments)    Migraine   . Pork-Derived Products Other (See Comments)    Migraine   . Meloxicam Rash    Social History   Socioeconomic History  . Marital status: Single    Spouse name: Not on file  . Number of children: Not on file  . Years of education: Not on file  . Highest education level: Not on file  Occupational History  . Occupation: cna    Comment: [employer]  Tobacco Use  . Smoking status: Never Smoker  . Smokeless tobacco: Never Used  Substance and Sexual Activity  . Alcohol use: No  . Drug use: No  . Sexual activity: Not on file  Other Topics Concern  . 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 Difficulty of Paying Living Expenses: Not on file  Food Insecurity:   . Worried About Charity fundraiser in the Last Year: Not on file  . Ran Out of Food in the Last Year: Not on file  Transportation Needs:   . Lack of Transportation (Medical): Not on file  . Lack of Transportation (Non-Medical): Not on file  Physical Activity:   . Days of Exercise per Week: Not on file  . Minutes of Exercise per Session: Not on file  Stress:   . Feeling of Stress : Not on file  Social Connections:   . Frequency of Communication with Friends and Family: Not on file  . Frequency of Social Gatherings with Friends and Family: Not on  file  . Attends Religious Services: Not on file  . Active Member of Clubs or Organizations: Not on file  . Attends Archivist Meetings: Not on file  . Marital Status: Not on file  Intimate Partner Violence:   . Fear of Current or Ex-Partner: Not on file  . Emotionally Abused: Not on file  . Physically Abused: Not on file  . Sexually Abused: Not on file    ROS See hpi She denies cough, shortness of breath or wheezing  She is urinating normally and having normal BM She has no fevers or chills   Objective  Unable to do exam as this was a phone visit  Vitals as reported by the patient: There were no vitals filed for this visit.  Keandra was seen today for 3 month f/u diabetes and left side swelling that radiates in and medication refill.  Diagnoses and all orders for this visit:  Peripheral edema  advised pt take pictures  Advised pt to use a tape measure to measure her thighs Follow up with Dr. Lindi Adie  I discussed the assessment and treatment plan with the patient. The patient was provided an opportunity to ask questions and all were answered. The patient agreed with the plan and demonstrated an understanding of the instructions.   The patient was advised to call back or seek an in-person evaluation if the symptoms worsen or if the condition fails to improve as anticipated.  I provided 15 minutes of non-face-to-face time during this encounter.  Forrest Moron, MD  Primary Care at [facility]

## 2019-03-24 NOTE — Patient Instructions (Signed)
° ° ° °  If you have lab work done today you will be contacted with your lab results within the next 2 weeks.  If you have not heard from us then please contact us. The fastest way to get your results is to register for My Chart. ° ° °IF you received an x-ray today, you will receive an invoice from [facility]. Please contact [facility] at [phone] with questions or concerns regarding your invoice.  ° °IF you received labwork today, you will receive an invoice from [facility]. Please contact [facility] at [phone] with questions or concerns regarding your invoice.  ° °Our billing staff will not be able to assist you with questions regarding bills from these companies. ° °You will be contacted with the lab results as soon as they are available. The fastest way to get your results is to activate your My Chart account. Instructions are located on the last page of this paperwork. If you have not heard from us regarding the results in 2 weeks, please contact this office. °  ° ° ° °

## 2019-03-24 NOTE — Telephone Encounter (Signed)
Pt calling to report that she had been waiting for the provider for 50 mins and then bumped her out  , stated that she was told that she would be sent another link because the first had become expired. Then called 2 mins later stating that she was not sent anything.  She was persistent in speaking to the provider and stated that she should have just come into the office . Pt also stated that she was wanting to speak to the provider in regards to her chemo, the appointment notes show that it was for a 3 month f/u . She then called multiple time requesting to speak to a Freight forwarder.  I spoke to the Provider who was then able to complete the appointment over the phone .  Her appointment then showed as completed and signed

## 2019-03-26 ENCOUNTER — Ambulatory Visit: Payer: Self-pay | Admitting: Surgery

## 2019-03-26 DIAGNOSIS — C50912 Malignant neoplasm of unspecified site of left female breast: Secondary | ICD-10-CM

## 2019-03-26 DIAGNOSIS — Z170 Estrogen receptor positive status [ER+]: Secondary | ICD-10-CM

## 2019-03-26 NOTE — H&P (Signed)
Erin Lawson Documented: 03/26/2019 9:09 AM Location: [facility] Patient #: [id number] DOB: Jan 12, 1961 Single / Language: Erin Lawson / Race: Black or African American Female  History of Present Illness Erin Lawson A. [name] MD; 03/26/2019 1:43 PM) Patient words: Patient treasure follow-up of her left breast cancer treated with neoadjuvant chemotherapy. She's had a good response by magnetic resonance imaging. She is ready to proceed with lumpectomy and sentinel lymph node mapping. She complains of left-sided swelling. She brought this to the attention of Dr. Lindi Adie and her primary care. Stasis complaining more left knee pain and swelling. The swelling has gone down on her left side. She denies weakness. Most of her pain is in her left knee made worse with movement. Denies history of trauma or gout   CLINICAL DATA: Biopsy-proven left breast cancer October 2020 2 sites in the mid to upper periareolar region post neoadjuvant chemotherapy. Chemotherapy completed last week.  LABS: None.  EXAM: BILATERAL BREAST MRI WITH AND WITHOUT CONTRAST  TECHNIQUE: Multiplanar, multisequence MR images of both breasts were obtained prior to and following the intravenous administration of 7 ml of Gadavist  Three-dimensional MR images were rendered by post-processing of the original MR data on an independent workstation. The three-dimensional MR images were interpreted, and findings are reported in the following complete MRI report for this study. Three dimensional images were evaluated at the independent DynaCad workstation  COMPARISON: Previous exam(s).  FINDINGS: Breast composition: c. Heterogeneous fibroglandular tissue.  Background parenchymal enhancement: Mild  Right breast: No mass or abnormal enhancement. Known benign skin lesion over the inferior central breast just below the nipple.  Left breast: Positive imaging response with near complete resolution of patient's  known biopsy proven malignancy over the anterior third of the mid to upper breast containing biopsy clip artifact. There is only subtle residual non mass enhancement in this region spanning 1.6 cm in greatest diameter. Post biopsy change with clip over the benign biopsy site over the anterior third of the upper-outer quadrant. Remainder of the left breast is unremarkable.  Lymph nodes: Clip artifact over the benign biopsy noted near the axillary tail of the left breast. No suspicious axillary lymph nodes bilaterally.  Ancillary findings: None.  IMPRESSION: 1. Positive imaging response to neoadjuvant chemotherapy with near complete resolution of biopsy-proven malignancy over the anterior third of the mid to upper left breast with only subtle residual 1.5 cm area of non mass enhancement.  2. No abnormal mass or enhancement within the right breast.  RECOMMENDATION: Recommend continued management per clinical treatment plan.  BI-RADS CATEGORY 6: Known biopsy-proven malignancy.   Electronically Signed By: Marin Olp M.D. On: 03/11/2019 16:49.  The patient is a 59 year old female.   Allergies (Erin Lawson, [place]; 03/26/2019 9:09 AM) Atenolol *BETA BLOCKERS* "Made me feel bad." Meloxicam *ANALGESICS - ANTI-INFLAMMATORY* Rash. Poractant Alfa *RESPIRATORY AGENTS - MISC.* Migraine Pork (Diagnostic) *DIAGNOSTIC PRODUCTS* Rash. Pork derived products Allergies Reconciled  Medication History (Erin Lawson, [place]; 03/26/2019 9:10 AM) amLODIPine Besylate (5MG  Tablet, Oral) Active. Medications Reconciled    Vitals (Erin A. Brown RMA; 03/26/2019 9:10 AM) 03/26/2019 9:10 AM Weight: 143.2 lb Height: 61in Body Surface Area: 1.64 m Body Mass Index: 27.06 kg/m  Temp.: 25F  Pulse: 89 (Regular)  BP: 134/84 (Sitting, Left Arm, Standard)        Physical Exam (Erin Lawson A. Dalaina Tates MD; 03/26/2019 1:42 PM)  General Mental Status-Alert. General  Appearance-Consistent with stated age. Hydration-Well hydrated. Voice-Normal.  Chest and Lung Exam Note: port upper right  chest.  Breast Breast - Left-Symmetric, Non Tender, No Biopsy scars, no Dimpling - Left, No Inflammation, No Lumpectomy scars, No Mastectomy scars, No Peau d' Orange. Breast - Right-Symmetric, Non Tender, No Biopsy scars, no Dimpling - Right, No Inflammation, No Lumpectomy scars, No Mastectomy scars, No Peau d' Orange. Breast Lump-No Palpable Breast Mass.  Cardiovascular Note: Normal sinus rhythm no JVD  Neurologic Neurologic evaluation reveals -alert and oriented x 3 with no impairment of recent or remote memory. Mental Status-Normal.  Musculoskeletal Note: Left knee swelling noted. Possible effusion left knee. Tender to touch but not red  Lymphatic Head & Neck  General Head & Neck Lymphatics: Bilateral - Description - Normal. Axillary  General Axillary Region: Bilateral - Description - Normal. Tenderness - Non Tender.    Assessment & Plan (Erin Lawson A. Lajuanda Penick MD; 03/26/2019 1:44 PM)  BREAST CANCER, LEFT (C50.912) Impression: neoadjuvant chemotherapy complete Patient is chosen left breast lumpectomy and left axillary sentinel lymph node mapping.  Risk of lumpectomy include bleeding, infection, seroma, more surgery, use of seed/wire, wound care, cosmetic deformity and the need for other treatments, death , blood clots, death. Pt agrees to proceed. Risk of sentinel lymph node mapping include bleeding, infection, lymphedema, shoulder pain. stiffness, dye allergy. cosmetic deformity , blood clots, death, need for more surgery. Pt agrees to proceed.  total time 30 minutes face-to-face, examination, review records, reviewing magnetic resonance imaging, discuss treatment options and document  Current Plans You are being scheduled for surgery- Our schedulers will call you.  You should hear from our office's scheduling department within 5  working days about the location, date, and time of surgery. We try to make accommodations for patient's preferences in scheduling surgery, but sometimes the OR schedule or the surgeon's schedule prevents [name] from making those accommodations.  If you have not heard from our office [phone]) in 5 working days, call the office and ask for your surgeon's nurse.  If you have other questions about your diagnosis, plan, or surgery, call the office and ask for your surgeon's nurse.  We discussed the staging and pathophysiology of breast cancer. We discussed all of the different options for treatment for breast cancer including surgery, chemotherapy, radiation therapy, Herceptin, and antiestrogen therapy. We discussed a sentinel lymph node biopsy as she does not appear to having lymph node involvement right now. We discussed the performance of that with injection of radioactive tracer and blue dye. We discussed that she would have an incision underneath her axillary hairline. We discussed that there is a bout a 10-20% chance of having a positive node with a sentinel lymph node biopsy and we will await the permanent pathology to make any other first further decisions in terms of her treatment. One of these options might be to return to the operating room to perform an axillary lymph node dissection. We discussed about a 1-2% risk lifetime of chronic shoulder pain as well as lymphedema associated with a sentinel lymph node biopsy. We discussed the options for treatment of the breast cancer which included lumpectomy versus a mastectomy. We discussed the performance of the lumpectomy with a wire placement. We discussed a 10-20% chance of a positive margin requiring reexcision in the operating room. We also discussed that she may need radiation therapy or antiestrogen therapy or both if she undergoes lumpectomy. We discussed the mastectomy and the postoperative care for that as well. We discussed that there is no  difference in her survival whether she undergoes lumpectomy with radiation therapy or antiestrogen  therapy versus a mastectomy. There is a slight difference in the local recurrence rate being 3-5% with lumpectomy and about 1% with a mastectomy. We discussed the risks of operation including bleeding, infection, possible reoperation. She understands her further therapy will be based on what her stages at the time of her operation.  Pt Education - [facility] breast cancer surgery: discussed with patient and provided information. Pt Education - ABC (After Breast Cancer) Class Info: discussed with patient and provided information. Pt Education - [facility] Breast Pains Education  LEFT KNEE PAIN (M25.562) Impression: swollen left knee without signs of infection Possible osteoarthritis  questionable effusion refer to orthopedics

## 2019-03-26 NOTE — H&P (View-Only) (Signed)
Erin Lawson Documented: 03/26/2019 9:09 AM Location: [facility] Patient #: [id number] DOB: 1960-11-16 Single / Language: Erin Lawson / Race: Black or African American Female  History of Present Illness [name] A. Melisssa Donner MD; 03/26/2019 1:43 PM) Patient words: Patient treasure follow-up of her left breast cancer treated with neoadjuvant chemotherapy. She's had a good response by magnetic resonance imaging. She is ready to proceed with lumpectomy and sentinel lymph node mapping. She complains of left-sided swelling. She brought this to the attention of Dr. Lindi Adie and her primary care. Stasis complaining more left knee pain and swelling. The swelling has gone down on her left side. She denies weakness. Most of her pain is in her left knee made worse with movement. Denies history of trauma or gout   CLINICAL DATA: Biopsy-proven left breast cancer October 2020 2 sites in the mid to upper periareolar region post neoadjuvant chemotherapy. Chemotherapy completed last week.  LABS: None.  EXAM: BILATERAL BREAST MRI WITH AND WITHOUT CONTRAST  TECHNIQUE: Multiplanar, multisequence MR images of both breasts were obtained prior to and following the intravenous administration of 7 ml of Gadavist  Three-dimensional MR images were rendered by post-processing of the original MR data on an independent workstation. The three-dimensional MR images were interpreted, and findings are reported in the following complete MRI report for this study. Three dimensional images were evaluated at the independent DynaCad workstation  COMPARISON: Previous exam(s).  FINDINGS: Breast composition: c. Heterogeneous fibroglandular tissue.  Background parenchymal enhancement: Mild  Right breast: No mass or abnormal enhancement. Known benign skin lesion over the inferior central breast just below the nipple.  Left breast: Positive imaging response with near complete resolution of patient's  known biopsy proven malignancy over the anterior third of the mid to upper breast containing biopsy clip artifact. There is only subtle residual non mass enhancement in this region spanning 1.6 cm in greatest diameter. Post biopsy change with clip over the benign biopsy site over the anterior third of the upper-outer quadrant. Remainder of the left breast is unremarkable.  Lymph nodes: Clip artifact over the benign biopsy noted near the axillary tail of the left breast. No suspicious axillary lymph nodes bilaterally.  Ancillary findings: None.  IMPRESSION: 1. Positive imaging response to neoadjuvant chemotherapy with near complete resolution of biopsy-proven malignancy over the anterior third of the mid to upper left breast with only subtle residual 1.5 cm area of non mass enhancement.  2. No abnormal mass or enhancement within the right breast.  RECOMMENDATION: Recommend continued management per clinical treatment plan.  BI-RADS CATEGORY 6: Known biopsy-proven malignancy.   Electronically Signed By: Marin Olp M.D. On: 03/11/2019 16:49.  The patient is a 59 year old female.   Allergies (Erin Lawson, [place]; 03/26/2019 9:09 AM) Atenolol *BETA BLOCKERS* "Made me feel bad." Meloxicam *ANALGESICS - ANTI-INFLAMMATORY* Rash. Poractant Alfa *RESPIRATORY AGENTS - MISC.* Migraine Pork (Diagnostic) *DIAGNOSTIC PRODUCTS* Rash. Pork derived products Allergies Reconciled  Medication History (Erin Lawson, [place]; 03/26/2019 9:10 AM) amLODIPine Besylate (5MG  Tablet, Oral) Active. Medications Reconciled    Vitals (Erin A. Brown RMA; 03/26/2019 9:10 AM) 03/26/2019 9:10 AM Weight: 143.2 lb Height: 61in Body Surface Area: 1.64 m Body Mass Index: 27.06 kg/m  Temp.: 91F  Pulse: 89 (Regular)  BP: 134/84 (Sitting, Left Arm, Standard)        Physical Exam (Shante Archambeault A. Safari Cinque MD; 03/26/2019 1:42 PM)  General Mental Status-Alert. General  Appearance-Consistent with stated age. Hydration-Well hydrated. Voice-Normal.  Chest and Lung Exam Note: port upper right  chest.  Breast Breast - Left-Symmetric, Non Tender, No Biopsy scars, no Dimpling - Left, No Inflammation, No Lumpectomy scars, No Mastectomy scars, No Peau d' Orange. Breast - Right-Symmetric, Non Tender, No Biopsy scars, no Dimpling - Right, No Inflammation, No Lumpectomy scars, No Mastectomy scars, No Peau d' Orange. Breast Lump-No Palpable Breast Mass.  Cardiovascular Note: Normal sinus rhythm no JVD  Neurologic Neurologic evaluation reveals -alert and oriented x 3 with no impairment of recent or remote memory. Mental Status-Normal.  Musculoskeletal Note: Left knee swelling noted. Possible effusion left knee. Tender to touch but not red  Lymphatic Head & Neck  General Head & Neck Lymphatics: Bilateral - Description - Normal. Axillary  General Axillary Region: Bilateral - Description - Normal. Tenderness - Non Tender.    Assessment & Plan (Erin Matty A. Maleiya Pergola MD; 03/26/2019 1:44 PM)  BREAST CANCER, LEFT (C50.912) Impression: neoadjuvant chemotherapy complete Patient is chosen left breast lumpectomy and left axillary sentinel lymph node mapping.  Risk of lumpectomy include bleeding, infection, seroma, more surgery, use of seed/wire, wound care, cosmetic deformity and the need for other treatments, death , blood clots, death. Pt agrees to proceed. Risk of sentinel lymph node mapping include bleeding, infection, lymphedema, shoulder pain. stiffness, dye allergy. cosmetic deformity , blood clots, death, need for more surgery. Pt agrees to proceed.  total time 30 minutes face-to-face, examination, review records, reviewing magnetic resonance imaging, discuss treatment options and document  Current Plans You are being scheduled for surgery- Our schedulers will call you.  You should hear from our office's scheduling department within 5  working days about the location, date, and time of surgery. We try to make accommodations for patient's preferences in scheduling surgery, but sometimes the OR schedule or the surgeon's schedule prevents [name] from making those accommodations.  If you have not heard from our office [phone]) in 5 working days, call the office and ask for your surgeon's nurse.  If you have other questions about your diagnosis, plan, or surgery, call the office and ask for your surgeon's nurse.  We discussed the staging and pathophysiology of breast cancer. We discussed all of the different options for treatment for breast cancer including surgery, chemotherapy, radiation therapy, Herceptin, and antiestrogen therapy. We discussed a sentinel lymph node biopsy as she does not appear to having lymph node involvement right now. We discussed the performance of that with injection of radioactive tracer and blue dye. We discussed that she would have an incision underneath her axillary hairline. We discussed that there is a bout a 10-20% chance of having a positive node with a sentinel lymph node biopsy and we will await the permanent pathology to make any other first further decisions in terms of her treatment. One of these options might be to return to the operating room to perform an axillary lymph node dissection. We discussed about a 1-2% risk lifetime of chronic shoulder pain as well as lymphedema associated with a sentinel lymph node biopsy. We discussed the options for treatment of the breast cancer which included lumpectomy versus a mastectomy. We discussed the performance of the lumpectomy with a wire placement. We discussed a 10-20% chance of a positive margin requiring reexcision in the operating room. We also discussed that she may need radiation therapy or antiestrogen therapy or both if she undergoes lumpectomy. We discussed the mastectomy and the postoperative care for that as well. We discussed that there is no  difference in her survival whether she undergoes lumpectomy with radiation therapy or antiestrogen  therapy versus a mastectomy. There is a slight difference in the local recurrence rate being 3-5% with lumpectomy and about 1% with a mastectomy. We discussed the risks of operation including bleeding, infection, possible reoperation. She understands her further therapy will be based on what her stages at the time of her operation.  Pt Education - [facility]b breast cancer surgery: discussed with patient and provided information. Pt Education - ABC (After Breast Cancer) Class Info: discussed with patient and provided information. Pt Education - [facility] Breast Pains Education  LEFT KNEE PAIN (M25.562) Impression: swollen left knee without signs of infection Possible osteoarthritis  questionable effusion refer to orthopedics

## 2019-03-29 ENCOUNTER — Other Ambulatory Visit: Payer: Self-pay | Admitting: Surgery

## 2019-03-29 ENCOUNTER — Encounter: Payer: Self-pay | Admitting: *Deleted

## 2019-03-29 DIAGNOSIS — C50912 Malignant neoplasm of unspecified site of left female breast: Secondary | ICD-10-CM

## 2019-03-29 DIAGNOSIS — C50212 Malignant neoplasm of upper-inner quadrant of left female breast: Secondary | ICD-10-CM

## 2019-03-29 DIAGNOSIS — Z171 Estrogen receptor negative status [ER-]: Secondary | ICD-10-CM

## 2019-03-30 ENCOUNTER — Telehealth: Payer: Self-pay | Admitting: Hematology and Oncology

## 2019-03-30 NOTE — Telephone Encounter (Signed)
R/s appt per 3/15 sch message - pt aware of appts changed

## 2019-03-31 ENCOUNTER — Other Ambulatory Visit: Payer: Self-pay | Admitting: *Deleted

## 2019-03-31 ENCOUNTER — Encounter: Payer: Self-pay | Admitting: *Deleted

## 2019-03-31 DIAGNOSIS — C50212 Malignant neoplasm of upper-inner quadrant of left female breast: Secondary | ICD-10-CM

## 2019-03-31 NOTE — Progress Notes (Signed)
ambu

## 2019-04-02 ENCOUNTER — Inpatient Hospital Stay: Payer: 59 | Attending: Hematology and Oncology

## 2019-04-02 ENCOUNTER — Other Ambulatory Visit: Payer: Self-pay | Admitting: Hematology and Oncology

## 2019-04-02 ENCOUNTER — Other Ambulatory Visit: Payer: Self-pay

## 2019-04-02 VITALS — BP 138/56 | HR 72 | Temp 98.1°F | Resp 18

## 2019-04-02 DIAGNOSIS — C50212 Malignant neoplasm of upper-inner quadrant of left female breast: Secondary | ICD-10-CM | POA: Diagnosis present

## 2019-04-02 DIAGNOSIS — Z171 Estrogen receptor negative status [ER-]: Secondary | ICD-10-CM

## 2019-04-02 DIAGNOSIS — Z5112 Encounter for antineoplastic immunotherapy: Secondary | ICD-10-CM | POA: Diagnosis present

## 2019-04-02 MED ORDER — TRASTUZUMAB-DKST CHEMO 150 MG IV SOLR
399.0000 mg | Freq: Once | INTRAVENOUS | Status: AC
  Administered 2019-04-02: 399 mg via INTRAVENOUS
  Filled 2019-04-02: qty 19

## 2019-04-02 MED ORDER — SODIUM CHLORIDE 0.9 % IV SOLN
Freq: Once | INTRAVENOUS | Status: AC
  Filled 2019-04-02: qty 250

## 2019-04-02 MED ORDER — SODIUM CHLORIDE 0.9% FLUSH
10.0000 mL | INTRAVENOUS | Status: DC | PRN
  Administered 2019-04-02: 10 mL
  Filled 2019-04-02: qty 10

## 2019-04-02 MED ORDER — ACETAMINOPHEN 325 MG PO TABS
650.0000 mg | ORAL_TABLET | Freq: Once | ORAL | Status: AC
  Administered 2019-04-02: 650 mg via ORAL

## 2019-04-02 MED ORDER — SODIUM CHLORIDE 0.9 % IV SOLN
420.0000 mg | Freq: Once | INTRAVENOUS | Status: AC
  Administered 2019-04-02: 420 mg via INTRAVENOUS
  Filled 2019-04-02: qty 14

## 2019-04-02 MED ORDER — DIPHENHYDRAMINE HCL 25 MG PO CAPS
ORAL_CAPSULE | ORAL | Status: AC
  Filled 2019-04-02: qty 2

## 2019-04-02 MED ORDER — HEPARIN SOD (PORK) LOCK FLUSH 100 UNIT/ML IV SOLN
500.0000 [IU] | Freq: Once | INTRAVENOUS | Status: AC | PRN
  Administered 2019-04-02: 500 [IU]
  Filled 2019-04-02: qty 5

## 2019-04-02 MED ORDER — DIPHENHYDRAMINE HCL 25 MG PO CAPS
25.0000 mg | ORAL_CAPSULE | Freq: Once | ORAL | Status: AC
  Administered 2019-04-02: 25 mg via ORAL

## 2019-04-02 MED ORDER — ACETAMINOPHEN 325 MG PO TABS
ORAL_TABLET | ORAL | Status: AC
  Filled 2019-04-02: qty 2

## 2019-04-02 NOTE — Progress Notes (Signed)
Okay to run pertuzumab over 30 minutes today as patient has previously received this medication and it is not a loading dose.   Demetrius Charity, PharmD, BCPS, [facility] Pharmacist Pharmacy Phone: [phone] 04/02/2019

## 2019-04-02 NOTE — Progress Notes (Signed)
Okay to proceed with Herceptin/Perjeta today with echocardiogram from 10/2018.    RN set up for echocardiogram for 3/23 @ 11am - Infusion nurse is aware, and will provide education to patient.

## 2019-04-02 NOTE — Patient Instructions (Signed)
[facility] Discharge Instructions for Patients Receiving Chemotherapy  Today you received the following chemotherapy agents: trastuzumab and pertuzumab.  To help prevent nausea and vomiting after your treatment, we encourage you to take your nausea medication as directed.   If you develop nausea and vomiting that is not controlled by your nausea medication, call the clinic.   BELOW ARE SYMPTOMS THAT SHOULD BE REPORTED IMMEDIATELY:  *FEVER GREATER THAN 100.5 F  *CHILLS WITH OR WITHOUT FEVER  NAUSEA AND VOMITING THAT IS NOT CONTROLLED WITH YOUR NAUSEA MEDICATION  *UNUSUAL SHORTNESS OF BREATH  *UNUSUAL BRUISING OR BLEEDING  TENDERNESS IN MOUTH AND THROAT WITH OR WITHOUT PRESENCE OF ULCERS  *URINARY PROBLEMS  *BOWEL PROBLEMS  UNUSUAL RASH Items with * indicate a potential emergency and should be followed up as soon as possible.  Feel free to call the clinic should you have any questions or concerns. The clinic phone number is [phone].  Please show the CHEMO ALERT CARD at check-in to the Emergency Department and triage nurse.   

## 2019-04-06 ENCOUNTER — Ambulatory Visit (HOSPITAL_COMMUNITY)
Admission: RE | Admit: 2019-04-06 | Discharge: 2019-04-06 | Disposition: A | Discharge status: Home / Self Care | Payer: 59 | Source: Ambulatory Visit | Attending: Hematology and Oncology | Admitting: Hematology and Oncology

## 2019-04-06 ENCOUNTER — Other Ambulatory Visit: Payer: Self-pay

## 2019-04-06 DIAGNOSIS — C50212 Malignant neoplasm of upper-inner quadrant of left female breast: Secondary | ICD-10-CM | POA: Diagnosis not present

## 2019-04-06 DIAGNOSIS — E119 Type 2 diabetes mellitus without complications: Secondary | ICD-10-CM | POA: Insufficient documentation

## 2019-04-06 DIAGNOSIS — I119 Hypertensive heart disease without heart failure: Secondary | ICD-10-CM | POA: Diagnosis not present

## 2019-04-06 DIAGNOSIS — Z79899 Other long term (current) drug therapy: Secondary | ICD-10-CM | POA: Diagnosis not present

## 2019-04-06 DIAGNOSIS — Z5181 Encounter for therapeutic drug level monitoring: Secondary | ICD-10-CM | POA: Insufficient documentation

## 2019-04-06 DIAGNOSIS — C50919 Malignant neoplasm of unspecified site of unspecified female breast: Secondary | ICD-10-CM | POA: Insufficient documentation

## 2019-04-06 DIAGNOSIS — Z171 Estrogen receptor negative status [ER-]: Secondary | ICD-10-CM

## 2019-04-06 NOTE — Progress Notes (Signed)
Echocardiogram 2D Echocardiogram has been performed.  Oneal Deputy Lorah Kalina 04/06/2019, 11:32 AM

## 2019-04-07 ENCOUNTER — Ambulatory Visit: Payer: 59 | Admitting: Orthopaedic Surgery

## 2019-04-07 ENCOUNTER — Ambulatory Visit: Payer: Self-pay

## 2019-04-07 ENCOUNTER — Encounter: Payer: Self-pay | Admitting: Orthopaedic Surgery

## 2019-04-07 DIAGNOSIS — M25562 Pain in left knee: Secondary | ICD-10-CM

## 2019-04-07 MED ORDER — LIDOCAINE HCL 1 % IJ SOLN
3.0000 mL | INTRAMUSCULAR | Status: AC | PRN
  Administered 2019-04-07: 3 mL

## 2019-04-07 MED ORDER — METHYLPREDNISOLONE ACETATE 40 MG/ML IJ SUSP
40.0000 mg | INTRAMUSCULAR | Status: AC | PRN
  Administered 2019-04-07: 40 mg via INTRA_ARTICULAR

## 2019-04-07 NOTE — Progress Notes (Signed)
Office Visit Note   Patient: Erin Lawson           Date of Birth: 31-Aug-1960           MRN: [id number] Visit Date: 04/07/2019              Requested by: Forrest Moron, MD [place],  [place] [id number] PCP: Forrest Moron, MD   Assessment & Plan: Visit Diagnoses:  1. Acute pain of left knee     Plan:  We will see her back in 6 weeks to see how she does status post aspiration injection of the left knee which was performed by Dr. Ninfa Linden today.  Plan and she will use Voltaren gel on the knee up to 4 times daily.  Questions were encouraged and answered by Dr. Ninfa Linden and myself.  Follow-Up Instructions: Return in about 6 weeks (around 05/19/2019).   Orders:  Orders Placed This Encounter  Procedures  . Large Joint Inj  . XR Knee 1-2 Views Left   No orders of the defined types were placed in this encounter.     Procedures: Large Joint Inj: L knee on 04/07/2019 11:21 AM Indications: pain Details: 22 G 1.5 in needle, superolateral approach  Arthrogram: No  Medications: 3 mL lidocaine 1 %; 40 mg methylPREDNISolone acetate 40 MG/ML Aspirate: 5 mL yellow Outcome: tolerated well, no immediate complications Procedure, treatment alternatives, risks and benefits explained, specific risks discussed. Consent was given by the patient. Immediately prior to procedure a time out was called to verify the correct patient, procedure, equipment, support staff and site/side marked as required. Patient was prepped and draped in the usual sterile fashion.       Clinical Data: No additional findings.   Subjective: Chief Complaint  Patient presents with  . Left Knee - Pain    HPI Erin Lawson is 59 year old female who is referred by Dr. Brantley Stage for left knee pain and swelling for a month or more.  She notes that week of February 14 she had some swelling of her left lower leg.  No known injury.  She thought this was due to eating some shrimp.  She also reports  eating some shrimp after the initial swelling in her left knee and that her whole left side including her arm and chest had some swelling.  She felt like she was "swimming in water".  States the swelling has somewhat resolved except for left knee.  Still having swelling pain left knee.  Some giving way sensation but no other mechanical symptoms of the left knee.  She feels like she has to drag her left leg at times. She does have a Port-A-Cath is being treated for breast cancer which has had good rate response per her last visit with Dr. Brantley Stage.  Review of Systems See HPI  Objective: Vital Signs: There were no vitals taken for this visit.  Physical Exam Constitutional:      Appearance: She is not ill-appearing or diaphoretic.  Pulmonary:     Effort: Pulmonary effort is normal.  Neurological:     Mental Status: She is alert and oriented to person, place, and time.  Psychiatric:        Mood and Affect: Mood normal.     Ortho Exam Bilateral knees good range of motion pain with forced flexion of the left knee.  Positive effusion left knee.  No abnormal warmth erythema either knee.  No instability with valgus varus stressing of either knee.  McMurray's positive on the left negative on the right.  Bilateral calf supple nontender.  Posterior tibial pulses and dorsal pedal pulses are 2+ and equal and symmetric.  Specialty Comments:  No specialty comments available.  Imaging: ECHOCARDIOGRAM COMPLETE  Result Date: 04/06/2019    ECHOCARDIOGRAM REPORT   Patient Name:   Erin Lawson Date of Exam: 04/06/2019 Medical Rec #:  [id number]             Height:       61.0 in Accession #:    [id number]            Weight:       140.1 lb Date of Birth:  1960/09/08             BSA:          1.624 m Patient Age:    13 years              BP:           167/75 mmHg Patient Gender: F                     HR:           63 bpm. Exam Location:  Outpatient Procedure: 2D Echo, 3D Echo, Color Doppler, Cardiac  Doppler and Strain Analysis Indications:    Chemo Evaluation z09  History:        Patient has prior history of Echocardiogram examinations, most                 recent 11/05/2018. Risk Factors:Hypertension and Diabetes.                 Breast Cancer.  Sonographer:    Raquel Sarna Senior RDCS Referring Phys: [id number] Erin Lawson IMPRESSIONS  1. Left ventricular ejection fraction, by estimation, is 60 to 65%. The left ventricle has normal function. The left ventricle has no regional wall motion abnormalities. Left ventricular diastolic parameters are consistent with Grade I diastolic dysfunction (impaired relaxation). The average left ventricular global longitudinal strain is -21.2 %. RV S' 14.3 cm/s.  2. Right ventricular systolic function is normal. The right ventricular size is normal. There is normal pulmonary artery systolic pressure.  3. Left atrial size was mildly dilated.  4. The mitral valve is normal in structure. No evidence of mitral valve regurgitation. No evidence of mitral stenosis.  5. The aortic valve is normal in structure. Aortic valve regurgitation is not visualized. No aortic stenosis is present.  6. The inferior vena cava is normal in size with greater than 50% respiratory variability, suggesting right atrial pressure of 3 mmHg. Comparison(s): No significant change from prior study. The left ventricular function is unchanged. FINDINGS  Left Ventricle: RV S' 14.3 cm/s. Left ventricular ejection fraction, by estimation, is 60 to 65%. The left ventricle has normal function. The left ventricle has no regional wall motion abnormalities. The average left ventricular global longitudinal strain is -21.2 %. The left ventricular internal cavity size was normal in size. There is no left ventricular hypertrophy. Left ventricular diastolic parameters are consistent with Grade I diastolic dysfunction (impaired relaxation). Right Ventricle: The right ventricular size is normal. No increase in right ventricular wall  thickness. Right ventricular systolic function is normal. There is normal pulmonary artery systolic pressure. The tricuspid regurgitant velocity is 2.27 m/s, and  with an assumed right atrial pressure of 3 mmHg, the estimated right ventricular systolic pressure is 123456 mmHg. Left Atrium: Left atrial size was  mildly dilated. Right Atrium: Right atrial size was normal in size. Pericardium: There is no evidence of pericardial effusion. Mitral Valve: The mitral valve is normal in structure. Normal mobility of the mitral valve leaflets. No evidence of mitral valve regurgitation. No evidence of mitral valve stenosis. Tricuspid Valve: The tricuspid valve is normal in structure. Tricuspid valve regurgitation is trivial. No evidence of tricuspid stenosis. Aortic Valve: The aortic valve is normal in structure. Aortic valve regurgitation is not visualized. No aortic stenosis is present. Pulmonic Valve: The pulmonic valve was normal in structure. Pulmonic valve regurgitation is trivial. No evidence of pulmonic stenosis. Aorta: The aortic root is normal in size and structure. Venous: The inferior vena cava is normal in size with greater than 50% respiratory variability, suggesting right atrial pressure of 3 mmHg. IAS/Shunts: No atrial level shunt detected by color flow Doppler.  LEFT VENTRICLE PLAX 2D LVIDd:         3.70 cm  Diastology LVIDs:         2.20 cm  LV e' lateral:   8.81 cm/s LV PW:         0.90 cm  LV E/e' lateral: 10.9 LV IVS:        1.00 cm  LV e' medial:    8.70 cm/s LVOT diam:     1.60 cm  LV E/e' medial:  11.1 LV SV:         43 LV SV Index:   27       2D Longitudinal Strain LVOT Area:     2.01 cm 2D Strain GLS Avg:     -21.2 %                          3D Volume EF:                         3D EF:        63 %                         LV EDV:       101 ml                         LV ESV:       38 ml                         LV SV:        63 ml RIGHT VENTRICLE RV S prime:     14.30 cm/s TAPSE (M-mode): 2.7 cm LEFT  ATRIUM             Index       RIGHT ATRIUM           Index LA diam:        3.60 cm 2.22 cm/m  RA Area:     12.60 cm LA Vol (A2C):   48.7 ml 30.00 ml/m RA Volume:   24.10 ml  14.84 ml/m LA Vol (A4C):   61.1 ml 37.63 ml/m LA Biplane Vol: 58.3 ml 35.91 ml/m  AORTIC VALVE LVOT Vmax:   88.10 cm/s LVOT Vmean:  61.500 cm/s LVOT VTI:    0.215 m  AORTA Ao Root diam: 2.70 cm Ao Asc diam:  3.20 cm MITRAL VALVE  TRICUSPID VALVE MV Area (PHT): 3.37 cm     TR Peak grad:   20.6 mmHg MV Decel Time: 225 msec     TR Vmax:        227.00 cm/s MV E velocity: 96.40 cm/s MV A velocity: 102.00 cm/s  SHUNTS MV E/A ratio:  0.95         Systemic VTI:  0.22 m                             Systemic Diam: 1.60 cm Candee Furbish MD Electronically signed by Candee Furbish MD Signature Date/Time: 04/06/2019/1:28:40 PM    Final    XR Knee 1-2 Views Left  Result Date: 04/07/2019 Left knee AP/ lateral views: No acute fracture.  Some mild narrowing of the lateral compartments are otherwise well-preserved.  Knee is well located.    PMFS History: Patient Active Problem List   Diagnosis Date Noted  . Port-A-Cath in place 12/23/2018  . Malignant neoplasm of upper-inner quadrant of left breast in female, estrogen receptor negative ([place]) 10/26/2018  . Vitamin D deficiency 12/12/2017  . Primary osteoarthritis of left knee 12/12/2017  . Chest pain 05/18/2012  . Mild anemia 05/18/2012  . DM type 2 (diabetes mellitus, type 2) ([place]) 05/18/2012  . Essential hypertension 04/06/2007  . KELOID 04/06/2007   Past Medical History:  Diagnosis Date  . Anemia   . Anxiety   . Cancer ([place]) 10/2018   left breast IDC  . DM type 2 (diabetes mellitus, type 2) ([place]) 05/18/2012  . GERD (gastroesophageal reflux disease)    OTC prn  . Hypertension     Family History  Problem Relation Age of Onset  . Hypertension Sister   . Heart attack Neg Hx     Past Surgical History:  Procedure Laterality Date  . CESAREAN SECTION    . PORTACATH  PLACEMENT Right 11/11/2018   Procedure: INSERTION PORT-A-CATH WITH ULTRASOUND;  Surgeon: Erroll Luna, MD;  Location: [facility];  Service: General;  Laterality: Right;  . utreine polyp     resected   Social History   Occupational History  . Occupation: cna    Comment: [employer]  Tobacco Use  . Smoking status: Never Smoker  . Smokeless tobacco: Never Used  Substance and Sexual Activity  . Alcohol use: No  . Drug use: No  . Sexual activity: Not on file

## 2019-04-12 ENCOUNTER — Encounter (HOSPITAL_BASED_OUTPATIENT_CLINIC_OR_DEPARTMENT_OTHER): Payer: Self-pay | Admitting: Surgery

## 2019-04-12 ENCOUNTER — Other Ambulatory Visit: Payer: Self-pay

## 2019-04-12 ENCOUNTER — Telehealth: Payer: Self-pay | Admitting: Family Medicine

## 2019-04-15 ENCOUNTER — Other Ambulatory Visit: Payer: Self-pay

## 2019-04-15 ENCOUNTER — Encounter (HOSPITAL_BASED_OUTPATIENT_CLINIC_OR_DEPARTMENT_OTHER)
Admission: RE | Admit: 2019-04-15 | Discharge: 2019-04-15 | Disposition: A | Discharge status: Home / Self Care | Payer: 59 | Source: Ambulatory Visit | Attending: Surgery | Admitting: Surgery

## 2019-04-15 DIAGNOSIS — Z01810 Encounter for preprocedural cardiovascular examination: Secondary | ICD-10-CM | POA: Diagnosis not present

## 2019-04-15 LAB — CBC WITH DIFFERENTIAL/PLATELET
Abs Immature Granulocytes: 0.05 10*3/uL (ref 0.00–0.07)
Basophils Absolute: 0 10*3/uL (ref 0.0–0.1)
Basophils Relative: 0 %
Eosinophils Absolute: 0.1 10*3/uL (ref 0.0–0.5)
Eosinophils Relative: 1 %
HCT: 31.5 % — ABNORMAL LOW (ref 36.0–46.0)
Hemoglobin: 9.8 g/dL — ABNORMAL LOW (ref 12.0–15.0)
Immature Granulocytes: 1 %
Lymphocytes Relative: 44 %
Lymphs Abs: 3.1 10*3/uL (ref 0.7–4.0)
MCH: 25.1 pg — ABNORMAL LOW (ref 26.0–34.0)
MCHC: 31.1 g/dL (ref 30.0–36.0)
MCV: 80.8 fL (ref 80.0–100.0)
Monocytes Absolute: 0.6 10*3/uL (ref 0.1–1.0)
Monocytes Relative: 8 %
Neutro Abs: 3.2 10*3/uL (ref 1.7–7.7)
Neutrophils Relative %: 46 %
Platelets: 275 10*3/uL (ref 150–400)
RBC: 3.9 MIL/uL (ref 3.87–5.11)
RDW: 15.1 % (ref 11.5–15.5)
WBC: 7.1 10*3/uL (ref 4.0–10.5)
nRBC: 0 % (ref 0.0–0.2)

## 2019-04-15 LAB — COMPREHENSIVE METABOLIC PANEL
ALT: 15 U/L (ref 0–44)
AST: 16 U/L (ref 15–41)
Albumin: 3.6 g/dL (ref 3.5–5.0)
Alkaline Phosphatase: 121 U/L (ref 38–126)
Anion gap: 10 (ref 5–15)
BUN: 15 mg/dL (ref 6–20)
CO2: 23 mmol/L (ref 22–32)
Calcium: 9.2 mg/dL (ref 8.9–10.3)
Chloride: 106 mmol/L (ref 98–111)
Creatinine, Ser: 0.64 mg/dL (ref 0.44–1.00)
GFR calc Af Amer: >60 mL/min (ref >60)
GFR calc non Af Amer: >60 mL/min (ref >60)
Glucose, Bld: 166 mg/dL — ABNORMAL HIGH (ref 70–99)
Potassium: 3.9 mmol/L (ref 3.5–5.1)
Sodium: 139 mmol/L (ref 135–145)
Total Bilirubin: 0.6 mg/dL (ref 0.3–1.2)
Total Protein: 6.3 g/dL — ABNORMAL LOW (ref 6.5–8.1)

## 2019-04-15 NOTE — Progress Notes (Signed)
      Enhanced Recovery after Surgery for Orthopedics Enhanced Recovery after Surgery is a protocol used to improve the stress on your body and your recovery after surgery.  Patient Instructions  . The night before surgery:  o No food after midnight. ONLY clear liquids after midnight  . The day of surgery (if you do NOT have diabetes):  o Drink ONE (1) Pre-Surgery Clear Ensure as directed.   o This drink was given to you during your hospital  pre-op appointment visit. o The pre-op nurse will instruct you on the time to drink the  Pre-Surgery Ensure depending on your surgery time. o Finish the drink at the designated time by the pre-op nurse.  o Nothing else to drink after completing the  Pre-Surgery Clear Ensure.  . The day of surgery (if you have diabetes): o Drink ONE (1) Gatorade 2 (G2) as directed. o This drink was given to you during your hospital  pre-op appointment visit.  o The pre-op nurse will instruct you on the time to drink the   Gatorade 2 (G2) depending on your surgery time. o Color of the Gatorade may vary. Red is not allowed. o Nothing else to drink after completing the  Gatorade 2 (G2).         If you have questions, please contact your surgeon's office.   Surgical soap also given to patient with instructions for use.  Patient verbalized understanding. 

## 2019-04-17 ENCOUNTER — Other Ambulatory Visit (HOSPITAL_COMMUNITY)
Admission: RE | Admit: 2019-04-17 | Discharge: 2019-04-17 | Disposition: A | Discharge status: Home / Self Care | Payer: 59 | Source: Ambulatory Visit | Attending: Surgery | Admitting: Surgery

## 2019-04-17 DIAGNOSIS — Z20822 Contact with and (suspected) exposure to covid-19: Secondary | ICD-10-CM | POA: Insufficient documentation

## 2019-04-17 DIAGNOSIS — Z01812 Encounter for preprocedural laboratory examination: Secondary | ICD-10-CM | POA: Diagnosis present

## 2019-04-17 LAB — SARS CORONAVIRUS 2 (TAT 6-24 HRS): SARS Coronavirus 2: NEGATIVE

## 2019-04-19 NOTE — Progress Notes (Signed)
Hemoglobin-9.8, Dr. Sabra Heck aware, will proceed with surgery as scheduled.

## 2019-04-20 ENCOUNTER — Other Ambulatory Visit: Payer: Self-pay

## 2019-04-20 ENCOUNTER — Other Ambulatory Visit: Payer: Self-pay | Admitting: Surgery

## 2019-04-20 ENCOUNTER — Ambulatory Visit
Admission: RE | Admit: 2019-04-20 | Discharge: 2019-04-20 | Disposition: A | Discharge status: Home / Self Care | Payer: 59 | Source: Ambulatory Visit | Attending: Surgery | Admitting: Surgery

## 2019-04-20 DIAGNOSIS — Z170 Estrogen receptor positive status [ER+]: Secondary | ICD-10-CM

## 2019-04-20 DIAGNOSIS — C50912 Malignant neoplasm of unspecified site of left female breast: Secondary | ICD-10-CM

## 2019-04-21 ENCOUNTER — Ambulatory Visit (HOSPITAL_COMMUNITY)
Admission: RE | Admit: 2019-04-21 | Discharge: 2019-04-21 | Disposition: A | Discharge status: Home / Self Care | Payer: 59 | Source: Ambulatory Visit | Attending: Surgery | Admitting: Surgery

## 2019-04-21 ENCOUNTER — Ambulatory Visit (HOSPITAL_BASED_OUTPATIENT_CLINIC_OR_DEPARTMENT_OTHER): Payer: 59 | Admitting: Anesthesiology

## 2019-04-21 ENCOUNTER — Ambulatory Visit (HOSPITAL_BASED_OUTPATIENT_CLINIC_OR_DEPARTMENT_OTHER)
Admission: RE | Admit: 2019-04-21 | Discharge: 2019-04-21 | Disposition: A | Discharge status: Home / Self Care | Payer: 59 | Attending: Surgery | Admitting: Surgery

## 2019-04-21 ENCOUNTER — Ambulatory Visit: Admit: 2019-04-21 | Payer: 59

## 2019-04-21 ENCOUNTER — Encounter (HOSPITAL_BASED_OUTPATIENT_CLINIC_OR_DEPARTMENT_OTHER)
Admission: RE | Disposition: A | Discharge status: Home / Self Care | Payer: Self-pay | Source: Home / Self Care | Attending: Surgery

## 2019-04-21 ENCOUNTER — Ambulatory Visit
Admission: RE | Admit: 2019-04-21 | Discharge: 2019-04-21 | Disposition: A | Discharge status: Home / Self Care | Payer: 59 | Source: Ambulatory Visit | Attending: Surgery | Admitting: Surgery

## 2019-04-21 ENCOUNTER — Other Ambulatory Visit: Payer: Self-pay

## 2019-04-21 ENCOUNTER — Encounter (HOSPITAL_BASED_OUTPATIENT_CLINIC_OR_DEPARTMENT_OTHER): Payer: Self-pay | Admitting: Surgery

## 2019-04-21 DIAGNOSIS — Z7984 Long term (current) use of oral hypoglycemic drugs: Secondary | ICD-10-CM | POA: Insufficient documentation

## 2019-04-21 DIAGNOSIS — C50412 Malignant neoplasm of upper-outer quadrant of left female breast: Secondary | ICD-10-CM | POA: Insufficient documentation

## 2019-04-21 DIAGNOSIS — C50912 Malignant neoplasm of unspecified site of left female breast: Secondary | ICD-10-CM

## 2019-04-21 DIAGNOSIS — M1990 Unspecified osteoarthritis, unspecified site: Secondary | ICD-10-CM | POA: Diagnosis not present

## 2019-04-21 DIAGNOSIS — D649 Anemia, unspecified: Secondary | ICD-10-CM | POA: Insufficient documentation

## 2019-04-21 DIAGNOSIS — Z170 Estrogen receptor positive status [ER+]: Secondary | ICD-10-CM

## 2019-04-21 DIAGNOSIS — E118 Type 2 diabetes mellitus with unspecified complications: Secondary | ICD-10-CM | POA: Insufficient documentation

## 2019-04-21 DIAGNOSIS — Z91018 Allergy to other foods: Secondary | ICD-10-CM | POA: Insufficient documentation

## 2019-04-21 DIAGNOSIS — Z888 Allergy status to other drugs, medicaments and biological substances status: Secondary | ICD-10-CM | POA: Diagnosis not present

## 2019-04-21 DIAGNOSIS — I10 Essential (primary) hypertension: Secondary | ICD-10-CM | POA: Insufficient documentation

## 2019-04-21 DIAGNOSIS — K219 Gastro-esophageal reflux disease without esophagitis: Secondary | ICD-10-CM | POA: Insufficient documentation

## 2019-04-21 DIAGNOSIS — Z171 Estrogen receptor negative status [ER-]: Secondary | ICD-10-CM | POA: Diagnosis not present

## 2019-04-21 DIAGNOSIS — F419 Anxiety disorder, unspecified: Secondary | ICD-10-CM | POA: Insufficient documentation

## 2019-04-21 HISTORY — PX: BREAST LUMPECTOMY WITH RADIOACTIVE SEED AND SENTINEL LYMPH NODE BIOPSY: SHX6550

## 2019-04-21 LAB — GLUCOSE, CAPILLARY: Glucose-Capillary: 149 mg/dL — ABNORMAL HIGH (ref 70–99)

## 2019-04-21 SURGERY — BREAST LUMPECTOMY WITH RADIOACTIVE SEED AND SENTINEL LYMPH NODE BIOPSY
Anesthesia: General | Site: Breast | Laterality: Left

## 2019-04-21 MED ORDER — FENTANYL CITRATE (PF) 100 MCG/2ML IJ SOLN
INTRAMUSCULAR | Status: AC
  Filled 2019-04-21: qty 2

## 2019-04-21 MED ORDER — PHENYLEPHRINE 40 MCG/ML (10ML) SYRINGE FOR IV PUSH (FOR BLOOD PRESSURE SUPPORT)
PREFILLED_SYRINGE | INTRAVENOUS | Status: AC
  Filled 2019-04-21: qty 10

## 2019-04-21 MED ORDER — METHYLENE BLUE 0.5 % INJ SOLN
INTRAVENOUS | Status: AC
  Filled 2019-04-21: qty 10

## 2019-04-21 MED ORDER — CEFAZOLIN SODIUM-DEXTROSE 2-4 GM/100ML-% IV SOLN
2.0000 g | INTRAVENOUS | Status: AC
  Administered 2019-04-21: 2 g via INTRAVENOUS

## 2019-04-21 MED ORDER — LIDOCAINE 2% (20 MG/ML) 5 ML SYRINGE
INTRAMUSCULAR | Status: AC
  Filled 2019-04-21: qty 5

## 2019-04-21 MED ORDER — MIDAZOLAM HCL 2 MG/2ML IJ SOLN
1.0000 mg | INTRAMUSCULAR | Status: DC | PRN
  Administered 2019-04-21: 2 mg via INTRAVENOUS

## 2019-04-21 MED ORDER — DEXAMETHASONE SODIUM PHOSPHATE 10 MG/ML IJ SOLN
INTRAMUSCULAR | Status: AC
  Filled 2019-04-21: qty 1

## 2019-04-21 MED ORDER — BUPIVACAINE-EPINEPHRINE (PF) 0.25% -1:200000 IJ SOLN: INTRAMUSCULAR | Status: DC | PRN

## 2019-04-21 MED ORDER — GABAPENTIN 300 MG PO CAPS
300.0000 mg | ORAL_CAPSULE | ORAL | Status: AC
  Administered 2019-04-21: 08:00:00 300 mg via ORAL

## 2019-04-21 MED ORDER — ONDANSETRON HCL 4 MG/2ML IJ SOLN
INTRAMUSCULAR | Status: AC
  Filled 2019-04-21: qty 2

## 2019-04-21 MED ORDER — PHENYLEPHRINE 40 MCG/ML (10ML) SYRINGE FOR IV PUSH (FOR BLOOD PRESSURE SUPPORT)
PREFILLED_SYRINGE | INTRAVENOUS | Status: DC | PRN
  Administered 2019-04-21: 80 ug via INTRAVENOUS
  Administered 2019-04-21: 120 ug via INTRAVENOUS
  Administered 2019-04-21: 80 ug via INTRAVENOUS
  Administered 2019-04-21: 120 ug via INTRAVENOUS

## 2019-04-21 MED ORDER — FENTANYL CITRATE (PF) 100 MCG/2ML IJ SOLN: 25.0000 ug | INTRAMUSCULAR | Status: DC | PRN

## 2019-04-21 MED ORDER — CEFAZOLIN SODIUM-DEXTROSE 2-4 GM/100ML-% IV SOLN
INTRAVENOUS | Status: AC
  Filled 2019-04-21: qty 100

## 2019-04-21 MED ORDER — 0.9 % SODIUM CHLORIDE (POUR BTL) OPTIME
TOPICAL | Status: DC | PRN
  Administered 2019-04-21: 600 mL

## 2019-04-21 MED ORDER — EPHEDRINE SULFATE-NACL 50-0.9 MG/10ML-% IV SOSY
PREFILLED_SYRINGE | INTRAVENOUS | Status: DC | PRN
  Administered 2019-04-21: 10 mg via INTRAVENOUS

## 2019-04-21 MED ORDER — CHLORHEXIDINE GLUCONATE CLOTH 2 % EX PADS: 6.0000 | MEDICATED_PAD | Freq: Once | CUTANEOUS | Status: DC

## 2019-04-21 MED ORDER — SODIUM CHLORIDE (PF) 0.9 % IJ SOLN
INTRAVENOUS | Status: DC | PRN
  Administered 2019-04-21: 5 mL via INTRADERMAL

## 2019-04-21 MED ORDER — BUPIVACAINE-EPINEPHRINE (PF) 0.5% -1:200000 IJ SOLN
INTRAMUSCULAR | Status: DC | PRN
  Administered 2019-04-21: 30 mL via PERINEURAL

## 2019-04-21 MED ORDER — BUPIVACAINE HCL (PF) 0.25 % IJ SOLN
INTRAMUSCULAR | Status: DC | PRN
  Administered 2019-04-21: 11 mL

## 2019-04-21 MED ORDER — HYDROCODONE-ACETAMINOPHEN 5-325 MG PO TABS: 1.0000 | ORAL_TABLET | Freq: Four times a day (QID) | ORAL | 0 refills | Status: DC | PRN

## 2019-04-21 MED ORDER — GABAPENTIN 300 MG PO CAPS
ORAL_CAPSULE | ORAL | Status: AC
  Filled 2019-04-21: qty 1

## 2019-04-21 MED ORDER — PROPOFOL 10 MG/ML IV BOLUS
INTRAVENOUS | Status: DC | PRN
  Administered 2019-04-21: 170 mg via INTRAVENOUS

## 2019-04-21 MED ORDER — TECHNETIUM TC 99M SULFUR COLLOID FILTERED
1.0000 | Freq: Once | INTRAVENOUS | Status: AC | PRN
  Administered 2019-04-21: 09:00:00 1 via INTRADERMAL

## 2019-04-21 MED ORDER — MIDAZOLAM HCL 2 MG/2ML IJ SOLN
INTRAMUSCULAR | Status: AC
  Filled 2019-04-21: qty 2

## 2019-04-21 MED ORDER — KETOROLAC TROMETHAMINE 30 MG/ML IJ SOLN
30.0000 mg | Freq: Once | INTRAMUSCULAR | Status: AC
  Administered 2019-04-21: 30 mg via INTRAVENOUS

## 2019-04-21 MED ORDER — LACTATED RINGERS IV SOLN: INTRAVENOUS | Status: DC

## 2019-04-21 MED ORDER — CLONIDINE HCL (ANALGESIA) 100 MCG/ML EP SOLN
EPIDURAL | Status: DC | PRN
  Administered 2019-04-21: 50 ug

## 2019-04-21 MED ORDER — SODIUM CHLORIDE (PF) 0.9 % IJ SOLN
INTRAMUSCULAR | Status: AC
  Filled 2019-04-21: qty 10

## 2019-04-21 MED ORDER — ACETAMINOPHEN 500 MG PO TABS
ORAL_TABLET | ORAL | Status: AC
  Filled 2019-04-21: qty 2

## 2019-04-21 MED ORDER — MIDAZOLAM HCL 5 MG/5ML IJ SOLN
INTRAMUSCULAR | Status: DC | PRN
  Administered 2019-04-21 (×2): 1 mg via INTRAVENOUS

## 2019-04-21 MED ORDER — FENTANYL CITRATE (PF) 100 MCG/2ML IJ SOLN
50.0000 ug | INTRAMUSCULAR | Status: DC | PRN
  Administered 2019-04-21 (×2): 50 ug via INTRAVENOUS

## 2019-04-21 MED ORDER — PROMETHAZINE HCL 25 MG/ML IJ SOLN: 6.2500 mg | INTRAMUSCULAR | Status: DC | PRN

## 2019-04-21 MED ORDER — ACETAMINOPHEN 500 MG PO TABS
1000.0000 mg | ORAL_TABLET | ORAL | Status: AC
  Administered 2019-04-21: 1000 mg via ORAL

## 2019-04-21 MED ORDER — LIDOCAINE 2% (20 MG/ML) 5 ML SYRINGE
INTRAMUSCULAR | Status: DC | PRN
  Administered 2019-04-21: 80 mg via INTRAVENOUS

## 2019-04-21 MED ORDER — ONDANSETRON HCL 4 MG/2ML IJ SOLN
INTRAMUSCULAR | Status: DC | PRN
  Administered 2019-04-21: 4 mg via INTRAVENOUS

## 2019-04-21 MED ORDER — DEXAMETHASONE SODIUM PHOSPHATE 4 MG/ML IJ SOLN
INTRAMUSCULAR | Status: DC | PRN
  Administered 2019-04-21: 4 mg via INTRAVENOUS

## 2019-04-21 MED ORDER — KETOROLAC TROMETHAMINE 30 MG/ML IJ SOLN
INTRAMUSCULAR | Status: AC
  Filled 2019-04-21: qty 1

## 2019-04-21 SURGICAL SUPPLY — 55 items
ADH SKN CLS APL DERMABOND .7 (GAUZE/BANDAGES/DRESSINGS) ×1
APL PRP STRL LF DISP 70% ISPRP (MISCELLANEOUS) ×1
APPLIER CLIP 9.375 MED OPEN (MISCELLANEOUS) ×3
APR CLP MED 9.3 20 MLT OPN (MISCELLANEOUS) ×1
BINDER BREAST LRG (GAUZE/BANDAGES/DRESSINGS) ×2 IMPLANT
BINDER BREAST MEDIUM (GAUZE/BANDAGES/DRESSINGS) IMPLANT
BINDER BREAST XLRG (GAUZE/BANDAGES/DRESSINGS) IMPLANT
BINDER BREAST XXLRG (GAUZE/BANDAGES/DRESSINGS) IMPLANT
BLADE SURG 15 STRL LF DISP TIS (BLADE) ×1 IMPLANT
BLADE SURG 15 STRL SS (BLADE) ×3
CANISTER SUC SOCK COL 7IN (MISCELLANEOUS) IMPLANT
CANISTER SUCT 1200ML W/VALVE (MISCELLANEOUS) ×3 IMPLANT
CHLORAPREP W/TINT 26 (MISCELLANEOUS) ×3 IMPLANT
CLIP APPLIE 9.375 MED OPEN (MISCELLANEOUS) ×1 IMPLANT
COVER BACK TABLE 60X90IN (DRAPES) ×3 IMPLANT
COVER MAYO STAND STRL (DRAPES) ×3 IMPLANT
COVER PROBE W GEL 5X96 (DRAPES) ×3 IMPLANT
COVER WAND RF STERILE (DRAPES) IMPLANT
DECANTER SPIKE VIAL GLASS SM (MISCELLANEOUS) IMPLANT
DERMABOND ADVANCED (GAUZE/BANDAGES/DRESSINGS) ×2
DERMABOND ADVANCED .7 DNX12 (GAUZE/BANDAGES/DRESSINGS) ×1 IMPLANT
DRAPE LAPAROSCOPIC ABDOMINAL (DRAPES) ×3 IMPLANT
DRAPE UTILITY XL STRL (DRAPES) ×3 IMPLANT
ELECT COATED BLADE 2.86 ST (ELECTRODE) ×3 IMPLANT
ELECT REM PT RETURN 9FT ADLT (ELECTROSURGICAL) ×3
ELECTRODE REM PT RTRN 9FT ADLT (ELECTROSURGICAL) ×1 IMPLANT
GLOVE BIO SURGEON STRL SZ 6.5 (GLOVE) ×1 IMPLANT
GLOVE BIO SURGEONS STRL SZ 6.5 (GLOVE) ×1
GLOVE BIOGEL PI IND STRL 7.5 (GLOVE) IMPLANT
GLOVE BIOGEL PI IND STRL 8 (GLOVE) ×1 IMPLANT
GLOVE BIOGEL PI INDICATOR 7.5 (GLOVE) ×2
GLOVE BIOGEL PI INDICATOR 8 (GLOVE) ×2
GLOVE ECLIPSE 8.0 STRL XLNG CF (GLOVE) ×3 IMPLANT
GOWN STRL REUS W/ TWL LRG LVL3 (GOWN DISPOSABLE) ×2 IMPLANT
GOWN STRL REUS W/TWL LRG LVL3 (GOWN DISPOSABLE) ×6
HEMOSTAT ARISTA ABSORB 3G PWDR (HEMOSTASIS) IMPLANT
HEMOSTAT SNOW SURGICEL 2X4 (HEMOSTASIS) IMPLANT
KIT MARKER MARGIN INK (KITS) ×3 IMPLANT
NDL HYPO 25X1 1.5 SAFETY (NEEDLE) ×1 IMPLANT
NDL SAFETY ECLIPSE 18X1.5 (NEEDLE) IMPLANT
NEEDLE HYPO 18GX1.5 SHARP (NEEDLE)
NEEDLE HYPO 25X1 1.5 SAFETY (NEEDLE) ×6 IMPLANT
NS IRRIG 1000ML POUR BTL (IV SOLUTION) ×3 IMPLANT
PACK BASIN DAY SURGERY FS (CUSTOM PROCEDURE TRAY) ×3 IMPLANT
PENCIL SMOKE EVACUATOR (MISCELLANEOUS) ×3 IMPLANT
SLEEVE SCD COMPRESS KNEE MED (MISCELLANEOUS) ×3 IMPLANT
SPONGE LAP 4X18 RFD (DISPOSABLE) ×7 IMPLANT
SUT MNCRL AB 4-0 PS2 18 (SUTURE) ×3 IMPLANT
SUT VICRYL 3-0 CR8 SH (SUTURE) ×5 IMPLANT
SYR CONTROL 10ML LL (SYRINGE) ×3 IMPLANT
TOWEL GREEN STERILE FF (TOWEL DISPOSABLE) ×3 IMPLANT
TRAY FAXITRON CT DISP (TRAY / TRAY PROCEDURE) ×3 IMPLANT
TUBE CONNECTING 20'X1/4 (TUBING) ×1
TUBE CONNECTING 20X1/4 (TUBING) ×2 IMPLANT
YANKAUER SUCT BULB TIP NO VENT (SUCTIONS) ×3 IMPLANT

## 2019-04-21 NOTE — Progress Notes (Signed)
Emotional support during breast injections °

## 2019-04-21 NOTE — Anesthesia Postprocedure Evaluation (Signed)
Anesthesia Post Note  Patient: Erin Lawson  Procedure(s) Performed: LEFT BREAST LUMPECTOMY WITH RADIOACTIVE SEED AND SENTINEL LYMPH NODE MAPPING (Left Breast)     Patient location during evaluation: PACU Anesthesia Type: General Level of consciousness: awake and alert Pain management: pain level controlled Vital Signs Assessment: post-procedure vital signs reviewed and stable Respiratory status: spontaneous breathing, nonlabored ventilation and respiratory function stable Cardiovascular status: blood pressure returned to baseline and stable Postop Assessment: no apparent nausea or vomiting Anesthetic complications: no    Last Vitals:  Vitals:   04/21/19 1245 04/21/19 1345  BP: (!) 142/78 (!) 142/70  Pulse: 75 71  Resp: 17 18  Temp:  36.5 C  SpO2: 100% 100%    Last Pain:  Vitals:   04/21/19 1345  TempSrc:   PainSc: 3                  Catalina Gravel

## 2019-04-21 NOTE — Anesthesia Procedure Notes (Signed)
Anesthesia Regional Block: Pectoralis block   Pre-Anesthetic Checklist: ,, timeout performed, Correct Patient, Correct Site, Correct Laterality, Correct Procedure, Correct Position, site marked, Risks and benefits discussed,  Surgical consent,  Pre-op evaluation,  At surgeon's request and post-op pain management  Laterality: Left  Prep: chloraprep       Needles:  Injection technique: Single-shot  Needle Type: Echogenic Needle     Needle Length: 9cm  Needle Gauge: 21     Additional Needles:   Procedures:,,,, ultrasound used (permanent image in chart),,,,  Narrative:  Start time: 04/21/2019 8:41 AM End time: 04/21/2019 8:51 AM Injection made incrementally with aspirations every 5 mL.  Performed by: Personally  Anesthesiologist: Catalina Gravel, MD  Additional Notes: No pain on injection. No increased resistance to injection. Injection made in 5cc increments.  Good needle visualization.  Patient tolerated procedure well.

## 2019-04-21 NOTE — Discharge Instructions (Signed)
[facility] Office Phone Number [phone]  BREAST BIOPSY/ PARTIAL MASTECTOMY: POST OP INSTRUCTIONS  Always review your discharge instruction sheet given to you by the facility where your surgery was performed.  IF YOU HAVE DISABILITY OR FAMILY LEAVE FORMS, YOU MUST BRING THEM TO THE OFFICE FOR PROCESSING.  DO NOT GIVE THEM TO YOUR DOCTOR.  1. A prescription for pain medication may be given to you upon discharge.  Take your pain medication as prescribed, if needed.  If narcotic pain medicine is not needed, then you may take acetaminophen (Tylenol) or ibuprofen (Advil) as needed. 2. Take your usually prescribed medications unless otherwise directed 3. If you need a refill on your pain medication, please contact your pharmacy.  They will contact our office to request authorization.  Prescriptions will not be filled after 5pm or on week-ends. 4. You should eat very light the first 24 hours after surgery, such as soup, crackers, pudding, etc.  Resume your normal diet the day after surgery. 5. Most patients will experience some swelling and bruising in the breast.  Ice packs and a good support bra will help.  Swelling and bruising can take several days to resolve.  6. It is common to experience some constipation if taking pain medication after surgery.  Increasing fluid intake and taking a stool softener will usually help or prevent this problem from occurring.  A mild laxative (Milk of Magnesia or Miralax) should be taken according to package directions if there are no bowel movements after 48 hours. 7. Unless discharge instructions indicate otherwise, you may remove your bandages 24-48 hours after surgery, and you may shower at that time.  You may have steri-strips (small skin tapes) in place directly over the incision.  These strips should be left on the skin for 7-10 days.  If your surgeon used skin glue on the incision, you may shower in 24 hours.  The glue will flake off over the  next 2-3 weeks.  Any sutures or staples will be removed at the office during your follow-up visit. 8. ACTIVITIES:  You may resume regular daily activities (gradually increasing) beginning the next day.  Wearing a good support bra or sports bra minimizes pain and swelling.  You may have sexual intercourse when it is comfortable. a. You may drive when you no longer are taking prescription pain medication, you can comfortably wear a seatbelt, and you can safely maneuver your car and apply brakes. b. RETURN TO WORK:  ______________________________________________________________________________________ 9. You should see your doctor in the office for a follow-up appointment approximately two weeks after your surgery.  Your doctor's nurse will typically make your follow-up appointment when she calls you with your pathology report.  Expect your pathology report 2-3 business days after your surgery.  You may call to check if you do not hear from us after three days. 10. OTHER INSTRUCTIONS: _______________________________________________________________________________________________ _____________________________________________________________________________________________________________________________________ _____________________________________________________________________________________________________________________________________ _____________________________________________________________________________________________________________________________________  WHEN TO CALL YOUR DOCTOR: 1. Fever over 101.0 2. Nausea and/or vomiting. 3. Extreme swelling or bruising. 4. Continued bleeding from incision. 5. Increased pain, redness, or drainage from the incision.  The clinic staff is available to answer your questions during regular business hours.  Please don't hesitate to call and ask to speak to one of the nurses for clinical concerns.  If you have a medical emergency, go to the nearest  emergency room or call 911.  A surgeon from [facility] is always on call at the hospital.  For further questions, please visit [email or web address]        No Tylenol until 2:30 PM on 04/21/2019 No Ibuprofen until 8:30PM on 04/21/19     Post Anesthesia Home Care Instructions  Activity: Get plenty of rest for the remainder of the day. A responsible individual must stay with you for 24 hours following the procedure.  For the next 24 hours, DO NOT: -Drive a car -Paediatric nurse -Drink alcoholic beverages -Take any medication unless instructed by your physician -Make any legal decisions or sign important papers.  Meals: Start with liquid foods such as gelatin or soup. Progress to regular foods as tolerated. Avoid greasy, spicy, heavy foods. If nausea and/or vomiting occur, drink only clear liquids until the nausea and/or vomiting subsides. Call your physician if vomiting continues.  Special Instructions/Symptoms: Your throat may feel dry or sore from the anesthesia or the breathing tube placed in your throat during surgery. If this causes discomfort, gargle with warm salt water. The discomfort should disappear within 24 hours.  If you had a scopolamine patch placed behind your ear for the management of post- operative nausea and/or vomiting:  1. The medication in the patch is effective for 72 hours, after which it should be removed.  Wrap patch in a tissue and discard in the trash. Wash hands thoroughly with soap and water. 2. You may remove the patch earlier than 72 hours if you experience unpleasant side effects which may include dry mouth, dizziness or visual disturbances. 3. Avoid touching the patch. Wash your hands with soap and water after contact with the patch.

## 2019-04-21 NOTE — Interval H&P Note (Signed)
History and Physical Interval Note:  04/21/2019 9:52 AM  Erin Lawson  has presented today for surgery, with the diagnosis of LEFT BREAST CANCER.  The various methods of treatment have been discussed with the patient and family. After consideration of risks, benefits and other options for treatment, the patient has consented to  Procedure(s) with comments: LEFT BREAST LUMPECTOMY WITH RADIOACTIVE SEED AND SENTINEL LYMPH NODE MAPPING (Left) - PECTORAL BLOCK as a surgical intervention.  The patient's history has been reviewed, patient examined, no change in status, stable for surgery.  I have reviewed the patient's chart and labs.  Questions were answered to the patient's satisfaction.     [place]

## 2019-04-21 NOTE — Anesthesia Procedure Notes (Signed)
Procedure Name: LMA Insertion Date/Time: 04/21/2019 10:07 AM Performed by: Alain Marion, CRNA Pre-anesthesia Checklist: Patient identified, Emergency Drugs available, Suction available and Patient being monitored Patient Re-evaluated:Patient Re-evaluated prior to induction Oxygen Delivery Method: Circle System Utilized Preoxygenation: Pre-oxygenation with 100% oxygen Induction Type: IV induction LMA: LMA inserted LMA Size: 4.0 Number of attempts: 1 Placement Confirmation: positive ETCO2 and breath sounds checked- equal and bilateral Tube secured with: Tape Dental Injury: Teeth and Oropharynx as per pre-operative assessment

## 2019-04-21 NOTE — Transfer of Care (Signed)
Immediate Anesthesia Transfer of Care Note  Patient: Erin Lawson  Procedure(s) Performed: LEFT BREAST LUMPECTOMY WITH RADIOACTIVE SEED AND SENTINEL LYMPH NODE MAPPING (Left Breast)  Patient Location: PACU  Anesthesia Type:General  Level of Consciousness: awake, alert  and oriented  Airway & Oxygen Therapy: Patient Spontanous Breathing and Patient connected to nasal cannula oxygen  Post-op Assessment: Report given to RN and Post -op Vital signs reviewed and stable  Post vital signs: Reviewed and stable  Last Vitals:  Vitals Value Taken Time  BP 133/74 04/21/19 1119  Temp    Pulse 78 04/21/19 1122  Resp 18 04/21/19 1122  SpO2 98 % 04/21/19 1122  Vitals shown include unvalidated device data.  Last Pain:  Vitals:   04/21/19 0813  TempSrc: Oral  PainSc: 0-No pain      Patients Stated Pain Goal: 1 ([id number])  Complications: No apparent anesthesia complications

## 2019-04-21 NOTE — Progress Notes (Signed)
Assisted Dr. Turk with left, ultrasound guided, pectoralis block. Side rails up, monitors on throughout procedure. See vital signs in flow sheet. Tolerated Procedure well. 

## 2019-04-21 NOTE — Anesthesia Preprocedure Evaluation (Signed)
Anesthesia Evaluation  Patient identified by MRN, date of birth, ID band Patient awake    Reviewed: Allergy & Precautions, NPO status , Patient's Chart, lab work & pertinent test results  Airway Mallampati: II  TM Distance: >3 FB Neck ROM: Full    Dental  (+) Teeth Intact, Dental Advisory Given   Pulmonary neg pulmonary ROS,    Pulmonary exam normal breath sounds clear to auscultation       Cardiovascular hypertension, Pt. on medications Normal cardiovascular exam Rhythm:Regular Rate:Normal     Neuro/Psych PSYCHIATRIC DISORDERS Anxiety negative neurological ROS     GI/Hepatic Neg liver ROS, GERD  Medicated and Controlled,  Endo/Other  diabetes, Type 2, Oral Hypoglycemic Agents  Renal/GU negative Renal ROS     Musculoskeletal  (+) Arthritis ,   Abdominal   Peds  Hematology  (+) Blood dyscrasia, anemia ,   Anesthesia Other Findings Day of surgery medications reviewed with the patient.  Left breast cancer   Reproductive/Obstetrics                             Anesthesia Physical Anesthesia Plan  ASA: III  Anesthesia Plan: General   Post-op Pain Management:  Regional for Post-op pain   Induction: Intravenous  PONV Risk Score and Plan: 3 and Midazolam, Dexamethasone and Ondansetron  Airway Management Planned: LMA  Additional Equipment:   Intra-op Plan:   Post-operative Plan: Extubation in OR  Informed Consent: I have reviewed the patients History and Physical, chart, labs and discussed the procedure including the risks, benefits and alternatives for the proposed anesthesia with the patient or authorized representative who has indicated his/her understanding and acceptance.     Dental advisory given  Plan Discussed with: CRNA  Anesthesia Plan Comments:         Anesthesia Quick Evaluation

## 2019-04-21 NOTE — Op Note (Signed)
Preoperative diagnosis: Stage II left breast cancer upper outer quadrant  Postop diagnosis: Same  Procedure: Left breast seed localized lumpectomy utilizing 2 seeds and left axillary sentinel lymph node mapping using methylene blue dye for deep left axillary sentinel lymph node biopsy  Surgeon: Erroll Luna, MD  Anesthesia: LMA with local of 0.25% Marcaine plain and left pectoral block per anesthesia  Drains: None  Specimen: Left breast tissue with 2 seeds, 2 clips and additional benign clip noted with superior calcification noted in specimen with Faxitron  IV fluids: Per anesthesia record  Indications for procedure: The patient is a 59 year old female with stage II left breast cancer.  She has completed neoadjuvant chemotherapy and presents today for breast conserving surgery with sentinel lymph node mapping.  Surgical options were discussed to include mastectomy and breast conserving surgery.  She opted for left breast lumpectomy.The procedure has been discussed with the patient. Alternatives to surgery have been discussed with the patient.  Risks of surgery include bleeding,  Infection,  Seroma formation, death,  and the need for further surgery.   The patient understands and wishes to proceed.Sentinel lymph node mapping and dissection has been discussed with the patient.  Risk of bleeding,  Infection,  Seroma formation,  Additional procedures,,  Shoulder weakness ,  Shoulder stiffness,  Nerve and blood vessel injury and reaction to the mapping dyes have been discussed.  Alternatives to surgery have been discussed with the patient.  The patient agrees to proceed.  Description of procedure: The patient was met in the holding area and questions were answered.  The neoprobe was used to localize the seed in her left breast upper outer quadrant.  She underwent pectoral block per anesthesia and injection of technetium sulfur colloid per radiology protocol.  She was then taken back to the operating.   She is placed upon upon the OR table.  Induction of general anesthesia, left breast was prepped and draped in sterile fashion timeout performed.  Proper site, patient and procedure verified.  4 cc of methylene blue dye were injected under the left nipple and massaged for 5 minutes.  Neoprobe settings were placed iodine.  There were 2 seeds and the films were available for review.  There is a cluster of calcifications superior to that as well.  Location was left breast upper outer quadrant.  Curvilinear incision made along the superior and lateral border of the nipple areolar complex.  Dissection was carried down and all tissue around both seeds and both clips were excised with a grossly negative margin.  The Faxitron image revealed both seeds, both clips as well as the benign clip in the specimen along with the calcifications.  Gross margins were negative.  Hemostasis achieved with 3-0 Vicryl as well as cautery.  I mobilized the subcutaneous breast tissue for closure.  This was done with cautery.  I then closed the deep layer 3-0 Vicryl.  4-0 Monocryl used to close skin.  Of note the cavity was clipped prior to closing.  The neoprobe settings were changed to technetium.  Hotspot identified in the left axilla.  A 3 cm incision was made dissection was carried down into the level 1 deep axillary contents.  There were 2 blue hot sentinel nodes identified.  These were level 1 and both removed.  Background counts approached 0 at this point.  Hemostasis achieved cautery.  Deep layer closed with 3-0 Vicryl and 4-0 Monocryl used to close skin.  All counts were correct.  The patient was then awoke  extubated taken recovery in satisfactory condition.  Breast binder used.

## 2019-04-22 ENCOUNTER — Encounter: Payer: Self-pay | Admitting: *Deleted

## 2019-04-22 NOTE — Addendum Note (Signed)
Addendum  created 04/22/19 1252 by Tawni Millers, CRNA   Charge Capture section accepted

## 2019-04-23 ENCOUNTER — Other Ambulatory Visit: Payer: 59

## 2019-04-23 ENCOUNTER — Ambulatory Visit: Payer: 59

## 2019-04-23 ENCOUNTER — Ambulatory Visit: Payer: 59 | Admitting: Hematology and Oncology

## 2019-04-26 LAB — SURGICAL PATHOLOGY

## 2019-04-28 ENCOUNTER — Other Ambulatory Visit: Payer: Self-pay | Admitting: *Deleted

## 2019-04-28 DIAGNOSIS — Z171 Estrogen receptor negative status [ER-]: Secondary | ICD-10-CM

## 2019-04-28 DIAGNOSIS — C50212 Malignant neoplasm of upper-inner quadrant of left female breast: Secondary | ICD-10-CM

## 2019-04-28 NOTE — Progress Notes (Signed)
Patient Care Team: Forrest Moron, MD as PCP - General (Internal Medicine) Mauro Kaufmann, RN as Oncology Nurse Navigator Rockwell Germany, RN as Oncology Nurse Navigator Erroll Luna, MD as Consulting Physician (General Surgery) Nicholas Lose, MD as Consulting Physician (Hematology and Oncology) Gery Pray, MD as Consulting Physician (Radiation Oncology)  DIAGNOSIS:    ICD-10-CM   1. Malignant neoplasm of upper-inner quadrant of left breast in female, estrogen receptor negative ([place])  C50.212    Z17.1     SUMMARY OF ONCOLOGIC HISTORY: Oncology History  Malignant neoplasm of upper-inner quadrant of left breast in female, estrogen receptor negative ([place])  10/26/2018 Initial Diagnosis   Patient palpated a left breast mass x 5 weeks. Mammogram showed a 2.8cm mass with calcifications spanning 3.2cm in the left breast and two mildly thickened axillary lymph nodes up to 0.4cm. Biopsy showed IDC, grade 3, HER-2 + (3+), ER/PR -, Ki67 50%, and no evidence of carcinoma in the left axilla.    10/28/2018 Cancer Staging   Staging form: Breast, AJCC 8th Edition - Clinical stage from 10/28/2018: Stage IIA (cT2, cN0, cM0, G2, ER-, PR-, HER2+) - Signed by Nicholas Lose, MD on 10/28/2018   11/12/2018 - 03/25/2019 Chemotherapy   The patient had palonosetron (ALOXI) injection 0.25 mg, 0.25 mg, Intravenous,  Once, 6 of 6 cycles Administration: 0.25 mg (11/12/2018), 0.25 mg (12/03/2018), 0.25 mg (02/10/2019), 0.25 mg (03/05/2019), 0.25 mg (12/23/2018), 0.25 mg (01/18/2019) pegfilgrastim (NEULASTA) injection 6 mg, 6 mg, Subcutaneous, Once, 5 of 5 cycles Administration: 6 mg (12/05/2018), 6 mg (12/25/2018), 6 mg (01/20/2019), 6 mg (02/12/2019), 6 mg (03/08/2019) pegfilgrastim-jmdb (FULPHILA) injection 6 mg, 6 mg, Subcutaneous,  Once, 1 of 1 cycle Administration: 6 mg (11/14/2018) CARBOplatin (PARAPLATIN) 500 mg in sodium chloride 0.9 % 250 mL chemo infusion, 500 mg (101.8 % of original dose 493 mg),  Intravenous,  Once, 6 of 6 cycles Dose modification:   (original dose 493 mg, Cycle 1, Reason: Provider Judgment) Administration: 500 mg (11/12/2018), 420 mg (12/03/2018), 420 mg (02/10/2019), 420 mg (03/05/2019), 420 mg (12/23/2018), 420 mg (01/18/2019) DOCEtaxel (TAXOTERE) 110 mg in sodium chloride 0.9 % 250 mL chemo infusion, 65 mg/m2 = 110 mg (100 % of original dose 65 mg/m2), Intravenous,  Once, 6 of 6 cycles Dose modification: 65 mg/m2 (original dose 65 mg/m2, Cycle 1, Reason: Provider Judgment), 50 mg/m2 (original dose 65 mg/m2, Cycle 2, Reason: Dose not tolerated), 40 mg/m2 (original dose 65 mg/m2, Cycle 5, Reason: Dose not tolerated) Administration: 110 mg (11/12/2018), 80 mg (12/03/2018), 70 mg (02/10/2019), 70 mg (03/05/2019), 70 mg (12/23/2018), 70 mg (01/18/2019) pertuzumab (PERJETA) 420 mg in sodium chloride 0.9 % 250 mL chemo infusion, 420 mg (100 % of original dose 420 mg), Intravenous, Once, 6 of 6 cycles Dose modification: 420 mg (original dose 420 mg, Cycle 1, Reason: Provider Judgment) Administration: 420 mg (11/12/2018), 420 mg (12/03/2018), 420 mg (02/10/2019), 420 mg (03/05/2019), 420 mg (12/23/2018), 420 mg (01/18/2019) fosaprepitant (EMEND) 150 mg, dexamethasone (DECADRON) 12 mg in sodium chloride 0.9 % 145 mL IVPB, , Intravenous,  Once, 6 of 6 cycles Administration:  (11/12/2018),  (12/03/2018),  (02/10/2019),  (03/05/2019),  (12/23/2018),  (01/18/2019) trastuzumab-anns (KANJINTI) 399 mg in sodium chloride 0.9 % 250 mL chemo infusion, 6 mg/kg = 399 mg (100 % of original dose 6 mg/kg), Intravenous,  Once, 5 of 5 cycles Dose modification: 6 mg/kg (original dose 6 mg/kg, Cycle 2, Reason: Other (see comments), Comment: J-code in auth referral is for Kanjinti) Administration: 399 mg (12/03/2018),  399 mg (12/23/2018), 399 mg (01/18/2019), 399 mg (02/10/2019), 399 mg (03/05/2019) trastuzumab-dkst (OGIVRI) 525 mg in sodium chloride 0.9 % 250 mL chemo infusion, 8 mg/kg = 525 mg, Intravenous,  Once, 1 of 1  cycle Administration: 525 mg (11/12/2018)  for chemotherapy treatment.    04/02/2019 -  Chemotherapy   The patient had pertuzumab (PERJETA) 420 mg in sodium chloride 0.9 % 250 mL chemo infusion, 420 mg (100 % of original dose 420 mg), Intravenous, Once, 1 of 13 cycles Dose modification: 420 mg (original dose 420 mg, Cycle 1, Reason: Provider Judgment) Administration: 420 mg (04/02/2019) trastuzumab-anns (KANJINTI) 378 mg in sodium chloride 0.9 % 250 mL chemo infusion, 6 mg/kg = 378 mg (100 % of original dose 6 mg/kg), Intravenous,  Once, 0 of 12 cycles Dose modification: 6 mg/kg (original dose 6 mg/kg, Cycle 2, Reason: Other (see comments), Comment: Preferred biosimilar ) trastuzumab-dkst (OGIVRI) 399 mg in sodium chloride 0.9 % 250 mL chemo infusion, 378 mg (100 % of original dose 6 mg/kg), Intravenous,  Once, 1 of 1 cycle Dose modification: 6 mg/kg (original dose 6 mg/kg, Cycle 1, Reason: Provider Judgment) Administration: 399 mg (04/02/2019)  for chemotherapy treatment.    04/21/2019 Surgery   Left lumpectomy ([facility]): multiple foci of residual carcinoma (0.25 cm max), clear margins, 4 left axillary lymph nodes negative.      CHIEF COMPLIANT: Follow-up s/p lumpectomy to review pathology   INTERVAL HISTORY: Erin Lawson is a 59 y.o. with above-mentioned history of left breast cancer who completed neoadjuvant chemotherapy with Olmito and Olmito.Echo on 04/06/19 showed an ejection fraction of 60-65%. She underwent a lumpectomy on 04/21/19 with Dr. Brantley Stage for which pathology showed multiple foci of residual carcinoma, clear margins, 4 left axillary lymph nodes negative for carcinoma. She presents to the clinic today to review the pathology report and discuss further treatment.  She is complaining of severe sore throat from the surgical anesthesia and procedure.  Pain in the breast has improved.  ALLERGIES:  is allergic to atenolol; poractant alfa; pork-derived products; and  meloxicam.  MEDICATIONS:  Current Outpatient Medications  Medication Sig Dispense Refill  . amLODipine (NORVASC) 5 MG tablet Take 1 tablet (5 mg total) by mouth every other day. (Patient taking differently: Take 5 mg by mouth every other day. Takes M, W, F) 90 tablet 1  . blood glucose meter kit and supplies KIT Dispense based on patient and insurance preference. Use up to four times daily as directed. (FOR ICD-9 250.00, 250.01). 1 each 0  . blood glucose meter kit and supplies Dispense based on patient and insurance preference. Use up to four times daily as directed. (FOR ICD-10 E10.9, E11.9). 1 each 0  . Ferrous Sulfate (IRON PO) Take by mouth. 1 teaspoon daily    . glucose blood (CONTOUR NEXT TEST) test strip Check sugar once daily  Dx:  DMII controlled non-insulin dependent. 100 each 3  . HYDROcodone-acetaminophen (NORCO/VICODIN) 5-325 MG tablet Take 1 tablet by mouth every 6 (six) hours as needed for moderate pain. 15 tablet 0  . LANCETS ULTRA FINE MISC Check sugar up one time daily dx: new onset DMII uncontrolled non-insulin 100 each 3   No current facility-administered medications for this visit.    PHYSICAL EXAMINATION: ECOG PERFORMANCE STATUS: 1 - Symptomatic but completely ambulatory  Vitals:   04/29/19 0953  BP: (!) 155/87  Pulse: 88  Resp: 17  Temp: 97.8 F (36.6 C)  SpO2: 100%   Filed Weights   04/29/19 0953  Weight: 140 lb 9.6 oz (63.8 kg)    LABORATORY DATA:  I have reviewed the data as listed CMP Latest Ref Rng & Units 04/15/2019 03/05/2019 02/10/2019  Glucose 70 - 99 mg/dL 166(H) 115(H) 138(H)  BUN 6 - 20 mg/dL '15 16 13  '$ Creatinine 0.44 - 1.00 mg/dL 0.64 0.71 0.73  Sodium 135 - 145 mmol/L 139 140 142  Potassium 3.5 - 5.1 mmol/L 3.9 4.2 3.5  Chloride 98 - 111 mmol/L 106 106 106  CO2 22 - 32 mmol/L '23 26 26  '$ Calcium 8.9 - 10.3 mg/dL 9.2 9.4 8.9  Total Protein 6.5 - 8.1 g/dL 6.3(L) 6.7 6.5  Total Bilirubin 0.3 - 1.2 mg/dL 0.6 0.2(L) 0.3  Alkaline Phos 38 - 126  U/L 121 129(H) 144(H)  AST 15 - 41 U/L 16 14(L) 16  ALT 0 - 44 U/L '15 17 16    '$ Lab Results  Component Value Date   WBC 8.9 04/29/2019   HGB 9.6 (L) 04/29/2019   HCT 30.5 (L) 04/29/2019   MCV 79.0 (L) 04/29/2019   PLT 350 04/29/2019   NEUTROABS 4.4 04/29/2019    ASSESSMENT & PLAN:  Malignant neoplasm of upper-inner quadrant of left breast in female, estrogen receptor negative ([place]) 10/26/2018:Patient palpated a left breast mass x 5 weeks. Mammogram showed a 2.8cm mass with calcifications spanning 3.2cm in the left breast and two mildly thickened axillary lymph nodes up to 0.4cm. Biopsy showed IDC, grade 3, HER-2 + (3+), ER/PR -, Ki67 50%, and no evidence of carcinoma in the left axilla.  Treatment plan: 1. Neoadjuvant chemotherapy with [place] Perjeta 6 cycles(completed on 03/05/19)followed by HerceptinPerjetamaintenance for 1 year  2. Followed by breast conserving surgerywith sentinel lymph node study 3. Followed by adjuvant radiation therapy ------------------------------------------------------------------------------------------------------------------------------------------------------------- 04/21/2019:Left lumpectomy ([facility]): multiple foci of residual carcinoma (0.25 cm in greatest dimension), clear margins, 4 left axillary lymph nodes negative.  Repeat prognostic panel ER negative PR negative, Ki-67 40%, HER-2 positive  Pathology counseling: I discussed the final pathology report of the patient provided  a copy of this report. I discussed the margins as well as lymph node surgeries. We also discussed the final staging along with previously performed ER/PR and HER-2/neu testing.  Treatment plan: Maintenance Herceptin Perjeta and adjuvant radiation. Severe sore throat from recent surgery: I gave her a prescription for Magic mouthwash because there is intense mucositis and pain in the back of the throat.  We will see her every 3 weeks for Herceptin Perjeta and every 6 weeks  for follow-up with me.    No orders of the defined types were placed in this encounter.  The patient has a good understanding of the overall plan. she agrees with it. she will call with any problems that may develop before the next visit here.  Total time spent: 30 mins including face to face time and time spent for planning, charting and coordination of care  Nicholas Lose, MD 04/29/2019  I, Cloyde Reams Dorshimer, am acting as scribe for Dr. Nicholas Lose.  I have reviewed the above documentation for accuracy and completeness, and I agree with the above.

## 2019-04-29 ENCOUNTER — Other Ambulatory Visit: Payer: Self-pay

## 2019-04-29 ENCOUNTER — Inpatient Hospital Stay: Payer: 59

## 2019-04-29 ENCOUNTER — Inpatient Hospital Stay (HOSPITAL_BASED_OUTPATIENT_CLINIC_OR_DEPARTMENT_OTHER): Payer: 59 | Admitting: Hematology and Oncology

## 2019-04-29 ENCOUNTER — Inpatient Hospital Stay: Payer: 59 | Attending: Hematology and Oncology

## 2019-04-29 DIAGNOSIS — C50212 Malignant neoplasm of upper-inner quadrant of left female breast: Secondary | ICD-10-CM

## 2019-04-29 DIAGNOSIS — Z5112 Encounter for antineoplastic immunotherapy: Secondary | ICD-10-CM | POA: Diagnosis present

## 2019-04-29 DIAGNOSIS — Z95828 Presence of other vascular implants and grafts: Secondary | ICD-10-CM

## 2019-04-29 DIAGNOSIS — Z171 Estrogen receptor negative status [ER-]: Secondary | ICD-10-CM | POA: Insufficient documentation

## 2019-04-29 LAB — CMP (CANCER CENTER ONLY)
ALT: 11 U/L (ref 0–44)
AST: 13 U/L — ABNORMAL LOW (ref 15–41)
Albumin: 3.7 g/dL (ref 3.5–5.0)
Alkaline Phosphatase: 138 U/L — ABNORMAL HIGH (ref 38–126)
Anion gap: 10 (ref 5–15)
BUN: 16 mg/dL (ref 6–20)
CO2: 25 mmol/L (ref 22–32)
Calcium: 9.3 mg/dL (ref 8.9–10.3)
Chloride: 107 mmol/L (ref 98–111)
Creatinine: 0.78 mg/dL (ref 0.44–1.00)
GFR, Est AFR Am: >60 mL/min (ref >60)
GFR, Estimated: >60 mL/min (ref >60)
Glucose, Bld: 138 mg/dL — ABNORMAL HIGH (ref 70–99)
Potassium: 3.6 mmol/L (ref 3.5–5.1)
Sodium: 142 mmol/L (ref 135–145)
Total Bilirubin: 0.4 mg/dL (ref 0.3–1.2)
Total Protein: 7.3 g/dL (ref 6.5–8.1)

## 2019-04-29 LAB — CBC WITH DIFFERENTIAL (CANCER CENTER ONLY)
Abs Immature Granulocytes: 0.04 10*3/uL (ref 0.00–0.07)
Basophils Absolute: 0 10*3/uL (ref 0.0–0.1)
Basophils Relative: 0 %
Eosinophils Absolute: 0.3 10*3/uL (ref 0.0–0.5)
Eosinophils Relative: 3 %
HCT: 30.5 % — ABNORMAL LOW (ref 36.0–46.0)
Hemoglobin: 9.6 g/dL — ABNORMAL LOW (ref 12.0–15.0)
Immature Granulocytes: 0 %
Lymphocytes Relative: 37 %
Lymphs Abs: 3.3 10*3/uL (ref 0.7–4.0)
MCH: 24.9 pg — ABNORMAL LOW (ref 26.0–34.0)
MCHC: 31.5 g/dL (ref 30.0–36.0)
MCV: 79 fL — ABNORMAL LOW (ref 80.0–100.0)
Monocytes Absolute: 0.9 10*3/uL (ref 0.1–1.0)
Monocytes Relative: 10 %
Neutro Abs: 4.4 10*3/uL (ref 1.7–7.7)
Neutrophils Relative %: 50 %
Platelet Count: 350 10*3/uL (ref 150–400)
RBC: 3.86 MIL/uL — ABNORMAL LOW (ref 3.87–5.11)
RDW: 14.5 % (ref 11.5–15.5)
WBC Count: 8.9 10*3/uL (ref 4.0–10.5)
nRBC: 0 % (ref 0.0–0.2)

## 2019-04-29 MED ORDER — DIPHENHYDRAMINE HCL 25 MG PO CAPS
25.0000 mg | ORAL_CAPSULE | Freq: Once | ORAL | Status: AC
  Administered 2019-04-29: 25 mg via ORAL

## 2019-04-29 MED ORDER — SODIUM CHLORIDE 0.9 % IV SOLN
Freq: Once | INTRAVENOUS | Status: AC
  Filled 2019-04-29: qty 250

## 2019-04-29 MED ORDER — MAGIC MOUTHWASH W/LIDOCAINE: 5.0000 mL | Freq: Three times a day (TID) | ORAL | 0 refills | Status: DC | PRN

## 2019-04-29 MED ORDER — TRASTUZUMAB-ANNS CHEMO 150 MG IV SOLR
6.0000 mg/kg | Freq: Once | INTRAVENOUS | Status: AC
  Administered 2019-04-29: 378 mg via INTRAVENOUS
  Filled 2019-04-29: qty 18

## 2019-04-29 MED ORDER — HEPARIN SOD (PORK) LOCK FLUSH 100 UNIT/ML IV SOLN
500.0000 [IU] | Freq: Once | INTRAVENOUS | Status: AC | PRN
  Administered 2019-04-29: 500 [IU]
  Filled 2019-04-29: qty 5

## 2019-04-29 MED ORDER — DIPHENHYDRAMINE HCL 25 MG PO CAPS
ORAL_CAPSULE | ORAL | Status: AC
  Filled 2019-04-29: qty 1

## 2019-04-29 MED ORDER — ACETAMINOPHEN 325 MG PO TABS
650.0000 mg | ORAL_TABLET | Freq: Once | ORAL | Status: AC
  Administered 2019-04-29: 650 mg via ORAL

## 2019-04-29 MED ORDER — SODIUM CHLORIDE 0.9% FLUSH
10.0000 mL | INTRAVENOUS | Status: DC | PRN
  Administered 2019-04-29: 10 mL
  Filled 2019-04-29: qty 10

## 2019-04-29 MED ORDER — ACETAMINOPHEN 325 MG PO TABS
ORAL_TABLET | ORAL | Status: AC
  Filled 2019-04-29: qty 2

## 2019-04-29 MED ORDER — SODIUM CHLORIDE 0.9 % IV SOLN
420.0000 mg | Freq: Once | INTRAVENOUS | Status: AC
  Administered 2019-04-29: 420 mg via INTRAVENOUS
  Filled 2019-04-29: qty 14

## 2019-04-29 NOTE — Patient Instructions (Signed)

## 2019-04-29 NOTE — Assessment & Plan Note (Signed)
10/26/2018:Patient palpated a left breast mass x 5 weeks. Mammogram showed a 2.8cm mass with calcifications spanning 3.2cm in the left breast and two mildly thickened axillary lymph nodes up to 0.4cm. Biopsy showed IDC, grade 3, HER-2 + (3+), ER/PR -, Ki67 50%, and no evidence of carcinoma in the left axilla.  Treatment plan: 1. Neoadjuvant chemotherapy with [place] Perjeta 6 cycles(completed on 03/05/19)followed by HerceptinPerjetamaintenance for 1 year  2. Followed by breast conserving surgerywith sentinel lymph node study 3. Followed by adjuvant radiation therapy ------------------------------------------------------------------------------------------------------------------------------------------------------------- 04/21/2019:Left lumpectomy ([facility]): multiple foci of residual carcinoma (0.25 cm in greatest dimension), clear margins, 4 left axillary lymph nodes negative.  Repeat prognostic panel ER negative PR negative, Ki-67 40%, HER-2 positive  Pathology counseling: I discussed the final pathology report of the patient provided  a copy of this report. I discussed the margins as well as lymph node surgeries. We also discussed the final staging along with previously performed ER/PR and HER-2/neu testing.  Treatment plan: Maintenance Herceptin Perjeta and adjuvant radiation. We will see her every 3 weeks for Herceptin Perjeta and every 6 weeks for follow-up with me.

## 2019-04-29 NOTE — Patient Instructions (Signed)
COVID-19 Vaccine Information can be found at: [email or web address] For questions related to vaccine distribution or appointments, please email [email or web address] or call [phone].   [facility] Discharge Instructions for Patients Receiving Immunotherapy  Today you received the following immunotherpay agents: Transtuzumab-anns (Kanjinti) and Pertuzumab (Perjeta)  To help prevent nausea and vomiting after your treatment, we encourage you to take your nausea medication as directed by your provider.   If you develop nausea and vomiting that is not controlled by your nausea medication, call the clinic.   BELOW ARE SYMPTOMS THAT SHOULD BE REPORTED IMMEDIATELY:  *FEVER GREATER THAN 100.5 F  *CHILLS WITH OR WITHOUT FEVER  NAUSEA AND VOMITING THAT IS NOT CONTROLLED WITH YOUR NAUSEA MEDICATION  *UNUSUAL SHORTNESS OF BREATH  *UNUSUAL BRUISING OR BLEEDING  TENDERNESS IN MOUTH AND THROAT WITH OR WITHOUT PRESENCE OF ULCERS  *URINARY PROBLEMS  *BOWEL PROBLEMS  UNUSUAL RASH Items with * indicate a potential emergency and should be followed up as soon as possible.  Feel free to call the clinic should you have any questions or concerns. The clinic phone number is [phone].  Please show the CHEMO ALERT CARD at check-in to the Emergency Department and triage nurse.  Coronavirus (COVID-19) Are you at risk?  Are you at risk for the Coronavirus (COVID-19)?  To be considered HIGH RISK for Coronavirus (COVID-19), you have to meet the following criteria:  . Traveled to [place], [place], [name], [place] or [place]; or in the [place] to [place], [place], [place], or [place]; and have fever, cough, and shortness of breath within the last 2 weeks of travel OR . Been in close contact with a person diagnosed with COVID-19 within the last 2 weeks and have fever, cough, and shortness of breath . IF YOU DO NOT  MEET THESE CRITERIA, YOU ARE CONSIDERED LOW RISK FOR COVID-19.  What to do if you are HIGH RISK for COVID-19?  . If you are having a medical emergency, call 911. . Seek medical care right away. Before you go to a doctor's office, urgent care or emergency department, call ahead and tell them about your recent travel, contact with someone diagnosed with COVID-19, and your symptoms. You should receive instructions from your physician's office regarding next steps of care.  . When you arrive at healthcare provider, tell the healthcare staff immediately you have returned from visiting [place], [place], [place], [place] or [name]; or traveled in the [place] to [place], [place], [place], or [place]; in the last two weeks or you have been in close contact with a person diagnosed with COVID-19 in the last 2 weeks.   . Tell the health care staff about your symptoms: fever, cough and shortness of breath. . After you have been seen by a medical provider, you will be either: o Tested for (COVID-19) and discharged home on quarantine except to seek medical care if symptoms worsen, and asked to  - Stay home and avoid contact with others until you get your results (4-5 days)  - Avoid travel on public transportation if possible (such as bus, train, or airplane) or o Sent to the Emergency Department by EMS for evaluation, COVID-19 testing, and possible admission depending on your condition and test results.  What to do if you are LOW RISK for COVID-19?  Reduce your risk of any infection by using the same precautions used for avoiding the common cold or flu:  . Wash your hands often with soap and warm   water for at least 20 seconds.  If soap and water are not readily available, use an alcohol-based hand sanitizer with at least 60% alcohol.  . If coughing or sneezing, cover your mouth and nose by coughing or sneezing into the elbow areas of your shirt or coat, into a tissue or into your sleeve (not your  hands). . Avoid shaking hands with others and consider head nods or verbal greetings only. . Avoid touching your eyes, nose, or mouth with unwashed hands.  . Avoid close contact with people who are sick. . Avoid places or events with large numbers of people in one location, like concerts or sporting events. . Carefully consider travel plans you have or are making. . If you are planning any travel outside or inside the [name], visit the CDC's Travelers' Health webpage for the latest health notices. . If you have some symptoms but not all symptoms, continue to monitor at home and seek medical attention if your symptoms worsen. . If you are having a medical emergency, call 911.   [place] / e-Visit: [email or web address]         [facility]: [facility]: [phone]                   [facility]: [phone]

## 2019-05-08 ENCOUNTER — Encounter: Payer: Self-pay | Admitting: Hematology and Oncology

## 2019-05-10 ENCOUNTER — Other Ambulatory Visit: Payer: Self-pay

## 2019-05-10 ENCOUNTER — Encounter: Payer: Self-pay | Admitting: Radiation Oncology

## 2019-05-10 ENCOUNTER — Ambulatory Visit
Admission: RE | Admit: 2019-05-10 | Discharge: 2019-05-10 | Disposition: A | Discharge status: Home / Self Care | Payer: 59 | Source: Ambulatory Visit | Attending: Radiation Oncology | Admitting: Radiation Oncology

## 2019-05-10 VITALS — BP 170/83 | HR 64 | Temp 99.1°F | Resp 18 | Ht 61.0 in | Wt 145.6 lb

## 2019-05-10 DIAGNOSIS — Z171 Estrogen receptor negative status [ER-]: Secondary | ICD-10-CM | POA: Insufficient documentation

## 2019-05-10 DIAGNOSIS — Z79899 Other long term (current) drug therapy: Secondary | ICD-10-CM | POA: Insufficient documentation

## 2019-05-10 DIAGNOSIS — C50212 Malignant neoplasm of upper-inner quadrant of left female breast: Secondary | ICD-10-CM

## 2019-05-10 DIAGNOSIS — N644 Mastodynia: Secondary | ICD-10-CM | POA: Diagnosis not present

## 2019-05-10 NOTE — Progress Notes (Signed)
Pharmacist Chemotherapy Monitoring - Follow Up Assessment    I verify that I have reviewed each item in the below checklist:  . Regimen for the patient is scheduled for the appropriate day and plan matches scheduled date. Marland Kitchen Appropriate non-routine labs are ordered dependent on drug ordered. . If applicable, additional medications reviewed and ordered per protocol based on lifetime cumulative doses and/or treatment regimen.   Plan for follow-up and/or issues identified: Yes . I-vent associated with next due treatment: Yes . MD and/or nursing notified: Yes  Philomena Course 05/10/2019 2:09 PM

## 2019-05-10 NOTE — Progress Notes (Signed)
Location of Breast Cancer:Stage T2 Left Breast UIQ, Invasive Ductal Carcinoma with DCIS, ER- / PR- / Her2+, Grade 2  Histology per Pathology Report:  FINAL MICROSCOPIC DIAGNOSIS:   A. BREAST, LEFT, LUMPECTOMY:  - Residual invasive ductal carcinoma, multiple foci, status post  neoadjuvant treatment.  - Margins of resection are not involved (Closest margin: < 1 mm,  inferior).  - See oncology table.   B. SENTINEL LYMPH NODE, LEFT AXILLARY, BIOPSY:  - One lymph node, negative for carcinoma (0/1).   C. SENTINEL LYMPH NODE, LEFT AXILLARY, BIOPSY:  - One lymph node, negative for carcinoma (0/1).   D. SENTINEL LYMPH NODE, LEFT AXILLARY, BIOPSY:  - One lymph node, negative for carcinoma (0/1).   E. SENTINEL LYMPH NODE, LEFT AXILLARY, BIOPSY:  - One lymph node, negative for carcinoma (0/1).   Receptor Status: ER(-), PR (-), Her2-neu (+), Ki-(50%)  Did patient present with symptoms (if so, please note symptoms) or was this found on screening mammogram?   She presented with a palpable area of concern in the left breast for approximately 5 weeks. She underwent bilateral diagnostic mammography with tomography and left breast ultrasonography at [facility] on 10/13/2018 showing: 2.8 cm irregular mass in the left breast at 11:30; two mildly thickened left axillary lymph nodes suspicious for metastatic disease; no evidence of malignancy in the right breast.  Past/Anticipated interventions by surgeon, if [id number] breast lumpectomy with sentinel lymph node biopsy. Surgery was done by Dr. Brantley Stage on 04/21/2019 revealing clear margins, 4 left axillary lymph nodes were negative.  Past/Anticipated interventions by medical oncology, if any: Chemotherapy   11/12/2018 - 03/25/2019 Chemotherapy    The patient had palonosetron (ALOXI) injection 0.25 mg, 0.25 mg, Intravenous,  Once, 6 of 6 cycles Administration: 0.25 mg (11/12/2018), 0.25 mg (12/03/2018), 0.25 mg (02/10/2019), 0.25 mg (03/05/2019), 0.25 mg  (12/23/2018), 0.25 mg (01/18/2019) pegfilgrastim (NEULASTA) injection 6 mg, 6 mg, Subcutaneous, Once, 5 of 5 cycles Administration: 6 mg (12/05/2018), 6 mg (12/25/2018), 6 mg (01/20/2019), 6 mg (02/12/2019), 6 mg (03/08/2019) pegfilgrastim-jmdb (FULPHILA) injection 6 mg, 6 mg, Subcutaneous,  Once, 1 of 1 cycle Administration: 6 mg (11/14/2018) CARBOplatin (PARAPLATIN) 500 mg in sodium chloride 0.9 % 250 mL chemo infusion, 500 mg (101.8 % of original dose 493 mg), Intravenous,  Once, 6 of 6 cycles Dose modification:   (original dose 493 mg, Cycle 1, Reason: Provider Judgment) Administration: 500 mg (11/12/2018), 420 mg (12/03/2018), 420 mg (02/10/2019), 420 mg (03/05/2019), 420 mg (12/23/2018), 420 mg (01/18/2019) DOCEtaxel (TAXOTERE) 110 mg in sodium chloride 0.9 % 250 mL chemo infusion, 65 mg/m2 = 110 mg (100 % of original dose 65 mg/m2), Intravenous,  Once, 6 of 6 cycles Dose modification: 65 mg/m2 (original dose 65 mg/m2, Cycle 1, Reason: Provider Judgment), 50 mg/m2 (original dose 65 mg/m2, Cycle 2, Reason: Dose not tolerated), 40 mg/m2 (original dose 65 mg/m2, Cycle 5, Reason: Dose not tolerated) Administration: 110 mg (11/12/2018), 80 mg (12/03/2018), 70 mg (02/10/2019), 70 mg (03/05/2019), 70 mg (12/23/2018), 70 mg (01/18/2019) pertuzumab (PERJETA) 420 mg in sodium chloride 0.9 % 250 mL chemo infusion, 420 mg (100 % of original dose 420 mg), Intravenous, Once, 6 of 6 cycles Dose modification: 420 mg (original dose 420 mg, Cycle 1, Reason: Provider Judgment) Administration: 420 mg (11/12/2018), 420 mg (12/03/2018), 420 mg (02/10/2019), 420 mg (03/05/2019), 420 mg (12/23/2018), 420 mg (01/18/2019) fosaprepitant (EMEND) 150 mg, dexamethasone (DECADRON) 12 mg in sodium chloride 0.9 % 145 mL IVPB, , Intravenous,  Once, 6 of 6 cycles  Administration:  (11/12/2018),  (12/03/2018),  (02/10/2019),  (03/05/2019),  (12/23/2018),  (01/18/2019) trastuzumab-anns (KANJINTI) 399 mg in sodium chloride 0.9 % 250 mL chemo infusion, 6 mg/kg  = 399 mg (100 % of original dose 6 mg/kg), Intravenous,  Once, 5 of 5 cycles Dose modification: 6 mg/kg (original dose 6 mg/kg, Cycle 2, Reason: Other (see comments), Comment: J-code in auth referral is for Kanjinti) Administration: 399 mg (12/03/2018), 399 mg (12/23/2018), 399 mg (01/18/2019), 399 mg (02/10/2019), 399 mg (03/05/2019) trastuzumab-dkst (OGIVRI) 525 mg in sodium chloride 0.9 % 250 mL chemo infusion, 8 mg/kg = 525 mg, Intravenous,  Once, 1 of 1 cycle Administration: 525 mg (11/12/2018)  for chemotherapy treatment.     04/02/2019 -  Chemotherapy    The patient had pertuzumab (PERJETA) 420 mg in sodium chloride 0.9 % 250 mL chemo infusion, 420 mg (100 % of original dose 420 mg), Intravenous, Once, 1 of 13 cycles Dose modification: 420 mg (original dose 420 mg, Cycle 1, Reason: Provider Judgment) Administration: 420 mg (04/02/2019) trastuzumab-anns (KANJINTI) 378 mg in sodium chloride 0.9 % 250 mL chemo infusion, 6 mg/kg = 378 mg (100 % of original dose 6 mg/kg), Intravenous,  Once, 0 of 12 cycles Dose modification: 6 mg/kg (original dose 6 mg/kg, Cycle 2, Reason: Other (see comments), Comment: Preferred biosimilar ) trastuzumab-dkst (OGIVRI) 399 mg in sodium chloride 0.9 % 250 mL chemo infusion, 378 mg (100 % of original dose 6 mg/kg), Intravenous,  Once, 1 of 1 cycle Dose modification: 6 mg/kg (original dose 6 mg/kg, Cycle 1, Reason: Provider Judgment) Administration: 399 mg (04/02/2019)  for chemotherapy treatment.      Lymphedema issues, if any:  No evidence of lymphedema noted. Encouraged patient to begin breast surgery recovery exercise because ROM of left arm is slightly limited.   Pain issues, if any:  Yes. Reports intermittent sharp shooting pain in her left breast. Also, reports pain deep in her left ribs. Patient's surgical site well approximated and bruised. Edema in left breast noted. Patient evaluated by Dr. Brantley Stage today and told she is healing well. She will follow up with  Dr. Brantley Stage again in two weeks.   SAFETY ISSUES: Prior radiation? no Pacemaker/ICD? no Possible current pregnancy?no Is the patient on methotrexate? 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 Had twins but now had 3 grandkids. Lives with son.     Joaquim Lai, RN 05/10/2019,9:08 AM

## 2019-05-10 NOTE — Progress Notes (Signed)
Radiation Oncology         [phone] ________________________________  Name: Erin Lawson MRN: [id number]  Date: 05/10/2019  DOB: [birth year]  Re-Evaluation Note  CC: Forrest Moron, MD  Nicholas Lose, MD    ICD-10-CM   1. Malignant neoplasm of upper-inner quadrant of left breast in female, estrogen receptor negative ([place])  C50.212    Z17.1     Diagnosis: Stage pT1a, pN0 Left Breast UIQ, Invasive Ductal Carcinoma with DCIS, ER- / PR- / Her2+, Grade 3  Narrative:  The patient returns today to discuss radiation treatment options. She was seen in the multidisciplinary breast clinic on 10/28/2018. At that time, it was recommended that the patient proceed with bilateral breast MRI, neoadjuvant chemotherapy, left lumpectomy with sentinel lymph node biopsy, and adjuvant radiation therapy.  MRI of bilateral breasts on 10/28/2018 showed an enhancing mass in the superomedial left breast consistent with the patient's biopsy-proven malignancy. There was also an associated non-mass enhancement extending superiorly from the index lesion by approximately 3 cm and an indeterminate 5-6 mm enhancing mass in the upper outer left breast, just inferior and lateral to the index lesion, that may have been an intramammary lymph node. Finally, there was a likely benign dermal lesion along the central inferior right breast without MRI evidence of malignancy within the right breast.  Echocardiogram on 11/05/2018 showed an EF of 65-70%.  MRI-guided core needle biopsy on 11/19/2019 revealed grade 3 invasive ductal carcinoma with ductal carcinoma in situ of the upper inner quadrant of the left breast. There was also found to be sclerosing adenosis and fibrocystic changes with apocrine metaplasia without malignancy of the upper outer quadrant of the left breast.  The patient underwent neoadjuvant chemotherapy with TCH Perjeta x 6 cycles from 11/12/2018 - 03/05/2019 followed by Herceptin Perjeta maintenance  for one year under the care of Dr. Lindi Adie.  MRI of bilateral breasts on 03/11/2019 showed a positive imaging response to neoadjuvant chemotherapy with near complete resolution of biopsy-proven malignancy over the anterior third of the mid to upper left breast with only subtle residual 1.5 cm area of non-mass enhancement. No abnormal mass or enhancement within the right breast.  Echocardiogram on 04/06/2019 showed an EF of 60-65%.  The patient underwent a left breast lumpectomy with sentinel lymph node biopsy on 04/21/2019 performed by Dr. Brantley Stage. Pathology from the procedure revealed residual invasive ductal carcinoma, multi foci, status post neoadjuvant treatment. Margins of resection were not involved. Four left axillary sentinel lymph nodes were biopsied and all were negative for carcinoma.  On review of systems, the patient reports intermittent sharp, shooting left breast pain and pain deep in her left ribs. She denies cough or breathing problems and any other symptoms.  She denies any problems with swelling in her left arm or hand.  She has limited mobility of her left shoulder at this time..   Allergies:  is allergic to atenolol; poractant alfa; pork-derived products; and meloxicam.  Meds: Current Outpatient Medications  Medication Sig Dispense Refill  . amLODipine (NORVASC) 5 MG tablet Take 1 tablet (5 mg total) by mouth every other day. (Patient taking differently: Take 5 mg by mouth every other day. Takes M, W, F) 90 tablet 1  . blood glucose meter kit and supplies KIT Dispense based on patient and insurance preference. Use up to four times daily as directed. (FOR ICD-9 250.00, 250.01). 1 each 0  . blood glucose meter kit and supplies Dispense based on patient and insurance preference. Use up  to four times daily as directed. (FOR ICD-10 E10.9, E11.9). 1 each 0  . glucose blood (CONTOUR NEXT TEST) test strip Check sugar once daily  Dx:  DMII controlled non-insulin dependent. 100 each 3    . LANCETS ULTRA FINE MISC Check sugar up one time daily dx: new onset DMII uncontrolled non-insulin 100 each 3  . magic mouthwash w/lidocaine SOLN Take 5 mLs by mouth 3 (three) times daily as needed for mouth pain. 100 mL 0   No current facility-administered medications for this encounter.    Physical Findings: The patient is in no acute distress. Patient is alert and oriented.  height is '5\' 1"'$  (1.549 m) and weight is 145 lb 9.6 oz (66 kg). Her temperature is 99.1 F (37.3 C). Her blood pressure is 170/83 (abnormal) and her pulse is 64. Her respiration is 18 and oxygen saturation is 99%.  No significant changes. Lungs are clear to auscultation bilaterally. Heart has regular rate and rhythm. No palpable cervical, supraclavicular, or axillary adenopathy. Abdomen soft, non-tender, normal bowel sounds. Right breast: no palpable mass, nipple discharge or bleeding. Left breast: Well-healing lumpectomy scar and sentinel node scar.  No signs of infection within the breast.  Patient has limited mobility in her left arm and shoulder  Lab Findings: Lab Results  Component Value Date   WBC 8.9 04/29/2019   HGB 9.6 (L) 04/29/2019   HCT 30.5 (L) 04/29/2019   MCV 79.0 (L) 04/29/2019   PLT 350 04/29/2019    Radiographic Findings: NM Sentinel Node Inj-No Rpt (Breast)  Result Date: 04/21/2019 Sulfur colloid was injected by the nuclear medicine technologist for melanoma sentinel node.   MM Breast Surgical Specimen  Result Date: 04/21/2019 CLINICAL DATA:  Status post radioactive seed localization of the LEFT breast. EXAM: SPECIMEN RADIOGRAPH OF THE LEFT BREAST COMPARISON:  04/20/2019 and earlier FINDINGS: Status post excision of the LEFT breast. The ribbon shaped, dumbbell shaped, and cylinder shaped clips are all present within the specimen. Two radioactive seeds are identified, adjacent to the ribbon and dumbbell shaped clips. Findings are discussed with the operating room nurse at the time of  interpretation. IMPRESSION: Specimen radiograph of the left breast. Electronically Signed   By: Nolon Nations M.D.   On: 04/21/2019 10:39   MM LT RADIOACTIVE SEED LOC MAMMO GUIDE  Result Date: 04/20/2019 CLINICAL DATA:  59 year old female presenting for radioactive seed localization of the left breast prior to lumpectomy. EXAM: MAMMOGRAPHIC GUIDED RADIOACTIVE SEED LOCALIZATION OF THE LEFT BREAST COMPARISON:  Previous exam(s). FINDINGS: Patient presents for radioactive seed localization prior to left breast lumpectomy. I met with the patient and we discussed the procedure of seed localization including benefits and alternatives. We discussed the high likelihood of a successful procedure. We discussed the risks of the procedure including infection, bleeding, tissue injury and further surgery. We discussed the low dose of radioactivity involved in the procedure. Informed, written consent was given. The usual time-out protocol was performed immediately prior to the procedure. Using mammographic guidance, sterile technique, 1% lidocaine and an I-125 radioactive seed, the dumbbell-shaped biopsy marking clip in the superior anterior left breast was localized using a medial approach. The follow-up mammogram images confirm the seed in the expected location and were marked for Dr. Brantley Stage. Follow-up survey of the patient confirms presence of the radioactive seed. Order number of I-125 seed:  [id number]. Total activity:  3.419 millicuries reference Date: 03/29/2019 Using mammographic guidance, sterile technique, 1% lidocaine and an I-125 radioactive seed, the ribbon biopsy marking clip  in the superior left breast was localized using a medial approach. The follow-up mammogram images confirm the seed in the expected location and were marked for Dr. Brantley Stage. Follow-up survey of the patient confirms presence of the radioactive seed. Order number of I-125 seed:  [id number]. Total activity:  0.254 millicuries reference Date:  03/29/2019 The patient tolerated the procedure well and was released from the [facility]. She was given instructions regarding seed removal. IMPRESSION: Radioactive seed localization of 2 sites in the left breast. No apparent complications. Electronically Signed   By: Ammie Ferrier M.D.   On: 04/20/2019 14:10   MM LT RAD SEED EA ADD LESION LOC MAMMO  Result Date: 04/20/2019 CLINICAL DATA:  59 year old female presenting for radioactive seed localization of the left breast prior to lumpectomy. EXAM: MAMMOGRAPHIC GUIDED RADIOACTIVE SEED LOCALIZATION OF THE LEFT BREAST COMPARISON:  Previous exam(s). FINDINGS: Patient presents for radioactive seed localization prior to left breast lumpectomy. I met with the patient and we discussed the procedure of seed localization including benefits and alternatives. We discussed the high likelihood of a successful procedure. We discussed the risks of the procedure including infection, bleeding, tissue injury and further surgery. We discussed the low dose of radioactivity involved in the procedure. Informed, written consent was given. The usual time-out protocol was performed immediately prior to the procedure. Using mammographic guidance, sterile technique, 1% lidocaine and an I-125 radioactive seed, the dumbbell-shaped biopsy marking clip in the superior anterior left breast was localized using a medial approach. The follow-up mammogram images confirm the seed in the expected location and were marked for Dr. Brantley Stage. Follow-up survey of the patient confirms presence of the radioactive seed. Order number of I-125 seed:  [id number]. Total activity:  8.315 millicuries reference Date: 03/29/2019 Using mammographic guidance, sterile technique, 1% lidocaine and an I-125 radioactive seed, the ribbon biopsy marking clip in the superior left breast was localized using a medial approach. The follow-up mammogram images confirm the seed in the expected location and were marked for Dr.  Brantley Stage. Follow-up survey of the patient confirms presence of the radioactive seed. Order number of I-125 seed:  [id number]. Total activity:  1.062 millicuries reference Date: 03/29/2019 The patient tolerated the procedure well and was released from the [facility]. She was given instructions regarding seed removal. IMPRESSION: Radioactive seed localization of 2 sites in the left breast. No apparent complications. Electronically Signed   By: Ammie Ferrier M.D.   On: 04/20/2019 14:10    Impression: Stage pT1a, pN0 Left Breast UIQ, Invasive Ductal Carcinoma with DCIS, ER- / PR- / Her2+, Grade 3  Patient would be a good candidate for adjuvant radiation therapy directed at the left breast.  She would appear to be a good candidate for hypofractionated accelerated radiation therapy over approximately 4 weeks.  I discussed the overall course of treatment side effects and potential toxicities of radiation therapy in the situation with the patient.  She appears to understand and wishes to proceed with planned course of treatment.  Will use cardiac sparing techniques if necessary.  Plan:  Patient will be scheduled for CT simulation next week with treatments to begin in approximately 2 weeks.  This should allow adequate time for her left arm and shoulder mobility.  -----------------------------------  Blair Promise, PhD, MD  This document serves as a record of services personally performed by Gery Pray, MD. It was created on his behalf by Clerance Lav, a trained medical scribe. The creation of this record is based on the scribe's  personal observations and the provider's statements to them. This document has been checked and approved by the attending provider.

## 2019-05-10 NOTE — Progress Notes (Signed)
See progress note under physician encounter. 

## 2019-05-11 ENCOUNTER — Encounter: Payer: Self-pay | Admitting: *Deleted

## 2019-05-12 ENCOUNTER — Telehealth: Payer: Self-pay | Admitting: Hematology and Oncology

## 2019-05-12 NOTE — Telephone Encounter (Signed)
R/s appt per 4/28 sch message - nothing available on 5/28 for pt did not want to be scheduled with [facility] - scheduled pt on 6/2 - pt aware of appt date and time

## 2019-05-14 ENCOUNTER — Ambulatory Visit: Payer: 59

## 2019-05-14 NOTE — Progress Notes (Signed)
Pharmacist Chemotherapy Monitoring - Follow Up Assessment    I verify that I have reviewed each item in the below checklist:  . Regimen for the patient is scheduled for the appropriate day and plan matches scheduled date. Marland Kitchen Appropriate non-routine labs are ordered dependent on drug ordered. . If applicable, additional medications reviewed and ordered per protocol based on lifetime cumulative doses and/or treatment regimen.   Plan for follow-up and/or issues identified: No . I-vent associated with next due treatment: No . MD and/or nursing notified: No  Philomena Course 05/14/2019 11:40 AM

## 2019-05-18 ENCOUNTER — Ambulatory Visit
Admission: RE | Admit: 2019-05-18 | Discharge: 2019-05-18 | Disposition: A | Discharge status: Home / Self Care | Payer: 59 | Source: Ambulatory Visit | Attending: Radiation Oncology | Admitting: Radiation Oncology

## 2019-05-18 ENCOUNTER — Other Ambulatory Visit: Payer: Self-pay

## 2019-05-18 DIAGNOSIS — C50212 Malignant neoplasm of upper-inner quadrant of left female breast: Secondary | ICD-10-CM | POA: Diagnosis present

## 2019-05-18 DIAGNOSIS — Z510 Encounter for antineoplastic radiation therapy: Secondary | ICD-10-CM | POA: Diagnosis present

## 2019-05-19 ENCOUNTER — Encounter: Payer: Self-pay | Admitting: Orthopaedic Surgery

## 2019-05-19 ENCOUNTER — Ambulatory Visit (INDEPENDENT_AMBULATORY_CARE_PROVIDER_SITE_OTHER): Payer: 59 | Admitting: Orthopaedic Surgery

## 2019-05-19 DIAGNOSIS — M25562 Pain in left knee: Secondary | ICD-10-CM | POA: Diagnosis not present

## 2019-05-19 NOTE — Progress Notes (Signed)
HPI: Ms. Erin Lawson returns today 6 weeks follow-up left knee.  She states the left knee is doing better.  She states she had no pain for 1 week.  States she is about 90% better now.  Does feel like left leg is weak.  She has difficulty going up and down stairs.  She does level therapy for.  Again she is undergoing chemo therapy and radiation therapy for breast cancer.  She states she is not really been exercising or doing any walking since September.  She did go for about a 2 mile hike for the first time the other day was able to get through it.  She has had no real mechanical symptoms of the left knee.  She does feel like the knee needs to pop but she has had no painful popping.  Review of systems: Please see HPI otherwise negative.  Physical exam: General well-developed well-nourished female no acute distress mood and affect appropriate Bilateral knees good range of motion both knees.  Left knee was slight patellofemoral crepitus.  No abnormal warmth erythema of either knee.  McMurray's is negative bilaterally.  Slight tenderness over the lateral joint line of the left knee.  No instability valgus varus stressing of either knee.  Impression: Left knee pain  Plan: Recommend she work on quad strengthening particularly VMO.  She has significant time constraints due to her chemotherapy and radiation therapy.  Therefore she would like to try home exercise program at home.  Discussed knee friendly exercises with her.  Also the discussed and had her perform quad strengthening exercises today.  She will try this for the next 6 weeks to see how she does.  Continue the Voltaren gel as she has had good results with this.  If she develops recurrent effusion or mechanical symptoms she will return sooner we may at that point time obtain an MRI.  However I did discuss with her at this point I do not feel that an MRI is warranted.  The patient was reviewed with Dr. Ninfa Linden this morning.

## 2019-05-20 ENCOUNTER — Inpatient Hospital Stay: Payer: 59 | Attending: Hematology and Oncology

## 2019-05-20 ENCOUNTER — Other Ambulatory Visit: Payer: Self-pay | Admitting: Hematology and Oncology

## 2019-05-20 ENCOUNTER — Encounter: Payer: Self-pay | Admitting: *Deleted

## 2019-05-20 ENCOUNTER — Other Ambulatory Visit: Payer: Self-pay

## 2019-05-20 VITALS — BP 138/52 | HR 73 | Temp 97.8°F | Resp 20

## 2019-05-20 DIAGNOSIS — C50212 Malignant neoplasm of upper-inner quadrant of left female breast: Secondary | ICD-10-CM

## 2019-05-20 DIAGNOSIS — Z171 Estrogen receptor negative status [ER-]: Secondary | ICD-10-CM

## 2019-05-20 DIAGNOSIS — Z5112 Encounter for antineoplastic immunotherapy: Secondary | ICD-10-CM | POA: Diagnosis present

## 2019-05-20 DIAGNOSIS — Z510 Encounter for antineoplastic radiation therapy: Secondary | ICD-10-CM | POA: Diagnosis not present

## 2019-05-20 MED ORDER — SODIUM CHLORIDE 0.9% FLUSH
10.0000 mL | INTRAVENOUS | Status: DC | PRN
  Administered 2019-05-20: 10 mL
  Filled 2019-05-20: qty 10

## 2019-05-20 MED ORDER — SODIUM CHLORIDE 0.9 % IV SOLN
Freq: Once | INTRAVENOUS | Status: AC
  Filled 2019-05-20: qty 250

## 2019-05-20 MED ORDER — ACETAMINOPHEN 325 MG PO TABS
650.0000 mg | ORAL_TABLET | Freq: Once | ORAL | Status: AC
  Administered 2019-05-20: 650 mg via ORAL

## 2019-05-20 MED ORDER — ACETAMINOPHEN 325 MG PO TABS
ORAL_TABLET | ORAL | Status: AC
  Filled 2019-05-20: qty 2

## 2019-05-20 MED ORDER — DIPHENHYDRAMINE HCL 25 MG PO CAPS
25.0000 mg | ORAL_CAPSULE | Freq: Once | ORAL | Status: AC
  Administered 2019-05-20: 25 mg via ORAL

## 2019-05-20 MED ORDER — SODIUM CHLORIDE 0.9 % IV SOLN
420.0000 mg | Freq: Once | INTRAVENOUS | Status: AC
  Administered 2019-05-20: 420 mg via INTRAVENOUS
  Filled 2019-05-20: qty 14

## 2019-05-20 MED ORDER — HEPARIN SOD (PORK) LOCK FLUSH 100 UNIT/ML IV SOLN
500.0000 [IU] | Freq: Once | INTRAVENOUS | Status: AC | PRN
  Administered 2019-05-20: 500 [IU]
  Filled 2019-05-20: qty 5

## 2019-05-20 MED ORDER — TRASTUZUMAB-ANNS CHEMO 150 MG IV SOLR
6.0000 mg/kg | Freq: Once | INTRAVENOUS | Status: AC
  Administered 2019-05-20: 378 mg via INTRAVENOUS
  Filled 2019-05-20: qty 18

## 2019-05-20 MED ORDER — DIPHENHYDRAMINE HCL 25 MG PO CAPS
ORAL_CAPSULE | ORAL | Status: AC
  Filled 2019-05-20: qty 1

## 2019-05-24 ENCOUNTER — Other Ambulatory Visit: Payer: Self-pay

## 2019-05-24 ENCOUNTER — Ambulatory Visit
Admission: RE | Admit: 2019-05-24 | Discharge: 2019-05-24 | Disposition: A | Discharge status: Home / Self Care | Payer: 59 | Source: Ambulatory Visit | Attending: Radiation Oncology | Admitting: Radiation Oncology

## 2019-05-24 DIAGNOSIS — C50212 Malignant neoplasm of upper-inner quadrant of left female breast: Secondary | ICD-10-CM

## 2019-05-24 DIAGNOSIS — Z510 Encounter for antineoplastic radiation therapy: Secondary | ICD-10-CM | POA: Diagnosis not present

## 2019-05-24 DIAGNOSIS — Z171 Estrogen receptor negative status [ER-]: Secondary | ICD-10-CM

## 2019-05-24 NOTE — Progress Notes (Signed)
  Radiation Oncology         [phone] ________________________________  Name: Erin Lawson MRN: [id number]  Date: 05/24/2019  DOB: [birth year]  Simulation Verification Note    ICD-10-CM   1. Malignant neoplasm of upper-inner quadrant of left breast in female, estrogen receptor negative ([place])  C50.212    Z17.1     NARRATIVE: The patient was brought to the treatment unit and placed in the planned treatment position. The clinical setup was verified. Then port films were obtained and uploaded to the radiation oncology medical record software.  The treatment beams were carefully compared against the planned radiation fields. The position location and shape of the radiation fields was reviewed. They targeted volume of tissue appears to be appropriately covered by the radiation beams. Organs at risk appear to be excluded as planned.  Based on my personal review, I approved the simulation verification. The patient's treatment will proceed as planned.  -----------------------------------  Blair Promise, PhD, MD  This document serves as a record of services personally performed by Gery Pray, MD. It was created on his behalf by Clerance Lav, a trained medical scribe. The creation of this record is based on the scribe's personal observations and the provider's statements to them. This document has been checked and approved by the attending provider.

## 2019-05-25 ENCOUNTER — Other Ambulatory Visit: Payer: Self-pay

## 2019-05-25 ENCOUNTER — Ambulatory Visit
Admission: RE | Admit: 2019-05-25 | Discharge: 2019-05-25 | Disposition: A | Discharge status: Home / Self Care | Payer: 59 | Source: Ambulatory Visit | Attending: Radiation Oncology | Admitting: Radiation Oncology

## 2019-05-25 DIAGNOSIS — Z510 Encounter for antineoplastic radiation therapy: Secondary | ICD-10-CM | POA: Diagnosis not present

## 2019-05-26 ENCOUNTER — Other Ambulatory Visit: Payer: Self-pay

## 2019-05-26 ENCOUNTER — Ambulatory Visit
Admission: RE | Admit: 2019-05-26 | Discharge: 2019-05-26 | Disposition: A | Discharge status: Home / Self Care | Payer: 59 | Source: Ambulatory Visit | Attending: Radiation Oncology | Admitting: Radiation Oncology

## 2019-05-26 DIAGNOSIS — Z510 Encounter for antineoplastic radiation therapy: Secondary | ICD-10-CM | POA: Diagnosis not present

## 2019-05-27 ENCOUNTER — Other Ambulatory Visit: Payer: Self-pay

## 2019-05-27 ENCOUNTER — Ambulatory Visit
Admission: RE | Admit: 2019-05-27 | Discharge: 2019-05-27 | Disposition: A | Discharge status: Home / Self Care | Payer: 59 | Source: Ambulatory Visit | Attending: Radiation Oncology | Admitting: Radiation Oncology

## 2019-05-27 DIAGNOSIS — Z510 Encounter for antineoplastic radiation therapy: Secondary | ICD-10-CM | POA: Diagnosis not present

## 2019-05-28 ENCOUNTER — Other Ambulatory Visit: Payer: Self-pay

## 2019-05-28 ENCOUNTER — Ambulatory Visit
Admission: RE | Admit: 2019-05-28 | Discharge: 2019-05-28 | Disposition: A | Discharge status: Home / Self Care | Payer: 59 | Source: Ambulatory Visit | Attending: Radiation Oncology | Admitting: Radiation Oncology

## 2019-05-28 DIAGNOSIS — Z510 Encounter for antineoplastic radiation therapy: Secondary | ICD-10-CM | POA: Diagnosis not present

## 2019-05-31 ENCOUNTER — Ambulatory Visit
Admission: RE | Admit: 2019-05-31 | Discharge: 2019-05-31 | Disposition: A | Discharge status: Home / Self Care | Payer: 59 | Source: Ambulatory Visit | Attending: Radiation Oncology | Admitting: Radiation Oncology

## 2019-05-31 ENCOUNTER — Other Ambulatory Visit: Payer: Self-pay

## 2019-05-31 DIAGNOSIS — Z510 Encounter for antineoplastic radiation therapy: Secondary | ICD-10-CM | POA: Diagnosis not present

## 2019-06-01 ENCOUNTER — Ambulatory Visit
Admission: RE | Admit: 2019-06-01 | Discharge: 2019-06-01 | Disposition: A | Discharge status: Home / Self Care | Payer: 59 | Source: Ambulatory Visit | Attending: Radiation Oncology | Admitting: Radiation Oncology

## 2019-06-01 DIAGNOSIS — Z510 Encounter for antineoplastic radiation therapy: Secondary | ICD-10-CM | POA: Diagnosis not present

## 2019-06-02 ENCOUNTER — Ambulatory Visit
Admission: RE | Admit: 2019-06-02 | Discharge: 2019-06-02 | Disposition: A | Discharge status: Home / Self Care | Payer: 59 | Source: Ambulatory Visit | Attending: Radiation Oncology | Admitting: Radiation Oncology

## 2019-06-02 ENCOUNTER — Other Ambulatory Visit: Payer: Self-pay

## 2019-06-02 DIAGNOSIS — Z510 Encounter for antineoplastic radiation therapy: Secondary | ICD-10-CM | POA: Diagnosis not present

## 2019-06-03 ENCOUNTER — Other Ambulatory Visit: Payer: Self-pay

## 2019-06-03 ENCOUNTER — Ambulatory Visit
Admission: RE | Admit: 2019-06-03 | Discharge: 2019-06-03 | Disposition: A | Discharge status: Home / Self Care | Payer: 59 | Source: Ambulatory Visit | Attending: Radiation Oncology | Admitting: Radiation Oncology

## 2019-06-03 DIAGNOSIS — Z510 Encounter for antineoplastic radiation therapy: Secondary | ICD-10-CM | POA: Diagnosis not present

## 2019-06-04 ENCOUNTER — Other Ambulatory Visit: Payer: Self-pay

## 2019-06-04 ENCOUNTER — Ambulatory Visit
Admission: RE | Admit: 2019-06-04 | Discharge: 2019-06-04 | Disposition: A | Discharge status: Home / Self Care | Payer: 59 | Source: Ambulatory Visit | Attending: Radiation Oncology | Admitting: Radiation Oncology

## 2019-06-04 DIAGNOSIS — Z510 Encounter for antineoplastic radiation therapy: Secondary | ICD-10-CM | POA: Diagnosis not present

## 2019-06-07 ENCOUNTER — Ambulatory Visit
Admission: RE | Admit: 2019-06-07 | Discharge: 2019-06-07 | Disposition: A | Discharge status: Home / Self Care | Payer: 59 | Source: Ambulatory Visit | Attending: Radiation Oncology | Admitting: Radiation Oncology

## 2019-06-07 ENCOUNTER — Other Ambulatory Visit: Payer: Self-pay

## 2019-06-07 DIAGNOSIS — Z510 Encounter for antineoplastic radiation therapy: Secondary | ICD-10-CM | POA: Diagnosis not present

## 2019-06-08 ENCOUNTER — Other Ambulatory Visit: Payer: Self-pay

## 2019-06-08 ENCOUNTER — Ambulatory Visit
Admission: RE | Admit: 2019-06-08 | Discharge: 2019-06-08 | Disposition: A | Discharge status: Home / Self Care | Payer: 59 | Source: Ambulatory Visit | Attending: Radiation Oncology | Admitting: Radiation Oncology

## 2019-06-08 ENCOUNTER — Ambulatory Visit: Payer: 59 | Admitting: Radiation Oncology

## 2019-06-08 DIAGNOSIS — Z510 Encounter for antineoplastic radiation therapy: Secondary | ICD-10-CM | POA: Diagnosis not present

## 2019-06-09 ENCOUNTER — Other Ambulatory Visit: Payer: Self-pay

## 2019-06-09 ENCOUNTER — Ambulatory Visit
Admission: RE | Admit: 2019-06-09 | Discharge: 2019-06-09 | Disposition: A | Discharge status: Home / Self Care | Payer: 59 | Source: Ambulatory Visit | Attending: Radiation Oncology | Admitting: Radiation Oncology

## 2019-06-09 DIAGNOSIS — Z510 Encounter for antineoplastic radiation therapy: Secondary | ICD-10-CM | POA: Diagnosis not present

## 2019-06-10 ENCOUNTER — Other Ambulatory Visit: Payer: 59

## 2019-06-10 ENCOUNTER — Ambulatory Visit
Admission: RE | Admit: 2019-06-10 | Discharge: 2019-06-10 | Disposition: A | Discharge status: Home / Self Care | Payer: 59 | Source: Ambulatory Visit | Attending: Radiation Oncology | Admitting: Radiation Oncology

## 2019-06-10 ENCOUNTER — Ambulatory Visit: Payer: 59 | Admitting: Hematology and Oncology

## 2019-06-10 ENCOUNTER — Other Ambulatory Visit: Payer: Self-pay

## 2019-06-10 ENCOUNTER — Ambulatory Visit: Payer: 59

## 2019-06-10 DIAGNOSIS — Z510 Encounter for antineoplastic radiation therapy: Secondary | ICD-10-CM | POA: Diagnosis not present

## 2019-06-11 ENCOUNTER — Other Ambulatory Visit: Payer: Self-pay

## 2019-06-11 ENCOUNTER — Ambulatory Visit
Admission: RE | Admit: 2019-06-11 | Discharge: 2019-06-11 | Disposition: A | Discharge status: Home / Self Care | Payer: 59 | Source: Ambulatory Visit | Attending: Radiation Oncology | Admitting: Radiation Oncology

## 2019-06-11 DIAGNOSIS — Z510 Encounter for antineoplastic radiation therapy: Secondary | ICD-10-CM | POA: Diagnosis not present

## 2019-06-15 ENCOUNTER — Ambulatory Visit
Admission: RE | Admit: 2019-06-15 | Discharge: 2019-06-15 | Disposition: A | Discharge status: Home / Self Care | Payer: 59 | Source: Ambulatory Visit | Attending: Radiation Oncology | Admitting: Radiation Oncology

## 2019-06-15 ENCOUNTER — Other Ambulatory Visit: Payer: Self-pay | Admitting: *Deleted

## 2019-06-15 ENCOUNTER — Other Ambulatory Visit: Payer: Self-pay

## 2019-06-15 DIAGNOSIS — Z510 Encounter for antineoplastic radiation therapy: Secondary | ICD-10-CM | POA: Diagnosis present

## 2019-06-15 DIAGNOSIS — C50212 Malignant neoplasm of upper-inner quadrant of left female breast: Secondary | ICD-10-CM | POA: Insufficient documentation

## 2019-06-15 DIAGNOSIS — Z171 Estrogen receptor negative status [ER-]: Secondary | ICD-10-CM | POA: Diagnosis present

## 2019-06-15 NOTE — Progress Notes (Signed)
Patient Care Team: Forrest Moron, MD as PCP - General (Internal Medicine) Mauro Kaufmann, RN as Oncology Nurse Navigator Rockwell Germany, RN as Oncology Nurse Navigator Erroll Luna, MD as Consulting Physician (General Surgery) Nicholas Lose, MD as Consulting Physician (Hematology and Oncology) Gery Pray, MD as Consulting Physician (Radiation Oncology)  DIAGNOSIS:    ICD-10-CM   1. Malignant neoplasm of upper-inner quadrant of left breast in female, estrogen receptor negative ([place])  C50.212    Z17.1     SUMMARY OF ONCOLOGIC HISTORY: Oncology History  Malignant neoplasm of upper-inner quadrant of left breast in female, estrogen receptor negative ([place])  10/26/2018 Initial Diagnosis   Patient palpated a left breast mass x 5 weeks. Mammogram showed a 2.8cm mass with calcifications spanning 3.2cm in the left breast and two mildly thickened axillary lymph nodes up to 0.4cm. Biopsy showed IDC, grade 3, HER-2 + (3+), ER/PR -, Ki67 50%, and no evidence of carcinoma in the left axilla.    10/28/2018 Cancer Staging   Staging form: Breast, AJCC 8th Edition - Clinical stage from 10/28/2018: Stage IIA (cT2, cN0, cM0, G2, ER-, PR-, HER2+) - Signed by Nicholas Lose, MD on 10/28/2018   11/12/2018 - 03/25/2019 Chemotherapy   The patient had palonosetron (ALOXI) injection 0.25 mg, 0.25 mg, Intravenous,  Once, 6 of 6 cycles Administration: 0.25 mg (11/12/2018), 0.25 mg (12/03/2018), 0.25 mg (02/10/2019), 0.25 mg (03/05/2019), 0.25 mg (12/23/2018), 0.25 mg (01/18/2019) pegfilgrastim (NEULASTA) injection 6 mg, 6 mg, Subcutaneous, Once, 5 of 5 cycles Administration: 6 mg (12/05/2018), 6 mg (12/25/2018), 6 mg (01/20/2019), 6 mg (02/12/2019), 6 mg (03/08/2019) pegfilgrastim-jmdb (FULPHILA) injection 6 mg, 6 mg, Subcutaneous,  Once, 1 of 1 cycle Administration: 6 mg (11/14/2018) CARBOplatin (PARAPLATIN) 500 mg in sodium chloride 0.9 % 250 mL chemo infusion, 500 mg (101.8 % of original dose 493 mg),  Intravenous,  Once, 6 of 6 cycles Dose modification:   (original dose 493 mg, Cycle 1, Reason: Provider Judgment) Administration: 500 mg (11/12/2018), 420 mg (12/03/2018), 420 mg (02/10/2019), 420 mg (03/05/2019), 420 mg (12/23/2018), 420 mg (01/18/2019) DOCEtaxel (TAXOTERE) 110 mg in sodium chloride 0.9 % 250 mL chemo infusion, 65 mg/m2 = 110 mg (100 % of original dose 65 mg/m2), Intravenous,  Once, 6 of 6 cycles Dose modification: 65 mg/m2 (original dose 65 mg/m2, Cycle 1, Reason: Provider Judgment), 50 mg/m2 (original dose 65 mg/m2, Cycle 2, Reason: Dose not tolerated), 40 mg/m2 (original dose 65 mg/m2, Cycle 5, Reason: Dose not tolerated) Administration: 110 mg (11/12/2018), 80 mg (12/03/2018), 70 mg (02/10/2019), 70 mg (03/05/2019), 70 mg (12/23/2018), 70 mg (01/18/2019) pertuzumab (PERJETA) 420 mg in sodium chloride 0.9 % 250 mL chemo infusion, 420 mg (100 % of original dose 420 mg), Intravenous, Once, 6 of 6 cycles Dose modification: 420 mg (original dose 420 mg, Cycle 1, Reason: Provider Judgment) Administration: 420 mg (11/12/2018), 420 mg (12/03/2018), 420 mg (02/10/2019), 420 mg (03/05/2019), 420 mg (12/23/2018), 420 mg (01/18/2019) fosaprepitant (EMEND) 150 mg, dexamethasone (DECADRON) 12 mg in sodium chloride 0.9 % 145 mL IVPB, , Intravenous,  Once, 6 of 6 cycles Administration:  (11/12/2018),  (12/03/2018),  (02/10/2019),  (03/05/2019),  (12/23/2018),  (01/18/2019) trastuzumab-anns (KANJINTI) 399 mg in sodium chloride 0.9 % 250 mL chemo infusion, 6 mg/kg = 399 mg (100 % of original dose 6 mg/kg), Intravenous,  Once, 5 of 5 cycles Dose modification: 6 mg/kg (original dose 6 mg/kg, Cycle 2, Reason: Other (see comments), Comment: J-code in auth referral is for Kanjinti) Administration: 399 mg (12/03/2018),  399 mg (12/23/2018), 399 mg (01/18/2019), 399 mg (02/10/2019), 399 mg (03/05/2019) trastuzumab-dkst (OGIVRI) 525 mg in sodium chloride 0.9 % 250 mL chemo infusion, 8 mg/kg = 525 mg, Intravenous,  Once, 1 of 1  cycle Administration: 525 mg (11/12/2018)  for chemotherapy treatment.    04/02/2019 -  Chemotherapy   The patient had pertuzumab (PERJETA) 420 mg in sodium chloride 0.9 % 250 mL chemo infusion, 420 mg (100 % of original dose 420 mg), Intravenous, Once, 3 of 13 cycles Dose modification: 420 mg (original dose 420 mg, Cycle 1, Reason: Provider Judgment) Administration: 420 mg (04/02/2019), 420 mg (04/29/2019), 420 mg (05/20/2019) trastuzumab-anns (KANJINTI) 378 mg in sodium chloride 0.9 % 250 mL chemo infusion, 6 mg/kg = 378 mg (100 % of original dose 6 mg/kg), Intravenous,  Once, 2 of 12 cycles Dose modification: 6 mg/kg (original dose 6 mg/kg, Cycle 2, Reason: Other (see comments), Comment: Preferred biosimilar ) Administration: 378 mg (04/29/2019), 378 mg (05/20/2019) trastuzumab-dkst (OGIVRI) 399 mg in sodium chloride 0.9 % 250 mL chemo infusion, 378 mg (100 % of original dose 6 mg/kg), Intravenous,  Once, 1 of 1 cycle Dose modification: 6 mg/kg (original dose 6 mg/kg, Cycle 1, Reason: Provider Judgment) Administration: 399 mg (04/02/2019)  for chemotherapy treatment.    04/21/2019 Surgery   Left lumpectomy ([facility]): multiple foci of residual carcinoma (0.25 cm max), clear margins, 4 left axillary lymph nodes negative.    05/25/2019 -  Radiation Therapy   Adjuvant radiation     CHIEF COMPLIANT: Follow-up to discuss further treatment  INTERVAL HISTORY: Erin Lawson is a 59 y.o. with above-mentioned history of left breast cancerwho completedneoadjuvant chemotherapy, underwent a lumpectomy, and is currently on radiation treatment. She presents to the clinic today to discuss further treatment.   ALLERGIES:  is allergic to atenolol; poractant alfa; pork-derived products; and meloxicam.  MEDICATIONS:  Current Outpatient Medications  Medication Sig Dispense Refill  . amLODipine (NORVASC) 5 MG tablet Take 1 tablet (5 mg total) by mouth every other day. (Patient taking differently: Take 5  mg by mouth every other day. Takes M, W, F) 90 tablet 1  . blood glucose meter kit and supplies KIT Dispense based on patient and insurance preference. Use up to four times daily as directed. (FOR ICD-9 250.00, 250.01). 1 each 0  . blood glucose meter kit and supplies Dispense based on patient and insurance preference. Use up to four times daily as directed. (FOR ICD-10 E10.9, E11.9). 1 each 0  . glucose blood (CONTOUR NEXT TEST) test strip Check sugar once daily  Dx:  DMII controlled non-insulin dependent. 100 each 3  . LANCETS ULTRA FINE MISC Check sugar up one time daily dx: new onset DMII uncontrolled non-insulin 100 each 3  . magic mouthwash w/lidocaine SOLN Take 5 mLs by mouth 3 (three) times daily as needed for mouth pain. 100 mL 0   No current facility-administered medications for this visit.    PHYSICAL EXAMINATION: ECOG PERFORMANCE STATUS: 1 - Symptomatic but completely ambulatory  Vitals:   06/16/19 1254  BP: (!) 168/81  Pulse: 67  Resp: 20  Temp: 98.7 F (37.1 C)  SpO2: 100%   Filed Weights   06/16/19 1254  Weight: 151 lb (68.5 kg)    LABORATORY DATA:  I have reviewed the data as listed CMP Latest Ref Rng & Units 04/29/2019 04/15/2019 03/05/2019  Glucose 70 - 99 mg/dL 138(H) 166(H) 115(H)  BUN 6 - 20 mg/dL _0 Creatinine 0.44 - 1.00  mg/dL 0.78 0.64 0.71  Sodium 135 - 145 mmol/L 142 139 140  Potassium 3.5 - 5.1 mmol/L 3.6 3.9 4.2  Chloride 98 - 111 mmol/L 107 106 106  CO2 22 - 32 mmol/L _0 Calcium 8.9 - 10.3 mg/dL 9.3 9.2 9.4  Total Protein 6.5 - 8.1 g/dL 7.3 6.3(L) 6.7  Total Bilirubin 0.3 - 1.2 mg/dL 0.4 0.6 0.2(L)  Alkaline Phos 38 - 126 U/L 138(H) 121 129(H)  AST 15 - 41 U/L 13(L) 16 14(L)  ALT 0 - 44 U/L _1 Lab Results  Component Value Date   WBC 6.0 06/16/2019   HGB 9.5 (L) 06/16/2019   HCT 29.7 (L) 06/16/2019   MCV 77.3 (L) 06/16/2019   PLT 287 06/16/2019   NEUTROABS 3.6 06/16/2019    ASSESSMENT & PLAN:  Malignant neoplasm of  upper-inner quadrant of left breast in female, estrogen receptor negative ([place]) 10/26/2018:Patient palpated a left breast mass x 5 weeks. Mammogram showed a 2.8cm mass with calcifications spanning 3.2cm in the left breast and two mildly thickened axillary lymph nodes up to 0.4cm. Biopsy showed IDC, grade 3, HER-2 + (3+), ER/PR -, Ki67 50%, and no evidence of carcinoma in the left axilla.  Treatment plan: 1. Neoadjuvant chemotherapy with [place] Perjeta 6 cycles(completed on 03/05/19)followed by HerceptinPerjetamaintenance for 1 year  2. 04/21/2019:Left lumpectomy ([facility]): multiple foci of residual carcinoma (0.25 cm in greatest dimension), clear margins, 4 left axillary lymph nodes negative.  Repeat prognostic panel ER negative PR negative, Ki-67 40%, HER-2 positive 3. Followed by adjuvant radiation therapy ------------------------------------------------------------------------------------------------------------------------------------------------------------- Current treatment: Maintenance Herceptin Perjeta (to be completed October 2021) and adjuvant radiation Toxicities of Herceptin Perjeta: Tolerating it well without any problems or concerns. Radiation dermatitis: Applying ointments.  Return to clinic every 3 weeks for Herceptin Perjeta and every 6 weeks for follow-up with me.    No orders of the defined types were placed in this encounter.  The patient has a good understanding of the overall plan. she agrees with it. she will call with any problems that may develop before the next visit here.  Total time spent: 30 mins including face to face time and time spent for planning, charting and coordination of care  Nicholas Lose, MD 06/16/2019  I, Cloyde Reams Dorshimer, am acting as scribe for Dr. Nicholas Lose.  I have reviewed the above documentation for accuracy and completeness, and I agree with the above.

## 2019-06-16 ENCOUNTER — Inpatient Hospital Stay: Payer: 59

## 2019-06-16 ENCOUNTER — Other Ambulatory Visit: Payer: Self-pay

## 2019-06-16 ENCOUNTER — Inpatient Hospital Stay: Payer: 59 | Attending: Hematology and Oncology

## 2019-06-16 ENCOUNTER — Ambulatory Visit
Admission: RE | Admit: 2019-06-16 | Discharge: 2019-06-16 | Disposition: A | Discharge status: Home / Self Care | Payer: 59 | Source: Ambulatory Visit | Attending: Radiation Oncology | Admitting: Radiation Oncology

## 2019-06-16 ENCOUNTER — Inpatient Hospital Stay (HOSPITAL_BASED_OUTPATIENT_CLINIC_OR_DEPARTMENT_OTHER): Payer: 59 | Admitting: Hematology and Oncology

## 2019-06-16 VITALS — BP 152/76 | HR 66 | Temp 98.5°F | Resp 18

## 2019-06-16 DIAGNOSIS — Z171 Estrogen receptor negative status [ER-]: Secondary | ICD-10-CM

## 2019-06-16 DIAGNOSIS — C50212 Malignant neoplasm of upper-inner quadrant of left female breast: Secondary | ICD-10-CM

## 2019-06-16 DIAGNOSIS — Z5112 Encounter for antineoplastic immunotherapy: Secondary | ICD-10-CM | POA: Diagnosis present

## 2019-06-16 DIAGNOSIS — Z95828 Presence of other vascular implants and grafts: Secondary | ICD-10-CM

## 2019-06-16 LAB — CBC WITH DIFFERENTIAL (CANCER CENTER ONLY)
Abs Immature Granulocytes: 0.05 10*3/uL (ref 0.00–0.07)
Basophils Absolute: 0 10*3/uL (ref 0.0–0.1)
Basophils Relative: 0 %
Eosinophils Absolute: 0.1 10*3/uL (ref 0.0–0.5)
Eosinophils Relative: 2 %
HCT: 29.7 % — ABNORMAL LOW (ref 36.0–46.0)
Hemoglobin: 9.5 g/dL — ABNORMAL LOW (ref 12.0–15.0)
Immature Granulocytes: 1 %
Lymphocytes Relative: 27 %
Lymphs Abs: 1.6 10*3/uL (ref 0.7–4.0)
MCH: 24.7 pg — ABNORMAL LOW (ref 26.0–34.0)
MCHC: 32 g/dL (ref 30.0–36.0)
MCV: 77.3 fL — ABNORMAL LOW (ref 80.0–100.0)
Monocytes Absolute: 0.6 10*3/uL (ref 0.1–1.0)
Monocytes Relative: 10 %
Neutro Abs: 3.6 10*3/uL (ref 1.7–7.7)
Neutrophils Relative %: 60 %
Platelet Count: 287 10*3/uL (ref 150–400)
RBC: 3.84 MIL/uL — ABNORMAL LOW (ref 3.87–5.11)
RDW: 13.9 % (ref 11.5–15.5)
WBC Count: 6 10*3/uL (ref 4.0–10.5)
nRBC: 0 % (ref 0.0–0.2)

## 2019-06-16 LAB — CMP (CANCER CENTER ONLY)
ALT: 17 U/L (ref 0–44)
AST: 17 U/L (ref 15–41)
Albumin: 4 g/dL (ref 3.5–5.0)
Alkaline Phosphatase: 138 U/L — ABNORMAL HIGH (ref 38–126)
Anion gap: 11 (ref 5–15)
BUN: 15 mg/dL (ref 6–20)
CO2: 23 mmol/L (ref 22–32)
Calcium: 9.4 mg/dL (ref 8.9–10.3)
Chloride: 108 mmol/L (ref 98–111)
Creatinine: 0.75 mg/dL (ref 0.44–1.00)
GFR, Est AFR Am: >60 mL/min (ref >60)
GFR, Estimated: >60 mL/min (ref >60)
Glucose, Bld: 125 mg/dL — ABNORMAL HIGH (ref 70–99)
Potassium: 3.6 mmol/L (ref 3.5–5.1)
Sodium: 142 mmol/L (ref 135–145)
Total Bilirubin: 0.4 mg/dL (ref 0.3–1.2)
Total Protein: 6.9 g/dL (ref 6.5–8.1)

## 2019-06-16 MED ORDER — ACETAMINOPHEN 325 MG PO TABS
ORAL_TABLET | ORAL | Status: AC
  Filled 2019-06-16: qty 2

## 2019-06-16 MED ORDER — DIPHENHYDRAMINE HCL 25 MG PO CAPS
25.0000 mg | ORAL_CAPSULE | Freq: Once | ORAL | Status: AC
  Administered 2019-06-16: 25 mg via ORAL

## 2019-06-16 MED ORDER — ACETAMINOPHEN 325 MG PO TABS
650.0000 mg | ORAL_TABLET | Freq: Once | ORAL | Status: AC
  Administered 2019-06-16: 650 mg via ORAL

## 2019-06-16 MED ORDER — SODIUM CHLORIDE 0.9% FLUSH
10.0000 mL | INTRAVENOUS | Status: DC | PRN
  Administered 2019-06-16: 10 mL
  Filled 2019-06-16: qty 10

## 2019-06-16 MED ORDER — DIPHENHYDRAMINE HCL 25 MG PO CAPS
ORAL_CAPSULE | ORAL | Status: AC
  Filled 2019-06-16: qty 1

## 2019-06-16 MED ORDER — SODIUM CHLORIDE 0.9 % IV SOLN
420.0000 mg | Freq: Once | INTRAVENOUS | Status: AC
  Administered 2019-06-16: 420 mg via INTRAVENOUS
  Filled 2019-06-16: qty 14

## 2019-06-16 MED ORDER — HEPARIN SOD (PORK) LOCK FLUSH 100 UNIT/ML IV SOLN
500.0000 [IU] | Freq: Once | INTRAVENOUS | Status: AC | PRN
  Administered 2019-06-16: 500 [IU]
  Filled 2019-06-16: qty 5

## 2019-06-16 MED ORDER — TRASTUZUMAB-ANNS CHEMO 150 MG IV SOLR
6.0000 mg/kg | Freq: Once | INTRAVENOUS | Status: AC
  Administered 2019-06-16: 378 mg via INTRAVENOUS
  Filled 2019-06-16: qty 18

## 2019-06-16 MED ORDER — SODIUM CHLORIDE 0.9 % IV SOLN
Freq: Once | INTRAVENOUS | Status: AC
  Filled 2019-06-16: qty 250

## 2019-06-16 NOTE — Patient Instructions (Signed)

## 2019-06-16 NOTE — Assessment & Plan Note (Signed)
10/26/2018:Patient palpated a left breast mass x 5 weeks. Mammogram showed a 2.8cm mass with calcifications spanning 3.2cm in the left breast and two mildly thickened axillary lymph nodes up to 0.4cm. Biopsy showed IDC, grade 3, HER-2 + (3+), ER/PR -, Ki67 50%, and no evidence of carcinoma in the left axilla.  Treatment plan: 1. Neoadjuvant chemotherapy with [place] Perjeta 6 cycles(completed on 03/05/19)followed by HerceptinPerjetamaintenance for 1 year  2. 04/21/2019:Left lumpectomy ([facility]): multiple foci of residual carcinoma (0.25 cm in greatest dimension), clear margins, 4 left axillary lymph nodes negative.  Repeat prognostic panel ER negative PR negative, Ki-67 40%, HER-2 positive 3. Followed by adjuvant radiation therapy ------------------------------------------------------------------------------------------------------------------------------------------------------------- Current treatment: Maintenance Herceptin Perjeta and adjuvant radiation Toxicities of Herceptin Perjeta:  Return to clinic every 3 weeks for Herceptin Perjeta and every 6 weeks for follow-up with me.

## 2019-06-16 NOTE — Patient Instructions (Signed)
COVID-19 Vaccine Information can be found at: [email or web address] For questions related to vaccine distribution or appointments, please email [email or web address] or call [phone].   [facility] Discharge Instructions for Patients Receiving Immunotherapy  Today you received the following immunotherpay agents: Transtuzumab-anns (Kanjinti) and Pertuzumab (Perjeta)  To help prevent nausea and vomiting after your treatment, we encourage you to take your nausea medication as directed by your provider.   If you develop nausea and vomiting that is not controlled by your nausea medication, call the clinic.   BELOW ARE SYMPTOMS THAT SHOULD BE REPORTED IMMEDIATELY:  *FEVER GREATER THAN 100.5 F  *CHILLS WITH OR WITHOUT FEVER  NAUSEA AND VOMITING THAT IS NOT CONTROLLED WITH YOUR NAUSEA MEDICATION  *UNUSUAL SHORTNESS OF BREATH  *UNUSUAL BRUISING OR BLEEDING  TENDERNESS IN MOUTH AND THROAT WITH OR WITHOUT PRESENCE OF ULCERS  *URINARY PROBLEMS  *BOWEL PROBLEMS  UNUSUAL RASH Items with * indicate a potential emergency and should be followed up as soon as possible.  Feel free to call the clinic should you have any questions or concerns. The clinic phone number is [phone].  Please show the CHEMO ALERT CARD at check-in to the Emergency Department and triage nurse.  Coronavirus (COVID-19) Are you at risk?  Are you at risk for the Coronavirus (COVID-19)?  To be considered HIGH RISK for Coronavirus (COVID-19), you have to meet the following criteria:  . Traveled to [place], [place], [name], [place] or [place]; or in the [place] to [place], [place], [place], or [place]; and have fever, cough, and shortness of breath within the last 2 weeks of travel OR . Been in close contact with a person diagnosed with COVID-19 within the last 2 weeks and have fever, cough, and shortness of breath . IF YOU DO NOT  MEET THESE CRITERIA, YOU ARE CONSIDERED LOW RISK FOR COVID-19.  What to do if you are HIGH RISK for COVID-19?  . If you are having a medical emergency, call 911. . Seek medical care right away. Before you go to a doctor's office, urgent care or emergency department, call ahead and tell them about your recent travel, contact with someone diagnosed with COVID-19, and your symptoms. You should receive instructions from your physician's office regarding next steps of care.  . When you arrive at healthcare provider, tell the healthcare staff immediately you have returned from visiting [place], [place], [place], [place] or [name]; or traveled in the [place] to [place], [place], [place], or [place]; in the last two weeks or you have been in close contact with a person diagnosed with COVID-19 in the last 2 weeks.   . Tell the health care staff about your symptoms: fever, cough and shortness of breath. . After you have been seen by a medical provider, you will be either: o Tested for (COVID-19) and discharged home on quarantine except to seek medical care if symptoms worsen, and asked to  - Stay home and avoid contact with others until you get your results (4-5 days)  - Avoid travel on public transportation if possible (such as bus, train, or airplane) or o Sent to the Emergency Department by EMS for evaluation, COVID-19 testing, and possible admission depending on your condition and test results.  What to do if you are LOW RISK for COVID-19?  Reduce your risk of any infection by using the same precautions used for avoiding the common cold or flu:  . Wash your hands often with soap and warm   water for at least 20 seconds.  If soap and water are not readily available, use an alcohol-based hand sanitizer with at least 60% alcohol.  . If coughing or sneezing, cover your mouth and nose by coughing or sneezing into the elbow areas of your shirt or coat, into a tissue or into your sleeve (not your  hands). . Avoid shaking hands with others and consider head nods or verbal greetings only. . Avoid touching your eyes, nose, or mouth with unwashed hands.  . Avoid close contact with people who are sick. . Avoid places or events with large numbers of people in one location, like concerts or sporting events. . Carefully consider travel plans you have or are making. . If you are planning any travel outside or inside the [name], visit the CDC's Travelers' Health webpage for the latest health notices. . If you have some symptoms but not all symptoms, continue to monitor at home and seek medical attention if your symptoms worsen. . If you are having a medical emergency, call 911.   [place] / e-Visit: [email or web address]         [facility]: [facility]: [phone]                   [facility]: [phone]

## 2019-06-17 ENCOUNTER — Ambulatory Visit
Admission: RE | Admit: 2019-06-17 | Discharge: 2019-06-17 | Disposition: A | Discharge status: Home / Self Care | Payer: 59 | Source: Ambulatory Visit | Attending: Radiation Oncology | Admitting: Radiation Oncology

## 2019-06-17 ENCOUNTER — Other Ambulatory Visit: Payer: Self-pay

## 2019-06-17 DIAGNOSIS — C50212 Malignant neoplasm of upper-inner quadrant of left female breast: Secondary | ICD-10-CM | POA: Diagnosis not present

## 2019-06-18 ENCOUNTER — Ambulatory Visit
Admission: RE | Admit: 2019-06-18 | Discharge: 2019-06-18 | Disposition: A | Discharge status: Home / Self Care | Payer: 59 | Source: Ambulatory Visit | Attending: Radiation Oncology | Admitting: Radiation Oncology

## 2019-06-18 ENCOUNTER — Other Ambulatory Visit: Payer: Self-pay

## 2019-06-18 DIAGNOSIS — C50212 Malignant neoplasm of upper-inner quadrant of left female breast: Secondary | ICD-10-CM | POA: Diagnosis not present

## 2019-06-21 ENCOUNTER — Ambulatory Visit
Admission: RE | Admit: 2019-06-21 | Discharge: 2019-06-21 | Disposition: A | Discharge status: Home / Self Care | Payer: 59 | Source: Ambulatory Visit | Attending: Radiation Oncology | Admitting: Radiation Oncology

## 2019-06-21 ENCOUNTER — Other Ambulatory Visit: Payer: Self-pay

## 2019-06-21 DIAGNOSIS — C50212 Malignant neoplasm of upper-inner quadrant of left female breast: Secondary | ICD-10-CM | POA: Diagnosis not present

## 2019-06-22 ENCOUNTER — Ambulatory Visit
Admission: RE | Admit: 2019-06-22 | Discharge: 2019-06-22 | Disposition: A | Discharge status: Home / Self Care | Payer: 59 | Source: Ambulatory Visit | Attending: Radiation Oncology | Admitting: Radiation Oncology

## 2019-06-22 ENCOUNTER — Encounter: Payer: Self-pay | Admitting: *Deleted

## 2019-06-22 ENCOUNTER — Encounter: Payer: Self-pay | Admitting: Radiation Oncology

## 2019-06-22 ENCOUNTER — Other Ambulatory Visit: Payer: Self-pay

## 2019-06-22 DIAGNOSIS — C50212 Malignant neoplasm of upper-inner quadrant of left female breast: Secondary | ICD-10-CM | POA: Diagnosis not present

## 2019-06-22 DIAGNOSIS — Z171 Estrogen receptor negative status [ER-]: Secondary | ICD-10-CM

## 2019-06-22 MED ORDER — SONAFINE EX EMUL
1.0000 "application " | Freq: Two times a day (BID) | CUTANEOUS | Status: DC
  Administered 2019-06-22: 1 via TOPICAL

## 2019-07-01 ENCOUNTER — Ambulatory Visit: Payer: 59

## 2019-07-01 ENCOUNTER — Telehealth: Payer: Self-pay | Admitting: Hematology and Oncology

## 2019-07-01 NOTE — Telephone Encounter (Signed)
Scheduled per 06/02 los, patient has been called and notified.

## 2019-07-05 ENCOUNTER — Other Ambulatory Visit: Payer: Self-pay

## 2019-07-05 ENCOUNTER — Ambulatory Visit: Payer: 59 | Admitting: Orthopaedic Surgery

## 2019-07-05 ENCOUNTER — Inpatient Hospital Stay: Payer: 59

## 2019-07-05 VITALS — BP 143/67 | HR 80 | Temp 98.1°F | Resp 18

## 2019-07-05 DIAGNOSIS — C50212 Malignant neoplasm of upper-inner quadrant of left female breast: Secondary | ICD-10-CM

## 2019-07-05 DIAGNOSIS — Z5112 Encounter for antineoplastic immunotherapy: Secondary | ICD-10-CM | POA: Diagnosis not present

## 2019-07-05 MED ORDER — DIPHENHYDRAMINE HCL 25 MG PO CAPS
25.0000 mg | ORAL_CAPSULE | Freq: Once | ORAL | Status: AC
  Administered 2019-07-05: 25 mg via ORAL

## 2019-07-05 MED ORDER — HEPARIN SOD (PORK) LOCK FLUSH 100 UNIT/ML IV SOLN
500.0000 [IU] | Freq: Once | INTRAVENOUS | Status: AC | PRN
  Administered 2019-07-05: 500 [IU]
  Filled 2019-07-05: qty 5

## 2019-07-05 MED ORDER — SODIUM CHLORIDE 0.9% FLUSH
10.0000 mL | INTRAVENOUS | Status: DC | PRN
  Administered 2019-07-05: 10 mL
  Filled 2019-07-05: qty 10

## 2019-07-05 MED ORDER — ACETAMINOPHEN 325 MG PO TABS
650.0000 mg | ORAL_TABLET | Freq: Once | ORAL | Status: AC
  Administered 2019-07-05: 650 mg via ORAL

## 2019-07-05 MED ORDER — SODIUM CHLORIDE 0.9 % IV SOLN
Freq: Once | INTRAVENOUS | Status: AC
  Filled 2019-07-05: qty 250

## 2019-07-05 MED ORDER — SODIUM CHLORIDE 0.9 % IV SOLN
420.0000 mg | Freq: Once | INTRAVENOUS | Status: AC
  Administered 2019-07-05: 420 mg via INTRAVENOUS
  Filled 2019-07-05: qty 14

## 2019-07-05 MED ORDER — TRASTUZUMAB-ANNS CHEMO 150 MG IV SOLR
6.0000 mg/kg | Freq: Once | INTRAVENOUS | Status: AC
  Administered 2019-07-05: 378 mg via INTRAVENOUS
  Filled 2019-07-05: qty 18

## 2019-07-05 NOTE — Patient Instructions (Signed)
COVID-19 Vaccine Information can be found at: [email or web address] For questions related to vaccine distribution or appointments, please email [email or web address] or call [phone].   [facility] Discharge Instructions for Patients Receiving Immunotherapy  Today you received the following immunotherpay agents: Transtuzumab-anns (Kanjinti) and Pertuzumab (Perjeta)  To help prevent nausea and vomiting after your treatment, we encourage you to take your nausea medication as directed by your provider.   If you develop nausea and vomiting that is not controlled by your nausea medication, call the clinic.   BELOW ARE SYMPTOMS THAT SHOULD BE REPORTED IMMEDIATELY:  *FEVER GREATER THAN 100.5 F  *CHILLS WITH OR WITHOUT FEVER  NAUSEA AND VOMITING THAT IS NOT CONTROLLED WITH YOUR NAUSEA MEDICATION  *UNUSUAL SHORTNESS OF BREATH  *UNUSUAL BRUISING OR BLEEDING  TENDERNESS IN MOUTH AND THROAT WITH OR WITHOUT PRESENCE OF ULCERS  *URINARY PROBLEMS  *BOWEL PROBLEMS  UNUSUAL RASH Items with * indicate a potential emergency and should be followed up as soon as possible.  Feel free to call the clinic should you have any questions or concerns. The clinic phone number is [phone].  Please show the [place] at check-in to the Emergency Department and triage nurse.  Coronavirus (COVID-19) Are you at risk?  Are you at risk for the Coronavirus (COVID-19)?  To be considered HIGH RISK for Coronavirus (COVID-19), you have to meet the following criteria:  . Traveled to [place], [place], [place], [place] or [place]; or in the [place] to [place], [place], [place], or [place]; and have fever, cough, and shortness of breath within the last 2 weeks of travel OR . Been in close contact with a person diagnosed with COVID-19 within the last 2 weeks and have fever, cough, and shortness of breath . IF YOU DO NOT  MEET THESE CRITERIA, YOU ARE CONSIDERED LOW RISK FOR COVID-19.  What to do if you are HIGH RISK for COVID-19?  Marland Kitchen If you are having a medical emergency, call 911. . Seek medical care right away. Before you go to a doctor's office, urgent care or emergency department, call ahead and tell them about your recent travel, contact with someone diagnosed with COVID-19, and your symptoms. You should receive instructions from your physician's office regarding next steps of care.  . When you arrive at healthcare provider, tell the healthcare staff immediately you have returned from visiting [place], [place], [place], [place] or [place]; or traveled in the [place] to [place], [place], [place], or [place]; in the last two weeks or you have been in close contact with a person diagnosed with COVID-19 in the last 2 weeks.   . Tell the health care staff about your symptoms: fever, cough and shortness of breath. . After you have been seen by a medical provider, you will be either: o Tested for (COVID-19) and discharged home on quarantine except to seek medical care if symptoms worsen, and asked to  - Stay home and avoid contact with others until you get your results (4-5 days)  - Avoid travel on public transportation if possible (such as bus, train, or airplane) or o Sent to the Emergency Department by EMS for evaluation, COVID-19 testing, and possible admission depending on your condition and test results.  What to do if you are LOW RISK for COVID-19?  Reduce your risk of any infection by using the same precautions used for avoiding the common cold or flu:  Marland Kitchen Wash your hands often with soap and warm  water for at least 20 seconds.  If soap and water are not readily available, use an alcohol-based hand sanitizer with at least 60% alcohol.  . If coughing or sneezing, cover your mouth and nose by coughing or sneezing into the elbow areas of your shirt or coat, into a tissue or into your sleeve (not your  hands). . Avoid shaking hands with others and consider head nods or verbal greetings only. . Avoid touching your eyes, nose, or mouth with unwashed hands.  . Avoid close contact with people who are sick. . Avoid places or events with large numbers of people in one location, like concerts or sporting events. . Carefully consider travel plans you have or are making. . If you are planning any travel outside or inside the [name], visit the CDC's Travelers' Health webpage for the latest health notices. . If you have some symptoms but not all symptoms, continue to monitor at home and seek medical attention if your symptoms worsen. . If you are having a medical emergency, call 911.   [place] / e-Visit: [email or web address]         [facility]: [facility]: [phone]                   [facility]: [phone]

## 2019-07-26 ENCOUNTER — Encounter: Payer: Self-pay | Admitting: *Deleted

## 2019-07-26 ENCOUNTER — Ambulatory Visit: Payer: 59 | Admitting: Hematology and Oncology

## 2019-07-26 ENCOUNTER — Ambulatory Visit: Payer: Self-pay | Admitting: Radiation Oncology

## 2019-07-26 ENCOUNTER — Ambulatory Visit: Payer: 59

## 2019-07-26 ENCOUNTER — Other Ambulatory Visit: Payer: 59

## 2019-07-26 NOTE — Progress Notes (Incomplete)
  Patient Name: Erin Lawson MRN: [id number] DOB: 06-Feb-1960 Referring Physician: Nicholas Lose (Profile Not Attached) Date of Service: 06/22/2019 [facility] [place]                                                        End Of Treatment Note  Diagnoses: C50.212-Malignant neoplasm of upper-inner quadrant of left female breast  Cancer Staging: StagepT1a, pN0 LeftBreast UIQ,Invasive DuctalCarcinoma with DCIS, ER-/ PR-/ Her2+, Grade3  Intent: Curative  Radiation Treatment Dates: 05/24/2019 through 06/22/2019 Site Technique Total Dose (Gy) Dose per Fx (Gy) Completed Fx Beam Energies  Breast, Left: Breast_Lt 3D 40.05/40.05 2.67 15/15 6X  Breast, Left: Breast_Lt_Bst 3D 12/12 2 6/6 6X   Narrative: The patient tolerated radiation therapy relatively well. She did report some burning and itching under the left arm after her first treatment. There was no significant reaction or secondary infection noted along the left breast or axillary region at that time. As treatment continued, she began to report some left breast swelling/heaviness and tingling in legs when in the shower. On examination, the left breast area did show some mild hyperpigmentation changes with mild swelling but there were no signs of infection. She began to develop some swelling/induration near the lumpectomy site as well as some swelling in the left axillary area, but there were no signs of infection or skin breakdown. On 06/15/2019, the patient reported ongoing fatigue and burning/redness under her left arm. She was noted to have erythema and impending moist desquamation in the inframammary fold for which she was advised to obtain some Neosporin Plus. Moist desquamation improved by the end of treatment.  Plan: The patient will follow-up with radiation oncology  in one month.  ________________________________________________   Blair Promise, PhD, MD  This document serves as a record of services personally performed by Gery Pray, MD. It was created on his behalf by Clerance Lav, a trained medical scribe. The creation of this record is based on the scribe's personal observations and the provider's statements to them. This document has been checked and approved by the attending provider.

## 2019-07-27 NOTE — Progress Notes (Signed)
Patient Care Team: Forrest Moron, MD as PCP - General (Internal Medicine) Mauro Kaufmann, RN as Oncology Nurse Navigator Rockwell Germany, RN as Oncology Nurse Navigator Erroll Luna, MD as Consulting Physician (General Surgery) Nicholas Lose, MD as Consulting Physician (Hematology and Oncology) Gery Pray, MD as Consulting Physician (Radiation Oncology)  DIAGNOSIS:    ICD-10-CM   1. Malignant neoplasm of upper-inner quadrant of left breast in female, estrogen receptor negative ([place])  C50.212    Z17.1     SUMMARY OF ONCOLOGIC HISTORY: Oncology History  Malignant neoplasm of upper-inner quadrant of left breast in female, estrogen receptor negative ([place])  10/26/2018 Initial Diagnosis   Patient palpated a left breast mass x 5 weeks. Mammogram showed a 2.8cm mass with calcifications spanning 3.2cm in the left breast and two mildly thickened axillary lymph nodes up to 0.4cm. Biopsy showed IDC, grade 3, HER-2 + (3+), ER/PR -, Ki67 50%, and no evidence of carcinoma in the left axilla.    10/28/2018 Cancer Staging   Staging form: Breast, AJCC 8th Edition - Clinical stage from 10/28/2018: Stage IIA (cT2, cN0, cM0, G2, ER-, PR-, HER2+) - Signed by Nicholas Lose, MD on 10/28/2018   11/12/2018 - 03/25/2019 Chemotherapy   The patient had palonosetron (ALOXI) injection 0.25 mg, 0.25 mg, Intravenous,  Once, 6 of 6 cycles Administration: 0.25 mg (11/12/2018), 0.25 mg (12/03/2018), 0.25 mg (02/10/2019), 0.25 mg (03/05/2019), 0.25 mg (12/23/2018), 0.25 mg (01/18/2019) pegfilgrastim (NEULASTA) injection 6 mg, 6 mg, Subcutaneous, Once, 5 of 5 cycles Administration: 6 mg (12/05/2018), 6 mg (12/25/2018), 6 mg (01/20/2019), 6 mg (02/12/2019), 6 mg (03/08/2019) pegfilgrastim-jmdb (FULPHILA) injection 6 mg, 6 mg, Subcutaneous,  Once, 1 of 1 cycle Administration: 6 mg (11/14/2018) CARBOplatin (PARAPLATIN) 500 mg in sodium chloride 0.9 % 250 mL chemo infusion, 500 mg (101.8 % of original dose 493 mg),  Intravenous,  Once, 6 of 6 cycles Dose modification:   (original dose 493 mg, Cycle 1, Reason: Provider Judgment) Administration: 500 mg (11/12/2018), 420 mg (12/03/2018), 420 mg (02/10/2019), 420 mg (03/05/2019), 420 mg (12/23/2018), 420 mg (01/18/2019) DOCEtaxel (TAXOTERE) 110 mg in sodium chloride 0.9 % 250 mL chemo infusion, 65 mg/m2 = 110 mg (100 % of original dose 65 mg/m2), Intravenous,  Once, 6 of 6 cycles Dose modification: 65 mg/m2 (original dose 65 mg/m2, Cycle 1, Reason: Provider Judgment), 50 mg/m2 (original dose 65 mg/m2, Cycle 2, Reason: Dose not tolerated), 40 mg/m2 (original dose 65 mg/m2, Cycle 5, Reason: Dose not tolerated) Administration: 110 mg (11/12/2018), 80 mg (12/03/2018), 70 mg (02/10/2019), 70 mg (03/05/2019), 70 mg (12/23/2018), 70 mg (01/18/2019) pertuzumab (PERJETA) 420 mg in sodium chloride 0.9 % 250 mL chemo infusion, 420 mg (100 % of original dose 420 mg), Intravenous, Once, 6 of 6 cycles Dose modification: 420 mg (original dose 420 mg, Cycle 1, Reason: Provider Judgment) Administration: 420 mg (11/12/2018), 420 mg (12/03/2018), 420 mg (02/10/2019), 420 mg (03/05/2019), 420 mg (12/23/2018), 420 mg (01/18/2019) fosaprepitant (EMEND) 150 mg, dexamethasone (DECADRON) 12 mg in sodium chloride 0.9 % 145 mL IVPB, , Intravenous,  Once, 6 of 6 cycles Administration:  (11/12/2018),  (12/03/2018),  (02/10/2019),  (03/05/2019),  (12/23/2018),  (01/18/2019) trastuzumab-anns (KANJINTI) 399 mg in sodium chloride 0.9 % 250 mL chemo infusion, 6 mg/kg = 399 mg (100 % of original dose 6 mg/kg), Intravenous,  Once, 5 of 5 cycles Dose modification: 6 mg/kg (original dose 6 mg/kg, Cycle 2, Reason: Other (see comments), Comment: J-code in auth referral is for Kanjinti) Administration: 399 mg (12/03/2018),  399 mg (12/23/2018), 399 mg (01/18/2019), 399 mg (02/10/2019), 399 mg (03/05/2019) trastuzumab-dkst (OGIVRI) 525 mg in sodium chloride 0.9 % 250 mL chemo infusion, 8 mg/kg = 525 mg, Intravenous,  Once, 1 of 1  cycle Administration: 525 mg (11/12/2018)  for chemotherapy treatment.    04/02/2019 -  Chemotherapy   The patient had pertuzumab (PERJETA) 420 mg in sodium chloride 0.9 % 250 mL chemo infusion, 420 mg (100 % of original dose 420 mg), Intravenous, Once, 5 of 11 cycles Dose modification: 420 mg (original dose 420 mg, Cycle 1, Reason: Provider Judgment) Administration: 420 mg (04/02/2019), 420 mg (04/29/2019), 420 mg (07/05/2019), 420 mg (05/20/2019), 420 mg (06/16/2019) trastuzumab-anns (KANJINTI) 378 mg in sodium chloride 0.9 % 250 mL chemo infusion, 6 mg/kg = 378 mg (100 % of original dose 6 mg/kg), Intravenous,  Once, 4 of 10 cycles Dose modification: 6 mg/kg (original dose 6 mg/kg, Cycle 2, Reason: Other (see comments), Comment: Preferred biosimilar ) Administration: 378 mg (04/29/2019), 378 mg (05/20/2019), 378 mg (06/16/2019), 378 mg (07/05/2019) trastuzumab-dkst (OGIVRI) 399 mg in sodium chloride 0.9 % 250 mL chemo infusion, 378 mg (100 % of original dose 6 mg/kg), Intravenous,  Once, 1 of 1 cycle Dose modification: 6 mg/kg (original dose 6 mg/kg, Cycle 1, Reason: Provider Judgment) Administration: 399 mg (04/02/2019)  for chemotherapy treatment.    04/21/2019 Surgery   Left lumpectomy ([facility]): multiple foci of residual carcinoma (0.25 cm max), clear margins, 4 left axillary lymph nodes negative.    05/25/2019 - 06/22/2019 Radiation Therapy   Adjuvant radiation     CHIEF COMPLIANT: Herceptin Perjeta maintenance  INTERVAL HISTORY: Erin Lawson is a 59 y.o. with above-mentioned history of left breast cancerwho completedneoadjuvant chemotherapy, underwent a lumpectomy, radiation, and is currently on Herceptin Perjeta maintenance. She presents to the clinic todayfor treatment. She appears to be tolerating the treatment extremely well without any problems or concerns.  She has recovered from the effects of radiation.  ALLERGIES:  is allergic to atenolol, poractant alfa, pork-derived products,  and meloxicam.  MEDICATIONS:  Current Outpatient Medications  Medication Sig Dispense Refill  . amLODipine (NORVASC) 5 MG tablet Take 1 tablet (5 mg total) by mouth every other day. (Patient taking differently: Take 5 mg by mouth every other day. Takes M, W, F) 90 tablet 1  . blood glucose meter kit and supplies KIT Dispense based on patient and insurance preference. Use up to four times daily as directed. (FOR ICD-9 250.00, 250.01). 1 each 0  . blood glucose meter kit and supplies Dispense based on patient and insurance preference. Use up to four times daily as directed. (FOR ICD-10 E10.9, E11.9). 1 each 0  . glucose blood (CONTOUR NEXT TEST) test strip Check sugar once daily  Dx:  DMII controlled non-insulin dependent. 100 each 3  . LANCETS ULTRA FINE MISC Check sugar up one time daily dx: new onset DMII uncontrolled non-insulin 100 each 3  . magic mouthwash w/lidocaine SOLN Take 5 mLs by mouth 3 (three) times daily as needed for mouth pain. 100 mL 0   No current facility-administered medications for this visit.    PHYSICAL EXAMINATION: ECOG PERFORMANCE STATUS: 1 - Symptomatic but completely ambulatory  There were no vitals filed for this visit. There were no vitals filed for this visit.  LABORATORY DATA:  I have reviewed the data as listed CMP Latest Ref Rng & Units 06/16/2019 04/29/2019 04/15/2019  Glucose 70 - 99 mg/dL 125(H) 138(H) 166(H)  BUN 6 - 20 mg/dL 15  16 15  Creatinine 0.44 - 1.00 mg/dL 0.75 0.78 0.64  Sodium 135 - 145 mmol/L 142 142 139  Potassium 3.5 - 5.1 mmol/L 3.6 3.6 3.9  Chloride 98 - 111 mmol/L 108 107 106  CO2 22 - 32 mmol/L _0 Calcium 8.9 - 10.3 mg/dL 9.4 9.3 9.2  Total Protein 6.5 - 8.1 g/dL 6.9 7.3 6.3(L)  Total Bilirubin 0.3 - 1.2 mg/dL 0.4 0.4 0.6  Alkaline Phos 38 - 126 U/L 138(H) 138(H) 121  AST 15 - 41 U/L 17 13(L) 16  ALT 0 - 44 U/L _1 Lab Results  Component Value Date   WBC 6.0 06/16/2019   HGB 9.5 (L) 06/16/2019   HCT 29.7 (L)  06/16/2019   MCV 77.3 (L) 06/16/2019   PLT 287 06/16/2019   NEUTROABS 3.6 06/16/2019    ASSESSMENT & PLAN:  Malignant neoplasm of upper-inner quadrant of left breast in female, estrogen receptor negative ([place]) 10/26/2018:Patient palpated a left breast mass x 5 weeks. Mammogram showed a 2.8cm mass with calcifications spanning 3.2cm in the left breast and two mildly thickened axillary lymph nodes up to 0.4cm. Biopsy showed IDC, grade 3, HER-2 + (3+), ER/PR -, Ki67 50%, and no evidence of carcinoma in the left axilla.  Treatment plan: 1. Neoadjuvant chemotherapy with [place] Perjeta 6 cycles(completed on 03/05/19)followed by HerceptinPerjetamaintenance for 1 year  2. 04/21/2019:Left lumpectomy ([facility]): multiple foci of residual carcinoma(0.25 cm in greatest dimension), clear margins, 4 left axillary lymph nodes negative.Repeat prognostic panel ER negative PR negative, Ki-67 40%, HER-2 positive 3. Followed by adjuvant radiation therapy ------------------------------------------------------------------------------------------------------------------------------------------------------------- Current treatment: Maintenance Herceptin Perjeta (to be completed October 2021) and adjuvant radiation Toxicities of Herceptin Perjeta: Tolerating it well without any problems or concerns. Radiation dermatitis: Markedly better.  Patient continues to work full-time in [employer] system.  They have not been of any help with accommodating her doing chemotherapy.  She is very frustrated with her current employer. Return to clinic every 3 weeks for Herceptin Perjeta and every 6 weeks for follow-up with me.    No orders of the defined types were placed in this encounter.  The patient has a good understanding of the overall plan. she agrees with it. she will call with any problems that may develop before the next visit here.  Total time spent: 30 mins including face to face time and time spent for  planning, charting and coordination of care  Nicholas Lose, MD 07/28/2019  I, Cloyde Reams Dorshimer, am acting as scribe for Dr. Nicholas Lose.  I have reviewed the above documentation for accuracy and completeness, and I agree with the above.

## 2019-07-28 ENCOUNTER — Inpatient Hospital Stay (HOSPITAL_BASED_OUTPATIENT_CLINIC_OR_DEPARTMENT_OTHER): Payer: 59 | Admitting: Hematology and Oncology

## 2019-07-28 ENCOUNTER — Inpatient Hospital Stay: Payer: 59

## 2019-07-28 ENCOUNTER — Inpatient Hospital Stay: Payer: 59 | Attending: Hematology and Oncology

## 2019-07-28 ENCOUNTER — Other Ambulatory Visit: Payer: Self-pay

## 2019-07-28 DIAGNOSIS — Z5112 Encounter for antineoplastic immunotherapy: Secondary | ICD-10-CM | POA: Insufficient documentation

## 2019-07-28 DIAGNOSIS — Z171 Estrogen receptor negative status [ER-]: Secondary | ICD-10-CM

## 2019-07-28 DIAGNOSIS — C50212 Malignant neoplasm of upper-inner quadrant of left female breast: Secondary | ICD-10-CM

## 2019-07-28 DIAGNOSIS — Z95828 Presence of other vascular implants and grafts: Secondary | ICD-10-CM

## 2019-07-28 LAB — IRON AND TIBC
Iron: 67 ug/dL (ref 41–142)
Saturation Ratios: 18 % — ABNORMAL LOW (ref 21–57)
TIBC: 371 ug/dL (ref 236–444)
UIBC: 304 ug/dL (ref 120–384)

## 2019-07-28 LAB — FERRITIN: Ferritin: 134 ng/mL (ref 11–307)

## 2019-07-28 MED ORDER — TRASTUZUMAB-ANNS CHEMO 150 MG IV SOLR
6.0000 mg/kg | Freq: Once | INTRAVENOUS | Status: AC
  Administered 2019-07-28: 378 mg via INTRAVENOUS
  Filled 2019-07-28: qty 18

## 2019-07-28 MED ORDER — HEPARIN SOD (PORK) LOCK FLUSH 100 UNIT/ML IV SOLN
500.0000 [IU] | Freq: Once | INTRAVENOUS | Status: AC | PRN
  Administered 2019-07-28: 500 [IU]
  Filled 2019-07-28: qty 5

## 2019-07-28 MED ORDER — SODIUM CHLORIDE 0.9% FLUSH
10.0000 mL | INTRAVENOUS | Status: DC | PRN
  Administered 2019-07-28: 10 mL
  Filled 2019-07-28: qty 10

## 2019-07-28 MED ORDER — SODIUM CHLORIDE 0.9 % IV SOLN
420.0000 mg | Freq: Once | INTRAVENOUS | Status: AC
  Administered 2019-07-28: 420 mg via INTRAVENOUS
  Filled 2019-07-28: qty 14

## 2019-07-28 MED ORDER — DIPHENHYDRAMINE HCL 25 MG PO CAPS
ORAL_CAPSULE | ORAL | Status: AC
  Filled 2019-07-28: qty 1

## 2019-07-28 MED ORDER — DIPHENHYDRAMINE HCL 25 MG PO CAPS
25.0000 mg | ORAL_CAPSULE | Freq: Once | ORAL | Status: AC
  Administered 2019-07-28: 25 mg via ORAL

## 2019-07-28 MED ORDER — ACETAMINOPHEN 325 MG PO TABS
650.0000 mg | ORAL_TABLET | Freq: Once | ORAL | Status: AC
  Administered 2019-07-28: 650 mg via ORAL

## 2019-07-28 MED ORDER — ACETAMINOPHEN 325 MG PO TABS
ORAL_TABLET | ORAL | Status: AC
  Filled 2019-07-28: qty 2

## 2019-07-28 MED ORDER — SODIUM CHLORIDE 0.9 % IV SOLN
Freq: Once | INTRAVENOUS | Status: AC
  Filled 2019-07-28: qty 250

## 2019-07-28 NOTE — Patient Instructions (Signed)
[facility] Discharge Instructions for Patients Receiving Chemotherapy  Today you received the following chemotherapy agents: trastuzumab and pertuzumab.  To help prevent nausea and vomiting after your treatment, we encourage you to take your nausea medication as directed.   If you develop nausea and vomiting that is not controlled by your nausea medication, call the clinic.   BELOW ARE SYMPTOMS THAT SHOULD BE REPORTED IMMEDIATELY:  *FEVER GREATER THAN 100.5 F  *CHILLS WITH OR WITHOUT FEVER  NAUSEA AND VOMITING THAT IS NOT CONTROLLED WITH YOUR NAUSEA MEDICATION  *UNUSUAL SHORTNESS OF BREATH  *UNUSUAL BRUISING OR BLEEDING  TENDERNESS IN MOUTH AND THROAT WITH OR WITHOUT PRESENCE OF ULCERS  *URINARY PROBLEMS  *BOWEL PROBLEMS  UNUSUAL RASH Items with * indicate a potential emergency and should be followed up as soon as possible.  Feel free to call the clinic should you have any questions or concerns. The clinic phone number is [phone].  Please show the CHEMO ALERT CARD at check-in to the Emergency Department and triage nurse.   

## 2019-07-28 NOTE — Progress Notes (Signed)
Radiation Oncology         [phone] ________________________________  Name: Erin Lawson MRN: [id number]  Date: 07/29/2019  DOB: [birth year]  Follow-Up Visit Note  CC: Forrest Moron, MD  Nicholas Lose, MD    ICD-10-CM   1. Malignant neoplasm of upper-inner quadrant of left breast in female, estrogen receptor negative ([place])  C50.212    Z17.1     Diagnosis: StagepT1a, pN0LeftBreast UIQ,Invasive DuctalCarcinoma with DCIS, ER-/ PR-/ Her2+, Grade3  Interval Since Last Radiation: One month and one week.  Radiation Treatment Dates: 05/24/2019 through 06/22/2019 Site Technique Total Dose (Gy) Dose per Fx (Gy) Completed Fx Beam Energies  Breast, Left: Breast_Lt 3D 40.05/40.05 2.67 15/15 6X  Breast, Left: Breast_Lt_Bst 3D 12/12 2 6/6 6X    Narrative:  The patient returns today for routine follow-up. She was last seen by Dr. Lindi Adie yesterday, 07/28/2019. She continues on Herceptin Perjeta maintenance that is to be completed in October of this year. She is tolerating it well.  On review of systems, she reports intermittent shooting pains in her left breast rated 8/10.  These only last for a few seconds   she denies fatigue.  Denies any problems with swelling in her left arm or hand                           ALLERGIES:  is allergic to atenolol, poractant alfa, pork-derived products, and meloxicam.  Meds: Current Outpatient Medications  Medication Sig Dispense Refill  . amLODipine (NORVASC) 5 MG tablet Take 1 tablet (5 mg total) by mouth every other day. (Patient taking differently: Take 5 mg by mouth every other day. Takes M, W, F) 90 tablet 1  . blood glucose meter kit and supplies KIT Dispense based on patient and insurance preference. Use up to four times daily as directed. (FOR ICD-9 250.00, 250.01). 1 each 0  . blood glucose meter kit and supplies Dispense based on patient and insurance preference. Use up to four times daily as directed. (FOR ICD-10 E10.9, E11.9). 1  each 0  . glucose blood (CONTOUR NEXT TEST) test strip Check sugar once daily  Dx:  DMII controlled non-insulin dependent. 100 each 3  . LANCETS ULTRA FINE MISC Check sugar up one time daily dx: new onset DMII uncontrolled non-insulin 100 each 3  . magic mouthwash w/lidocaine SOLN Take 5 mLs by mouth 3 (three) times daily as needed for mouth pain. (Patient not taking: Reported on 07/29/2019) 100 mL 0   No current facility-administered medications for this encounter.    Physical Findings: The patient is in no acute distress. Patient is alert and oriented.  height is '5\' 1"'  (1.549 m) and weight is 150 lb 3.2 oz (68.1 kg). Her temperature is 98.5 F (36.9 C). Her blood pressure is 141/65 (abnormal) and her pulse is 72. Her respiration is 18 and oxygen saturation is 98%.   No significant changes. Lungs are clear to auscultation bilaterally. Heart has regular rate and rhythm. No palpable cervical, supraclavicular, or axillary adenopathy. Abdomen soft, non-tender, normal bowel sounds. Right breast: No palpable mass, nipple discharge, or bleeding. Left breast: Skin is healed well.  Hyperpigmentation changes noted and some mild swelling.  No dominant mass appreciated in the breast nipple discharge or bleeding  Lab Findings: Lab Results  Component Value Date   WBC 6.0 06/16/2019   HGB 9.5 (L) 06/16/2019   HCT 29.7 (L) 06/16/2019   MCV 77.3 (L) 06/16/2019  PLT 287 06/16/2019    Radiographic Findings: No results found.  Impression: StagepT1a, pN0LeftBreast UIQ,Invasive DuctalCarcinoma with DCIS, ER-/ PR-/ Her2+, Grade3  The patient has recovered well from her radiation therapy.  He does have some lymphedema in the breast but this does not seem to be bothering her at this time  Plan: The patient is scheduled to follow-up with Cira Rue, medical oncology NP, on 09/07/2019.  She will follow-up with radiation oncology on a as needed  basis    ____________________________________   Blair Promise, PhD, MD  This document serves as a record of services personally performed by Gery Pray, MD. It was created on his behalf by Clerance Lav, a trained medical scribe. The creation of this record is based on the scribe's personal observations and the provider's statements to them. This document has been checked and approved by the attending provider.

## 2019-07-28 NOTE — Assessment & Plan Note (Signed)
10/26/2018:Patient palpated a left breast mass x 5 weeks. Mammogram showed a 2.8cm mass with calcifications spanning 3.2cm in the left breast and two mildly thickened axillary lymph nodes up to 0.4cm. Biopsy showed IDC, grade 3, HER-2 + (3+), ER/PR -, Ki67 50%, and no evidence of carcinoma in the left axilla.  Treatment plan: 1. Neoadjuvant chemotherapy with [place] Perjeta 6 cycles(completed on 03/05/19)followed by HerceptinPerjetamaintenance for 1 year  2. 04/21/2019:Left lumpectomy ([facility]): multiple foci of residual carcinoma(0.25 cm in greatest dimension), clear margins, 4 left axillary lymph nodes negative.Repeat prognostic panel ER negative PR negative, Ki-67 40%, HER-2 positive 3. Followed by adjuvant radiation therapy ------------------------------------------------------------------------------------------------------------------------------------------------------------- Current treatment: Maintenance Herceptin Perjeta (to be completed October 2021) and adjuvant radiation Toxicities of Herceptin Perjeta: Tolerating it well without any problems or concerns. Radiation dermatitis: Applying ointments.  Return to clinic every 3 weeks for Herceptin Perjeta and every 6 weeks for follow-up with me. 

## 2019-07-29 ENCOUNTER — Ambulatory Visit
Admission: RE | Admit: 2019-07-29 | Discharge: 2019-07-29 | Disposition: A | Discharge status: Home / Self Care | Payer: 59 | Source: Ambulatory Visit | Attending: Radiation Oncology | Admitting: Radiation Oncology

## 2019-07-29 ENCOUNTER — Other Ambulatory Visit: Payer: Self-pay

## 2019-07-29 ENCOUNTER — Telehealth: Payer: Self-pay | Admitting: Hematology and Oncology

## 2019-07-29 ENCOUNTER — Encounter: Payer: Self-pay | Admitting: Radiation Oncology

## 2019-07-29 DIAGNOSIS — Z79899 Other long term (current) drug therapy: Secondary | ICD-10-CM | POA: Insufficient documentation

## 2019-07-29 DIAGNOSIS — Z171 Estrogen receptor negative status [ER-]: Secondary | ICD-10-CM | POA: Insufficient documentation

## 2019-07-29 DIAGNOSIS — C50212 Malignant neoplasm of upper-inner quadrant of left female breast: Secondary | ICD-10-CM | POA: Insufficient documentation

## 2019-07-29 DIAGNOSIS — Z923 Personal history of irradiation: Secondary | ICD-10-CM | POA: Diagnosis not present

## 2019-07-29 DIAGNOSIS — I890 Lymphedema, not elsewhere classified: Secondary | ICD-10-CM | POA: Insufficient documentation

## 2019-07-29 NOTE — Progress Notes (Signed)
Patient here for a 1 month f/u visit with Dr. Sondra Come. Reports intermittent shooting pain in her left breast daily and rates it an "8." Reports occasionally feeling like there is a knot under her left breast. Denies fatigue.

## 2019-07-29 NOTE — Telephone Encounter (Signed)
Rescheduled provider appt per 7/14 los

## 2019-08-16 ENCOUNTER — Inpatient Hospital Stay: Payer: 59 | Attending: Hematology and Oncology

## 2019-08-16 ENCOUNTER — Other Ambulatory Visit: Payer: Self-pay

## 2019-08-16 VITALS — BP 149/79 | HR 72 | Temp 98.1°F | Resp 16

## 2019-08-16 DIAGNOSIS — C50212 Malignant neoplasm of upper-inner quadrant of left female breast: Secondary | ICD-10-CM | POA: Diagnosis present

## 2019-08-16 DIAGNOSIS — Z5112 Encounter for antineoplastic immunotherapy: Secondary | ICD-10-CM | POA: Insufficient documentation

## 2019-08-16 DIAGNOSIS — Z171 Estrogen receptor negative status [ER-]: Secondary | ICD-10-CM | POA: Diagnosis not present

## 2019-08-16 MED ORDER — DIPHENHYDRAMINE HCL 25 MG PO CAPS
ORAL_CAPSULE | ORAL | Status: AC
  Filled 2019-08-16: qty 1

## 2019-08-16 MED ORDER — TRASTUZUMAB-ANNS CHEMO 150 MG IV SOLR
6.0000 mg/kg | Freq: Once | INTRAVENOUS | Status: AC
  Administered 2019-08-16: 378 mg via INTRAVENOUS
  Filled 2019-08-16: qty 18

## 2019-08-16 MED ORDER — DIPHENHYDRAMINE HCL 25 MG PO CAPS
25.0000 mg | ORAL_CAPSULE | Freq: Once | ORAL | Status: AC
  Administered 2019-08-16: 25 mg via ORAL

## 2019-08-16 MED ORDER — HEPARIN SOD (PORK) LOCK FLUSH 100 UNIT/ML IV SOLN
500.0000 [IU] | Freq: Once | INTRAVENOUS | Status: AC | PRN
  Administered 2019-08-16: 500 [IU]
  Filled 2019-08-16: qty 5

## 2019-08-16 MED ORDER — SODIUM CHLORIDE 0.9 % IV SOLN
Freq: Once | INTRAVENOUS | Status: AC
  Filled 2019-08-16: qty 250

## 2019-08-16 MED ORDER — SODIUM CHLORIDE 0.9 % IV SOLN
420.0000 mg | Freq: Once | INTRAVENOUS | Status: AC
  Administered 2019-08-16: 420 mg via INTRAVENOUS
  Filled 2019-08-16: qty 14

## 2019-08-16 MED ORDER — ACETAMINOPHEN 325 MG PO TABS
650.0000 mg | ORAL_TABLET | Freq: Once | ORAL | Status: AC
  Administered 2019-08-16: 650 mg via ORAL

## 2019-08-16 MED ORDER — SODIUM CHLORIDE 0.9% FLUSH
10.0000 mL | INTRAVENOUS | Status: DC | PRN
  Administered 2019-08-16: 10 mL
  Filled 2019-08-16: qty 10

## 2019-08-16 MED ORDER — ACETAMINOPHEN 325 MG PO TABS
ORAL_TABLET | ORAL | Status: AC
  Filled 2019-08-16: qty 2

## 2019-08-16 NOTE — Progress Notes (Signed)
Dr. Lindi Adie advised ok to proceed with treatment today using echo results from 03/2019.

## 2019-08-16 NOTE — Patient Instructions (Signed)
[facility] Discharge Instructions for Patients Receiving Chemotherapy  Today you received the following chemotherapy agents: trastuzumab and pertuzumab.  To help prevent nausea and vomiting after your treatment, we encourage you to take your nausea medication as directed.   If you develop nausea and vomiting that is not controlled by your nausea medication, call the clinic.   BELOW ARE SYMPTOMS THAT SHOULD BE REPORTED IMMEDIATELY:  *FEVER GREATER THAN 100.5 F  *CHILLS WITH OR WITHOUT FEVER  NAUSEA AND VOMITING THAT IS NOT CONTROLLED WITH YOUR NAUSEA MEDICATION  *UNUSUAL SHORTNESS OF BREATH  *UNUSUAL BRUISING OR BLEEDING  TENDERNESS IN MOUTH AND THROAT WITH OR WITHOUT PRESENCE OF ULCERS  *URINARY PROBLEMS  *BOWEL PROBLEMS  UNUSUAL RASH Items with * indicate a potential emergency and should be followed up as soon as possible.  Feel free to call the clinic should you have any questions or concerns. The clinic phone number is [phone].  Please show the CHEMO ALERT CARD at check-in to the Emergency Department and triage nurse.   

## 2019-09-01 ENCOUNTER — Other Ambulatory Visit: Payer: Self-pay | Admitting: *Deleted

## 2019-09-01 DIAGNOSIS — M79662 Pain in left lower leg: Secondary | ICD-10-CM

## 2019-09-01 DIAGNOSIS — Z171 Estrogen receptor negative status [ER-]: Secondary | ICD-10-CM

## 2019-09-01 DIAGNOSIS — C50212 Malignant neoplasm of upper-inner quadrant of left female breast: Secondary | ICD-10-CM

## 2019-09-01 DIAGNOSIS — M7989 Other specified soft tissue disorders: Secondary | ICD-10-CM

## 2019-09-07 ENCOUNTER — Encounter: Payer: Self-pay | Admitting: *Deleted

## 2019-09-07 ENCOUNTER — Other Ambulatory Visit: Payer: 59

## 2019-09-07 ENCOUNTER — Ambulatory Visit: Payer: 59

## 2019-09-07 ENCOUNTER — Other Ambulatory Visit: Payer: Self-pay

## 2019-09-07 ENCOUNTER — Ambulatory Visit: Payer: 59 | Admitting: Hematology and Oncology

## 2019-09-07 ENCOUNTER — Ambulatory Visit: Payer: 59 | Admitting: Nurse Practitioner

## 2019-09-07 DIAGNOSIS — C50212 Malignant neoplasm of upper-inner quadrant of left female breast: Secondary | ICD-10-CM

## 2019-09-07 DIAGNOSIS — Z171 Estrogen receptor negative status [ER-]: Secondary | ICD-10-CM

## 2019-09-07 NOTE — Progress Notes (Signed)
Patient Care Team: Forrest Moron, MD as PCP - General (Internal Medicine) Mauro Kaufmann, RN as Oncology Nurse Navigator Rockwell Germany, RN as Oncology Nurse Navigator Erroll Luna, MD as Consulting Physician (General Surgery) Nicholas Lose, MD as Consulting Physician (Hematology and Oncology) Gery Pray, MD as Consulting Physician (Radiation Oncology)  DIAGNOSIS:    ICD-10-CM   1. Malignant neoplasm of upper-inner quadrant of left breast in female, estrogen receptor negative ([place])  C50.212    Z17.1     SUMMARY OF ONCOLOGIC HISTORY: Oncology History  Malignant neoplasm of upper-inner quadrant of left breast in female, estrogen receptor negative ([place])  10/26/2018 Initial Diagnosis   Patient palpated a left breast mass x 5 weeks. Mammogram showed a 2.8cm mass with calcifications spanning 3.2cm in the left breast and two mildly thickened axillary lymph nodes up to 0.4cm. Biopsy showed IDC, grade 3, HER-2 + (3+), ER/PR -, Ki67 50%, and no evidence of carcinoma in the left axilla.    10/28/2018 Cancer Staging   Staging form: Breast, AJCC 8th Edition - Clinical stage from 10/28/2018: Stage IIA (cT2, cN0, cM0, G2, ER-, PR-, HER2+) - Signed by Nicholas Lose, MD on 10/28/2018   11/12/2018 - 03/25/2019 Chemotherapy   The patient had palonosetron (ALOXI) injection 0.25 mg, 0.25 mg, Intravenous,  Once, 6 of 6 cycles Administration: 0.25 mg (11/12/2018), 0.25 mg (12/03/2018), 0.25 mg (02/10/2019), 0.25 mg (03/05/2019), 0.25 mg (12/23/2018), 0.25 mg (01/18/2019) pegfilgrastim (NEULASTA) injection 6 mg, 6 mg, Subcutaneous, Once, 5 of 5 cycles Administration: 6 mg (12/05/2018), 6 mg (12/25/2018), 6 mg (01/20/2019), 6 mg (02/12/2019), 6 mg (03/08/2019) pegfilgrastim-jmdb (FULPHILA) injection 6 mg, 6 mg, Subcutaneous,  Once, 1 of 1 cycle Administration: 6 mg (11/14/2018) CARBOplatin (PARAPLATIN) 500 mg in sodium chloride 0.9 % 250 mL chemo infusion, 500 mg (101.8 % of original dose 493 mg),  Intravenous,  Once, 6 of 6 cycles Dose modification:   (original dose 493 mg, Cycle 1, Reason: Provider Judgment) Administration: 500 mg (11/12/2018), 420 mg (12/03/2018), 420 mg (02/10/2019), 420 mg (03/05/2019), 420 mg (12/23/2018), 420 mg (01/18/2019) DOCEtaxel (TAXOTERE) 110 mg in sodium chloride 0.9 % 250 mL chemo infusion, 65 mg/m2 = 110 mg (100 % of original dose 65 mg/m2), Intravenous,  Once, 6 of 6 cycles Dose modification: 65 mg/m2 (original dose 65 mg/m2, Cycle 1, Reason: Provider Judgment), 50 mg/m2 (original dose 65 mg/m2, Cycle 2, Reason: Dose not tolerated), 40 mg/m2 (original dose 65 mg/m2, Cycle 5, Reason: Dose not tolerated) Administration: 110 mg (11/12/2018), 80 mg (12/03/2018), 70 mg (02/10/2019), 70 mg (03/05/2019), 70 mg (12/23/2018), 70 mg (01/18/2019) pertuzumab (PERJETA) 420 mg in sodium chloride 0.9 % 250 mL chemo infusion, 420 mg (100 % of original dose 420 mg), Intravenous, Once, 6 of 6 cycles Dose modification: 420 mg (original dose 420 mg, Cycle 1, Reason: Provider Judgment) Administration: 420 mg (11/12/2018), 420 mg (12/03/2018), 420 mg (02/10/2019), 420 mg (03/05/2019), 420 mg (12/23/2018), 420 mg (01/18/2019) fosaprepitant (EMEND) 150 mg, dexamethasone (DECADRON) 12 mg in sodium chloride 0.9 % 145 mL IVPB, , Intravenous,  Once, 6 of 6 cycles Administration:  (11/12/2018),  (12/03/2018),  (02/10/2019),  (03/05/2019),  (12/23/2018),  (01/18/2019) trastuzumab-anns (KANJINTI) 399 mg in sodium chloride 0.9 % 250 mL chemo infusion, 6 mg/kg = 399 mg (100 % of original dose 6 mg/kg), Intravenous,  Once, 5 of 5 cycles Dose modification: 6 mg/kg (original dose 6 mg/kg, Cycle 2, Reason: Other (see comments), Comment: J-code in auth referral is for Kanjinti) Administration: 399 mg (12/03/2018),  399 mg (12/23/2018), 399 mg (01/18/2019), 399 mg (02/10/2019), 399 mg (03/05/2019) trastuzumab-dkst (OGIVRI) 525 mg in sodium chloride 0.9 % 250 mL chemo infusion, 8 mg/kg = 525 mg, Intravenous,  Once, 1 of 1  cycle Administration: 525 mg (11/12/2018)  for chemotherapy treatment.    04/02/2019 -  Chemotherapy   The patient had pertuzumab (PERJETA) 420 mg in sodium chloride 0.9 % 250 mL chemo infusion, 420 mg (100 % of original dose 420 mg), Intravenous, Once, 7 of 11 cycles Dose modification: 420 mg (original dose 420 mg, Cycle 1, Reason: Provider Judgment) Administration: 420 mg (04/02/2019), 420 mg (04/29/2019), 420 mg (07/05/2019), 420 mg (05/20/2019), 420 mg (06/16/2019), 420 mg (07/28/2019), 420 mg (08/16/2019) trastuzumab-anns (KANJINTI) 378 mg in sodium chloride 0.9 % 250 mL chemo infusion, 6 mg/kg = 378 mg (100 % of original dose 6 mg/kg), Intravenous,  Once, 6 of 10 cycles Dose modification: 6 mg/kg (original dose 6 mg/kg, Cycle 2, Reason: Other (see comments), Comment: Preferred biosimilar ) Administration: 378 mg (04/29/2019), 378 mg (05/20/2019), 378 mg (06/16/2019), 378 mg (07/05/2019), 378 mg (07/28/2019), 378 mg (08/16/2019) trastuzumab-dkst (OGIVRI) 399 mg in sodium chloride 0.9 % 250 mL chemo infusion, 378 mg (100 % of original dose 6 mg/kg), Intravenous,  Once, 1 of 1 cycle Dose modification: 6 mg/kg (original dose 6 mg/kg, Cycle 1, Reason: Provider Judgment) Administration: 399 mg (04/02/2019)  for chemotherapy treatment.    04/21/2019 Surgery   Left lumpectomy ([facility]): multiple foci of residual carcinoma (0.25 cm max), clear margins, 4 left axillary lymph nodes negative.    05/25/2019 - 06/22/2019 Radiation Therapy   Adjuvant radiation     CHIEF COMPLIANT: Herceptin Perjeta maintenance  INTERVAL HISTORY: Erin Lawson is a 59 y.o. with above-mentioned history of left breast cancerwho completedneoadjuvant chemotherapy,underwent a lumpectomy, radiation, and is currently on Herceptin Perjeta maintenance.She presents to the clinic todayfor treatment.  ALLERGIES:  is allergic to atenolol, poractant alfa, pork-derived products, and meloxicam.  MEDICATIONS:  Current Outpatient Medications   Medication Sig Dispense Refill  . amLODipine (NORVASC) 5 MG tablet Take 1 tablet (5 mg total) by mouth every other day. (Patient taking differently: Take 5 mg by mouth every other day. Takes M, W, F) 90 tablet 1  . blood glucose meter kit and supplies KIT Dispense based on patient and insurance preference. Use up to four times daily as directed. (FOR ICD-9 250.00, 250.01). 1 each 0  . blood glucose meter kit and supplies Dispense based on patient and insurance preference. Use up to four times daily as directed. (FOR ICD-10 E10.9, E11.9). 1 each 0  . glucose blood (CONTOUR NEXT TEST) test strip Check sugar once daily  Dx:  DMII controlled non-insulin dependent. 100 each 3  . LANCETS ULTRA FINE MISC Check sugar up one time daily dx: new onset DMII uncontrolled non-insulin 100 each 3  . magic mouthwash w/lidocaine SOLN Take 5 mLs by mouth 3 (three) times daily as needed for mouth pain. (Patient not taking: Reported on 07/29/2019) 100 mL 0   No current facility-administered medications for this visit.   Facility-Administered Medications Ordered in Other Visits  Medication Dose Route Frequency Provider Last Rate Last Admin  . sodium chloride flush (NS) 0.9 % injection 10 mL  10 mL Intracatheter PRN Nicholas Lose, MD   10 mL at 09/08/19 1110    PHYSICAL EXAMINATION: ECOG PERFORMANCE STATUS: 1 - Symptomatic but completely ambulatory  There were no vitals filed for this visit. There were no vitals filed for this  visit.  LABORATORY DATA:  I have reviewed the data as listed CMP Latest Ref Rng & Units 06/16/2019 04/29/2019 04/15/2019  Glucose 70 - 99 mg/dL 125(H) 138(H) 166(H)  BUN 6 - 20 mg/dL _0 Creatinine 0.44 - 1.00 mg/dL 0.75 0.78 0.64  Sodium 135 - 145 mmol/L 142 142 139  Potassium 3.5 - 5.1 mmol/L 3.6 3.6 3.9  Chloride 98 - 111 mmol/L 108 107 106  CO2 22 - 32 mmol/L _1 Calcium 8.9 - 10.3 mg/dL 9.4 9.3 9.2  Total Protein 6.5 - 8.1 g/dL 6.9 7.3 6.3(L)  Total Bilirubin 0.3 - 1.2  mg/dL 0.4 0.4 0.6  Alkaline Phos 38 - 126 U/L 138(H) 138(H) 121  AST 15 - 41 U/L 17 13(L) 16  ALT 0 - 44 U/L _2 Lab Results  Component Value Date   WBC 8.0 09/08/2019   HGB 9.6 (L) 09/08/2019   HCT 30.4 (L) 09/08/2019   MCV 74.0 (L) 09/08/2019   PLT 297 09/08/2019   NEUTROABS 4.5 09/08/2019    ASSESSMENT & PLAN:  Malignant neoplasm of upper-inner quadrant of left breast in female, estrogen receptor negative ([place]) 10/26/2018:Patient palpated a left breast mass x 5 weeks. Mammogram showed a 2.8cm mass with calcifications spanning 3.2cm in the left breast and two mildly thickened axillary lymph nodes up to 0.4cm. Biopsy showed IDC, grade 3, HER-2 + (3+), ER/PR -, Ki67 50%, and no evidence of carcinoma in the left axilla.  Treatment plan: 1. Neoadjuvant chemotherapy with [place] Perjeta 6 cycles(completed on 03/05/19)followed by HerceptinPerjetamaintenance for 1 year  2. 04/21/2019:Left lumpectomy ([facility]): multiple foci of residual carcinoma(0.25 cm in greatest dimension), clear margins, 4 left axillary lymph nodes negative.Repeat prognostic panel ER negative PR negative, Ki-67 40%, HER-2 positive 3. Followed by adjuvant radiation therapy ------------------------------------------------------------------------------------------------------------------------------------------------------------- Current treatment: Maintenance Herceptin Perjeta(to be completed October 2021)and adjuvant radiation Toxicities of Herceptin Perjeta:Tolerating it well without any problems or concerns. Radiation dermatitis: Markedly better.  Patient continues to work full-time in [employer] system.    Return to clinic every 3 weeks for Herceptin Perjeta and every 6 weeks for follow-up with me. I discussed with her about COVID-19 vaccine but she is very anxious and worried about side effects and wants to think about it.   No orders of the defined types were placed in this  encounter.  The patient has a good understanding of the overall plan. she agrees with it. she will call with any problems that may develop before the next visit here.  Total time spent: 30 mins including face to face time and time spent for planning, charting and coordination of care  Gardenia Phlegm* 09/08/2019  I, Cloyde Reams Dorshimer, am acting as scribe for Dr. Nicholas Lose.  I have reviewed the above documentation for accuracy and completeness, and I agree with the above.

## 2019-09-08 ENCOUNTER — Inpatient Hospital Stay: Payer: 59

## 2019-09-08 ENCOUNTER — Inpatient Hospital Stay (HOSPITAL_BASED_OUTPATIENT_CLINIC_OR_DEPARTMENT_OTHER): Payer: 59 | Admitting: Hematology and Oncology

## 2019-09-08 ENCOUNTER — Other Ambulatory Visit: Payer: Self-pay

## 2019-09-08 ENCOUNTER — Other Ambulatory Visit: Payer: Self-pay | Admitting: *Deleted

## 2019-09-08 DIAGNOSIS — C50212 Malignant neoplasm of upper-inner quadrant of left female breast: Secondary | ICD-10-CM

## 2019-09-08 DIAGNOSIS — Z171 Estrogen receptor negative status [ER-]: Secondary | ICD-10-CM | POA: Diagnosis not present

## 2019-09-08 DIAGNOSIS — Z95828 Presence of other vascular implants and grafts: Secondary | ICD-10-CM

## 2019-09-08 DIAGNOSIS — Z5112 Encounter for antineoplastic immunotherapy: Secondary | ICD-10-CM | POA: Diagnosis not present

## 2019-09-08 DIAGNOSIS — Z79899 Other long term (current) drug therapy: Secondary | ICD-10-CM

## 2019-09-08 DIAGNOSIS — Z5181 Encounter for therapeutic drug level monitoring: Secondary | ICD-10-CM

## 2019-09-08 LAB — CBC WITH DIFFERENTIAL (CANCER CENTER ONLY)
Abs Immature Granulocytes: 0.07 10*3/uL (ref 0.00–0.07)
Basophils Absolute: 0 10*3/uL (ref 0.0–0.1)
Basophils Relative: 0 %
Eosinophils Absolute: 0.1 10*3/uL (ref 0.0–0.5)
Eosinophils Relative: 1 %
HCT: 30.4 % — ABNORMAL LOW (ref 36.0–46.0)
Hemoglobin: 9.6 g/dL — ABNORMAL LOW (ref 12.0–15.0)
Immature Granulocytes: 1 %
Lymphocytes Relative: 34 %
Lymphs Abs: 2.7 10*3/uL (ref 0.7–4.0)
MCH: 23.4 pg — ABNORMAL LOW (ref 26.0–34.0)
MCHC: 31.6 g/dL (ref 30.0–36.0)
MCV: 74 fL — ABNORMAL LOW (ref 80.0–100.0)
Monocytes Absolute: 0.6 10*3/uL (ref 0.1–1.0)
Monocytes Relative: 7 %
Neutro Abs: 4.5 10*3/uL (ref 1.7–7.7)
Neutrophils Relative %: 57 %
Platelet Count: 297 10*3/uL (ref 150–400)
RBC: 4.11 MIL/uL (ref 3.87–5.11)
RDW: 13.9 % (ref 11.5–15.5)
WBC Count: 8 10*3/uL (ref 4.0–10.5)
nRBC: 0 % (ref 0.0–0.2)

## 2019-09-08 LAB — CMP (CANCER CENTER ONLY)
ALT: 19 U/L (ref 0–44)
AST: 18 U/L (ref 15–41)
Albumin: 4 g/dL (ref 3.5–5.0)
Alkaline Phosphatase: 151 U/L — ABNORMAL HIGH (ref 38–126)
Anion gap: 8 (ref 5–15)
BUN: 15 mg/dL (ref 6–20)
CO2: 25 mmol/L (ref 22–32)
Calcium: 10.1 mg/dL (ref 8.9–10.3)
Chloride: 104 mmol/L (ref 98–111)
Creatinine: 0.79 mg/dL (ref 0.44–1.00)
GFR, Est AFR Am: >60 mL/min (ref >60)
GFR, Estimated: >60 mL/min (ref >60)
Glucose, Bld: 208 mg/dL — ABNORMAL HIGH (ref 70–99)
Potassium: 4 mmol/L (ref 3.5–5.1)
Sodium: 137 mmol/L (ref 135–145)
Total Bilirubin: 0.3 mg/dL (ref 0.3–1.2)
Total Protein: 7 g/dL (ref 6.5–8.1)

## 2019-09-08 MED ORDER — SODIUM CHLORIDE 0.9 % IV SOLN
Freq: Once | INTRAVENOUS | Status: AC
  Filled 2019-09-08: qty 250

## 2019-09-08 MED ORDER — SODIUM CHLORIDE 0.9% FLUSH
10.0000 mL | INTRAVENOUS | Status: DC | PRN
  Administered 2019-09-08: 10 mL
  Filled 2019-09-08: qty 10

## 2019-09-08 MED ORDER — HEPARIN SOD (PORK) LOCK FLUSH 100 UNIT/ML IV SOLN
500.0000 [IU] | Freq: Once | INTRAVENOUS | Status: AC | PRN
  Administered 2019-09-08: 500 [IU]
  Filled 2019-09-08: qty 5

## 2019-09-08 MED ORDER — TRASTUZUMAB-ANNS CHEMO 150 MG IV SOLR
6.0000 mg/kg | Freq: Once | INTRAVENOUS | Status: AC
  Administered 2019-09-08: 378 mg via INTRAVENOUS
  Filled 2019-09-08: qty 18

## 2019-09-08 MED ORDER — SODIUM CHLORIDE 0.9 % IV SOLN
420.0000 mg | Freq: Once | INTRAVENOUS | Status: AC
  Administered 2019-09-08: 420 mg via INTRAVENOUS
  Filled 2019-09-08: qty 14

## 2019-09-08 MED ORDER — ACETAMINOPHEN 325 MG PO TABS
650.0000 mg | ORAL_TABLET | Freq: Once | ORAL | Status: AC
  Administered 2019-09-08: 650 mg via ORAL

## 2019-09-08 MED ORDER — DIPHENHYDRAMINE HCL 25 MG PO CAPS
25.0000 mg | ORAL_CAPSULE | Freq: Once | ORAL | Status: AC
  Administered 2019-09-08: 25 mg via ORAL

## 2019-09-08 MED ORDER — DIPHENHYDRAMINE HCL 25 MG PO CAPS
ORAL_CAPSULE | ORAL | Status: AC
  Filled 2019-09-08: qty 1

## 2019-09-08 MED ORDER — ACETAMINOPHEN 325 MG PO TABS
ORAL_TABLET | ORAL | Status: AC
  Filled 2019-09-08: qty 2

## 2019-09-08 NOTE — Assessment & Plan Note (Signed)
10/26/2018:Patient palpated a left breast mass x 5 weeks. Mammogram showed a 2.8cm mass with calcifications spanning 3.2cm in the left breast and two mildly thickened axillary lymph nodes up to 0.4cm. Biopsy showed IDC, grade 3, HER-2 + (3+), ER/PR -, Ki67 50%, and no evidence of carcinoma in the left axilla.  Treatment plan: 1. Neoadjuvant chemotherapy with [place] Perjeta 6 cycles(completed on 03/05/19)followed by HerceptinPerjetamaintenance for 1 year  2. 04/21/2019:Left lumpectomy ([facility]): multiple foci of residual carcinoma(0.25 cm in greatest dimension), clear margins, 4 left axillary lymph nodes negative.Repeat prognostic panel ER negative PR negative, Ki-67 40%, HER-2 positive 3. Followed by adjuvant radiation therapy ------------------------------------------------------------------------------------------------------------------------------------------------------------- Current treatment: Maintenance Herceptin Perjeta(to be completed October 2021)and adjuvant radiation Toxicities of Herceptin Perjeta:Tolerating it well without any problems or concerns. Radiation dermatitis: Markedly better.  Patient continues to work full-time in [employer] system.    Return to clinic every 3 weeks for Herceptin Perjeta and every 6 weeks for follow-up with me.

## 2019-09-08 NOTE — Patient Instructions (Signed)
[facility] Discharge Instructions for Patients Receiving Chemotherapy  Today you received the following chemotherapy agents Trastuzumab and perjeta  To help prevent nausea and vomiting after your treatment, we encourage you to take your nausea medication as directed   If you develop nausea and vomiting that is not controlled by your nausea medication, call the clinic.   BELOW ARE SYMPTOMS THAT SHOULD BE REPORTED IMMEDIATELY:  *FEVER GREATER THAN 100.5 F  *CHILLS WITH OR WITHOUT FEVER  NAUSEA AND VOMITING THAT IS NOT CONTROLLED WITH YOUR NAUSEA MEDICATION  *UNUSUAL SHORTNESS OF BREATH  *UNUSUAL BRUISING OR BLEEDING  TENDERNESS IN MOUTH AND THROAT WITH OR WITHOUT PRESENCE OF ULCERS  *URINARY PROBLEMS  *BOWEL PROBLEMS  UNUSUAL RASH Items with * indicate a potential emergency and should be followed up as soon as possible.  Feel free to call the clinic should you have any questions or concerns. The clinic phone number is [phone].  Please show the [place] at check-in to the Emergency Department and triage nurse.

## 2019-09-08 NOTE — Progress Notes (Signed)
Per MD okay to treat with echo from 04/06/19.  Orders placed per MD request for echo to be completed within the next 2 weeks.

## 2019-09-09 ENCOUNTER — Telehealth: Payer: Self-pay | Admitting: Hematology and Oncology

## 2019-09-09 ENCOUNTER — Other Ambulatory Visit: Payer: Self-pay | Admitting: *Deleted

## 2019-09-09 DIAGNOSIS — C50212 Malignant neoplasm of upper-inner quadrant of left female breast: Secondary | ICD-10-CM

## 2019-09-09 NOTE — Telephone Encounter (Signed)
No 8/25 los, no changes made to pt schedule   

## 2019-09-09 NOTE — Progress Notes (Signed)
Pt complaint of bilateral leg pain, swelling, and cramping per MD pt to receive bilateral lower extremity US to r/o DVT.  Orders placed.

## 2019-09-14 ENCOUNTER — Other Ambulatory Visit (HOSPITAL_COMMUNITY): Payer: 59

## 2019-09-14 ENCOUNTER — Ambulatory Visit (HOSPITAL_BASED_OUTPATIENT_CLINIC_OR_DEPARTMENT_OTHER)
Admission: RE | Admit: 2019-09-14 | Discharge: 2019-09-14 | Disposition: A | Discharge status: Home / Self Care | Payer: 59 | Source: Ambulatory Visit | Attending: Hematology and Oncology | Admitting: Hematology and Oncology

## 2019-09-14 ENCOUNTER — Encounter (HOSPITAL_COMMUNITY): Payer: 59

## 2019-09-14 ENCOUNTER — Ambulatory Visit (HOSPITAL_COMMUNITY)
Admission: RE | Admit: 2019-09-14 | Discharge: 2019-09-14 | Disposition: A | Discharge status: Home / Self Care | Payer: 59 | Source: Ambulatory Visit | Attending: Hematology and Oncology | Admitting: Hematology and Oncology

## 2019-09-14 ENCOUNTER — Other Ambulatory Visit: Payer: Self-pay

## 2019-09-14 DIAGNOSIS — Z79899 Other long term (current) drug therapy: Secondary | ICD-10-CM | POA: Insufficient documentation

## 2019-09-14 DIAGNOSIS — Z171 Estrogen receptor negative status [ER-]: Secondary | ICD-10-CM | POA: Diagnosis present

## 2019-09-14 DIAGNOSIS — Z5181 Encounter for therapeutic drug level monitoring: Secondary | ICD-10-CM | POA: Insufficient documentation

## 2019-09-14 DIAGNOSIS — E119 Type 2 diabetes mellitus without complications: Secondary | ICD-10-CM | POA: Insufficient documentation

## 2019-09-14 DIAGNOSIS — K219 Gastro-esophageal reflux disease without esophagitis: Secondary | ICD-10-CM | POA: Insufficient documentation

## 2019-09-14 DIAGNOSIS — C50212 Malignant neoplasm of upper-inner quadrant of left female breast: Secondary | ICD-10-CM | POA: Diagnosis present

## 2019-09-14 DIAGNOSIS — I10 Essential (primary) hypertension: Secondary | ICD-10-CM | POA: Insufficient documentation

## 2019-09-14 DIAGNOSIS — C50919 Malignant neoplasm of unspecified site of unspecified female breast: Secondary | ICD-10-CM | POA: Insufficient documentation

## 2019-09-14 LAB — ECHOCARDIOGRAM COMPLETE
Area-P 1/2: 3.21 cm2
S' Lateral: 2.4 cm

## 2019-09-14 MED ORDER — GADOBUTROL 1 MMOL/ML IV SOLN
6.0000 mL | Freq: Once | INTRAVENOUS | Status: AC | PRN
  Administered 2019-09-14: 6 mL via INTRAVENOUS

## 2019-09-14 NOTE — Progress Notes (Signed)
Bilateral lower extremity venous duplex has been completed. Preliminary results can be found in CV Proc through chart review.  Results were given to [facility] at Dr. Geralyn Flash office.  09/14/19 9:55 AM Erin Lawson RVT

## 2019-09-14 NOTE — Progress Notes (Signed)
°  Echocardiogram 2D Echocardiogram has been performed.  Lezli Danek G Lydon Vansickle 09/14/2019, 10:24 AM

## 2019-09-27 ENCOUNTER — Other Ambulatory Visit: Payer: Self-pay

## 2019-09-27 ENCOUNTER — Inpatient Hospital Stay: Payer: 59 | Attending: Hematology and Oncology

## 2019-09-27 VITALS — BP 175/73 | HR 68 | Temp 97.8°F | Resp 18 | Wt 153.2 lb

## 2019-09-27 DIAGNOSIS — C50212 Malignant neoplasm of upper-inner quadrant of left female breast: Secondary | ICD-10-CM | POA: Diagnosis present

## 2019-09-27 DIAGNOSIS — Z5112 Encounter for antineoplastic immunotherapy: Secondary | ICD-10-CM | POA: Insufficient documentation

## 2019-09-27 DIAGNOSIS — Z171 Estrogen receptor negative status [ER-]: Secondary | ICD-10-CM

## 2019-09-27 MED ORDER — DIPHENHYDRAMINE HCL 25 MG PO CAPS
25.0000 mg | ORAL_CAPSULE | Freq: Once | ORAL | Status: AC
  Administered 2019-09-27: 25 mg via ORAL

## 2019-09-27 MED ORDER — HEPARIN SOD (PORK) LOCK FLUSH 100 UNIT/ML IV SOLN
500.0000 [IU] | Freq: Once | INTRAVENOUS | Status: AC | PRN
  Administered 2019-09-27: 500 [IU]
  Filled 2019-09-27: qty 5

## 2019-09-27 MED ORDER — ACETAMINOPHEN 325 MG PO TABS
650.0000 mg | ORAL_TABLET | Freq: Once | ORAL | Status: AC
  Administered 2019-09-27: 650 mg via ORAL

## 2019-09-27 MED ORDER — SODIUM CHLORIDE 0.9 % IV SOLN
Freq: Once | INTRAVENOUS | Status: AC
  Filled 2019-09-27: qty 250

## 2019-09-27 MED ORDER — DIPHENHYDRAMINE HCL 25 MG PO CAPS
ORAL_CAPSULE | ORAL | Status: AC
  Filled 2019-09-27: qty 1

## 2019-09-27 MED ORDER — TRASTUZUMAB-ANNS CHEMO 150 MG IV SOLR
6.0000 mg/kg | Freq: Once | INTRAVENOUS | Status: AC
  Administered 2019-09-27: 378 mg via INTRAVENOUS
  Filled 2019-09-27: qty 18

## 2019-09-27 MED ORDER — SODIUM CHLORIDE 0.9 % IV SOLN
420.0000 mg | Freq: Once | INTRAVENOUS | Status: AC
  Administered 2019-09-27: 420 mg via INTRAVENOUS
  Filled 2019-09-27: qty 14

## 2019-09-27 MED ORDER — SODIUM CHLORIDE 0.9% FLUSH
10.0000 mL | INTRAVENOUS | Status: DC | PRN
  Administered 2019-09-27: 10 mL
  Filled 2019-09-27: qty 10

## 2019-09-27 MED ORDER — ACETAMINOPHEN 325 MG PO TABS
ORAL_TABLET | ORAL | Status: AC
  Filled 2019-09-27: qty 2

## 2019-09-27 NOTE — Patient Instructions (Signed)
[facility] Discharge Instructions for Patients Receiving Chemotherapy  Today you received the following chemotherapy agents Trastuzumab and perjeta  To help prevent nausea and vomiting after your treatment, we encourage you to take your nausea medication as directed   If you develop nausea and vomiting that is not controlled by your nausea medication, call the clinic.   BELOW ARE SYMPTOMS THAT SHOULD BE REPORTED IMMEDIATELY:  *FEVER GREATER THAN 100.5 F  *CHILLS WITH OR WITHOUT FEVER  NAUSEA AND VOMITING THAT IS NOT CONTROLLED WITH YOUR NAUSEA MEDICATION  *UNUSUAL SHORTNESS OF BREATH  *UNUSUAL BRUISING OR BLEEDING  TENDERNESS IN MOUTH AND THROAT WITH OR WITHOUT PRESENCE OF ULCERS  *URINARY PROBLEMS  *BOWEL PROBLEMS  UNUSUAL RASH Items with * indicate a potential emergency and should be followed up as soon as possible.  Feel free to call the clinic should you have any questions or concerns. The clinic phone number is [phone].  Please show the [place] at check-in to the Emergency Department and triage nurse.

## 2019-10-26 ENCOUNTER — Ambulatory Visit: Payer: Self-pay | Admitting: Surgery

## 2019-10-31 NOTE — Progress Notes (Signed)
Radiation Oncology         [phone] ________________________________  Name: Tyffani Foglesong MRN: [id number]  Date: 11/01/2019  DOB: 05-05-1960  Follow-Up Visit Note  CC: Forrest Moron, MD  Nicholas Lose, MD    ICD-10-CM   1. Malignant neoplasm of upper-inner quadrant of left breast in female, estrogen receptor negative ([place])  C50.212    Z17.1     Diagnosis: StagepT1a, pN0LeftBreast UIQ,Invasive DuctalCarcinoma with DCIS, ER-/ PR-/ Her2+, Grade3  Interval Since Last Radiation: Four months, one week, and three days  Radiation Treatment Dates: 05/24/2019 through 06/22/2019 Site Technique Total Dose (Gy) Dose per Fx (Gy) Completed Fx Beam Energies  Breast, Left: Breast_Lt 3D 40.05/40.05 2.67 15/15 6X  Breast, Left: Breast_Lt_Bst 3D 12/12 2 6/6 6X    Narrative:  The patient returns today for routine follow-up. She was last seen by Dr. Lindi Adie on 09/08/2019. She completes Herceptin Perjeta maintenance this month and has tolerated it well.  Of note, the patient underwent an echocardiogram on 09/14/2019 that showed an EF of 55-60%. The patient underwent an MRI of the left knee on that same day that showed lateral compartment arthritis with associated degenerative maceration of the anterior horn of the lateral meniscus and complex tearing in the anterior body of the lateral meniscus. There was no evidence of metastatic disease or acute abnormality.  On review of systems, she reports occasional sharp pains within the left breast. She denies nipple discharge or bleeding.  She denies any problems with swelling in her left arm or hand.                           ALLERGIES:  is allergic to atenolol, poractant alfa, pork-derived products, and meloxicam.  Meds: Current Outpatient Medications  Medication Sig Dispense Refill  . blood glucose meter kit and supplies KIT Dispense based on patient and insurance preference. Use up to four times daily as directed. (FOR ICD-9 250.00,  250.01). 1 each 0  . blood glucose meter kit and supplies Dispense based on patient and insurance preference. Use up to four times daily as directed. (FOR ICD-10 E10.9, E11.9). 1 each 0  . glucose blood (CONTOUR NEXT TEST) test strip Check sugar once daily  Dx:  DMII controlled non-insulin dependent. 100 each 3  . LANCETS ULTRA FINE MISC Check sugar up one time daily dx: new onset DMII uncontrolled non-insulin 100 each 3  . magic mouthwash w/lidocaine SOLN Take 5 mLs by mouth 3 (three) times daily as needed for mouth pain. (Patient not taking: Reported on 07/29/2019) 100 mL 0   No current facility-administered medications for this encounter.    Physical Findings: The patient is in no acute distress. Patient is alert and oriented.  height is _0  (1.549 m) and weight is 149 lb 6 oz (67.8 kg). Her temporal temperature is 97.4 F (36.3 C) (abnormal). Her blood pressure is 163/91 (abnormal) and her pulse is 87. Her respiration is 18 and oxygen saturation is 98%.   Lungs are clear to auscultation bilaterally. Heart has regular rate and rhythm. No palpable cervical, supraclavicular, or axillary adenopathy. Abdomen soft, non-tender, normal bowel sounds. Right breast: No palpable mass, nipple discharge, or bleeding. Left breast: Significant hyperpigmentation changes noted.  Patient continues to have a lot of induration along her lumpectomy site and some edema.  No nipple discharge or bleeding no dominant mass appreciated in the breast  Lab Findings: Lab Results  Component Value Date  WBC 8.0 09/08/2019   HGB 9.6 (L) 09/08/2019   HCT 30.4 (L) 09/08/2019   MCV 74.0 (L) 09/08/2019   PLT 297 09/08/2019    Radiographic Findings: No results found.  Impression: StagepT1a, pN0LeftBreast UIQ,Invasive DuctalCarcinoma with DCIS, ER-/ PR-/ Her2+, Grade3  No clinical evidence of recurrence on exam today.  Discussed that the patient may potentially benefit from physical therapy evaluation  concerning her breast edema and induration but at this point she would like to hold off and complete her Hercptin and see if this improves with time.  Plan: The patient is scheduled to follow-up with Dr. Lindi Adie on 11/08/2019. She will follow-up with radiation oncology in as-needed basis.  sHe continues to follow-up with Dr. Brantley Stage in addition.  Total time spent in this encounter was 15 minutes which included reviewing the patient's most recent follow-up with Dr. Lindi Adie, echocardiogram, MRI of left knee, physical examination, and documentation. ____________________________________   Blair Promise, PhD, MD  This document serves as a record of services personally performed by Gery Pray, MD. It was created on his behalf by Clerance Lav, a trained medical scribe. The creation of this record is based on the scribe's personal observations and the provider's statements to them. This document has been checked and approved by the attending provider.

## 2019-11-01 ENCOUNTER — Other Ambulatory Visit: Payer: Self-pay

## 2019-11-01 ENCOUNTER — Ambulatory Visit
Admission: RE | Admit: 2019-11-01 | Discharge: 2019-11-01 | Disposition: A | Discharge status: Home / Self Care | Payer: 59 | Source: Ambulatory Visit | Attending: Radiation Oncology | Admitting: Radiation Oncology

## 2019-11-01 ENCOUNTER — Encounter: Payer: Self-pay | Admitting: Radiation Oncology

## 2019-11-01 DIAGNOSIS — M1712 Unilateral primary osteoarthritis, left knee: Secondary | ICD-10-CM | POA: Insufficient documentation

## 2019-11-01 DIAGNOSIS — Z171 Estrogen receptor negative status [ER-]: Secondary | ICD-10-CM

## 2019-11-01 DIAGNOSIS — Z923 Personal history of irradiation: Secondary | ICD-10-CM | POA: Insufficient documentation

## 2019-11-01 DIAGNOSIS — Z86000 Personal history of in-situ neoplasm of breast: Secondary | ICD-10-CM | POA: Diagnosis present

## 2019-11-01 DIAGNOSIS — C50212 Malignant neoplasm of upper-inner quadrant of left female breast: Secondary | ICD-10-CM

## 2019-11-01 NOTE — Progress Notes (Signed)
Patient here for a f/u visit with Dr. Sondra Come.  Patient denies pain in her left breast. She reports occasionally sh e feels a knot under her left breast.  BP (!) 163/91 (BP Location: Left Arm, Patient Position: Sitting)   Pulse 87   Temp (!) 97.4 F (36.3 C) (Temporal)   Resp 18   Ht 5\' 1"  (1.549 m)   Wt 149 lb 6 oz (67.8 kg)   SpO2 98%   BMI 28.22 kg/m   Wt Readings from Last 3 Encounters:  11/01/19 149 lb 6 oz (67.8 kg)  09/27/19 153 lb 4 oz (69.5 kg)  09/08/19 152 lb 6.4 oz (69.1 kg)

## 2019-11-04 LAB — HM DIABETES EYE EXAM

## 2019-11-07 NOTE — Progress Notes (Signed)
Patient Care Team: Forrest Moron, MD as PCP - General (Internal Medicine) Mauro Kaufmann, RN as Oncology Nurse Navigator Rockwell Germany, RN as Oncology Nurse Navigator Erroll Luna, MD as Consulting Physician (General Surgery) Nicholas Lose, MD as Consulting Physician (Hematology and Oncology) Gery Pray, MD as Consulting Physician (Radiation Oncology)  DIAGNOSIS:    ICD-10-CM   1. Malignant neoplasm of upper-inner quadrant of left breast in female, estrogen receptor negative ([place])  C50.212    Z17.1     SUMMARY OF ONCOLOGIC HISTORY: Oncology History  Malignant neoplasm of upper-inner quadrant of left breast in female, estrogen receptor negative ([place])  10/26/2018 Initial Diagnosis   Patient palpated a left breast mass x 5 weeks. Mammogram showed a 2.8cm mass with calcifications spanning 3.2cm in the left breast and two mildly thickened axillary lymph nodes up to 0.4cm. Biopsy showed IDC, grade 3, HER-2 + (3+), ER/PR -, Ki67 50%, and no evidence of carcinoma in the left axilla.    10/28/2018 Cancer Staging   Staging form: Breast, AJCC 8th Edition - Clinical stage from 10/28/2018: Stage IIA (cT2, cN0, cM0, G2, ER-, PR-, HER2+) - Signed by Nicholas Lose, MD on 10/28/2018   11/12/2018 - 03/08/2019 Chemotherapy   The patient had dexamethasone (DECADRON) 4 MG tablet, 4 mg (100 % of original dose 4 mg), Oral, 2 times daily, 1 of 1 cycle, Start date: 10/28/2018, End date: 11/20/2018 Dose modification: 4 mg (original dose 4 mg, Cycle 0) palonosetron (ALOXI) injection 0.25 mg, 0.25 mg, Intravenous,  Once, 6 of 6 cycles Administration: 0.25 mg (11/12/2018), 0.25 mg (12/03/2018), 0.25 mg (02/10/2019), 0.25 mg (03/05/2019), 0.25 mg (12/23/2018), 0.25 mg (01/18/2019) pegfilgrastim (NEULASTA) injection 6 mg, 6 mg, Subcutaneous, Once, 5 of 5 cycles Administration: 6 mg (12/05/2018), 6 mg (12/25/2018), 6 mg (01/20/2019), 6 mg (02/12/2019), 6 mg (03/08/2019) pegfilgrastim-jmdb (FULPHILA) injection 6  mg, 6 mg, Subcutaneous,  Once, 1 of 1 cycle Administration: 6 mg (11/14/2018) CARBOplatin (PARAPLATIN) 500 mg in sodium chloride 0.9 % 250 mL chemo infusion, 500 mg (101.8 % of original dose 493 mg), Intravenous,  Once, 6 of 6 cycles Dose modification:   (original dose 493 mg, Cycle 1, Reason: Provider Judgment) Administration: 500 mg (11/12/2018), 420 mg (12/03/2018), 420 mg (02/10/2019), 420 mg (03/05/2019), 420 mg (12/23/2018), 420 mg (01/18/2019) DOCEtaxel (TAXOTERE) 110 mg in sodium chloride 0.9 % 250 mL chemo infusion, 65 mg/m2 = 110 mg (100 % of original dose 65 mg/m2), Intravenous,  Once, 6 of 6 cycles Dose modification: 65 mg/m2 (original dose 65 mg/m2, Cycle 1, Reason: Provider Judgment), 50 mg/m2 (original dose 65 mg/m2, Cycle 2, Reason: Dose not tolerated), 40 mg/m2 (original dose 65 mg/m2, Cycle 5, Reason: Dose not tolerated) Administration: 110 mg (11/12/2018), 80 mg (12/03/2018), 70 mg (02/10/2019), 70 mg (03/05/2019), 70 mg (12/23/2018), 70 mg (01/18/2019) pertuzumab (PERJETA) 420 mg in sodium chloride 0.9 % 250 mL chemo infusion, 420 mg (100 % of original dose 420 mg), Intravenous, Once, 6 of 6 cycles Dose modification: 420 mg (original dose 420 mg, Cycle 1, Reason: Provider Judgment) Administration: 420 mg (11/12/2018), 420 mg (12/03/2018), 420 mg (02/10/2019), 420 mg (03/05/2019), 420 mg (12/23/2018), 420 mg (01/18/2019) fosaprepitant (EMEND) 150 mg, dexamethasone (DECADRON) 12 mg in sodium chloride 0.9 % 145 mL IVPB, , Intravenous,  Once, 6 of 6 cycles Administration:  (11/12/2018),  (12/03/2018),  (02/10/2019),  (03/05/2019),  (12/23/2018),  (01/18/2019) trastuzumab-anns (KANJINTI) 399 mg in sodium chloride 0.9 % 250 mL chemo infusion, 6 mg/kg = 399 mg (100 %  of original dose 6 mg/kg), Intravenous,  Once, 5 of 5 cycles Dose modification: 6 mg/kg (original dose 6 mg/kg, Cycle 2, Reason: Other (see comments), Comment: J-code in auth referral is for Kanjinti) Administration: 399 mg (12/03/2018), 399 mg  (12/23/2018), 399 mg (01/18/2019), 399 mg (02/10/2019), 399 mg (03/05/2019) trastuzumab-dkst (OGIVRI) 525 mg in sodium chloride 0.9 % 250 mL chemo infusion, 8 mg/kg = 525 mg, Intravenous,  Once, 1 of 1 cycle Administration: 525 mg (11/12/2018)  for chemotherapy treatment.    04/02/2019 -  Chemotherapy   The patient had pertuzumab (PERJETA) 420 mg in sodium chloride 0.9 % 250 mL chemo infusion, 420 mg (100 % of original dose 420 mg), Intravenous, Once, 9 of 11 cycles Dose modification: 420 mg (original dose 420 mg, Cycle 1, Reason: Provider Judgment) Administration: 420 mg (04/02/2019), 420 mg (04/29/2019), 420 mg (07/05/2019), 420 mg (05/20/2019), 420 mg (06/16/2019), 420 mg (07/28/2019), 420 mg (08/16/2019), 420 mg (09/08/2019), 420 mg (09/27/2019) trastuzumab-anns (KANJINTI) 378 mg in sodium chloride 0.9 % 250 mL chemo infusion, 6 mg/kg = 378 mg (100 % of original dose 6 mg/kg), Intravenous,  Once, 8 of 10 cycles Dose modification: 6 mg/kg (original dose 6 mg/kg, Cycle 2, Reason: Other (see comments), Comment: Preferred biosimilar ) Administration: 378 mg (04/29/2019), 378 mg (05/20/2019), 378 mg (06/16/2019), 378 mg (07/05/2019), 378 mg (07/28/2019), 378 mg (08/16/2019), 378 mg (09/08/2019), 378 mg (09/27/2019) trastuzumab-dkst (OGIVRI) 399 mg in sodium chloride 0.9 % 250 mL chemo infusion, 378 mg (100 % of original dose 6 mg/kg), Intravenous,  Once, 1 of 1 cycle Dose modification: 6 mg/kg (original dose 6 mg/kg, Cycle 1, Reason: Provider Judgment) Administration: 399 mg (04/02/2019)  for chemotherapy treatment.    04/21/2019 Surgery   Left lumpectomy ([facility]): multiple foci of residual carcinoma (0.25 cm max), clear margins, 4 left axillary lymph nodes negative.    05/25/2019 - 06/22/2019 Radiation Therapy   Adjuvant radiation     CHIEF COMPLIANT: Herceptin Perjeta maintenance  INTERVAL HISTORY: Erin Lawson is a 59 y.o. with above-mentioned history of left breast cancerwho completedneoadjuvant  chemotherapy,underwent a lumpectomy, radiation, and is currently on Herceptin Perjeta maintenance.Echo on 09/14/19 showed an ejection fraction of 55-60%. She presents to the clinic todayfortreatment. Today is her last treatment.  She has an appointment to get the port removed next month.  ALLERGIES:  is allergic to atenolol, poractant alfa, pork-derived products, and meloxicam.  MEDICATIONS:  Current Outpatient Medications  Medication Sig Dispense Refill  . blood glucose meter kit and supplies KIT Dispense based on patient and insurance preference. Use up to four times daily as directed. (FOR ICD-9 250.00, 250.01). 1 each 0  . blood glucose meter kit and supplies Dispense based on patient and insurance preference. Use up to four times daily as directed. (FOR ICD-10 E10.9, E11.9). 1 each 0  . glucose blood (CONTOUR NEXT TEST) test strip Check sugar once daily  Dx:  DMII controlled non-insulin dependent. 100 each 3  . LANCETS ULTRA FINE MISC Check sugar up one time daily dx: new onset DMII uncontrolled non-insulin 100 each 3  . magic mouthwash w/lidocaine SOLN Take 5 mLs by mouth 3 (three) times daily as needed for mouth pain. (Patient not taking: Reported on 07/29/2019) 100 mL 0   No current facility-administered medications for this visit.   Facility-Administered Medications Ordered in Other Visits  Medication Dose Route Frequency Provider Last Rate Last Admin  . sodium chloride flush (NS) 0.9 % injection 10 mL  10 mL Intracatheter PRN Shivaun Bilello,  Brendan Gadson, MD   10 mL at 11/08/19 1121    PHYSICAL EXAMINATION: ECOG PERFORMANCE STATUS: 1 - Symptomatic but completely ambulatory  There were no vitals filed for this visit. There were no vitals filed for this visit.   LABORATORY DATA:  I have reviewed the data as listed CMP Latest Ref Rng & Units 09/08/2019 06/16/2019 04/29/2019  Glucose 70 - 99 mg/dL 208(H) 125(H) 138(H)  BUN 6 - 20 mg/dL $Remove'15 15 16  'XngsSmr$ Creatinine 0.44 - 1.00 mg/dL 0.79 0.75 0.78   Sodium 135 - 145 mmol/L 137 142 142  Potassium 3.5 - 5.1 mmol/L 4.0 3.6 3.6  Chloride 98 - 111 mmol/L 104 108 107  CO2 22 - 32 mmol/L $RemoveB'25 23 25  'dfwDiLYe$ Calcium 8.9 - 10.3 mg/dL 10.1 9.4 9.3  Total Protein 6.5 - 8.1 g/dL 7.0 6.9 7.3  Total Bilirubin 0.3 - 1.2 mg/dL 0.3 0.4 0.4  Alkaline Phos 38 - 126 U/L 151(H) 138(H) 138(H)  AST 15 - 41 U/L 18 17 13(L)  ALT 0 - 44 U/L $Remo'19 17 11    'ElaPR$ Lab Results  Component Value Date   WBC 8.0 09/08/2019   HGB 9.6 (L) 09/08/2019   HCT 30.4 (L) 09/08/2019   MCV 74.0 (L) 09/08/2019   PLT 297 09/08/2019   NEUTROABS 4.5 09/08/2019    ASSESSMENT & PLAN:  Malignant neoplasm of upper-inner quadrant of left breast in female, estrogen receptor negative ([place]) 10/26/2018:Patient palpated a left breast mass x 5 weeks. Mammogram showed a 2.8cm mass with calcifications spanning 3.2cm in the left breast and two mildly thickened axillary lymph nodes up to 0.4cm. Biopsy showed IDC, grade 3, HER-2 + (3+), ER/PR -, Ki67 50%, and no evidence of carcinoma in the left axilla.  Treatment plan: 1. Neoadjuvant chemotherapy with [place] Perjeta 6 cycles(completed on 03/05/19)followed by HerceptinPerjetamaintenance for 1 year  2. 04/21/2019:Left lumpectomy ([facility]): multiple foci of residual carcinoma(0.25 cm in greatest dimension), clear margins, 4 left axillary lymph nodes negative.Repeat prognostic panel ER negative PR negative, Ki-67 40%, HER-2 positive 3. Followed by adjuvant radiation therapy ------------------------------------------------------------------------------------------------------------------------------------------------------------- Current treatment: Maintenance Herceptin Perjeta(to be completed October 2021)and adjuvant radiation Toxicities of Herceptin Perjeta:Tolerating it well without any problems or concerns. Radiation dermatitis:Markedly better.  Patient continues to work full-time in [employer] system.   Today is her last  treatment Return to clinic in 6 months for follow-up    No orders of the defined types were placed in this encounter.  The patient has a good understanding of the overall plan. she agrees with it. she will call with any problems that may develop before the next visit here.  Total time spent: 30 mins including face to face time and time spent for planning, charting and coordination of care  Nicholas Lose, MD 11/08/2019  I, Cloyde Reams Dorshimer, am acting as scribe for Dr. Nicholas Lose.  I have reviewed the above documentation for accuracy and completeness, and I agree with the above.

## 2019-11-08 ENCOUNTER — Inpatient Hospital Stay: Payer: 59

## 2019-11-08 ENCOUNTER — Inpatient Hospital Stay (HOSPITAL_BASED_OUTPATIENT_CLINIC_OR_DEPARTMENT_OTHER): Payer: 59 | Admitting: Hematology and Oncology

## 2019-11-08 ENCOUNTER — Encounter: Payer: Self-pay | Admitting: *Deleted

## 2019-11-08 ENCOUNTER — Other Ambulatory Visit: Payer: Self-pay

## 2019-11-08 ENCOUNTER — Other Ambulatory Visit: Payer: Self-pay | Admitting: *Deleted

## 2019-11-08 ENCOUNTER — Inpatient Hospital Stay: Payer: 59 | Attending: Hematology and Oncology

## 2019-11-08 DIAGNOSIS — C50212 Malignant neoplasm of upper-inner quadrant of left female breast: Secondary | ICD-10-CM

## 2019-11-08 DIAGNOSIS — Z171 Estrogen receptor negative status [ER-]: Secondary | ICD-10-CM

## 2019-11-08 DIAGNOSIS — Z5112 Encounter for antineoplastic immunotherapy: Secondary | ICD-10-CM | POA: Diagnosis present

## 2019-11-08 DIAGNOSIS — Z95828 Presence of other vascular implants and grafts: Secondary | ICD-10-CM

## 2019-11-08 LAB — CBC WITH DIFFERENTIAL (CANCER CENTER ONLY)
Abs Immature Granulocytes: 0.05 10*3/uL (ref 0.00–0.07)
Basophils Absolute: 0 10*3/uL (ref 0.0–0.1)
Basophils Relative: 0 %
Eosinophils Absolute: 0.2 10*3/uL (ref 0.0–0.5)
Eosinophils Relative: 2 %
HCT: 32 % — ABNORMAL LOW (ref 36.0–46.0)
Hemoglobin: 10.5 g/dL — ABNORMAL LOW (ref 12.0–15.0)
Immature Granulocytes: 1 %
Lymphocytes Relative: 35 %
Lymphs Abs: 2.8 10*3/uL (ref 0.7–4.0)
MCH: 23.9 pg — ABNORMAL LOW (ref 26.0–34.0)
MCHC: 32.8 g/dL (ref 30.0–36.0)
MCV: 72.9 fL — ABNORMAL LOW (ref 80.0–100.0)
Monocytes Absolute: 0.5 10*3/uL (ref 0.1–1.0)
Monocytes Relative: 6 %
Neutro Abs: 4.4 10*3/uL (ref 1.7–7.7)
Neutrophils Relative %: 56 %
Platelet Count: 337 10*3/uL (ref 150–400)
RBC: 4.39 MIL/uL (ref 3.87–5.11)
RDW: 13.8 % (ref 11.5–15.5)
WBC Count: 7.9 10*3/uL (ref 4.0–10.5)
nRBC: 0 % (ref 0.0–0.2)

## 2019-11-08 LAB — CMP (CANCER CENTER ONLY)
ALT: 20 U/L (ref 0–44)
AST: 18 U/L (ref 15–41)
Albumin: 4.2 g/dL (ref 3.5–5.0)
Alkaline Phosphatase: 148 U/L — ABNORMAL HIGH (ref 38–126)
Anion gap: 8 (ref 5–15)
BUN: 19 mg/dL (ref 6–20)
CO2: 25 mmol/L (ref 22–32)
Calcium: 9.8 mg/dL (ref 8.9–10.3)
Chloride: 103 mmol/L (ref 98–111)
Creatinine: 0.83 mg/dL (ref 0.44–1.00)
GFR, Estimated: >60 mL/min (ref >60)
Glucose, Bld: 232 mg/dL — ABNORMAL HIGH (ref 70–99)
Potassium: 3.6 mmol/L (ref 3.5–5.1)
Sodium: 136 mmol/L (ref 135–145)
Total Bilirubin: 0.3 mg/dL (ref 0.3–1.2)
Total Protein: 7.6 g/dL (ref 6.5–8.1)

## 2019-11-08 MED ORDER — SODIUM CHLORIDE 0.9 % IV SOLN
420.0000 mg | Freq: Once | INTRAVENOUS | Status: AC
  Administered 2019-11-08: 420 mg via INTRAVENOUS
  Filled 2019-11-08: qty 14

## 2019-11-08 MED ORDER — SODIUM CHLORIDE 0.9% FLUSH
10.0000 mL | INTRAVENOUS | Status: DC | PRN
  Administered 2019-11-08: 10 mL
  Filled 2019-11-08: qty 10

## 2019-11-08 MED ORDER — ACETAMINOPHEN 325 MG PO TABS
ORAL_TABLET | ORAL | Status: AC
  Filled 2019-11-08: qty 2

## 2019-11-08 MED ORDER — ACETAMINOPHEN 325 MG PO TABS
650.0000 mg | ORAL_TABLET | Freq: Once | ORAL | Status: AC
  Administered 2019-11-08: 650 mg via ORAL

## 2019-11-08 MED ORDER — DIPHENHYDRAMINE HCL 25 MG PO CAPS
ORAL_CAPSULE | ORAL | Status: AC
  Filled 2019-11-08: qty 1

## 2019-11-08 MED ORDER — HEPARIN SOD (PORK) LOCK FLUSH 100 UNIT/ML IV SOLN
500.0000 [IU] | Freq: Once | INTRAVENOUS | Status: AC | PRN
  Administered 2019-11-08: 500 [IU]
  Filled 2019-11-08: qty 5

## 2019-11-08 MED ORDER — SODIUM CHLORIDE 0.9 % IV SOLN
Freq: Once | INTRAVENOUS | Status: AC
  Filled 2019-11-08: qty 250

## 2019-11-08 MED ORDER — TRASTUZUMAB-ANNS CHEMO 150 MG IV SOLR
6.0000 mg/kg | Freq: Once | INTRAVENOUS | Status: AC
  Administered 2019-11-08: 378 mg via INTRAVENOUS
  Filled 2019-11-08: qty 18

## 2019-11-08 MED ORDER — DIPHENHYDRAMINE HCL 25 MG PO CAPS
25.0000 mg | ORAL_CAPSULE | Freq: Once | ORAL | Status: AC
  Administered 2019-11-08: 25 mg via ORAL

## 2019-11-08 NOTE — Patient Instructions (Signed)

## 2019-11-08 NOTE — Patient Instructions (Signed)
[facility] Discharge Instructions for Patients Receiving Chemotherapy  Today you received the following chemotherapy agents: Herceptin and Perjeta  To help prevent nausea and vomiting after your treatment, we encourage you to take your nausea medication as directed by your MD.   If you develop nausea and vomiting that is not controlled by your nausea medication, call the clinic.   BELOW ARE SYMPTOMS THAT SHOULD BE REPORTED IMMEDIATELY:  *FEVER GREATER THAN 100.5 F  *CHILLS WITH OR WITHOUT FEVER  NAUSEA AND VOMITING THAT IS NOT CONTROLLED WITH YOUR NAUSEA MEDICATION  *UNUSUAL SHORTNESS OF BREATH  *UNUSUAL BRUISING OR BLEEDING  TENDERNESS IN MOUTH AND THROAT WITH OR WITHOUT PRESENCE OF ULCERS  *URINARY PROBLEMS  *BOWEL PROBLEMS  UNUSUAL RASH Items with * indicate a potential emergency and should be followed up as soon as possible.  Feel free to call the clinic should you have any questions or concerns. The clinic phone number is [phone].  Please show the [facility] at check-in to the Emergency Department and triage nurse.

## 2019-11-08 NOTE — Assessment & Plan Note (Signed)
10/26/2018:Patient palpated a left breast mass x 5 weeks. Mammogram showed a 2.8cm mass with calcifications spanning 3.2cm in the left breast and two mildly thickened axillary lymph nodes up to 0.4cm. Biopsy showed IDC, grade 3, HER-2 + (3+), ER/PR -, Ki67 50%, and no evidence of carcinoma in the left axilla.  Treatment plan: 1. Neoadjuvant chemotherapy with [place] Perjeta 6 cycles(completed on 03/05/19)followed by HerceptinPerjetamaintenance for 1 year  2. 04/21/2019:Left lumpectomy ([facility]): multiple foci of residual carcinoma(0.25 cm in greatest dimension), clear margins, 4 left axillary lymph nodes negative.Repeat prognostic panel ER negative PR negative, Ki-67 40%, HER-2 positive 3. Followed by adjuvant radiation therapy ------------------------------------------------------------------------------------------------------------------------------------------------------------- Current treatment: Maintenance Herceptin Perjeta(to be completed October 2021)and adjuvant radiation Toxicities of Herceptin Perjeta:Tolerating it well without any problems or concerns. Radiation dermatitis:Markedly better.  Patient continues to work full-time in [employer] system.   Return to clinic every 3 weeks for Herceptin Perjeta and every 6 weeks for follow-up with me.

## 2019-11-10 ENCOUNTER — Telehealth: Payer: Self-pay | Admitting: Hematology and Oncology

## 2019-11-10 NOTE — Telephone Encounter (Signed)
Scheduled per 10/25 los. Called and spoke with pt, confirmed 4/25 appt

## 2019-11-15 ENCOUNTER — Encounter: Payer: Self-pay | Admitting: *Deleted

## 2019-11-28 ENCOUNTER — Other Ambulatory Visit: Payer: Self-pay

## 2019-11-28 ENCOUNTER — Emergency Department (HOSPITAL_COMMUNITY)
Admission: EM | Admit: 2019-11-28 | Discharge: 2019-11-28 | Disposition: A | Discharge status: Home / Self Care | Payer: 59 | Attending: Emergency Medicine | Admitting: Emergency Medicine

## 2019-11-28 ENCOUNTER — Encounter (HOSPITAL_COMMUNITY): Payer: Self-pay | Admitting: Emergency Medicine

## 2019-11-28 DIAGNOSIS — Z20822 Contact with and (suspected) exposure to covid-19: Secondary | ICD-10-CM | POA: Diagnosis not present

## 2019-11-28 DIAGNOSIS — J069 Acute upper respiratory infection, unspecified: Secondary | ICD-10-CM | POA: Diagnosis not present

## 2019-11-28 DIAGNOSIS — E119 Type 2 diabetes mellitus without complications: Secondary | ICD-10-CM | POA: Insufficient documentation

## 2019-11-28 DIAGNOSIS — Z7984 Long term (current) use of oral hypoglycemic drugs: Secondary | ICD-10-CM | POA: Diagnosis not present

## 2019-11-28 DIAGNOSIS — J029 Acute pharyngitis, unspecified: Secondary | ICD-10-CM | POA: Diagnosis not present

## 2019-11-28 DIAGNOSIS — I10 Essential (primary) hypertension: Secondary | ICD-10-CM | POA: Insufficient documentation

## 2019-11-28 DIAGNOSIS — R070 Pain in throat: Secondary | ICD-10-CM | POA: Diagnosis present

## 2019-11-28 LAB — RESPIRATORY PANEL BY RT PCR (FLU A&B, COVID)
Influenza A by PCR: NEGATIVE
Influenza B by PCR: NEGATIVE
SARS Coronavirus 2 by RT PCR: NEGATIVE

## 2019-11-28 LAB — GROUP A STREP BY PCR: Group A Strep by PCR: NOT DETECTED

## 2019-11-28 MED ORDER — LORATADINE 10 MG PO TABS: 10.0000 mg | ORAL_TABLET | Freq: Every day | ORAL | 0 refills | Status: DC

## 2019-11-28 MED ORDER — FLUTICASONE PROPIONATE 50 MCG/ACT NA SUSP: 2.0000 | Freq: Every day | NASAL | 0 refills | Status: DC

## 2019-11-28 NOTE — ED Triage Notes (Signed)
Patient reports right side sore throat with swelling this evening , no cough , denies fever or chills .

## 2019-11-28 NOTE — ED Provider Notes (Signed)
[facility] EMERGENCY DEPARTMENT Provider Note   CSN: [id number] Arrival date & time: 11/28/19  0020     History Chief Complaint  Patient presents with  . Sore Throat    Erin Lawson is a 59 y.o. female.  Patient presents to the emergency department with a chief complaint of sore throat.  She states symptoms started last night.  She states that she recently attended a funeral and would like to be tested for Covid.  She denies any fever, chills, cough.  She states that the throat swelling made it hard to swallow and she was concerned that it was going to block her airway.  She states that her symptoms have improved somewhat since.  She denies any treatments prior to arrival.  The history is provided by the patient. No language interpreter was used.       Past Medical History:  Diagnosis Date  . Anemia   . Anxiety   . Cancer ([place]) 10/2018   left breast IDC  . DM type 2 (diabetes mellitus, type 2) ([place]) 05/18/2012  . GERD (gastroesophageal reflux disease)    OTC prn  . Hypertension     Patient Active Problem List   Diagnosis Date Noted  . Port-A-Cath in place 12/23/2018  . Malignant neoplasm of upper-inner quadrant of left breast in female, estrogen receptor negative ([place]) 10/26/2018  . Vitamin D deficiency 12/12/2017  . Primary osteoarthritis of left knee 12/12/2017  . Chest pain 05/18/2012  . Mild anemia 05/18/2012  . DM type 2 (diabetes mellitus, type 2) ([place]) 05/18/2012  . Essential hypertension 04/06/2007  . KELOID 04/06/2007    Past Surgical History:  Procedure Laterality Date  . BREAST LUMPECTOMY WITH RADIOACTIVE SEED AND SENTINEL LYMPH NODE BIOPSY Left 04/21/2019   Procedure: LEFT BREAST LUMPECTOMY WITH RADIOACTIVE SEED AND SENTINEL LYMPH NODE MAPPING;  Surgeon: Erroll Luna, MD;  Location: [facility];  Service: General;  Laterality: Left;  . CESAREAN SECTION    . PORTACATH PLACEMENT Right 11/11/2018   Procedure:  INSERTION PORT-A-CATH WITH ULTRASOUND;  Surgeon: Erroll Luna, MD;  Location: [facility];  Service: General;  Laterality: Right;  . utreine polyp     resected     OB History   No obstetric history on file.     Family History  Problem Relation Age of Onset  . Hypertension Sister   . Heart attack Neg Hx     Social History   Tobacco Use  . Smoking status: Never Smoker  . Smokeless tobacco: Never Used  Vaping Use  . Vaping Use: Never assessed  Substance Use Topics  . Alcohol use: No  . Drug use: No    Home Medications Prior to Admission medications   Medication Sig Start Date End Date Taking? Authorizing Provider  blood glucose meter kit and supplies KIT Dispense based on patient and insurance preference. Use up to four times daily as directed. (FOR ICD-9 250.00, 250.01). 09/24/16   Wardell Honour, MD  blood glucose meter kit and supplies Dispense based on patient and insurance preference. Use up to four times daily as directed. (FOR ICD-10 E10.9, E11.9). 07/21/18   Delia Chimes A, MD  glucose blood (CONTOUR NEXT TEST) test strip Check sugar once daily  Dx:  DMII controlled non-insulin dependent. 08/04/17   Wardell Honour, MD  LANCETS ULTRA FINE MISC Check sugar up one time daily dx: new onset DMII uncontrolled non-insulin 08/04/17   Wardell Honour, MD  magic mouthwash w/lidocaine SOLN Take 5 mLs by mouth 3 (three) times daily as needed for mouth pain. Patient not taking: Reported on 07/29/2019 04/29/19   Nicholas Lose, MD  prochlorperazine (COMPAZINE) 10 MG tablet Take 1 tablet (10 mg total) by mouth every 6 (six) hours as needed (Nausea or vomiting). Patient not taking: Reported on 03/24/2019 10/28/18 04/02/19  Nicholas Lose, MD  sitaGLIPtin-metformin (JANUMET) 50-1000 MG tablet Take 1 tablet by mouth 2 (two) times daily with a meal. 09/17/18 09/23/18  Forrest Moron, MD    Allergies    Atenolol, Poractant alfa, Pork-derived products, and Meloxicam  Review of  Systems   Review of Systems  All other systems reviewed and are negative.   Physical Exam Updated Vital Signs BP (!) 169/77   Pulse 95   Temp 98.6 F (37 C) (Oral)   Resp 18   Ht '5\' 1"'  (1.549 m)   Wt 72 kg   SpO2 98%   BMI 29.99 kg/m   Physical Exam Vitals and nursing note reviewed.  Constitutional:      General: She is not in acute distress.    Appearance: She is well-developed.  HENT:     Head: Normocephalic and atraumatic.     Mouth/Throat:     Comments: Oropharynx is erythematous There is no tonsillar exudate, peritonsillar abscess, or evidence of significant swelling.  There is no stridor.  Normal phonation Eyes:     Conjunctiva/sclera: Conjunctivae normal.  Cardiovascular:     Rate and Rhythm: Normal rate and regular rhythm.     Heart sounds: No murmur heard.   Pulmonary:     Effort: Pulmonary effort is normal. No respiratory distress.     Breath sounds: Normal breath sounds. No stridor.  Abdominal:     Palpations: Abdomen is soft.     Tenderness: There is no abdominal tenderness.  Musculoskeletal:        General: Normal range of motion.     Cervical back: Neck supple.  Lymphadenopathy:     Cervical: Cervical adenopathy present.  Skin:    General: Skin is warm and dry.  Neurological:     Mental Status: She is alert and oriented to person, place, and time.  Psychiatric:        Mood and Affect: Mood normal.        Behavior: Behavior normal.     ED Results / Procedures / Treatments   Labs (all labs ordered are listed, but only abnormal results are displayed) Labs Reviewed  GROUP A STREP BY PCR  RESPIRATORY PANEL BY RT PCR (FLU A&B, COVID)    EKG None  Radiology No results found.  Procedures Procedures (including critical care time)  Medications Ordered in ED Medications - No data to display  ED Course  I have reviewed the triage vital signs and the nursing notes.  Pertinent labs & imaging results that were available during my care  of the patient were reviewed by me and considered in my medical decision making (see chart for details).    MDM Rules/Calculators/A&P                          Patient is a well-appearing 59 year old female with complaint of sore throat.  She does have a mild to moderately erythematous oropharynx, but no exudates, no abscess, no stridor.  She has normal voice.  She also complains of postnasal drip, runny nose, and sinus congestion.  I will prescribe her Flonase  and Claritin.  I do not think that she needs any further work-up or evaluation tonight.  She is in no acute distress. Final Clinical Impression(s) / ED Diagnoses Final diagnoses:  Viral pharyngitis  Upper respiratory tract infection, unspecified type    Rx / DC Orders ED Discharge Orders         Ordered    fluticasone (FLONASE) 50 MCG/ACT nasal spray  Daily        11/28/19 0615    loratadine (CLARITIN) 10 MG tablet  Daily        11/28/19 0615           Montine Circle, PA-C 29/42/62 7004    Delora Fuel, MD 84/98/65 [phone]

## 2019-11-28 NOTE — Discharge Instructions (Signed)
Your strep test was negative.  We will call you if your Covid test is positive.  No news equals good news.  Use the medication as prescribed.  If your symptoms change or worsen, return to the emergency department.

## 2019-12-02 ENCOUNTER — Ambulatory Visit: Payer: 59 | Admitting: Family Medicine

## 2019-12-02 ENCOUNTER — Other Ambulatory Visit: Payer: Self-pay

## 2019-12-02 ENCOUNTER — Encounter: Payer: Self-pay | Admitting: Family Medicine

## 2019-12-02 VITALS — BP 152/82 | HR 84 | Temp 98.1°F | Resp 15 | Ht 61.0 in | Wt 146.4 lb

## 2019-12-02 DIAGNOSIS — I10 Essential (primary) hypertension: Secondary | ICD-10-CM

## 2019-12-02 DIAGNOSIS — J329 Chronic sinusitis, unspecified: Secondary | ICD-10-CM | POA: Diagnosis not present

## 2019-12-02 DIAGNOSIS — J310 Chronic rhinitis: Secondary | ICD-10-CM | POA: Diagnosis not present

## 2019-12-02 DIAGNOSIS — R059 Cough, unspecified: Secondary | ICD-10-CM | POA: Diagnosis not present

## 2019-12-02 MED ORDER — BENZONATATE 100 MG PO CAPS: 100.0000 mg | ORAL_CAPSULE | Freq: Three times a day (TID) | ORAL | 0 refills | Status: DC | PRN

## 2019-12-02 MED ORDER — AMOXICILLIN-POT CLAVULANATE 875-125 MG PO TABS: 1.0000 | ORAL_TABLET | Freq: Two times a day (BID) | ORAL | 0 refills | Status: DC

## 2019-12-02 NOTE — Progress Notes (Signed)
Patient ID: Erin Lawson, female    DOB: [birth year]  Age: 59 y.o. MRN: [id number]  Chief Complaint  Patient presents with  . Nasal Congestion    pt reports saturday got congestion, sinus drainage, had COVID test at ER saturday was negative. pt reports was given nasal spray, helps open sinus but at night becomes uncomfortably congested again, also notes has not had COVID vaccine does not want the flu or pneumonia vaccines   . Hypertension    pt had 2 elevated BP on arival to office today pt reports always high when going to Dr office but 130/80 at home typically     Subjective:   59 year old lady with a respiratory tract infection for the past week.  Last weekend she went to the emergency room and strep test was negative.  She was treated for cold with Claritin and Flonase.  She has persisted.  She has had drainage which is purulent when she blows her nose.  She is coughing up yellow mucus.  She has just not gotten better.  She has a mild headache.  Right side of her neck has been a little uncomfortable anteriorly, hurting when she coughs.  She did recently had a port taken out of her right upper chest wall.  Has been off of work almost 2 weeks because of that procedure.  She works as a Corporate treasurer.  She has gotten her Covid test twice in the past 2 weeks.  She has had Covid vaccines.  Current allergies, medications, problem list, past/family and social histories reviewed.  Objective:  BP (!) 152/82   Pulse 84   Temp 98.1 F (36.7 C) (Temporal)   Resp 15   Ht 5\' 1"  (1.549 m)   Wt 146 lb 6.4 oz (66.4 kg)   SpO2 96%   BMI 27.66 kg/m   No acute distress but is coughing.  TMs normal.  Throat not erythematous.  Sinuses nontender.  Neck supple with mild tenderness in the right side but no lymphadenopathy.  Chest is clear to auscultation.  Heart regular without murmur.  Blood pressure was borderline high when she came in today.  Assessment & Plan:   Assessment: 1.  Rhinosinusitis   2. Cough   3. Essential hypertension       Plan: See instructions  No orders of the defined types were placed in this encounter.   Meds ordered this encounter  Medications  . amoxicillin-clavulanate (AUGMENTIN) 875-125 MG tablet    Sig: Take 1 tablet by mouth 2 (two) times daily.    Dispense:  20 tablet    Refill:  0  . benzonatate (TESSALON) 100 MG capsule    Sig: Take 1-2 capsules (100-200 mg total) by mouth 3 (three) times daily as needed.    Dispense:  30 capsule    Refill:  0         Patient Instructions     Drink plenty of fluids to help keep the mucus loose  Continue taking the Claritin and the nose spray  Take Augmentin (amoxicillin/clavulanate) 1 twice daily for antibiotic  Use the benzonatate cough pills 1 or 2 pills 3 times daily as needed for cough.  I recommend you stay off work through tomorrow to give the medicines time to start working before you return to work.  If you end up needing to take off longer I could extend that if necessary.  Return as needed  If you have lab work done today you will be contacted  with your lab results within the next 2 weeks.  If you have not heard from [name] then please contact us. The fastest way to get your results is to register for My Chart.   IF you received an x-ray today, you will receive an invoice from [facility]. Please contact [facility] at [phone] with questions or concerns regarding your invoice.   IF you received labwork today, you will receive an invoice from [facility]. Please contact [facility] at [phone] with questions or concerns regarding your invoice.   Our billing staff will not be able to assist you with questions regarding bills from these companies.  You will be contacted with the lab results as soon as they are available. The fastest way to get your results is to activate your My Chart account. Instructions are located on the last page of this  paperwork. If you have not heard from [name] regarding the results in 2 weeks, please contact this office.        Return if symptoms worsen or fail to improve.   Ruben Reason, MD 12/02/2019

## 2019-12-02 NOTE — Patient Instructions (Addendum)
   Drink plenty of fluids to help keep the mucus loose  Continue taking the Claritin and the nose spray  Take Augmentin (amoxicillin/clavulanate) 1 twice daily for antibiotic  Use the benzonatate cough pills 1 or 2 pills 3 times daily as needed for cough.  I recommend you stay off work through tomorrow to give the medicines time to start working before you return to work.  If you end up needing to take off longer I could extend that if necessary.  Return as needed  If you have lab work done today you will be contacted with your lab results within the next 2 weeks.  If you have not heard from [name] then please contact us. The fastest way to get your results is to register for My Chart.   IF you received an x-ray today, you will receive an invoice from [facility]. Please contact [facility] at [phone] with questions or concerns regarding your invoice.   IF you received labwork today, you will receive an invoice from [facility]. Please contact [facility] at [phone] with questions or concerns regarding your invoice.   Our billing staff will not be able to assist you with questions regarding bills from these companies.  You will be contacted with the lab results as soon as they are available. The fastest way to get your results is to activate your My Chart account. Instructions are located on the last page of this paperwork. If you have not heard from [name] regarding the results in 2 weeks, please contact this office.

## 2020-05-08 ENCOUNTER — Encounter (HOSPITAL_COMMUNITY): Payer: Self-pay

## 2020-05-08 ENCOUNTER — Inpatient Hospital Stay: Payer: 59 | Admitting: Hematology and Oncology

## 2020-05-17 NOTE — Assessment & Plan Note (Signed)
10/26/2018:Patient palpated a left breast mass x 5 weeks. Mammogram showed a 2.8cm mass with calcifications spanning 3.2cm in the left breast and two mildly thickened axillary lymph nodes up to 0.4cm. Biopsy showed IDC, grade 3, HER-2 + (3+), ER/PR -, Ki67 50%, and no evidence of carcinoma in the left axilla.  Treatment plan: 1. Neoadjuvant chemotherapy with [place] Perjeta 6 cycles(completed on 03/05/19)followed by HerceptinPerjetamaintenance for 1 year  2. 04/21/2019:Left lumpectomy ([facility]): multiple foci of residual carcinoma(0.25 cm in greatest dimension), clear margins, 4 left axillary lymph nodes negative.Repeat prognostic panel ER negative PR negative, Ki-67 40%, HER-2 positive 3. Followed by adjuvant radiation therapy ------------------------------------------------------------------------------------------------------------------------------------------------------------- Current treatment:Maintenance Herceptin Perjeta(to be completed October 2021)and adjuvant radiation Toxicities of Herceptin Perjeta:Tolerating it well without any problems or concerns. Radiation dermatitis:Markedly better.  Patient continues to work full-time in [employer] system.   Breast Cancer Surveillance: 1. Breast Exam: 05/18/20 2. Mammograms will need to be scheduled  Return to clinic in 1 year for follow-up

## 2020-05-17 NOTE — Progress Notes (Signed)
Patient Care Team: Forrest Moron, MD as PCP - General (Internal Medicine) Mauro Kaufmann, RN as Oncology Nurse Navigator Rockwell Germany, RN as Oncology Nurse Navigator Erroll Luna, MD as Consulting Physician (General Surgery) Nicholas Lose, MD as Consulting Physician (Hematology and Oncology) Gery Pray, MD as Consulting Physician (Radiation Oncology)  DIAGNOSIS:    ICD-10-CM   1. Malignant neoplasm of upper-inner quadrant of left breast in female, estrogen receptor negative ([place])  C50.212    Z17.1     SUMMARY OF ONCOLOGIC HISTORY: Oncology History  Malignant neoplasm of upper-inner quadrant of left breast in female, estrogen receptor negative ([place])  10/26/2018 Initial Diagnosis   Patient palpated a left breast mass x 5 weeks. Mammogram showed a 2.8cm mass with calcifications spanning 3.2cm in the left breast and two mildly thickened axillary lymph nodes up to 0.4cm. Biopsy showed IDC, grade 3, HER-2 + (3+), ER/PR -, Ki67 50%, and no evidence of carcinoma in the left axilla.    10/28/2018 Cancer Staging   Staging form: Breast, AJCC 8th Edition - Clinical stage from 10/28/2018: Stage IIA (cT2, cN0, cM0, G2, ER-, PR-, HER2+) - Signed by Nicholas Lose, MD on 10/28/2018   11/12/2018 - 03/08/2019 Chemotherapy   The patient had dexamethasone (DECADRON) 4 MG tablet, 4 mg (100 % of original dose 4 mg), Oral, 2 times daily, 1 of 1 cycle, Start date: 10/28/2018, End date: 11/20/2018 Dose modification: 4 mg (original dose 4 mg, Cycle 0) palonosetron (ALOXI) injection 0.25 mg, 0.25 mg, Intravenous,  Once, 6 of 6 cycles Administration: 0.25 mg (11/12/2018), 0.25 mg (12/03/2018), 0.25 mg (02/10/2019), 0.25 mg (03/05/2019), 0.25 mg (12/23/2018), 0.25 mg (01/18/2019) pegfilgrastim (NEULASTA) injection 6 mg, 6 mg, Subcutaneous, Once, 5 of 5 cycles Administration: 6 mg (12/05/2018), 6 mg (12/25/2018), 6 mg (01/20/2019), 6 mg (02/12/2019), 6 mg (03/08/2019) pegfilgrastim-jmdb (FULPHILA) injection 6  mg, 6 mg, Subcutaneous,  Once, 1 of 1 cycle Administration: 6 mg (11/14/2018) CARBOplatin (PARAPLATIN) 500 mg in sodium chloride 0.9 % 250 mL chemo infusion, 500 mg (101.8 % of original dose 493 mg), Intravenous,  Once, 6 of 6 cycles Dose modification:   (original dose 493 mg, Cycle 1, Reason: Provider Judgment) Administration: 500 mg (11/12/2018), 420 mg (12/03/2018), 420 mg (02/10/2019), 420 mg (03/05/2019), 420 mg (12/23/2018), 420 mg (01/18/2019) DOCEtaxel (TAXOTERE) 110 mg in sodium chloride 0.9 % 250 mL chemo infusion, 65 mg/m2 = 110 mg (100 % of original dose 65 mg/m2), Intravenous,  Once, 6 of 6 cycles Dose modification: 65 mg/m2 (original dose 65 mg/m2, Cycle 1, Reason: Provider Judgment), 50 mg/m2 (original dose 65 mg/m2, Cycle 2, Reason: Dose not tolerated), 40 mg/m2 (original dose 65 mg/m2, Cycle 5, Reason: Dose not tolerated) Administration: 110 mg (11/12/2018), 80 mg (12/03/2018), 70 mg (02/10/2019), 70 mg (03/05/2019), 70 mg (12/23/2018), 70 mg (01/18/2019) pertuzumab (PERJETA) 420 mg in sodium chloride 0.9 % 250 mL chemo infusion, 420 mg (100 % of original dose 420 mg), Intravenous, Once, 6 of 6 cycles Dose modification: 420 mg (original dose 420 mg, Cycle 1, Reason: Provider Judgment) Administration: 420 mg (11/12/2018), 420 mg (12/03/2018), 420 mg (02/10/2019), 420 mg (03/05/2019), 420 mg (12/23/2018), 420 mg (01/18/2019) fosaprepitant (EMEND) 150 mg, dexamethasone (DECADRON) 12 mg in sodium chloride 0.9 % 145 mL IVPB, , Intravenous,  Once, 6 of 6 cycles Administration:  (11/12/2018),  (12/03/2018),  (02/10/2019),  (03/05/2019),  (12/23/2018),  (01/18/2019) trastuzumab-anns (KANJINTI) 399 mg in sodium chloride 0.9 % 250 mL chemo infusion, 6 mg/kg = 399 mg (100 %  of original dose 6 mg/kg), Intravenous,  Once, 5 of 5 cycles Dose modification: 6 mg/kg (original dose 6 mg/kg, Cycle 2, Reason: Other (see comments), Comment: J-code in auth referral is for Kanjinti) Administration: 399 mg (12/03/2018), 399 mg  (12/23/2018), 399 mg (01/18/2019), 399 mg (02/10/2019), 399 mg (03/05/2019) trastuzumab-dkst (OGIVRI) 525 mg in sodium chloride 0.9 % 250 mL chemo infusion, 8 mg/kg = 525 mg, Intravenous,  Once, 1 of 1 cycle Administration: 525 mg (11/12/2018)  for chemotherapy treatment.    04/02/2019 - 11/08/2019 Chemotherapy         04/21/2019 Surgery   Left lumpectomy ([facility]): multiple foci of residual carcinoma (0.25 cm max), clear margins, 4 left axillary lymph nodes negative.    05/25/2019 - 06/22/2019 Radiation Therapy   Adjuvant radiation     CHIEF COMPLIANT: Surveillance of left breast cancer   INTERVAL HISTORY: Erin Lawson is a 60 y.o. with above-mentioned history of left breast cancerwho completedneoadjuvant chemotherapy,underwent a lumpectomy, radiation, and completed Herceptin Perjeta maintenance.She presents to the clinic today for follow-up.  ALLERGIES:  is allergic to atenolol, poractant alfa, pork-derived products, and meloxicam.  MEDICATIONS:  Current Outpatient Medications  Medication Sig Dispense Refill  . amoxicillin-clavulanate (AUGMENTIN) 875-125 MG tablet Take 1 tablet by mouth 2 (two) times daily. 20 tablet 0  . benzonatate (TESSALON) 100 MG capsule Take 1-2 capsules (100-200 mg total) by mouth 3 (three) times daily as needed. 30 capsule 0  . blood glucose meter kit and supplies KIT Dispense based on patient and insurance preference. Use up to four times daily as directed. (FOR ICD-9 250.00, 250.01). 1 each 0  . blood glucose meter kit and supplies Dispense based on patient and insurance preference. Use up to four times daily as directed. (FOR ICD-10 E10.9, E11.9). 1 each 0  . fluticasone (FLONASE) 50 MCG/ACT nasal spray Place 2 sprays into both nostrils daily. 9.9 mL 0  . glucose blood (CONTOUR NEXT TEST) test strip Check sugar once daily  Dx:  DMII controlled non-insulin dependent. 100 each 3  . LANCETS ULTRA FINE MISC Check sugar up one time daily dx: new onset DMII  uncontrolled non-insulin 100 each 3  . loratadine (CLARITIN) 10 MG tablet Take 1 tablet (10 mg total) by mouth daily. 30 tablet 0  . magic mouthwash w/lidocaine SOLN Take 5 mLs by mouth 3 (three) times daily as needed for mouth pain. (Patient not taking: Reported on 07/29/2019) 100 mL 0   No current facility-administered medications for this visit.    PHYSICAL EXAMINATION: ECOG PERFORMANCE STATUS: 1 - Symptomatic but completely ambulatory  Vitals:   05/18/20 1441  BP: (!) 186/86  Pulse: 80  Resp: 18  Temp: (!) 97 F (36.1 C)  SpO2: 99%   Filed Weights   05/18/20 1441  Weight: 144 lb 6.4 oz (65.5 kg)    BREAST: No palpable masses or nodules in either right or left breasts. No palpable axillary supraclavicular or infraclavicular adenopathy no breast tenderness or nipple discharge. (exam performed in the presence of a chaperone)  LABORATORY DATA:  I have reviewed the data as listed CMP Latest Ref Rng & Units 11/08/2019 09/08/2019 06/16/2019  Glucose 70 - 99 mg/dL 232(H) 208(H) 125(H)  BUN 6 - 20 mg/dL _0 Creatinine 0.44 - 1.00 mg/dL 0.83 0.79 0.75  Sodium 135 - 145 mmol/L 136 137 142  Potassium 3.5 - 5.1 mmol/L 3.6 4.0 3.6  Chloride 98 - 111 mmol/L 103 104 108  CO2 22 - 32 mmol/L  _0 Calcium 8.9 - 10.3 mg/dL 9.8 10.1 9.4  Total Protein 6.5 - 8.1 g/dL 7.6 7.0 6.9  Total Bilirubin 0.3 - 1.2 mg/dL 0.3 0.3 0.4  Alkaline Phos 38 - 126 U/L 148(H) 151(H) 138(H)  AST 15 - 41 U/L _1 ALT 0 - 44 U/L _2 Lab Results  Component Value Date   WBC 7.9 11/08/2019   HGB 10.5 (L) 11/08/2019   HCT 32.0 (L) 11/08/2019   MCV 72.9 (L) 11/08/2019   PLT 337 11/08/2019   NEUTROABS 4.4 11/08/2019    ASSESSMENT & PLAN:  Malignant neoplasm of upper-inner quadrant of left breast in female, estrogen receptor negative ([place]) 10/26/2018:Patient palpated a left breast mass x 5 weeks. Mammogram showed a 2.8cm mass with calcifications spanning 3.2cm in the left breast and two  mildly thickened axillary lymph nodes up to 0.4cm. Biopsy showed IDC, grade 3, HER-2 + (3+), ER/PR -, Ki67 50%, and no evidence of carcinoma in the left axilla.  Treatment plan: 1. Neoadjuvant chemotherapy with [place] Perjeta 6 cycles(completed on 03/05/19)followed by HerceptinPerjetamaintenance for 1 year completed 11/08/2019 2. 04/21/2019:Left lumpectomy ([facility]): multiple foci of residual carcinoma(0.25 cm in greatest dimension), clear margins, 4 left axillary lymph nodes negative.Repeat prognostic panel ER negative PR negative, Ki-67 40%, HER-2 positive 3. Followed by adjuvant radiation therapycompleted 06/22/2019 ------------------------------------------------------------------------------------------------------------------------------------------------------------- Current treatment:Surveillance Patient continues to work full-time in [employer] system.   Breast Cancer Surveillance: 1. Breast Exam: 05/18/20 2. Mammograms will need to be scheduled  Return to clinic in 1 year for follow-up    No orders of the defined types were placed in this encounter.  The patient has a good understanding of the overall plan. she agrees with it. she will call with any problems that may develop before the next visit here.  Total time spent: 20 mins including face to face time and time spent for planning, charting and coordination of care  Rulon Eisenmenger, MD, MPH 05/18/2020  I, Cloyde Reams Dorshimer, am acting as scribe for Dr. Nicholas Lose.  I have reviewed the above documentation for accuracy and completeness, and I agree with the above.

## 2020-05-18 ENCOUNTER — Other Ambulatory Visit: Payer: Self-pay

## 2020-05-18 ENCOUNTER — Inpatient Hospital Stay: Payer: 59 | Attending: Hematology and Oncology | Admitting: Hematology and Oncology

## 2020-05-18 DIAGNOSIS — Z9221 Personal history of antineoplastic chemotherapy: Secondary | ICD-10-CM | POA: Insufficient documentation

## 2020-05-18 DIAGNOSIS — C50212 Malignant neoplasm of upper-inner quadrant of left female breast: Secondary | ICD-10-CM | POA: Diagnosis not present

## 2020-05-18 DIAGNOSIS — Z853 Personal history of malignant neoplasm of breast: Secondary | ICD-10-CM | POA: Insufficient documentation

## 2020-05-18 DIAGNOSIS — Z923 Personal history of irradiation: Secondary | ICD-10-CM | POA: Diagnosis not present

## 2020-05-18 DIAGNOSIS — E119 Type 2 diabetes mellitus without complications: Secondary | ICD-10-CM | POA: Diagnosis not present

## 2020-05-18 DIAGNOSIS — Z171 Estrogen receptor negative status [ER-]: Secondary | ICD-10-CM

## 2020-05-18 DIAGNOSIS — Z79899 Other long term (current) drug therapy: Secondary | ICD-10-CM | POA: Insufficient documentation

## 2020-06-24 ENCOUNTER — Ambulatory Visit
Admission: RE | Admit: 2020-06-24 | Discharge: 2020-06-24 | Disposition: A | Discharge status: Home / Self Care | Payer: 59 | Source: Ambulatory Visit | Attending: Hematology and Oncology | Admitting: Hematology and Oncology

## 2020-06-24 DIAGNOSIS — C50212 Malignant neoplasm of upper-inner quadrant of left female breast: Secondary | ICD-10-CM

## 2020-06-24 DIAGNOSIS — Z171 Estrogen receptor negative status [ER-]: Secondary | ICD-10-CM

## 2020-06-24 HISTORY — DX: Malignant neoplasm of unspecified site of unspecified female breast: C50.919

## 2020-06-24 HISTORY — DX: Personal history of irradiation: Z92.3

## 2020-06-24 HISTORY — DX: Personal history of antineoplastic chemotherapy: Z92.21

## 2020-08-02 ENCOUNTER — Encounter: Payer: 59 | Admitting: Internal Medicine

## 2020-08-25 ENCOUNTER — Encounter (HOSPITAL_COMMUNITY): Payer: Self-pay | Admitting: *Deleted

## 2020-08-25 ENCOUNTER — Other Ambulatory Visit: Payer: Self-pay

## 2020-08-25 ENCOUNTER — Emergency Department (HOSPITAL_COMMUNITY)
Admission: EM | Admit: 2020-08-25 | Discharge: 2020-08-25 | Disposition: A | Discharge status: Home / Self Care | Payer: 59 | Attending: Emergency Medicine | Admitting: Emergency Medicine

## 2020-08-25 DIAGNOSIS — I10 Essential (primary) hypertension: Secondary | ICD-10-CM | POA: Diagnosis not present

## 2020-08-25 DIAGNOSIS — R739 Hyperglycemia, unspecified: Secondary | ICD-10-CM

## 2020-08-25 DIAGNOSIS — Z79899 Other long term (current) drug therapy: Secondary | ICD-10-CM | POA: Insufficient documentation

## 2020-08-25 DIAGNOSIS — R42 Dizziness and giddiness: Secondary | ICD-10-CM | POA: Diagnosis present

## 2020-08-25 DIAGNOSIS — Z853 Personal history of malignant neoplasm of breast: Secondary | ICD-10-CM | POA: Insufficient documentation

## 2020-08-25 DIAGNOSIS — Z7984 Long term (current) use of oral hypoglycemic drugs: Secondary | ICD-10-CM | POA: Diagnosis not present

## 2020-08-25 DIAGNOSIS — E1165 Type 2 diabetes mellitus with hyperglycemia: Secondary | ICD-10-CM | POA: Diagnosis not present

## 2020-08-25 LAB — COMPREHENSIVE METABOLIC PANEL
ALT: 26 U/L (ref 0–44)
AST: 27 U/L (ref 15–41)
Albumin: 3.8 g/dL (ref 3.5–5.0)
Alkaline Phosphatase: 111 U/L (ref 38–126)
Anion gap: 11 (ref 5–15)
BUN: 11 mg/dL (ref 8–23)
CO2: 24 mmol/L (ref 22–32)
Calcium: 9.5 mg/dL (ref 8.9–10.3)
Chloride: 98 mmol/L (ref 98–111)
Creatinine, Ser: 0.89 mg/dL (ref 0.44–1.00)
GFR, Estimated: >60 mL/min (ref >60)
Glucose, Bld: 455 mg/dL — ABNORMAL HIGH (ref 70–99)
Potassium: 3.6 mmol/L (ref 3.5–5.1)
Sodium: 133 mmol/L — ABNORMAL LOW (ref 135–145)
Total Bilirubin: 0.4 mg/dL (ref 0.3–1.2)
Total Protein: 6.5 g/dL (ref 6.5–8.1)

## 2020-08-25 LAB — URINALYSIS, ROUTINE W REFLEX MICROSCOPIC
Bacteria, UA: NONE SEEN
Bilirubin Urine: NEGATIVE
Glucose, UA: >=500 mg/dL — AB
Hgb urine dipstick: NEGATIVE
Ketones, ur: NEGATIVE mg/dL
Nitrite: NEGATIVE
Protein, ur: NEGATIVE mg/dL
Specific Gravity, Urine: 1.027 (ref 1.005–1.030)
pH: 6 (ref 5.0–8.0)

## 2020-08-25 LAB — CBG MONITORING, ED
Glucose-Capillary: 459 mg/dL — ABNORMAL HIGH (ref 70–99)
Glucose-Capillary: 330 mg/dL — ABNORMAL HIGH (ref 70–99)
Glucose-Capillary: 287 mg/dL — ABNORMAL HIGH (ref 70–99)

## 2020-08-25 LAB — CBC WITH DIFFERENTIAL/PLATELET
Abs Immature Granulocytes: 0.06 10*3/uL (ref 0.00–0.07)
Basophils Absolute: 0.1 10*3/uL (ref 0.0–0.1)
Basophils Relative: 1 %
Eosinophils Absolute: 0.1 10*3/uL (ref 0.0–0.5)
Eosinophils Relative: 1 %
HCT: 36.6 % (ref 36.0–46.0)
Hemoglobin: 11.8 g/dL — ABNORMAL LOW (ref 12.0–15.0)
Immature Granulocytes: 1 %
Lymphocytes Relative: 40 %
Lymphs Abs: 3.1 10*3/uL (ref 0.7–4.0)
MCH: 23.8 pg — ABNORMAL LOW (ref 26.0–34.0)
MCHC: 32.2 g/dL (ref 30.0–36.0)
MCV: 73.8 fL — ABNORMAL LOW (ref 80.0–100.0)
Monocytes Absolute: 0.5 10*3/uL (ref 0.1–1.0)
Monocytes Relative: 6 %
Neutro Abs: 3.9 10*3/uL (ref 1.7–7.7)
Neutrophils Relative %: 51 %
Platelets: 359 10*3/uL (ref 150–400)
RBC: 4.96 MIL/uL (ref 3.87–5.11)
RDW: 13.5 % (ref 11.5–15.5)
WBC: 7.6 10*3/uL (ref 4.0–10.5)
nRBC: 0 % (ref 0.0–0.2)

## 2020-08-25 LAB — I-STAT VENOUS BLOOD GAS, ED
Acid-Base Excess: 2 mmol/L (ref 0.0–2.0)
Bicarbonate: 26.3 mmol/L (ref 20.0–28.0)
Calcium, Ion: 1.16 mmol/L (ref 1.15–1.40)
HCT: 36 % (ref 36.0–46.0)
Hemoglobin: 12.2 g/dL (ref 12.0–15.0)
O2 Saturation: 88 %
Potassium: 3.7 mmol/L (ref 3.5–5.1)
Sodium: 136 mmol/L (ref 135–145)
TCO2: 27 mmol/L (ref 22–32)
pCO2, Ven: 38.8 mmHg — ABNORMAL LOW (ref 44.0–60.0)
pH, Ven: 7.439 — ABNORMAL HIGH (ref 7.250–7.430)
pO2, Ven: 52 mmHg — ABNORMAL HIGH (ref 32.0–45.0)

## 2020-08-25 MED ORDER — KETOROLAC TROMETHAMINE 30 MG/ML IJ SOLN
30.0000 mg | Freq: Once | INTRAMUSCULAR | Status: AC
  Administered 2020-08-25: 30 mg via INTRAVENOUS
  Filled 2020-08-25: qty 1

## 2020-08-25 MED ORDER — METFORMIN HCL 1000 MG PO TABS: 1000.0000 mg | ORAL_TABLET | Freq: Two times a day (BID) | ORAL | 2 refills | Status: DC

## 2020-08-25 MED ORDER — METFORMIN HCL 500 MG PO TABS
1000.0000 mg | ORAL_TABLET | Freq: Once | ORAL | Status: AC
  Administered 2020-08-25: 1000 mg via ORAL
  Filled 2020-08-25: qty 2

## 2020-08-25 MED ORDER — SODIUM CHLORIDE 0.9 % IV BOLUS
1000.0000 mL | Freq: Once | INTRAVENOUS | Status: AC
  Administered 2020-08-25: 1000 mL via INTRAVENOUS

## 2020-08-25 NOTE — ED Triage Notes (Signed)
High bp and high blood sugar  after she woke up today  feeling hot and dizzy.  She has been off blood sugar medicine for 2 years while she was being treated for breast cancer with chemo therapy

## 2020-08-25 NOTE — Discharge Instructions (Addendum)
We talked about your high blood sugars today.  He may have untreated diabetes.  It is very important that you follow-up with your doctor this week as scheduled and that she look into this.  I represcribed the metformin which she can begin taking twice a day.  You will need to drink lots and lots of water to keep up with your urination.  When your blood sugars come under control, your thirst and urination will improve.

## 2020-08-25 NOTE — ED Provider Notes (Signed)
Emergency Medicine Provider Triage Evaluation Note  Erin Lawson , a 61 y.o. female  was evaluated in triage.  Pt complains of hyperglycemia. States she woke up from a nap and felt flushed. She checked her sugar and it was >500. Is currently off dm meds  Review of Systems  Positive: hyperglycemia Negative: Chest pain, sob, fevers, uri sxs, nvd  Physical Exam  BP (!) 175/83 (BP Location: Right Arm)   Pulse 75   Temp 99 F (37.2 C) (Oral)   Resp 16   Ht '5\' 1"'$  (1.549 m)   Wt 65.5 kg   SpO2 98%   BMI 27.28 kg/m  Gen:   Awake, no distress   Resp:  Normal effort  MSK:   Moves extremities without difficulty  Other:  Lungs ctab, heart with rrr, abd soft and nontender  Medical Decision Making  Medically screening exam initiated at 6:01 PM.  Appropriate orders placed.  Erin Lawson was informed that the remainder of the evaluation will be completed by another provider, this initial triage assessment does not replace that evaluation, and the importance of remaining in the ED until their evaluation is complete.     Bishop Dublin 08/25/20 1802    Hayden Rasmussen, MD 08/26/20 1055

## 2020-08-25 NOTE — ED Provider Notes (Addendum)
[facility] EMERGENCY DEPARTMENT Provider Note   CSN: [id number] Arrival date & time: 08/25/20  1725     History Chief Complaint  Patient presents with   Dizziness    Erin Lawson is a 60 y.o. female with a history of breast cancer in remission, type 2 diabetes, hypertension, presenting to the ED with an episode of vertigo.  The patient reports that she ate [employer] for lunch today.  He says she laid down for nap.  When she woke up and sat up from bed, she began to feel vertiginous, dizzy, with a mild frontal headache.  She checked her blood sugars and they are abnormally high over 500 (normally in 200's range).  She also checked her blood pressure which was higher than normal, 160/80 (normally 140/80).  She reports she is not on any oral medications or insulin for her diabetes or high blood pressure.  She reports that her blood sugars have improved on their own during chemo, she was told she does not need to be on any diabetes medicines.  She has a new PCP appointment coming up in 4 days on Tuesday.  She is adamant that she does not want to be started on it does not want received insulin in the ED.  She does report some excessive thirst and urination for the past several days.  She says she tries to drink a lot of water at home.  She denies fevers or chills.  She denies any chest pain, history of stroke, loss of vision.  She reports that her vertigo has gone away and that she no longer feels dizzy.  She does continue to have a mild frontal throbbing headache.  HPI     Past Medical History:  Diagnosis Date   Anemia    Anxiety    Breast cancer ([place])    Cancer ([place]) 10/2018   left breast IDC   DM type 2 (diabetes mellitus, type 2) ([place]) 05/18/2012   GERD (gastroesophageal reflux disease)    OTC prn   Hypertension    Personal history of chemotherapy    Personal history of radiation therapy     Patient Active Problem List   Diagnosis Date Noted    Port-A-Cath in place 12/23/2018   Malignant neoplasm of upper-inner quadrant of left breast in female, estrogen receptor negative ([place]) 10/26/2018   Vitamin D deficiency 12/12/2017   Primary osteoarthritis of left knee 12/12/2017   Chest pain 05/18/2012   Mild anemia 05/18/2012   DM type 2 (diabetes mellitus, type 2) ([place]) 05/18/2012   Essential hypertension 04/06/2007   KELOID 04/06/2007    Past Surgical History:  Procedure Laterality Date   BREAST LUMPECTOMY WITH RADIOACTIVE SEED AND SENTINEL LYMPH NODE BIOPSY Left 04/21/2019   Procedure: LEFT BREAST LUMPECTOMY WITH RADIOACTIVE SEED AND SENTINEL LYMPH NODE MAPPING;  Surgeon: Erroll Luna, MD;  Location: [facility];  Service: General;  Laterality: Left;   CESAREAN SECTION     PORTACATH PLACEMENT Right 11/11/2018   Procedure: INSERTION PORT-A-CATH WITH ULTRASOUND;  Surgeon: Erroll Luna, MD;  Location: [facility];  Service: General;  Laterality: Right;   utreine polyp     resected     OB History   No obstetric history on file.     Family History  Problem Relation Age of Onset   Hypertension Sister    Heart attack Neg Hx     Social History   Tobacco Use   Smoking status: Never  Smokeless tobacco: Never  Substance Use Topics   Alcohol use: No   Drug use: No    Home Medications Prior to Admission medications   Medication Sig Start Date End Date Taking? Authorizing Provider  metFORMIN (GLUCOPHAGE) 1000 MG tablet Take 1 tablet (1,000 mg total) by mouth 2 (two) times daily. 08/25/20 09/24/20 Yes Ranada Vigorito, Carola Rhine, MD  amoxicillin-clavulanate (AUGMENTIN) 875-125 MG tablet Take 1 tablet by mouth 2 (two) times daily. 12/02/19   Posey Boyer, MD  benzonatate (TESSALON) 100 MG capsule Take 1-2 capsules (100-200 mg total) by mouth 3 (three) times daily as needed. 12/02/19   Posey Boyer, MD  blood glucose meter kit and supplies KIT Dispense based on patient and insurance preference. Use up  to four times daily as directed. (FOR ICD-9 250.00, 250.01). 09/24/16   Wardell Honour, MD  blood glucose meter kit and supplies Dispense based on patient and insurance preference. Use up to four times daily as directed. (FOR ICD-10 E10.9, E11.9). 07/21/18   Delia Chimes A, MD  fluticasone (FLONASE) 50 MCG/ACT nasal spray Place 2 sprays into both nostrils daily. 11/28/19   Montine Circle, PA-C  glucose blood (CONTOUR NEXT TEST) test strip Check sugar once daily  Dx:  DMII controlled non-insulin dependent. 08/04/17   Wardell Honour, MD  LANCETS ULTRA FINE MISC Check sugar up one time daily dx: new onset DMII uncontrolled non-insulin 08/04/17   Wardell Honour, MD  loratadine (CLARITIN) 10 MG tablet Take 1 tablet (10 mg total) by mouth daily. 11/28/19   Montine Circle, PA-C  magic mouthwash w/lidocaine SOLN Take 5 mLs by mouth 3 (three) times daily as needed for mouth pain. Patient not taking: Reported on 07/29/2019 04/29/19   Nicholas Lose, MD  prochlorperazine (COMPAZINE) 10 MG tablet Take 1 tablet (10 mg total) by mouth every 6 (six) hours as needed (Nausea or vomiting). Patient not taking: Reported on 03/24/2019 10/28/18 04/02/19  Nicholas Lose, MD  sitaGLIPtin-metformin (JANUMET) 50-1000 MG tablet Take 1 tablet by mouth 2 (two) times daily with a meal. 09/17/18 09/23/18  Forrest Moron, MD    Allergies    Atenolol, Poractant alfa, Pork-derived products, and Meloxicam  Review of Systems   Review of Systems  Constitutional:  Negative for chills and fever.  HENT:  Negative for ear pain and sore throat.   Eyes:  Negative for pain and visual disturbance.  Respiratory:  Negative for cough and shortness of breath.   Cardiovascular:  Negative for chest pain and palpitations.  Gastrointestinal:  Negative for abdominal pain and vomiting.  Endocrine: Positive for polydipsia and polyuria.  Genitourinary:  Negative for dysuria and hematuria.  Musculoskeletal:  Negative for arthralgias and back pain.   Skin:  Negative for color change and rash.  Neurological:  Positive for headaches. Negative for seizures, syncope, facial asymmetry, weakness, light-headedness and numbness.  All other systems reviewed and are negative.  Physical Exam Updated Vital Signs BP (!) 180/83   Pulse (!) 59   Temp 99 F (37.2 C) (Oral)   Resp (!) 23   Ht '5\' 1"'  (1.549 m)   Wt 65.5 kg   SpO2 96%   BMI 27.28 kg/m   Physical Exam Constitutional:      General: She is not in acute distress. HENT:     Head: Normocephalic and atraumatic.  Eyes:     Conjunctiva/sclera: Conjunctivae normal.     Pupils: Pupils are equal, round, and reactive to light.  Cardiovascular:  Rate and Rhythm: Normal rate and regular rhythm.     Pulses: Normal pulses.  Pulmonary:     Effort: Pulmonary effort is normal. No respiratory distress.  Abdominal:     General: There is no distension.     Tenderness: There is no abdominal tenderness.  Skin:    General: Skin is warm and dry.  Neurological:     General: No focal deficit present.     Mental Status: She is alert and oriented to person, place, and time. Mental status is at baseline.     Sensory: No sensory deficit.     Motor: No weakness.  Psychiatric:        Mood and Affect: Mood normal.        Behavior: Behavior normal.    ED Results / Procedures / Treatments   Labs (all labs ordered are listed, but only abnormal results are displayed) Labs Reviewed  CBC WITH DIFFERENTIAL/PLATELET - Abnormal; Notable for the following components:      Result Value   Hemoglobin 11.8 (*)    MCV 73.8 (*)    MCH 23.8 (*)    All other components within normal limits  COMPREHENSIVE METABOLIC PANEL - Abnormal; Notable for the following components:   Sodium 133 (*)    Glucose, Bld 455 (*)    All other components within normal limits  URINALYSIS, ROUTINE W REFLEX MICROSCOPIC - Abnormal; Notable for the following components:   Color, Urine STRAW (*)    Glucose, UA >=500 (*)     Leukocytes,Ua SMALL (*)    All other components within normal limits  CBG MONITORING, ED - Abnormal; Notable for the following components:   Glucose-Capillary 459 (*)    All other components within normal limits  I-STAT VENOUS BLOOD GAS, ED - Abnormal; Notable for the following components:   pH, Ven 7.439 (*)    pCO2, Ven 38.8 (*)    pO2, Ven 52.0 (*)    All other components within normal limits  CBG MONITORING, ED - Abnormal; Notable for the following components:   Glucose-Capillary 330 (*)    All other components within normal limits  CBG MONITORING, ED - Abnormal; Notable for the following components:   Glucose-Capillary 287 (*)    All other components within normal limits    EKG EKG Interpretation  Date/Time:  Friday August 25 2020 19:07:11 EDT Ventricular Rate:  67 PR Interval:  152 QRS Duration: 76 QT Interval:  386 QTC Calculation: 408 R Axis:   6 Text Interpretation: Sinus rhythm Nonspecific T abnormalities, lateral leads No significant change from prior ecg April 2021 Confirmed by Octaviano Glow [phone]) on 08/25/2020 8:52:16 PM  Radiology No results found.  Procedures Procedures   Medications Ordered in ED Medications  metFORMIN (GLUCOPHAGE) tablet 1,000 mg (1,000 mg Oral Given 08/25/20 2142)  sodium chloride 0.9 % bolus 1,000 mL (0 mLs Intravenous Stopped 08/25/20 2302)  ketorolac (TORADOL) 30 MG/ML injection 30 mg (30 mg Intravenous Given 08/25/20 2142)    ED Course  I have reviewed the triage vital signs and the nursing notes.  Pertinent labs & imaging results that were available during my care of the patient were reviewed by me and considered in my medical decision making (see chart for details).  Patient is here with hyperglycemia and symptoms of likely worsening diabetes mellitus, ongoing for several days or weeks.  Her blood sugar was over 400 on arrival.  There is no anion gap or evidence of DKA.  She is otherwise  stable and well-appearing.  No signs or  symptoms of infection.  She is refusing insulin.  We can give her some IV fluids and restart her on metformin (she was taking this before chemo).  She will need an A1c level checked with her PCP next week, and I explained her that she may need tighter control of her diabetes.  I suspect her hypertension may also be reactive to this elevated blood sugar.  She reports an episode of positional vertigo earlier today when sitting up suddenly after a nap.  Her vertigo has gone away.  She does have a mild frontal headache, which again may be related to dehydration and diabetes.  I have a very low suspicion for bacterial meningitis.  No fever or leukocytosis, no photophobia or nuchal rigidity.  I have a low suspicion for stroke.  I suspect she either suffered from peripheral vertigo orthostatic hypotension.  I personally reviewed her EKG which shows normal sinus rhythm, no acute ischemic changes.  UA with small leukocytes, negative nitrites, loss of glucose.  No ketones.  Hemoglobin near baseline at 11.8.  White blood cell count is normal  Clinical Course as of 08/26/20 0050  Fri Aug 25, 2020  2229 Glucose-Capillary(!): 287 [MT]  2229 Glucose improved to fluids.  Patient okay for discharge. [MT]    Clinical Course User Index [MT] Jadalyn Oliveri, Carola Rhine, MD     Final Clinical Impression(s) / ED Diagnoses Final diagnoses:  Dizziness  Hyperglycemia  Hypertension, unspecified type    Rx / DC Orders ED Discharge Orders          Ordered    metFORMIN (GLUCOPHAGE) 1000 MG tablet  2 times daily        08/25/20 2153             Wyvonnia Dusky, MD 08/26/20 7618    Wyvonnia Dusky, MD 08/26/20 [phone]

## 2020-08-29 ENCOUNTER — Encounter: Payer: Self-pay | Admitting: Internal Medicine

## 2020-08-29 ENCOUNTER — Other Ambulatory Visit: Payer: Self-pay

## 2020-08-29 ENCOUNTER — Ambulatory Visit (INDEPENDENT_AMBULATORY_CARE_PROVIDER_SITE_OTHER): Payer: 59 | Admitting: Internal Medicine

## 2020-08-29 VITALS — BP 156/80 | HR 78 | Temp 98.6°F | Ht 61.0 in | Wt 143.3 lb

## 2020-08-29 DIAGNOSIS — H401190 Primary open-angle glaucoma, unspecified eye, stage unspecified: Secondary | ICD-10-CM | POA: Insufficient documentation

## 2020-08-29 DIAGNOSIS — I10 Essential (primary) hypertension: Secondary | ICD-10-CM

## 2020-08-29 DIAGNOSIS — E119 Type 2 diabetes mellitus without complications: Secondary | ICD-10-CM

## 2020-08-29 DIAGNOSIS — Z0000 Encounter for general adult medical examination without abnormal findings: Secondary | ICD-10-CM

## 2020-08-29 DIAGNOSIS — Z9189 Other specified personal risk factors, not elsewhere classified: Secondary | ICD-10-CM | POA: Insufficient documentation

## 2020-08-29 HISTORY — DX: Other specified personal risk factors, not elsewhere classified: Z91.89

## 2020-08-29 LAB — POCT GLYCOSYLATED HEMOGLOBIN (HGB A1C): Hemoglobin A1C: 12.1 % — AB (ref 4.0–5.6)

## 2020-08-29 LAB — GLUCOSE, CAPILLARY: Glucose-Capillary: 184 mg/dL — ABNORMAL HIGH (ref 70–99)

## 2020-08-29 MED ORDER — BLOOD GLUCOSE METER KIT: PACK | 0 refills | Status: DC

## 2020-08-29 NOTE — Progress Notes (Signed)
New Patient Office Visit  This is a Careers information officer Note.  The care of the patient was discussed with Dr. Laural Golden and the assessment and plan was formulated with their assistance.  Please see their note for official documentation of the patient encounter.    HPI:  Erin Lawson is a 60 y.o. female with a history of breast cancer, s/p chemoradiation and left lumpectomy, diabetes, hypertension, and glaucoma who is a new patient and presents to establish care. Her previous PCP was at the [facility], which closed. Dr. Franklyn Lor, her oncologist, manages her breast cancer and she was most recently seen in May of this year. She also saw her OB, Dr. Ophelia Charter, who diagnosed her with a yeast infection last week. Her glaucoma is managed by Dr. Gershon Crane at [facility], and she has an upcoming appointment on 8/30.   Past Surgical History:  Procedure Laterality Date   BREAST LUMPECTOMY WITH RADIOACTIVE SEED AND SENTINEL LYMPH NODE BIOPSY Left 04/21/2019   Procedure: LEFT BREAST LUMPECTOMY WITH RADIOACTIVE SEED AND SENTINEL LYMPH NODE MAPPING;  Surgeon: Erroll Luna, MD;  Location: [facility];  Service: General;  Laterality: Left;   CESAREAN SECTION     PORTACATH PLACEMENT Right 11/11/2018   Procedure: INSERTION PORT-A-CATH WITH ULTRASOUND;  Surgeon: Erroll Luna, MD;  Location: [facility];  Service: General;  Laterality: Right;   utreine polyp     resected    Family History  Problem Relation Age of Onset   Hypertension Sister   -Denies family history of heart attack, stroke, cancer. 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  HENT:  Negative for rhinorrhea and sore throat.   Respiratory:  Negative for cough and shortness of breath.   Cardiovascular:  Negative for leg swelling.  Gastrointestinal:  Positive for diarrhea and nausea.  Endocrine: Negative for polydipsia and polyuria.  Genitourinary:  Positive for vaginal discharge.  Neurological:  Positive for headaches.  Psychiatric/Behavioral:  The patient is nervous/anxious.    Objective:   Today's Vitals: BP (!) 156/80 (BP Location: Right Arm, Patient Position: Sitting, Cuff Size: Normal)   Pulse 78   Temp 98.6 F (37 C) (Oral)   Ht '5\' 1"'$  (1.549 m)   Wt 143 lb 4.8 oz (65 kg)   SpO2 100%   BMI 27.08 kg/m   Physical Exam Vitals reviewed.  Constitutional:      Appearance: Normal appearance.  Cardiovascular:     Rate and Rhythm: Normal rate and regular rhythm.  Pulmonary:     Effort: Pulmonary effort is normal.     Breath sounds: Normal breath sounds.  Abdominal:     General: Abdomen is flat.     Palpations: Abdomen is soft.  Musculoskeletal:        General: Normal range of motion.     Cervical back: Normal range of motion.  Skin:  General: Skin is warm and dry.     Capillary Refill: Capillary refill takes less than 2 seconds.  Neurological:     General: No focal deficit present.     Mental Status: She is alert and oriented to person, place, and time.    Assessment & Plan:   60 year old female here to establish care.   Please see problem-based charting for assessment and plan.

## 2020-08-29 NOTE — Assessment & Plan Note (Addendum)
Poorly controlled, A1c today of 12.1% She presented to the ED on 8/12 for vertigo and CBG was 459, no ketones on UA, no anion gap present on CMP. She endorsed headache, polydipsia and polyuria at the time and she was started on Metformin 1073m BID. Today she states that she is experiencing nausea and diarrhea when she takes the metformin, and does not always take it with food. We discussed changing her metformin to a tapered schedule and the importance of taking it with meals. She denies polyuria and polydipsia today. She checks her sugars at home using a friend's supplies and reports a recent fasting glucose of 258. We discussed that she does not need to check her BG since she is not taking insulin, but she prefers to have the information. We will order a new blood sugar meter kit today and see if she will qualify for a CGM. In terms of potential end-organ damage, recent CMP from 8/12 showed Cr of 0.89. She is managed by ophthalmology for glaucoma and was not found to have retinopathy. She endorses numbness and tingling in her feet "once in a blue moon."  We will also obtain urine microalbumin:creatinine today to further assess for kidney damage. She prefers herbal remedies and would benefit from learning about more dietary modifications, so we will send in a referral for her to meet with DButch Penny Of note, she has had a negative experience with dieticians in the past.   Plan:  -Change Metformin to 5081mfirst week, 50093mID second week, 500m70mM & 1000mg90m third week, 1000mg 33mfourth week & moving forward  -Urine microalbumin:Cr results pending  -Referral to Donna Butch Pennyounseling and potential CGM placed  -Blood glucose meter kit sent to pharmacy  -Follow-up in 3 months

## 2020-08-29 NOTE — Assessment & Plan Note (Signed)
Vitals:   08/29/20 1402  BP: (!) 156/80   BP elevated today. She checks her BP daily at home and it typically is in the [id number] systolic. We discussed that her blood pressure is still above her goal of <140/90 and we want to better manage it to lower her risk of MI and stroke. She would like to hold off on adding additional medications at this time. We gave her a printout about ARBs to consider.   Plan:  -Follow-up in 3 months -Consider addition of ARB at next visit

## 2020-08-29 NOTE — Patient Instructions (Addendum)
Thank you, [name] for allowing us to provide your care today. Today we discussed your diabetes and high blood pressure.  Diabetes: Your A1c today was 12.1%, which is much higher than your goal of 7%. Since you have been experiencing nausea and diarrhea from the Metformin 1000mg twice daily, we will start on a lower dose of Metformin. For the first week, take 500mg at dinner time. For the second week, increase to 500mg with breakfast and dinner. For the third week, increase to 500mg with breakfast and 1000mg with dinner. For the fourth week, increase to 1000mg with breakfast and dinner. This will be the dose you stay on. We will re-check your A1c at your next appointment in 3 months. We will also refer you to our diabetes coordinator and nutritionist, Donna.   2. Blood Pressure: Your blood pressures at home and here today are above your goal of 139/89. We would like you to consider adding a blood pressure medication called Losartan, which is an ARB, and we we can talk more about it at your next visit.   I have ordered the following labs for you:   Lab Orders         Microalbumin / Creatinine Urine Ratio         Glucose, capillary         Lipid Profile         POC Hbg A1C       Referrals ordered today:    Referral Orders         Ambulatory referral to Dentistry      I have ordered the following medication/changed the following medications:   Stop the following medications: Medications Discontinued During This Encounter  Medication Reason   amoxicillin-clavulanate (AUGMENTIN) 875-125 MG tablet Completed Course   benzonatate (TESSALON) 100 MG capsule Completed Course   blood glucose meter kit and supplies KIT Duplicate   magic mouthwash w/lidocaine SOLN Patient Preference   blood glucose meter kit and supplies Reorder     Start the following medications: Meds ordered this encounter  Medications   blood glucose meter kit and supplies    Sig: Dispense based on  patient and insurance preference. Use up to four times daily as directed. (FOR ICD-10 E10.9, E11.9).    Dispense:  1 each    Refill:  0    Order Specific Question:   Number of strips    Answer:   100    Order Specific Question:   Number of lancets    Answer:   100     Follow up: 3 months    Should you have any questions or concerns please call the internal medicine clinic at [phone].    Jessica Blanks, Medical Student Areeg Rehman, MD [facility]   

## 2020-08-29 NOTE — Assessment & Plan Note (Addendum)
She reports she has not seen a dentist in several years. Referral to dentistry placed today.

## 2020-08-29 NOTE — Progress Notes (Deleted)
   CC: Diabetes, hypertension, establish care  HPI:  [name] is a 60 y.o. with a past medical history of type 2 diabetes, hypertension and breast cancer in remission presenting to establish care. For details of today's visit and the status of his chronic medical issues please refer to the assessment and plan.   Past Medical History:  Diagnosis Date   Anemia    Anxiety    Breast cancer ([place])    Cancer ([place]) 10/2018   left breast IDC   DM type 2 (diabetes mellitus, type 2) ([place]) 05/18/2012   GERD (gastroesophageal reflux disease)    OTC prn   Hypertension    Personal history of chemotherapy    Personal history of radiation therapy    Review of Systems: Negative except as per assessment and plan  Physical Exam:  There were no vitals filed for this visit.  Physical Exam General: alert, appears stated age, in no acute distress HEENT: Normocephalic, atraumatic, EOM intact, conjunctiva normal CV: Regular rate and rhythm, no murmurs rubs or gallops Pulm: Clear to auscultation bilaterally, normal work of breathing Abdomen: Soft, nondistended, bowel sounds present, no tenderness to palpation MSK: No lower extremity edema Skin: Warm and dry Neuro: Alert and oriented x3   Assessment & Plan:   See Encounters Tab for problem based charting.  Patient {GC/GE:[id number]::"discussed with","seen with"} Dr. {NAMES:[id number]::"Guilloud","Hoffman","Mullen","Narendra","Williams","Vincent"}

## 2020-08-29 NOTE — Assessment & Plan Note (Addendum)
Given her age, hypertension, and poorly controlled diabetes, Ms. Casablanca is at increased risk for CV disease. We will check a lipid panel today to determine if statin therapy is warranted.   Plan: -Lipid panel results pending    ADDENDUM: Patient's lipid profile showed a total cholesterol of 165, LDL of 80 and HDL of 35 however high intensity statin recommended because of known diabetes and 10-year risk greater than 7.5% (22.6% risk of cardiovascular event in the next 10 years).  Patient is hesitant to starting additional medications at this time.  She states that she would like to give metformin some time to helping control her diabetes.  Encouraged lifestyle modifications.  And will rediscuss statin use at next visit.

## 2020-08-30 LAB — LIPID PANEL
Chol/HDL Ratio: 4.7 ratio — ABNORMAL HIGH (ref 0.0–4.4)
Cholesterol, Total: 165 mg/dL (ref 100–199)
HDL: 35 mg/dL — ABNORMAL LOW (ref >39)
LDL Chol Calc (NIH): 80 mg/dL (ref 0–99)
Triglycerides: 303 mg/dL — ABNORMAL HIGH (ref 0–149)
VLDL Cholesterol Cal: 50 mg/dL — ABNORMAL HIGH (ref 5–40)

## 2020-08-30 LAB — MICROALBUMIN / CREATININE URINE RATIO
Creatinine, Urine: 103.2 mg/dL
Microalb/Creat Ratio: 9 mg/g creat (ref 0–29)
Microalbumin, Urine: 9.4 ug/mL

## 2020-08-30 MED ORDER — BLOOD GLUCOSE METER KIT: PACK | 0 refills | Status: AC

## 2020-08-30 MED ORDER — LIDOCAINE VISCOUS HCL 2 % MT SOLN: 5.0000 mL | Freq: Three times a day (TID) | OROMUCOSAL | 0 refills | Status: DC | PRN

## 2020-08-30 NOTE — Progress Notes (Signed)
Internal Medicine Clinic Attending ° °Case discussed with Dr. Rehman  At the time of the visit.  We reviewed the resident’s history and exam and pertinent patient test results.  I agree with the assessment, diagnosis, and plan of care documented in the resident’s note.  ° °

## 2020-08-30 NOTE — Addendum Note (Signed)
Addended by: Mike Craze on: 08/30/2020 11:37 AM   Modules accepted: Level of Service

## 2020-08-30 NOTE — Addendum Note (Signed)
Addended by: Mike Craze on: 08/30/2020 12:58 PM   Modules accepted: Level of Service

## 2020-08-30 NOTE — Progress Notes (Signed)
Erin Lawson is a 60 year old female with a history of breast cancer status postradiation and lumpectomy, diabetes, glaucoma and hypertension presenting to establish care with [name].  Her hemoglobin A1c was 12.1.  She was recently started on metformin 1000 twice daily is not tolerating this medication well due to GI side effects.  Discussed slowly tapering this medication up to the maximum dosage.  Patient would like to avoid additional medications and prefers lifestyle modifications and supplements to help manage her conditions.  Discussed that metformin alone may not be enough to control her diabetes and that she may need to consider additional medications in the future.  Patient expressed understanding.  She is also agreeable to meeting with Butch Penny.  Patient would also like to avoid starting medication for her blood pressure at this time.  Will follow-up closely regarding her diabetes and hypertension.  Harlow Ohms, DO PGY-3 IMTS

## 2020-09-11 ENCOUNTER — Encounter: Payer: Self-pay | Admitting: Dietician

## 2020-09-20 ENCOUNTER — Ambulatory Visit (INDEPENDENT_AMBULATORY_CARE_PROVIDER_SITE_OTHER): Payer: 59 | Admitting: Dietician

## 2020-09-20 ENCOUNTER — Encounter: Payer: Self-pay | Admitting: Dietician

## 2020-09-20 ENCOUNTER — Other Ambulatory Visit: Payer: Self-pay | Admitting: Dietician

## 2020-09-20 DIAGNOSIS — E119 Type 2 diabetes mellitus without complications: Secondary | ICD-10-CM

## 2020-09-20 NOTE — Telephone Encounter (Signed)
Called Erin Lawson to provide support for trial CGM: she reports that it stopped working when she got home. She was advised to call [facility] to request for a replacement sensor. She verbalized understanding.

## 2020-09-20 NOTE — Patient Instructions (Addendum)
Tips to increase your Good cholesterol- look at it again after you get your diabetes under control  1- Control blood sugars ( a1c ~ 6.5%) 2- Increase activity 2- weight loss if needed after that   Tips to help your blood sugars:  1- use self monitoring ( Continuous glucose monitoring) to help you figure out what to eat and how much to eat and timing of food.   2- write down your food and activity so you can tell what affected your glucose- bring this with you when you follow up along with your meter.   Checking blood sugar 2 hours after a meal can help guide your food intake as blood sugar should be < 180 mg/dl 2 hours after eating. If it is higher then it would be good to look at factors that may have affected it that you can change.   I suggest we follow up in 2-4 weeks.   Please call with any questions  Butch Penny [phone]

## 2020-09-20 NOTE — Telephone Encounter (Signed)
Patient request prescription for CGM,  also needs accu chek supplies

## 2020-09-20 NOTE — Progress Notes (Signed)
Medical Nutrition Therapy:  Appt start time: 1400 end time:  1500. Total time: 60 minutes Visit # 1  Assessment:  Primary concerns today: diabetes "to go away" or at least be under control works 7a-7p 2 days then off 2 days, then works every other weekend She stated that she was against more weight loss and her current weight is appropriate. She denies having a family history of diabetes. She states was was diagnosed in 2015, went to one hour class to learn about it, then it "went away" when she had breast cancer and treatment for same, now it is back.   Preferred Learning Style: No preference indicated  Learning Readiness: Ready and Change in progress  ANTHROPOMETRICS: Estimated body mass index is 27.08 kg/m (pended) as calculated from the following:   Height as of this encounter: (P) '5\' 1"'$  (1.549 m).   Weight as of this encounter: (P) 143 lb 4.8 oz (65 kg).  WEIGHT HISTORY: lost weight from 156-160# to 140s during chemo 2020 and also changed diet. Wt Readings from Last 10 Encounters:  09/20/20 (P) 143 lb 4.8 oz (65 kg)  08/29/20 143 lb 4.8 oz (65 kg)  08/25/20 144 lb 6.4 oz (65.5 kg)  05/18/20 144 lb 6.4 oz (65.5 kg)  12/02/19 146 lb 6.4 oz (66.4 kg)  11/28/19 158 lb 11.7 oz (72 kg)  11/08/19 149 lb 9.6 oz (67.9 kg)  11/01/19 149 lb 6 oz (67.8 kg)  09/27/19 153 lb 4 oz (69.5 kg)  09/08/19 152 lb 6.4 oz (69.1 kg)    SLEEP: work days- Sleeps 8-830 am to 1 Pm, may takea short nap between 3-5 before work. Non work days 7-8 Pm to 7-8 am with waking 2-230 or 3 am  MEDICATIONS: taking metformin 500 mg qam and 1000 mg qpm, having some loose stools with the 1000 mg that she takes before work on some days. We discussed holding her current dose until she is better tolerating the '1000mg'$  dose, moving the '1000mg'$  dose to qam since she is at home during the day and works at night and she also wanted to know if it would be okay to go back to the 500 mg twice a day if she does not improve with the  GI symptoms on the 1000 mg.  Takes liquid MVI daily ( [place]), magnesium, calcium daily ( unsure of doses) and every other day D3  BLOOD SUGAR: reports she takes it once a week because her strips are very expensive and it is ~ 180 mg/dl Lab Results  Component Value Date   HGBA1C 12.1 (A) 08/29/2020   HGBA1C 5.8 (H) 01/11/2019   HGBA1C 8.9 (A) 10/12/2018   HGBA1C 11.1 (H) 07/21/2018   HGBA1C 7.7 (A) 12/08/2017     DIETARY INTAKE: Usual eating pattern includes 2-3 meals and 0 snacks per day. Everyday foods include poultry,fish, vegetables.  Avoided foods include red meats, sweets, most dairy Diarrhea: some after increasing to 1000 mg dose of metformin, Constipation: no Dining Out (times/week): ~1x- [place], [place] where she can get veggies [employer] original chix, coleslaw, mashed potatoes 24-hr recall:  B ( 730-8 AM): yogurt if too tired to cook, or egg & cheese, toast x2, [place] sausage, 12 oz OJ  L (3 PM): starch meat, greens and water D ( PM): penne pasta with veggies, chicken Snk ( PM): chocolate > 70% cacoa Beverages: juice, soda, water, sweet tea,   Usual physical activity: work and need to assess further  Estimated daily energy needs:~  160 g carbohydrates (40-50 g per meal)  Progress Towards Goal(s):  In progress.   Nutritional Diagnosis:  NB-1.1 Food and nutrition-related knowledge deficit NB-1.4 Self-monitoring deficit As related to not checking blood sugars due to strips being too expensive and believing her diabetes was gone.  As evidenced by her report.    Intervention:  Nutrition education about diabetes meal planning, self monitoring, CGM Action Goal: use CGM at least 6 times a day  Outcome goal: improved knowledge about how foods affect her blood sugars Coordination of care: request rx for CGM  Teaching Method Utilized: Visual, Auditory,Hands on Handouts given during visit include:CGM food record Barriers to learning/adherence to lifestyle change: competing  values and material resources Demonstrated degree of understanding via:  Teach Back   Monitoring/Evaluation:  Dietary intake, exercise, meter, and body weight in 2-4  week(s).  Debera Lat, [place] 09/20/2020 4:58 PM.

## 2020-09-21 MED ORDER — FREESTYLE LIBRE 2 SENSOR MISC: 11 refills | Status: AC

## 2020-09-21 MED ORDER — ACCU-CHEK SOFTCLIX LANCETS MISC: 11 refills | Status: AC

## 2020-09-21 MED ORDER — ACCU-CHEK GUIDE VI STRP: ORAL_STRIP | 11 refills | Status: AC

## 2020-12-14 LAB — HM DIABETES EYE EXAM

## 2020-12-20 IMAGING — US US BREAST*R* LIMITED INC AXILLA
1 series · 4 of 4 positions shown · non-contrast
Comparison: MRI October 28, 2018

CLINICAL DATA: Known breast cancer on the left. Non mass
enhancement extending superior to the known malignancy on the left
was identified. A benign mass in the skin at 6 o'clock on the right
was identified. Another mass in the upper outer left breast was
identified. However, after review, this mass correlates with a
normal intramammary lymph node identified at the time of the
patient's October 13, 2018 diagnostic mammogram.

EXAM:
ULTRASOUND OF THE BILATERAL BREAST

[Series 1: us breast*right* limited inc axilla · 0.07mm/px · 4 of 4 slices shown]
[im 1/4]
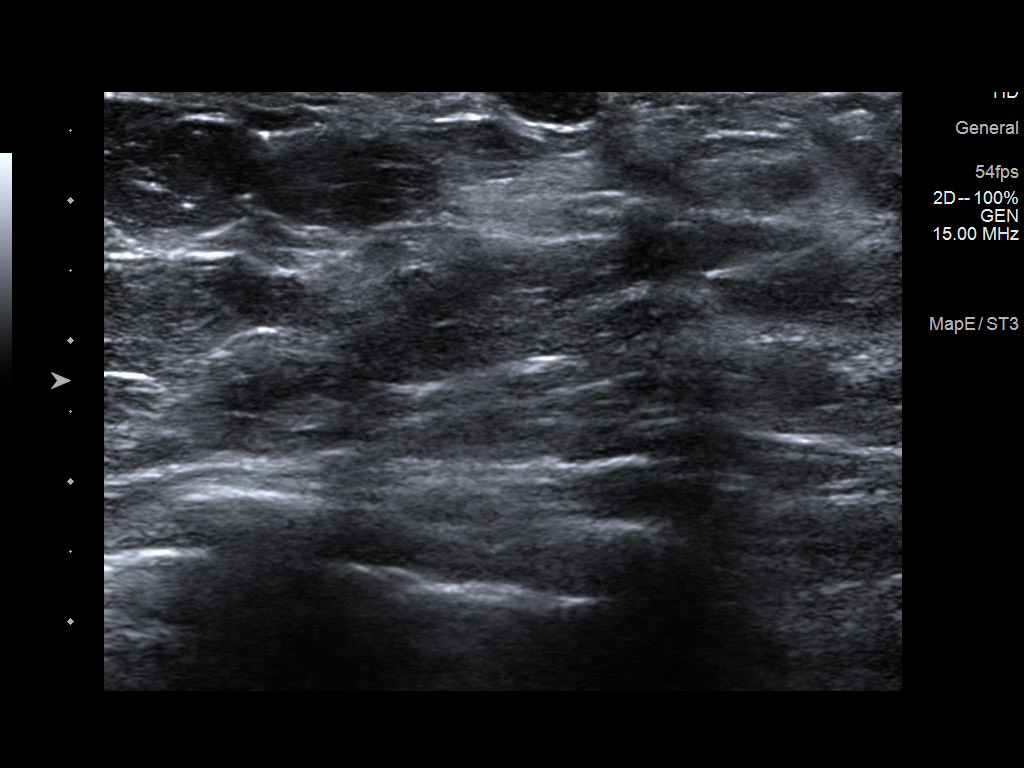
[im 2/4]
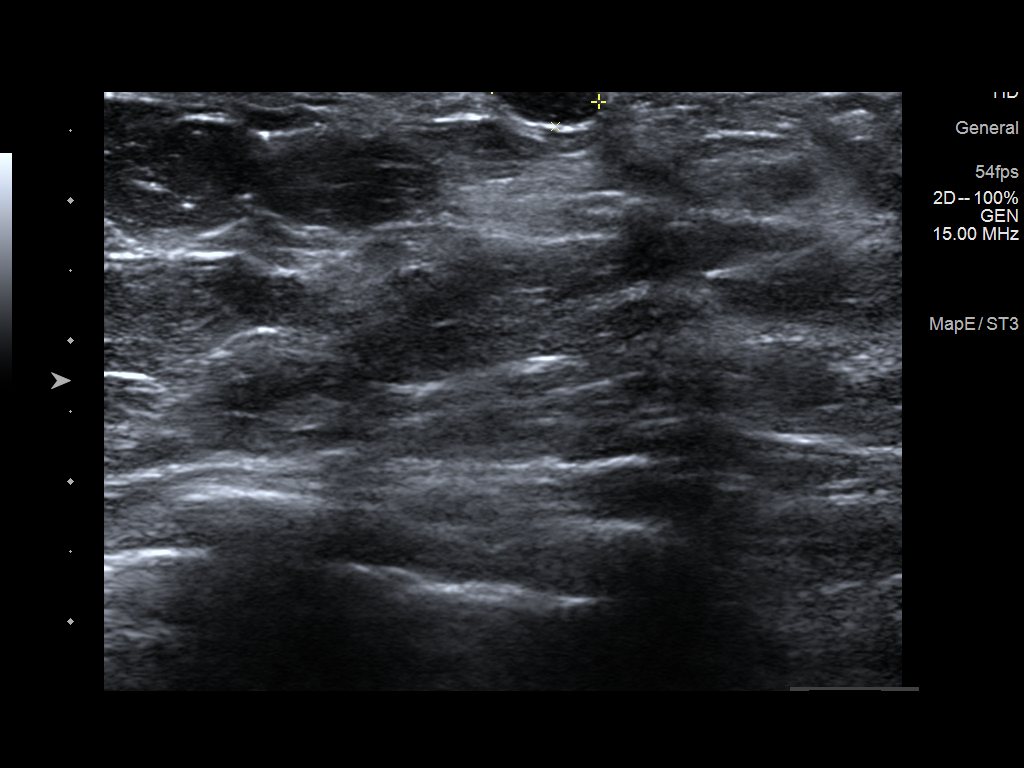
[im 3/4]
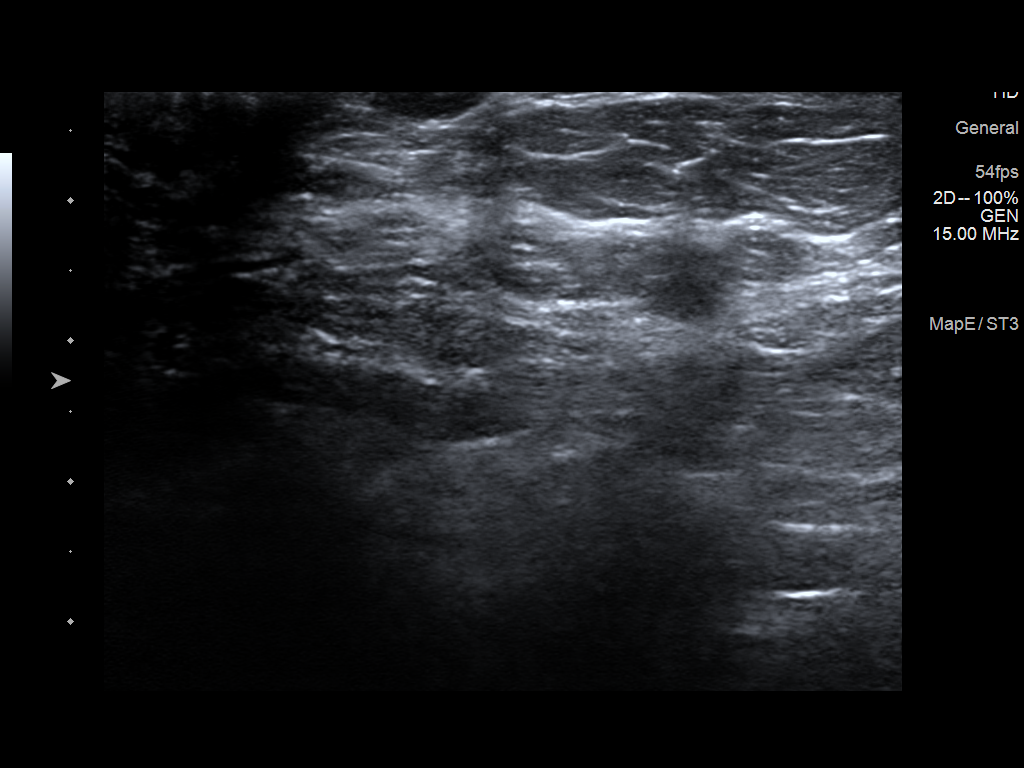
[im 4/4]
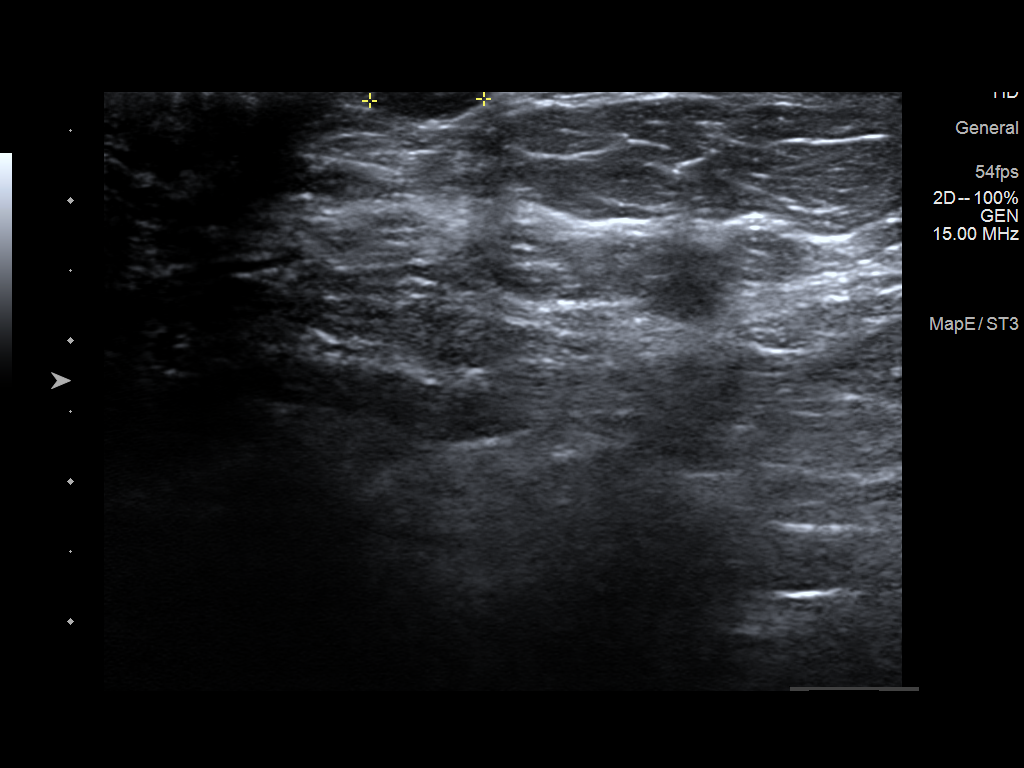

[4 of 4 positions shown; findings below may reference images not displayed]

FINDINGS: Targeted ultrasound is performed, showing a benign mass within the
skin at 6 o'clock in the right breast consistent with a sebaceous
cyst or inclusion cyst.

Ill-defined decreased echogenicity is seen superior to the patient's
known cancer on the right. However, I am not confident the true
superior extent of disease is seen with ultrasound.
IMPRESSION: Benign mass in the skin on the right at 6 o'clock.

Ill-defined increased echogenicity superior to the known malignancy
on the left likely correlates with some of the non mass enhancement
seen on recent MRI. However, I am not confident the entire superior
extent of disease is identified with ultrasound.

RECOMMENDATION:
Recommend MRI guided biopsy of the superior most extent of non mass
enhancement in the left breast described on the October 28, 2018
MRI. No follow-up is needed for the benign mass on the right.

I have discussed the findings and recommendations with the patient.
If applicable, a reminder letter will be sent to the patient
regarding the next appointment.

BI-RADS CATEGORY  4: Suspicious.

## 2020-12-20 IMAGING — US US BREAST*L* LIMITED INC AXILLA
1 series · 1 of 1 positions shown · non-contrast
Comparison: MRI October 28, 2018

CLINICAL DATA: Known breast cancer on the left. Non mass
enhancement extending superior to the known malignancy on the left
was identified. A benign mass in the skin at 6 o'clock on the right
was identified. Another mass in the upper outer left breast was
identified. However, after review, this mass correlates with a
normal intramammary lymph node identified at the time of the
patient's October 13, 2018 diagnostic mammogram.

EXAM:
ULTRASOUND OF THE BILATERAL BREAST

[Series 1: us breast*left* limited inc axilla · 0.07mm/px · 1 of 1 slices shown]
[im 1/1]
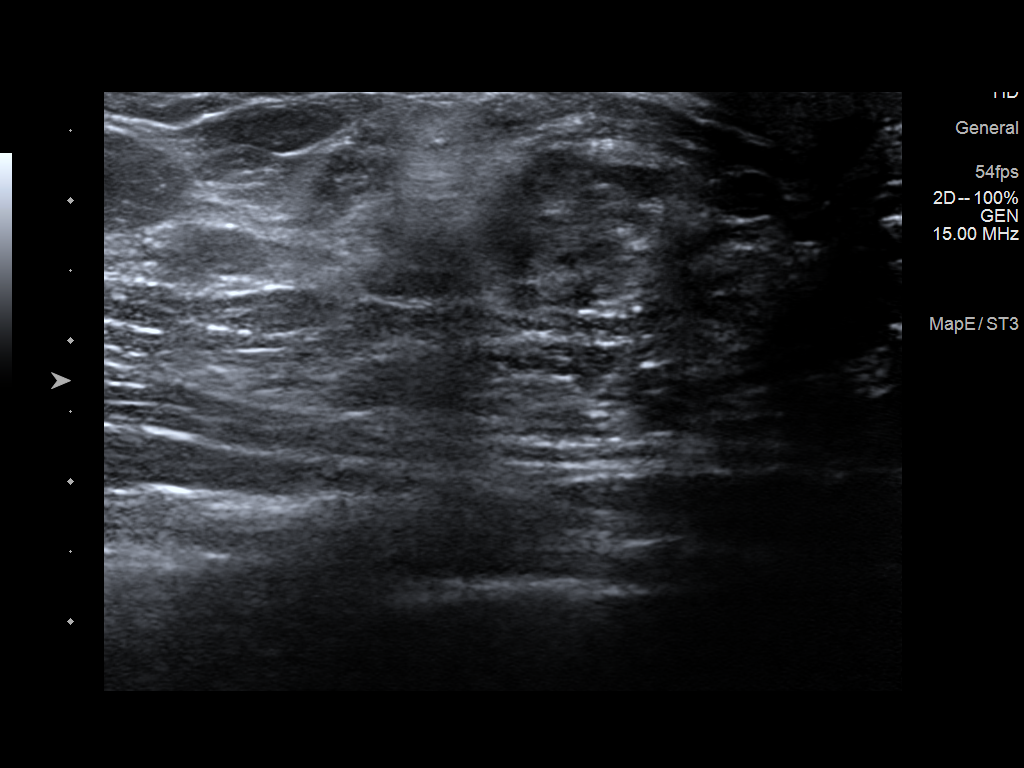

[1 of 1 positions shown; findings below may reference images not displayed]

FINDINGS: Targeted ultrasound is performed, showing a benign mass within the
skin at 6 o'clock in the right breast consistent with a sebaceous
cyst or inclusion cyst.

Ill-defined decreased echogenicity is seen superior to the patient's
known cancer on the right. However, I am not confident the true
superior extent of disease is seen with ultrasound.
IMPRESSION: Benign mass in the skin on the right at 6 o'clock.

Ill-defined increased echogenicity superior to the known malignancy
on the left likely correlates with some of the non mass enhancement
seen on recent MRI. However, I am not confident the entire superior
extent of disease is identified with ultrasound.

RECOMMENDATION:
Recommend MRI guided biopsy of the superior most extent of non mass
enhancement in the left breast described on the October 28, 2018
MRI. No follow-up is needed for the benign mass on the right.

I have discussed the findings and recommendations with the patient.
If applicable, a reminder letter will be sent to the patient
regarding the next appointment.

BI-RADS CATEGORY  4: Suspicious.

## 2021-01-18 ENCOUNTER — Encounter: Payer: Self-pay | Admitting: *Deleted

## 2021-01-31 ENCOUNTER — Encounter: Payer: 59 | Admitting: Internal Medicine

## 2021-02-15 ENCOUNTER — Other Ambulatory Visit: Payer: Self-pay | Admitting: *Deleted

## 2021-02-15 DIAGNOSIS — Z0000 Encounter for general adult medical examination without abnormal findings: Secondary | ICD-10-CM

## 2021-02-15 MED ORDER — LIDOCAINE VISCOUS HCL 2 % MT SOLN: 5.0000 mL | Freq: Three times a day (TID) | OROMUCOSAL | 0 refills | Status: DC | PRN

## 2021-02-22 ENCOUNTER — Other Ambulatory Visit: Payer: Self-pay

## 2021-02-22 MED ORDER — METFORMIN HCL 1000 MG PO TABS: 1000.0000 mg | ORAL_TABLET | Freq: Two times a day (BID) | ORAL | 2 refills | Status: DC

## 2021-03-08 ENCOUNTER — Encounter: Payer: Self-pay | Admitting: Internal Medicine

## 2021-03-08 ENCOUNTER — Ambulatory Visit (INDEPENDENT_AMBULATORY_CARE_PROVIDER_SITE_OTHER): Payer: 59 | Admitting: Internal Medicine

## 2021-03-08 DIAGNOSIS — E119 Type 2 diabetes mellitus without complications: Secondary | ICD-10-CM

## 2021-03-08 DIAGNOSIS — I10 Essential (primary) hypertension: Secondary | ICD-10-CM

## 2021-03-08 DIAGNOSIS — R0989 Other specified symptoms and signs involving the circulatory and respiratory systems: Secondary | ICD-10-CM | POA: Diagnosis not present

## 2021-03-08 DIAGNOSIS — C50212 Malignant neoplasm of upper-inner quadrant of left female breast: Secondary | ICD-10-CM | POA: Diagnosis not present

## 2021-03-08 DIAGNOSIS — Z171 Estrogen receptor negative status [ER-]: Secondary | ICD-10-CM

## 2021-03-08 LAB — POCT GLYCOSYLATED HEMOGLOBIN (HGB A1C): Hemoglobin A1C: 8.2 % — AB (ref 4.0–5.6)

## 2021-03-08 LAB — GLUCOSE, CAPILLARY: Glucose-Capillary: 161 mg/dL — ABNORMAL HIGH (ref 70–99)

## 2021-03-08 NOTE — Assessment & Plan Note (Signed)
Patient is following up for uncontrolled hypertension. Blood pressures have been elevated at prior visits with systolic up to 459X. Blood pressure 165/83 at this visit. Patient reports checking her blood pressures at home and reports systolic ranging 774-142L, depending on the day. She has been hesitant to starting antihypertensive therapy in the past. Discussed this today and the increased cardiovascular risk of untreated hypertension in setting of diabetes. She expresses hesitation for starting medication at this time due to multiple recent recalls. Discussed that there are many safe options that we can initiate and monitor closely for any side effects. Patient remains hesitant at this time.   Plan:  Follow up in 3 months Continue to encourage antihypertensive therapy

## 2021-03-08 NOTE — Progress Notes (Signed)
° °  CC: hypertension and diabetes follow up  HPI:  [name] is a 61 y.o. female with PMHx as stated below presenting for follow up of her hypertension and diabetes. Please see problem based charting for complete assessment and plan.  Past Medical History:  Diagnosis Date   Anemia    Anxiety    Breast cancer ([place])    Cancer ([place]) 10/2018   left breast IDC   DM type 2 (diabetes mellitus, type 2) ([place]) 05/18/2012   GERD (gastroesophageal reflux disease)    OTC prn   Hypertension    Personal history of chemotherapy    Personal history of radiation therapy    Review of Systems:  Negative except as stated in HPI.  Physical Exam:  There were no vitals filed for this visit. Physical Exam  Constitutional: Appears well-developed and well-nourished. No distress.  HENT: Normocephalic and atraumatic, EOMI, conjunctiva normal, moist mucous membranes, no thyromegaly appreciated  Cardiovascular: Normal rate, regular rhythm, S1 and S2 present, no murmurs, rubs, gallops.  Distal pulses intact Respiratory: No respiratory distress, Lungs are clear to auscultation bilaterally. Musculoskeletal: Normal bulk and tone.  No peripheral edema noted. Neurological: Is alert and oriented x4, no apparent focal deficits noted. Skin: Warm and dry.  Left breast with evidence of prior lumpectomy with surrounding scar tissue; no mobile masses noted. Nipple is everted without drainage. No overlying skin changes. No rash, erythema, lesions noted. Psychiatric: Normal mood and affect.   Assessment & Plan:   See Encounters Tab for problem based charting.  Patient discussed with Dr. Evette Lawson

## 2021-03-08 NOTE — Patient Instructions (Addendum)
Ms Denaya Horn,  It was a pleasure seeing you in clinic. Today we discussed:   Blood pressure: Continue to monitor your blood pressure at home. We strongly recommend starting a blood pressure medication. We can continue to talk about the safer medication options for controlling your blood pressure to minimize risk of heart attack or stroke  Diabetes: Your A1c is 8.4 today; great job! Continue to take your metformin 1000mg  daily. Follow up in 3 months for repeat A1c. Follow up with your eye doctor for diabetic eye exam.   Breast lump: It appears that this is likely scar tissue at this time. However, if you notice any changes, please let us know and we can order further testing   Throat sensation: I would recommend continued monitoring at this time. Please let us know if this is any worse.   I would recommend getting your shingles vaccine from your local pharmacy at your earliest convenience   If you have any questions or concerns, please call our clinic at [phone] between 9am-5pm and after hours call [phone] and ask for the internal medicine resident on call. If you feel you are having a medical emergency please call 911.   Thank you, we look forward to helping you remain healthy!

## 2021-03-08 NOTE — Assessment & Plan Note (Signed)
Patient is s/p treatment with chemotherapy and left breast lumpectomy with lymph node dissection followed by adjuvant radiation therapy. She is followed by Dr Lindi Adie. She reports approximately one month of a hardening lump on her left breast. On examination, this appears most consistent with scar tissue.  Patient advised to continue to monitor at this time  Plan:  Diagnostic mammogram due in June 2023

## 2021-03-08 NOTE — Assessment & Plan Note (Signed)
Patient endorses sensation of her "throat closing" that has been present for the past few months. She notes that it feels like something is swelling and closing in on her throat. She denies any association with position, although it improves with leaning forward. She denies any association with food but does note that the sensation improves with cold water. She has not had any dysphagia or reflux symptoms. Has not had any other symptoms of thyroid disease and no thyromegaly noted on exam.  Most consistent with globus pharyngeus.  Functional at this time.   Plan: Continue to monitor Patient provided education material regarding this If persists, would consider further work up

## 2021-03-08 NOTE — Assessment & Plan Note (Signed)
Patient's A1c improved to 8.2 today from 12.1 in August 2022. She is taking metformin 1000mg  daily as she was unable to tolerate side effects from 1000mg  twice daily. She reports medication compliance and notes that her blood glucose readings at home have been <200. Random glucose 161 in office.  She follows with Dr Gershon Crane for her eye care every 3 months.  Plan: Continue metformin 1000mg  daily  Follow up in 3 months for repeat A1c

## 2021-03-09 NOTE — Progress Notes (Signed)
Internal Medicine Clinic Attending ° °Case discussed with Dr. Aslam  At the time of the visit.  We reviewed the resident’s history and exam and pertinent patient test results.  I agree with the assessment, diagnosis, and plan of care documented in the resident’s note.  °

## 2021-03-12 ENCOUNTER — Encounter: Payer: Self-pay | Admitting: Dietician

## 2021-03-28 ENCOUNTER — Other Ambulatory Visit: Payer: Self-pay | Admitting: Surgery

## 2021-03-28 DIAGNOSIS — N6320 Unspecified lump in the left breast, unspecified quadrant: Secondary | ICD-10-CM

## 2021-03-30 ENCOUNTER — Ambulatory Visit: Payer: 59 | Admitting: Internal Medicine

## 2021-03-30 ENCOUNTER — Other Ambulatory Visit: Payer: Self-pay

## 2021-03-30 ENCOUNTER — Encounter: Payer: Self-pay | Admitting: Internal Medicine

## 2021-03-30 VITALS — BP 149/95 | HR 84 | Temp 97.9°F | Wt 152.5 lb

## 2021-03-30 DIAGNOSIS — S29012A Strain of muscle and tendon of back wall of thorax, initial encounter: Secondary | ICD-10-CM | POA: Diagnosis not present

## 2021-03-30 DIAGNOSIS — M5489 Other dorsalgia: Secondary | ICD-10-CM | POA: Insufficient documentation

## 2021-03-30 HISTORY — DX: Other dorsalgia: M54.89

## 2021-03-30 MED ORDER — LIDOCAINE 5 % EX PTCH: 1.0000 | MEDICATED_PATCH | CUTANEOUS | 0 refills | Status: AC

## 2021-03-30 NOTE — Assessment & Plan Note (Addendum)
Complaints of right sided back pain for 2 weeks. She denies any falls or trauma to the area. Has has tried tylenol as needed with mild relief. On exam, no midline spinal tenderness with right paraspinal thoracic TTP; left side without any acute findings or tenderness. No overlying skin changes. Pain is not pleuritic in nature, and she does not have overlying rib pain. The pain is diffuse in nature, rather than pinpoint. No pain along midline spine. Suspect this is right paraspinal thoracic strain, and will improve with conservative management.  ?? ?Tylenol 3g TID prn  ?Thermotherapy  ?Lidocaine patches prescribed  ?F/u in 3-5 weeks or prn if worsening  ?

## 2021-03-30 NOTE — Progress Notes (Signed)
Internal Medicine Clinic Attending  Case discussed with Dr. Patel  At the time of the visit.  We reviewed the resident's history and exam and pertinent patient test results.  I agree with the assessment, diagnosis, and plan of care documented in the resident's note.  

## 2021-03-30 NOTE — Progress Notes (Signed)
? ?CC: acute visit for back pain x 2 weeks  ? ?HPI: ? ?[name] is a 61 y.o. female with a PMHx stated below and presents today for stated above. Please see the Encounters tab for problem-based Assessment & Plan for additional details.  ? ?Past Medical History:  ?Diagnosis Date  ? Anemia   ? Anxiety   ? Breast cancer ([place])   ? Cancer ([place]) 10/2018  ? left breast IDC  ? DM type 2 (diabetes mellitus, type 2) ([place]) 05/18/2012  ? GERD (gastroesophageal reflux disease)   ? OTC prn  ? Hypertension   ? Personal history of chemotherapy   ? Personal history of radiation therapy   ? ? ?Current Outpatient Medications on File Prior to Visit  ?Medication Sig Dispense Refill  ? Accu-Chek Softclix Lancets lancets Check blood sugar 3 times a day 100 each 11  ? blood glucose meter kit and supplies Dispense based on patient and insurance preference. Use up to four times daily as directed. (FOR ICD-10 E10.9, E11.9). 1 each 0  ? Continuous Blood Gluc Sensor (FREESTYLE LIBRE 2 SENSOR) MISC Use to check blood sugar at least 6 times a day before and 2 hours after meals 2 each 11  ? fluticasone (FLONASE) 50 MCG/ACT nasal spray Place 2 sprays into both nostrils daily. 9.9 mL 0  ? glucose blood (ACCU-CHEK GUIDE) test strip Check blood sugar 3 times per day 100 each 11  ? latanoprost (XALATAN) 0.005 % ophthalmic solution Place 1 drop into both eyes daily.    ? loratadine (CLARITIN) 10 MG tablet Take 1 tablet (10 mg total) by mouth daily. 30 tablet 0  ? magic mouthwash (lidocaine, diphenhydrAMINE, alum & mag hydroxide) suspension Swish and spit 5 mLs 3 (three) times daily as needed for mouth pain. 360 mL 0  ? metFORMIN (GLUCOPHAGE) 1000 MG tablet Take 1 tablet (1,000 mg total) by mouth 2 (two) times daily. 60 tablet 2  ? terconazole (TERAZOL 7) 0.4 % vaginal cream Place 1 applicator vaginally daily.    ? [DISCONTINUED] prochlorperazine (COMPAZINE) 10 MG tablet Take 1 tablet (10 mg total) by mouth every 6 (six) hours as needed  (Nausea or vomiting). (Patient not taking: Reported on 03/24/2019) 30 tablet 1  ? [DISCONTINUED] sitaGLIPtin-metformin (JANUMET) 50-1000 MG tablet Take 1 tablet by mouth 2 (two) times daily with a meal. 60 tablet 3  ? ?No current facility-administered medications on file prior to visit.  ? ? ?Family History  ?Problem Relation Age of Onset  ? Hypertension Sister   ? Heart attack Neg Hx   ? ? ?Social History  ? ?Socioeconomic History  ? Marital status: Single  ?  Spouse name: Not on file  ? Number of children: Not on file  ? Years of education: Not on file  ? Highest education level: Not on file  ?Occupational History  ? Occupation: cna  ?  Comment: [employer]  ? Occupation: Corporate treasurer  ?Tobacco Use  ? Smoking status: Never  ? Smokeless tobacco: Never  ?Vaping Use  ? Vaping Use: Not on file  ?Substance and Sexual Activity  ? Alcohol use: No  ? Drug use: No  ? Sexual activity: Not Currently  ?  Birth control/protection: Post-menopausal  ?Other Topics Concern  ? Not on file  ?Social History Narrative  ? Marital status: single; not dating  ?    Children: twins in 63; 3 grandchildren/boys  ?    Lives: with friend, son  ?  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    Tobacco: none  ?    Alcohol: none  ?    Drugs: none  ?    Exercise: none  ? ?Social Determinants of Health  ? ?Financial Resource Strain: Not on file  ?Food Insecurity: Not on file  ?Transportation Needs: Not on file  ?Physical Activity: Not on file  ?Stress: Not on file  ?Social Connections: Not on file  ?Intimate Partner Violence: Not on file  ? ? ?Review of Systems: ?ROS negative except for what is noted on the assessment and plan. ? ?Vitals:  ? 03/30/21 0953  ?BP: (!) 149/95  ?Pulse: 84  ?Temp: 97.9 ?F (36.6 ?C)  ?TempSrc: Oral  ?SpO2: 98%  ?Weight: 152 lb 8 oz (69.2 kg)  ? ? ?Physical Exam: ?Constitutional: alert, well-appearing, in NAD ?HENT: normocephalic, atraumatic,  mucous membranes moist ?Eyes: conjunctiva non-erythematous, EOMI ?Neck: no thyromegaly  ?Cardiovascular: RRR, no m/r/g, non-edematous bilateral LE ?Pulmonary/Chest: normal work of breathing on RA, LCTAB. No pleuritic chest pain.  ?Abdominal: soft, non-tender to palpation, non-distended ?MSK: normal bulk and tone, no CVA tenderness ?Neurological: A&O x 3 and follows commands. Right sided para-spinal thoracic tenderness, no overlying skin changes. No left sided para-spinal thoracic tenderness. No TTP alone midline spine.  ?Skin: warm and dry  ?Psych: normal behavior, normal affect  ? ? ?Assessment & Plan:  ? ?See Encounters Tab for problem based charting. ? ?Patient discussed with Dr. Saverio Danker ? ?Lajean Manes, MD  ?Internal Medicine Resident, PGY-1 ?[facility]  ? ?

## 2021-03-30 NOTE — Patient Instructions (Addendum)
Start applying these patches to the area of pain  ?Take tylenol as needed, up to max 3000 mg daily  ?Apply heating pads ?Take warms showers  ?Continue applying lotion  ?

## 2021-04-02 ENCOUNTER — Telehealth: Payer: Self-pay

## 2021-04-02 ENCOUNTER — Telehealth: Payer: Self-pay | Admitting: Internal Medicine

## 2021-04-02 NOTE — Telephone Encounter (Signed)
DECISION  ? ? ? DENIED  ? ? ? ?Why was my request denied? ?This request was denied because you did not meet the following clinical requirements: ?Based on the information provided, you do not meet the established medication-specific criteria or ?guidelines for Lidocaine at this time. ?The request for coverage for Lidocaine Pad 5%, use as directed (30 per month), is denied. This ?decision is based on health plan criteria for Lidocaine. This medicine is covered only if: ?You have one of the following diagnoses: ?(A) Post-herpetic neuralgia. ?(B) Neuropathic pain. ?The information provided does not show that you meet the criteria listed above. ?Reviewed by: Gemma Payor, Rph ? ? ? ? ( COPY PLACED TO BE SCANNED TO CHART )  ?

## 2021-04-02 NOTE — Telephone Encounter (Signed)
Pa  for pt  ( LIDOCAINE 5 % )  came through  on cover my meds was submitted with office notes  from 3/17.Marland Kitchen awaiting approval or denial .../ ? ?UPDATE : ? ? ? ?[employer] is reviewing your PA request. Typically an electronic response will be received within 24-72 hours. ?

## 2021-04-02 NOTE — Telephone Encounter (Signed)
Pt called / informed Dr Posey Pronto stated he told pt he will send in rx for Lidocaine patches not cerave cream which can be purchased OTC. Pt informed Lidocaine patches needs a PA in which has been started (per Annmaria"s encounter note). Pt stated ok. ?

## 2021-04-02 NOTE — Telephone Encounter (Signed)
Refill Request- Pt was seen on 03/30/2021  and states a CeraVe Cream was to be called in. ? ?Please call the patient back . ? ? ?[id number] [place] [facility] [address](Ph: [phone] ? ?

## 2021-04-02 NOTE — Telephone Encounter (Signed)
Called pt - informed CeraVe Moisturizing Cream can be purchased OTC. Pt stated the doctor told her he would send a rx to the pharmacy to see if her insurance would pay for it ; instead of paying $15-20 out of her pocket. ?Thanks ?

## 2021-04-09 NOTE — Telephone Encounter (Signed)
Requesting to speak with a nurse about getting a different patch or medication  other than lidocaine (LIDODERM) 5 %. Please call pt back.  ?

## 2021-04-10 ENCOUNTER — Other Ambulatory Visit: Payer: Self-pay | Admitting: Internal Medicine

## 2021-04-10 MED ORDER — DICLOFENAC SODIUM 1 % EX GEL: 4.0000 g | Freq: Two times a day (BID) | CUTANEOUS | 0 refills | Status: DC | PRN

## 2021-04-10 NOTE — Telephone Encounter (Signed)
I called pt to tell that her med was denied and why .. she stated that she spoke to the pharmacist yesterday who told her to have the Dr send an alt to the lidocaine  patches .Marland Kitchen I explained I had already sent a message to the Provider with the decision .Marland Kitchen but she stated she is in pain  and needs something for the pain  ?

## 2021-04-19 ENCOUNTER — Ambulatory Visit
Admission: RE | Admit: 2021-04-19 | Discharge: 2021-04-19 | Disposition: A | Discharge status: Home / Self Care | Payer: 59 | Source: Ambulatory Visit | Attending: Surgery | Admitting: Surgery

## 2021-04-19 DIAGNOSIS — N6320 Unspecified lump in the left breast, unspecified quadrant: Secondary | ICD-10-CM

## 2021-05-04 NOTE — Progress Notes (Signed)
? ?Patient Care Team: ?Mike Craze, DO as PCP - General (Internal Medicine) ?Mauro Kaufmann, RN as Oncology Nurse Navigator ?Rockwell Germany, RN as Oncology Nurse Navigator ?Erroll Luna, MD as Consulting Physician (General Surgery) ?Nicholas Lose, MD as Consulting Physician (Hematology and Oncology) ?Gery Pray, MD as Consulting Physician (Radiation Oncology) ? ?DIAGNOSIS:  ?Encounter Diagnosis  ?Name Primary?  ? Malignant neoplasm of upper-inner quadrant of left breast in female, estrogen receptor negative ([place])   ? ? ?SUMMARY OF ONCOLOGIC HISTORY: ?Oncology History  ?Malignant neoplasm of upper-inner quadrant of left breast in female, estrogen receptor negative ([place])  ?10/26/2018 Initial Diagnosis  ? Patient palpated a left breast mass x 5 weeks. Mammogram showed a 2.8cm mass with calcifications spanning 3.2cm in the left breast and two mildly thickened axillary lymph nodes up to 0.4cm. Biopsy showed IDC, grade 3, HER-2 + (3+), ER/PR -, Ki67 50%, and no evidence of carcinoma in the left axilla.  ?  ?10/28/2018 Cancer Staging  ? Staging form: Breast, AJCC 8th Edition ?- Clinical stage from 10/28/2018: Stage IIA (cT2, cN0, cM0, G2, ER-, PR-, HER2+) - Signed by Nicholas Lose, MD on 10/28/2018 ? ?  ?11/12/2018 - 03/08/2019 Chemotherapy  ? The patient had dexamethasone (DECADRON) 4 MG tablet, 4 mg (100 % of original dose 4 mg), Oral, 2 times daily, 1 of 1 cycle, Start date: 10/28/2018, End date: 11/20/2018 ?Dose modification: 4 mg (original dose 4 mg, Cycle 0) ?palonosetron (ALOXI) injection 0.25 mg, 0.25 mg, Intravenous,  Once, 6 of 6 cycles ?Administration: 0.25 mg (11/12/2018), 0.25 mg (12/03/2018), 0.25 mg (02/10/2019), 0.25 mg (03/05/2019), 0.25 mg (12/23/2018), 0.25 mg (01/18/2019) ?pegfilgrastim (NEULASTA) injection 6 mg, 6 mg, Subcutaneous, Once, 5 of 5 cycles ?Administration: 6 mg (12/05/2018), 6 mg (12/25/2018), 6 mg (01/20/2019), 6 mg (02/12/2019), 6 mg (03/08/2019) ?pegfilgrastim-jmdb (FULPHILA)  injection 6 mg, 6 mg, Subcutaneous,  Once, 1 of 1 cycle ?Administration: 6 mg (11/14/2018) ?CARBOplatin (PARAPLATIN) 500 mg in sodium chloride 0.9 % 250 mL chemo infusion, 500 mg (101.8 % of original dose 493 mg), Intravenous,  Once, 6 of 6 cycles ?Dose modification:   (original dose 493 mg, Cycle 1, Reason: Provider Judgment) ?Administration: 500 mg (11/12/2018), 420 mg (12/03/2018), 420 mg (02/10/2019), 420 mg (03/05/2019), 420 mg (12/23/2018), 420 mg (01/18/2019) ?DOCEtaxel (TAXOTERE) 110 mg in sodium chloride 0.9 % 250 mL chemo infusion, 65 mg/m2 = 110 mg (100 % of original dose 65 mg/m2), Intravenous,  Once, 6 of 6 cycles ?Dose modification: 65 mg/m2 (original dose 65 mg/m2, Cycle 1, Reason: Provider Judgment), 50 mg/m2 (original dose 65 mg/m2, Cycle 2, Reason: Dose not tolerated), 40 mg/m2 (original dose 65 mg/m2, Cycle 5, Reason: Dose not tolerated) ?Administration: 110 mg (11/12/2018), 80 mg (12/03/2018), 70 mg (02/10/2019), 70 mg (03/05/2019), 70 mg (12/23/2018), 70 mg (01/18/2019) ?pertuzumab (PERJETA) 420 mg in sodium chloride 0.9 % 250 mL chemo infusion, 420 mg (100 % of original dose 420 mg), Intravenous, Once, 6 of 6 cycles ?Dose modification: 420 mg (original dose 420 mg, Cycle 1, Reason: Provider Judgment) ?Administration: 420 mg (11/12/2018), 420 mg (12/03/2018), 420 mg (02/10/2019), 420 mg (03/05/2019), 420 mg (12/23/2018), 420 mg (01/18/2019) ?fosaprepitant (EMEND) 150 mg, dexamethasone (DECADRON) 12 mg in sodium chloride 0.9 % 145 mL IVPB, , Intravenous,  Once, 6 of 6 cycles ?Administration:  (11/12/2018),  (12/03/2018),  (02/10/2019),  (03/05/2019),  (12/23/2018),  (01/18/2019) ?trastuzumab-anns (KANJINTI) 399 mg in sodium chloride 0.9 % 250 mL chemo infusion, 6 mg/kg = 399 mg (100 % of original dose 6  mg/kg), Intravenous,  Once, 5 of 5 cycles ?Dose modification: 6 mg/kg (original dose 6 mg/kg, Cycle 2, Reason: Other (see comments), Comment: J-code in auth referral is for Kanjinti) ?Administration: 399 mg  (12/03/2018), 399 mg (12/23/2018), 399 mg (01/18/2019), 399 mg (02/10/2019), 399 mg (03/05/2019) ?trastuzumab-dkst (OGIVRI) 525 mg in sodium chloride 0.9 % 250 mL chemo infusion, 8 mg/kg = 525 mg, Intravenous,  Once, 1 of 1 cycle ?Administration: 525 mg (11/12/2018) ? ? for chemotherapy treatment.  ? ?  ?04/02/2019 - 11/08/2019 Chemotherapy  ?  ? ?  ? ?  ?04/21/2019 Surgery  ? Left lumpectomy ([place]): multiple foci of residual carcinoma (0.25 cm max), clear margins, 4 left axillary lymph nodes negative.  ?  ?05/25/2019 - 06/22/2019 Radiation Therapy  ? Adjuvant radiation ?  ? ? ?CHIEF COMPLIANT: Surveillance of left breast cancer  ?  ? ?INTERVAL HISTORY: Erin Lawson is a 61 y.o. with above-mentioned history of left breast cancer who completed neoadjuvant chemotherapy, underwent a lumpectomy, radiation, and completed Herceptin Perjeta maintenance. She presents to the clinic today for annual follow-up.  She developed a scar tissue over the left breast scar which was evaluated by mammogram and ultrasound and was felt to be fat necrosis.  Other than that she is without any major problems.  Her hair is coming back although there are some areas where it is not coming back well. ? ? ?ALLERGIES:  is allergic to atenolol, poractant alfa, pork-derived products, and meloxicam. ? ?MEDICATIONS:  ?Current Outpatient Medications  ?Medication Sig Dispense Refill  ? Accu-Chek Softclix Lancets lancets Check blood sugar 3 times a day 100 each 11  ? blood glucose meter kit and supplies Dispense based on patient and insurance preference. Use up to four times daily as directed. (FOR ICD-10 E10.9, E11.9). 1 each 0  ? Continuous Blood Gluc Sensor (FREESTYLE LIBRE 2 SENSOR) MISC Use to check blood sugar at least 6 times a day before and 2 hours after meals 2 each 11  ? diclofenac Sodium (VOLTAREN) 1 % GEL Apply 4 g topically 2 (two) times daily as needed. 4 g 0  ? Ergocalciferol 10 MCG (400 UNIT) TABS Take 2 tablets by mouth daily. 60  tablet 2  ? fluticasone (FLONASE) 50 MCG/ACT nasal spray Place 2 sprays into both nostrils daily. 9.9 mL 0  ? glucose blood (ACCU-CHEK GUIDE) test strip Check blood sugar 3 times per day 100 each 11  ? latanoprost (XALATAN) 0.005 % ophthalmic solution Place 1 drop into both eyes daily.    ? lidocaine (LIDODERM) 5 % Place 1 patch onto the skin every 12 (twelve) hours. Remove & Discard patch within 12 hours or as directed by MD 10 patch 0  ? loratadine (CLARITIN) 10 MG tablet Take 1 tablet (10 mg total) by mouth daily. 30 tablet 0  ? magic mouthwash (lidocaine, diphenhydrAMINE, alum & mag hydroxide) suspension Swish and spit 5 mLs 3 (three) times daily as needed for mouth pain. 360 mL 0  ? metFORMIN (GLUCOPHAGE) 1000 MG tablet Take 1 tablet (1,000 mg total) by mouth 2 (two) times daily. 60 tablet 2  ? ?No current facility-administered medications for this visit.  ? ? ?PHYSICAL EXAMINATION: ?ECOG PERFORMANCE STATUS: 1 - Symptomatic but completely ambulatory ? ?Vitals:  ? 05/18/21 0849  ?BP: (!) 176/76  ?Pulse: 79  ?Resp: 18  ?Temp: 97.8 ?F (36.6 ?C)  ?SpO2: 100%  ? ?Filed Weights  ? 05/18/21 0849  ?Weight: 148 lb 11.2 oz (67.4 kg)  ? ? ?  BREAST: Left breast scar tissue nodule that was evaluated with mammogram and ultrasound. No palpable axillary supraclavicular or infraclavicular adenopathy no breast tenderness or nipple discharge. (exam performed in the presence of a chaperone) ? ?LABORATORY DATA:  ?I have reviewed the data as listed ? ?  Latest Ref Rng & Units 08/25/2020  ?  6:26 PM 08/25/2020  ?  6:03 PM 11/08/2019  ? 11:20 AM  ?CMP  ?Glucose 70 - 99 mg/dL  455   232    ?BUN 8 - 23 mg/dL  11   19    ?Creatinine 0.44 - 1.00 mg/dL  0.89   0.83    ?Sodium 135 - 145 mmol/L 136   133   136    ?Potassium 3.5 - 5.1 mmol/L 3.7   3.6   3.6    ?Chloride 98 - 111 mmol/L  98   103    ?CO2 22 - 32 mmol/L  24   25    ?Calcium 8.9 - 10.3 mg/dL  9.5   9.8    ?Total Protein 6.5 - 8.1 g/dL  6.5   7.6    ?Total Bilirubin 0.3 - 1.2 mg/dL   0.4   0.3    ?Alkaline Phos 38 - 126 U/L  111   148    ?AST 15 - 41 U/L  27   18    ?ALT 0 - 44 U/L  26   20    ? ? ?Lab Results  ?Component Value Date  ? WBC 7.6 08/25/2020  ? HGB 12.2 08/25/2020  ? HCT 36.0 08/25/2020

## 2021-05-08 ENCOUNTER — Ambulatory Visit: Payer: 59 | Admitting: Internal Medicine

## 2021-05-08 ENCOUNTER — Encounter: Payer: Self-pay | Admitting: Internal Medicine

## 2021-05-08 ENCOUNTER — Other Ambulatory Visit: Payer: Self-pay

## 2021-05-08 VITALS — BP 152/59 | HR 77 | Temp 98.2°F | Resp 24 | Ht 61.0 in | Wt 150.1 lb

## 2021-05-08 DIAGNOSIS — M549 Dorsalgia, unspecified: Secondary | ICD-10-CM

## 2021-05-08 DIAGNOSIS — B0229 Other postherpetic nervous system involvement: Secondary | ICD-10-CM | POA: Diagnosis not present

## 2021-05-08 DIAGNOSIS — I10 Essential (primary) hypertension: Secondary | ICD-10-CM

## 2021-05-08 DIAGNOSIS — E119 Type 2 diabetes mellitus without complications: Secondary | ICD-10-CM

## 2021-05-08 DIAGNOSIS — E559 Vitamin D deficiency, unspecified: Secondary | ICD-10-CM | POA: Diagnosis not present

## 2021-05-08 MED ORDER — GABAPENTIN 600 MG PO TABS: 300.0000 mg | ORAL_TABLET | Freq: Three times a day (TID) | ORAL | 11 refills | Status: DC

## 2021-05-08 NOTE — Progress Notes (Addendum)
? ?CC: Checkup, back pain ? ?HPI: ? ?[name] is a 61 y.o. female with past medical history as detailed below who presents for checkup and continued back pain.  Please see problem charting for detail assessment and plan. ? ?Past Medical History:  ?Diagnosis Date  ? Anemia   ? Anxiety   ? At increased risk for cardiovascular disease 08/29/2020  ? Breast cancer ([place])   ? Cancer ([place]) 10/2018  ? left breast IDC  ? Chest pain 05/18/2012  ? DM type 2 (diabetes mellitus, type 2) ([place]) 05/18/2012  ? GERD (gastroesophageal reflux disease)   ? OTC prn  ? Hypertension   ? Pain in paraspinal region 03/30/2021  ? Personal history of chemotherapy   ? Personal history of radiation therapy   ? ?Review of Systems:   ?Review of Systems  ?Constitutional:  Negative for chills, fever and malaise/fatigue.  ?Respiratory:  Negative for shortness of breath.   ?Cardiovascular:  Negative for chest pain.  ?Musculoskeletal:  Positive for back pain and myalgias. Negative for falls.  ?Skin:  Positive for itching. Negative for rash.  ?Neurological:  Positive for tingling and sensory change. Negative for tremors and weakness.  ? ? ?Physical Exam: ? ?Vitals:  ? 05/08/21 1401  ?BP: (!) 152/59  ?Pulse: 77  ?Resp: (!) 24  ?Temp: 98.2 ?F (36.8 ?C)  ?TempSrc: Oral  ?SpO2: 99%  ?Weight: 150 lb 1.6 oz (68.1 kg)  ?Height: '5\' 1"'$  (1.549 m)  ? ?Constitutional: No acute distress. ?Cardio: Regular rate and rhythm.  No murmurs, rubs, gallops. ?Pulm: Clear to auscultation bilaterally. ?Abdomen: Soft, nontender, nondistended. ?MSK: Negative for extremity edema.  Mild tenderness after the right upper thorax, over the scapula and along the T7 dermatomal area. ?Skin: Skin is warm and dry without rash or lesion. ?Neuro: Alert and oriented x3, no focal deficit noted. ?Psych: Pleasant mood and affect. ? ?Assessment & Plan:  ? ?Postherpetic neuralgia ?Patient continues to endorse pain in her upper right back area.  She says that it started late in February  as more of a deep itching and tingling sensation.  She was seen in clinic mid March at which point she had no skin changes and persistence of this pain, and was felt to have more of a paraspinal muscle strain at that time.  Prior to the outbreak of her rash she did have a distribution of pain over the right scapula as well.  After this visit she had an eruption of a large red rash linearly over the right upper thorax that was very itchy and painful.  In the last 1 week the rash is gone away.  She did not ever have fever. ? ?At this time she reports ports continued pain in the area that the rash was, stating that feels like fire and burning.  She was previously prescribed Voltaren gel which she did try to use but it did not work.  She has not tried any other over-the-counter creams.  She has a history of eczema but this rash is not like her eczematous rashes. ? ?Physical exam is unrevealing for rash at this time. ?Assessment: Based on patient's subjective symptoms and the timeline I feel that she is currently suffering from postherpetic neuralgia.  It is likely that when she was seen in March the pain and itching that she was feeling was the beginning stages of a shingles outbreak which she has since recovered from. ?Plan: I will start patient on gabapentin for postherpetic neuralgia.  She  has been instructed to take 300 mg once daily for the first day, twice daily on day 2, and 3 times daily thereafter.  I explained to her that this pain can linger for quite some time but that I am hopeful we will get control with the gabapentin. ? ?Essential hypertension ?BP elevated above goal at 132/59.  When speaking with patient regarding home blood pressure checks, she states that her systolic blood pressure at home is typically in the 130s. ?Assessment: Based on home BP measurements she is at goal.  She does not have a log with her today. ?Plan: I will not start patient on antihypertensives at this time.  I requested that at  her next visit she bring her home blood pressure log and cuff for [name] to assess. ? ?DM type 2 (diabetes mellitus, type 2) ?Patient is not due for a HbA1c recheck at this time.  At this time she is only taking metformin 1000 mg once daily rather than twice, as she was recently having severe GI upset with the twice daily dosing.  She does check her sugar at home and says that her range is typically 120-170, with some readings in the low 100s.  She has not had symptomatic lows.  She states that she has made dietary changes such as decreasing her red meat intake significantly and eating more vegetables. ?Assessment: I would not make changes to her diabetes medication regimen at this time, though she is at risk for needing to increase her metformin dosing or starting a new agent such as SGLT2 inhibitor at her next visit based on her HbA1c and glucometer values. ?Plan: We will have patient follow-up in 8 weeks and check an HbA1c at that time. She will likely need adjustment of her regimen at that time. ? ?Vitamin D deficiency ?Vitamin D level has not been checked since 2019, at which time it was 11.2.  Patient is not currently taking vitamin D supplementation. ?Assessment: Patient needs recheck of her vitamin D levels.   ?Plan: Vitamin D level obtained.  Will prescribe supplementation as indicated. ? ?ADDENDUM: Vitamin D level is 16.5. I will send in a prescription for vitamin D supplementation, 800 units daily and plan to recheck her level in about 3 months. Patient is aware and in agreement. ? ? ?See Encounters Tab for problem based charting. ? ?Patient discussed with Dr.  Cain Sieve ? ?

## 2021-05-08 NOTE — Assessment & Plan Note (Signed)
Patient continues to endorse pain in her upper right back area.  She says that it started late in February as more of a deep itching and tingling sensation.  She was seen in clinic mid March at which point she had no skin changes and persistence of this pain, and was felt to have more of a paraspinal muscle strain at that time.  Prior to the outbreak of her rash she did have a distribution of pain over the right scapula as well.  After this visit she had an eruption of a large red rash linearly over the right upper thorax that was very itchy and painful.  In the last 1 week the rash is gone away.  She did not ever have fever. ? ?At this time she reports ports continued pain in the area that the rash was, stating that feels like fire and burning.  She was previously prescribed Voltaren gel which she did try to use but it did not work.  She has not tried any other over-the-counter creams.  She has a history of eczema but this rash is not like her eczematous rashes. ? ?Physical exam is unrevealing for rash at this time. ?Assessment: Based on patient's subjective symptoms and the timeline I feel that she is currently suffering from postherpetic neuralgia.  It is likely that when she was seen in March the pain and itching that she was feeling was the beginning stages of a shingles outbreak which she has since recovered from. ?Plan: I will start patient on gabapentin for postherpetic neuralgia.  She has been instructed to take 300 mg once daily for the first day, twice daily on day 2, and 3 times daily thereafter.  I explained to her that this pain can linger for quite some time but that I am hopeful we will get control with the gabapentin. ?

## 2021-05-08 NOTE — Patient Instructions (Signed)
It was nice seeing you today! Thank you for choosing [facility] for your Primary Care.  ?  ?Today we talked about your back pain. I think that your pain is from a recent shingles infection. This pain unfortunately can last for a long time. In the meantime I have sent gabapentin which is a medicine that can help with nerve pain. Please take it as such: ? ?Day 1: 300 mg once daily ?Day 2: 300 mg twice daily ?Day 3 and beyond: 300 mg three times daily ? ?You will need to cut the tablets in half as they are 600 mg tablets. ? ?I have also ordered labs to check your vitamin D level. I will call you if we need to add vitamin D supplementation based on these results. ? ?Please bring your home blood pressure cuff and recordings to your next visit, as well as a record of your home blood sugar readings. ? ?Please follow up in 8 weeks. ? ?Remember: If you have any questions or concerns, please call our clinic at [phone] between 9am-5pm and after hours call [phone] and ask for the internal medicine resident on call. If you feel you are having a medical emergency please call 911. ? ?Farrel Gordon, DO ? ?

## 2021-05-09 LAB — VITAMIN D 25 HYDROXY (VIT D DEFICIENCY, FRACTURES): Vit D, 25-Hydroxy: 16.5 ng/mL — ABNORMAL LOW (ref 30.0–100.0)

## 2021-05-09 MED ORDER — ERGOCALCIFEROL 10 MCG (400 UNIT) PO TABS: 2.0000 | ORAL_TABLET | Freq: Every day | ORAL | 2 refills | Status: DC

## 2021-05-09 NOTE — Assessment & Plan Note (Addendum)
Vitamin D level has not been checked since 2019, at which time it was 11.2.  Patient is not currently taking vitamin D supplementation. ?Assessment: Patient needs recheck of her vitamin D levels.   ?Plan: Vitamin D level obtained.  Will prescribe supplementation as indicated. ? ?ADDENDUM: Vitamin D level is 16.5. I will send in a prescription for vitamin D supplementation, 800 units daily and plan to recheck her level in about 3 months. Patient is aware and in agreement. ?

## 2021-05-09 NOTE — Addendum Note (Signed)
Addended by: Renato Battles on: 05/09/2021 01:20 PM ? ? Modules accepted: Orders ? ?

## 2021-05-09 NOTE — Progress Notes (Signed)
Internal Medicine Clinic Attending ? ?Case discussed with Dr. Marlou Sa  At the time of the visit.  We reviewed the resident?s history and exam and pertinent patient test results.  I agree with the assessment, diagnosis, and plan of care documented in the resident?s note.  ? ?Patient's vitamin D <20, so she will need replacement. Dr. Marlou Sa to order & notify patient. ?

## 2021-05-09 NOTE — Assessment & Plan Note (Signed)
Patient is not due for a HbA1c recheck at this time.  At this time she is only taking metformin 1000 mg once daily rather than twice, as she was recently having severe GI upset with the twice daily dosing.  She does check her sugar at home and says that her range is typically 120-170, with some readings in the low 100s.  She has not had symptomatic lows.  She states that she has made dietary changes such as decreasing her red meat intake significantly and eating more vegetables. ?Assessment: I would not make changes to her diabetes medication regimen at this time, though she is at risk for needing to increase her metformin dosing or starting a new agent such as SGLT2 inhibitor at her next visit based on her HbA1c and glucometer values. ?Plan: We will have patient follow-up in 8 weeks and check an HbA1c at that time. She will likely need adjustment of her regimen at that time. ?

## 2021-05-09 NOTE — Assessment & Plan Note (Signed)
BP elevated above goal at 132/59.  When speaking with patient regarding home blood pressure checks, she states that her systolic blood pressure at home is typically in the 130s. ?Assessment: Based on home BP measurements she is at goal.  She does not have a log with her today. ?Plan: I will not start patient on antihypertensives at this time.  I requested that at her next visit she bring her home blood pressure log and cuff for [name] to assess. ?

## 2021-05-15 ENCOUNTER — Telehealth: Payer: Self-pay

## 2021-05-15 NOTE — Telephone Encounter (Signed)
I am not familiar with those being common adverse effects from gabapentin. I think it would be reasonable for her to try to restart it as outlined in Dr. Randel Pigg note from 4/25. If symptoms reoccur, then I recommend follow up in the clinic for re-evaluation.

## 2021-05-15 NOTE — Telephone Encounter (Signed)
Return pt's call - stated she was prescribed Gabapentin last week (she saw Dr Marlou Sa 4/25). She took it x 5 days. And on Sunday, she developed a "knot" on the back of her right thigh, her left thigh began to "shake" and develop a h/a, right side of her head. She has not taken the  med since then and now she stated the side effects are not as prominent. And the back pain continues. ? ?

## 2021-05-15 NOTE — Telephone Encounter (Signed)
Thank you :)

## 2021-05-15 NOTE — Telephone Encounter (Signed)
Pt was called and informed of Dr Chase Picket response. Pt stated she will not restart taking Gabapentin. Stated she felt like she was having a heart attack or stroke; afraid she might get a blood clot in her leg. Offer pt an appt -stated she can come next week. Call transferred to front office - appt schedule w/Dr [place] [facility] on 5/4. ?

## 2021-05-15 NOTE — Telephone Encounter (Signed)
Requesting to speak with a nurse about side effects on gabapentin (NEURONTIN) 600 MG tablet. Please call pt back.  ?

## 2021-05-17 ENCOUNTER — Other Ambulatory Visit: Payer: Self-pay

## 2021-05-17 ENCOUNTER — Ambulatory Visit: Payer: 59 | Admitting: Internal Medicine

## 2021-05-17 ENCOUNTER — Encounter: Payer: Self-pay | Admitting: Internal Medicine

## 2021-05-17 VITALS — BP 171/86 | HR 66 | Temp 98.4°F | Resp 28 | Ht 61.0 in | Wt 151.2 lb

## 2021-05-17 DIAGNOSIS — B0229 Other postherpetic nervous system involvement: Secondary | ICD-10-CM

## 2021-05-17 DIAGNOSIS — I10 Essential (primary) hypertension: Secondary | ICD-10-CM

## 2021-05-17 DIAGNOSIS — M549 Dorsalgia, unspecified: Secondary | ICD-10-CM

## 2021-05-17 MED ORDER — LIDOCAINE 5 % EX PTCH: 1.0000 | MEDICATED_PATCH | Freq: Two times a day (BID) | CUTANEOUS | 0 refills | Status: DC

## 2021-05-17 NOTE — Assessment & Plan Note (Signed)
Patient is still suffering from pain consistent with post-herpetic neuralgia over her right flank. She says that it itches and burns intermittently. Pain comes and goes, might have gotten a little bit better but she was concerned about the cause of it since she doesn't have a rash anymore. Education and handouts on PHN provided. She tried gabapentin which helped but also gave her the sensation of restless leg and she said it cause cramping in her legs that stopped when she stopped taking it. Does not want to restart it. Will try lidocaine patches at this time and see if they help. If not can try TCAs however given the worse side effect profile and her poor response to gabapentin which is comparatively well tolerated I am hesitant to start these today. Will start with conservative treatment and see if her pain improves. ?- work restrictions provided at patient request ?- lidocaine patch ?

## 2021-05-17 NOTE — Progress Notes (Signed)
? ?  CC: back pain ? ?HPI: ? ?ErinErin Lawson is a 61 y.o. PMH noted below, who presents to the [facility] with complaints of back pain. To see the management of his acute and chronic conditions, please refer to the A&P note under the encounters tab.  ? ?Past Medical History:  ?Diagnosis Date  ? Anemia   ? Anxiety   ? At increased risk for cardiovascular disease 08/29/2020  ? Breast cancer ([place])   ? Cancer ([place]) 10/2018  ? left breast IDC  ? Chest pain 05/18/2012  ? DM type 2 (diabetes mellitus, type 2) ([place]) 05/18/2012  ? GERD (gastroesophageal reflux disease)   ? OTC prn  ? Hypertension   ? Pain in paraspinal region 03/30/2021  ? Personal history of chemotherapy   ? Personal history of radiation therapy   ? ?Review of Systems:  positive for back pain, itching, burning, negative for skin rash ? ?Physical Exam: ?Gen: middle aged woman in NAD ?HEENT: normocephalic atraumatic, MMM ?CV: RRR, no m/r/g   ?Resp: CTAB, normal WOB  ?GI: soft, nontender ?MSK: moves all extremities without difficulty, tenderness along the right flank ?Skin:warm and dry ?Neuro:alert answering questions appropriately ?Psych: normal affect ? ? ?Assessment & Plan:  ? ?See Encounters Tab for problem based charting. ? ?Patient discussed with Dr. Philipp Ovens  ? ?

## 2021-05-17 NOTE — Patient Instructions (Signed)
Erin Lawson ? ?It was a pleasure seeing you in the clinic today.  ? ?We talked about your back pain, your diabetes, and your blood pressure ? ?Back pain- this is something called post-herpetic neuralgia that happens after the shingles. I have prescribed a numbing patch you can try on the area.  ? ?Blood pressure- please bring your home BP log in to your next visit ? ?Please call our clinic at [phone] if you have any questions or concerns. The best time to call is Monday-Friday from 9am-4pm, but there is someone available 24/7 at the same number. If you need medication refills, please notify your pharmacy one week in advance and they will send [name] a request. ?  ?Thank you for letting us take part in your care. We look forward to seeing you next time! ? ?

## 2021-05-17 NOTE — Assessment & Plan Note (Signed)
Patient did not bring BP log today. She is adamant that her BP at home is closer to the 130s.  ?- instructed to bring BP log at f/u ?

## 2021-05-18 ENCOUNTER — Inpatient Hospital Stay: Payer: 59 | Attending: Hematology and Oncology | Admitting: Hematology and Oncology

## 2021-05-18 DIAGNOSIS — Z9221 Personal history of antineoplastic chemotherapy: Secondary | ICD-10-CM | POA: Insufficient documentation

## 2021-05-18 DIAGNOSIS — Z923 Personal history of irradiation: Secondary | ICD-10-CM | POA: Diagnosis not present

## 2021-05-18 DIAGNOSIS — C50212 Malignant neoplasm of upper-inner quadrant of left female breast: Secondary | ICD-10-CM | POA: Diagnosis present

## 2021-05-18 DIAGNOSIS — Z171 Estrogen receptor negative status [ER-]: Secondary | ICD-10-CM | POA: Insufficient documentation

## 2021-05-18 NOTE — Assessment & Plan Note (Signed)
10/26/2018:Patient palpated a left breast mass x 5 weeks. Mammogram showed a 2.8cm mass with calcifications spanning 3.2cm in the left breast and two mildly thickened axillary lymph nodes up to 0.4cm. Biopsy showed IDC, grade 3, HER-2 + (3+), ER/PR -, Ki67 50%, and no evidence of carcinoma in the left axilla.? ?? ?Treatment plan: ?1. Neoadjuvant chemotherapy with Hoover Perjeta ?6 cycles?(completed on 03/05/19)?followed by Herceptin?Perjeta?maintenance for 1 year completed 11/08/2019 ?2. 04/21/2019:Left lumpectomy ([facility]): multiple foci of residual carcinoma?(0.25 cm in greatest dimension), clear margins, 4 left axillary lymph nodes negative.??Repeat prognostic panel ER negative PR negative, Ki-67 40%, HER-2 positive ?3. Followed by adjuvant radiation therapy?completed 06/22/2019 ?------------------------------------------------------------------------------------------------------------------------------------------------------------- ?Current treatment:?Surveillance ?Patient continues to work full-time in [employer] system.  ??? ?Breast Cancer Surveillance: ?1. Breast Exam: 05/18/21 ?2. Mammograms and ultrasound 04/19/2021: Benign, ultrasound showed fat necrosis, breast density category B ?? ?Return to clinic in 1 year for follow-up ?

## 2021-05-29 NOTE — Addendum Note (Signed)
Addended by: Jodean Lima on: 05/29/2021 09:09 AM ? ? Modules accepted: Level of Service ? ?

## 2021-05-29 NOTE — Progress Notes (Signed)
Internal Medicine Clinic Attending ° °Case discussed with Dr. DeMaio  At the time of the visit.  We reviewed the resident’s history and exam and pertinent patient test results.  I agree with the assessment, diagnosis, and plan of care documented in the resident’s note. ° ° °

## 2021-06-04 ENCOUNTER — Encounter: Payer: Self-pay | Admitting: Student

## 2021-06-04 ENCOUNTER — Ambulatory Visit: Payer: Self-pay

## 2021-06-04 ENCOUNTER — Other Ambulatory Visit: Payer: Self-pay | Admitting: Family Medicine

## 2021-06-04 ENCOUNTER — Ambulatory Visit (INDEPENDENT_AMBULATORY_CARE_PROVIDER_SITE_OTHER): Payer: 59 | Admitting: Student

## 2021-06-04 VITALS — BP 179/82 | HR 71 | Temp 97.9°F | Ht 61.0 in | Wt 150.1 lb

## 2021-06-04 DIAGNOSIS — M79631 Pain in right forearm: Secondary | ICD-10-CM

## 2021-06-04 DIAGNOSIS — I10 Essential (primary) hypertension: Secondary | ICD-10-CM | POA: Diagnosis not present

## 2021-06-04 DIAGNOSIS — E119 Type 2 diabetes mellitus without complications: Secondary | ICD-10-CM

## 2021-06-04 DIAGNOSIS — Z7984 Long term (current) use of oral hypoglycemic drugs: Secondary | ICD-10-CM

## 2021-06-04 LAB — POCT GLYCOSYLATED HEMOGLOBIN (HGB A1C): Hemoglobin A1C: 10.5 % — AB (ref 4.0–5.6)

## 2021-06-04 LAB — GLUCOSE, CAPILLARY: Glucose-Capillary: 274 mg/dL — ABNORMAL HIGH (ref 70–99)

## 2021-06-04 MED ORDER — METFORMIN HCL ER 500 MG PO TB24: 2000.0000 mg | ORAL_TABLET | Freq: Every day | ORAL | 3 refills | Status: DC

## 2021-06-04 MED ORDER — METFORMIN HCL ER 500 MG PO TB24: 1000.0000 mg | ORAL_TABLET | Freq: Every day | ORAL | 3 refills | Status: DC

## 2021-06-04 NOTE — Progress Notes (Signed)
Established Patient Office Visit  Subjective   Patient ID: Erin Lawson, female    DOB: 10/13/1960  Age: 61 y.o. MRN: [id number]  Chief Complaint  Patient presents with   Arm Pain    Right arm pain    Erin Lawson presents today for follow up of diabetes and hypertension. She is seeing occupational therapy for right arm pain from recent workplace injury.  Please refer to problem based charting for further details and assessment and plan of current problem and chronic medical conditions.    Patient Active Problem List   Diagnosis Date Noted   Right forearm pain 06/04/2021   Postherpetic neuralgia 05/08/2021   Globus pharyngeus 03/08/2021   Primary open angle glaucoma 08/29/2020   Health care maintenance 08/29/2020   Port-A-Cath in place 12/23/2018   Malignant neoplasm of upper-inner quadrant of left breast in female, estrogen receptor negative ([place]) 10/26/2018   Vitamin D deficiency 12/12/2017   Primary osteoarthritis of left knee 12/12/2017   Mild anemia 05/18/2012   DM type 2 (diabetes mellitus, type 2) ([place]) 05/18/2012   Essential hypertension 04/06/2007   KELOID 04/06/2007    Review of Systems  All other systems reviewed and are negative.    Objective:     BP (!) 179/82 (BP Location: Right Arm, Patient Position: Sitting, Cuff Size: Normal)   Pulse 71   Temp 97.9 F (36.6 C) (Oral)   Ht '5\' 1"'$  (1.549 m)   Wt 150 lb 1.6 oz (68.1 kg)   SpO2 100%   BMI 28.36 kg/m  BP Readings from Last 3 Encounters:  06/04/21 (!) 179/82  05/18/21 (!) 176/76  05/17/21 (!) 171/86      Physical Exam Constitutional:      General: She is not in acute distress.    Appearance: Normal appearance.  Cardiovascular:     Rate and Rhythm: Normal rate and regular rhythm.     Heart sounds: No murmur heard.   No friction rub. No gallop.  Pulmonary:     Effort: Pulmonary effort is normal.     Breath sounds: Normal breath sounds. No rhonchi or rales.  Abdominal:     General:  Abdomen is flat.     Palpations: Abdomen is soft.  Musculoskeletal:        General: No swelling.     Comments: TTP of the right lateral forearm with pain with supination  Skin:    General: Skin is warm and dry.  Neurological:     Mental Status: She is alert.     Results for orders placed or performed in visit on 06/04/21  Glucose, capillary  Result Value Ref Range   Glucose-Capillary 274 (H) 70 - 99 mg/dL  POC Hbg A1C  Result Value Ref Range   Hemoglobin A1C 10.5 (A) 4.0 - 5.6 %   HbA1c POC (<> result, manual entry)     HbA1c, POC (prediabetic range)     HbA1c, POC (controlled diabetic range)      Last hemoglobin A1c Lab Results  Component Value Date   HGBA1C 10.5 (A) 06/04/2021    The 10-year ASCVD risk score (Arnett DK, et al., 2019) is: 31.4%    Assessment & Plan:   Problem List Items Addressed This Visit       Cardiovascular and Mediastinum   Essential hypertension (Chronic)    BP remains elevated today. Reports BP at home has been elevated since injury at work. She did not bring log with her to work. Prior to  injury she reports that BP is better at home but elevated in office. It is possible she has a component of white coat hypertension. Pain is may also be contributing to her BP today. Will have her bring home BP monitor and discuss this further after she has PT for her arm pain.          Endocrine   DM type 2 (diabetes mellitus, type 2) ([place]) - Primary    A2c today increased to 10.5% from 8.2%. She states she has not been exercising or sticking to her diet since last visit particularly after injury at work about 1.5 weeks ago. She continues to have GI upset with metformin 1000 mg daily. I suggested with her elevated A1c that she try other diabetes medications such as SGT-2 inhibitors give hypertension or GLP-1 given elevated weight. She is adamant about not starting anything new for diabetes due to recalls. We had a discussion about the risk and benefits of  medication vs poor diabetes control. She remains uncomfortable to starting anything new.   She is motivated to resume diet and exercise changes as it has helped in the past. She prefers to continue her metformin. I discussed switching to extended release formulation of this to help with side effects. She did just refill her metformin. We discussed dividing her dosage into BID dosing if she does not want to pick up another prescription so soon. She will try this and if not helping switch to extended release metformin  Start metformin XR 1000 mg daily Follow up in 1 month, if tolerating XR metformin would titrate up to 2000 mg daily  Continue discussing other agents for diabetes for better glycemic control.         Relevant Medications   metFORMIN (GLUCOPHAGE-XR) 500 MG 24 hr tablet   Other Relevant Orders   POC Hbg A1C (Completed)     Other   Right forearm pain    Patient following with occupational therapy for recent work injury on 05/23/2021. 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 Has follow up with [facility]. X ray with degenerative changes. Plan for PT to help with pain. She will continue to follow up OT regarding this.         Return in about 1 month (around 07/05/2021).    Iona Beard, MD Discussed with Dr. Heber [place]

## 2021-06-04 NOTE — Assessment & Plan Note (Signed)
A2c today increased to 10.5% from 8.2%. She states she has not been exercising or sticking to her diet since last visit particularly after injury at work about 1.5 weeks ago. She continues to have GI upset with metformin 1000 mg daily. I suggested with her elevated A1c that she try other diabetes medications such as SGT-2 inhibitors give hypertension or GLP-1 given elevated weight. She is adamant about not starting anything new for diabetes due to recalls. We had a discussion about the risk and benefits of medication vs poor diabetes control. She remains uncomfortable to starting anything new.   She is motivated to resume diet and exercise changes as it has helped in the past. She prefers to continue her metformin. I discussed switching to extended release formulation of this to help with side effects. She did just refill her metformin. We discussed dividing her dosage into BID dosing if she does not want to pick up another prescription so soon. She will try this and if not helping switch to extended release metformin  Start metformin XR 1000 mg daily Follow up in 1 month, if tolerating XR metformin would titrate up to 2000 mg daily  Continue discussing other agents for diabetes for better glycemic control.

## 2021-06-04 NOTE — Patient Instructions (Addendum)
It was a pleasure seeing you in clinic today  Blood pressure  Please keep a log and bring to you next visit   Diabetes  Your A1c is 10.5% today up from 8%, it is very important you continue you medications  Please try extended release metformin 1000 mg daily to see if this helps with side effects If you want to take your prior metformin I would recommend taking this 500 mg in the morning and 500 mg in evening to see if that helps with symptoms   Right forearm pain Your xrays did not show any fractures. It appears you have strain of the muscles of your right forearm from an injury at work on 05/23/2021. Continue follow up with occupational health with PT and pain medications   Follow up in 1 months

## 2021-06-04 NOTE — Assessment & Plan Note (Signed)
BP remains elevated today. Reports BP at home has been elevated since injury at work. She did not bring log with her to work. Prior to injury she reports that BP is better at home but elevated in office. It is possible she has a component of white coat hypertension. Pain is may also be contributing to her BP today. Will have her bring home BP monitor and discuss this further after she has PT for her arm pain.

## 2021-06-04 NOTE — Assessment & Plan Note (Signed)
Patient following with occupational therapy for recent work injury on 05/23/2021. 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 Has follow up with [facility] and [facility]. X ray with degenerative changes. Plan for PT to help with pain. She will continue to follow up OT regarding this.

## 2021-06-05 IMAGING — MG MM PLC BREAST LOC DEV 1ST LESION INC MAMMO GUIDE*L*
8 of 11 series · 8 of 15 positions shown · non-contrast
Comparison: Previous exam(s).

CLINICAL DATA: 58-year-old female presenting for radioactive seed
localization of the left breast prior to lumpectomy.

EXAM:
MAMMOGRAPHIC GUIDED RADIOACTIVE SEED LOCALIZATION OF THE LEFT BREAST

[L CC (1 of 4)]
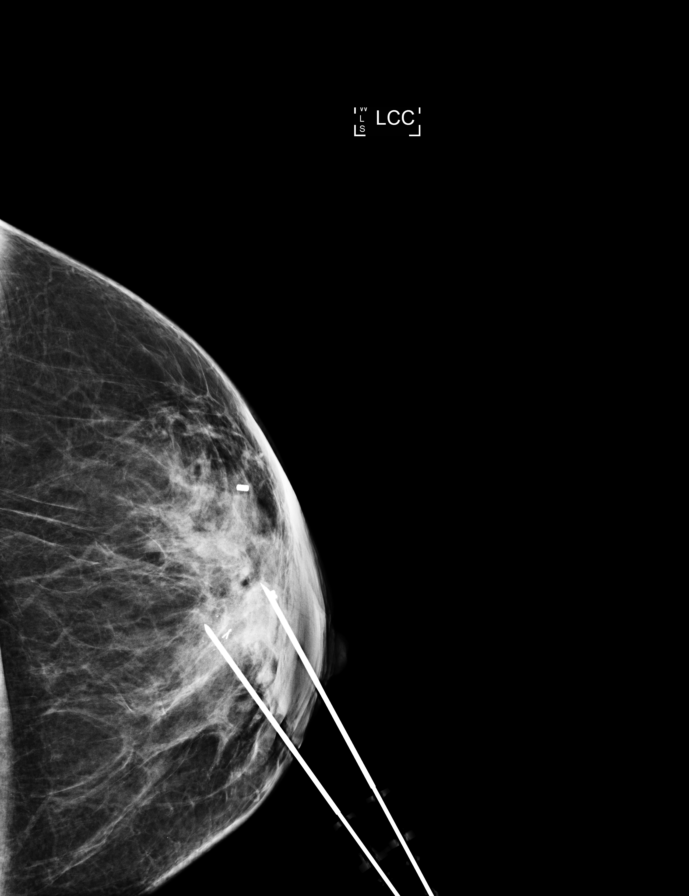

[L ML (1 of 4)]
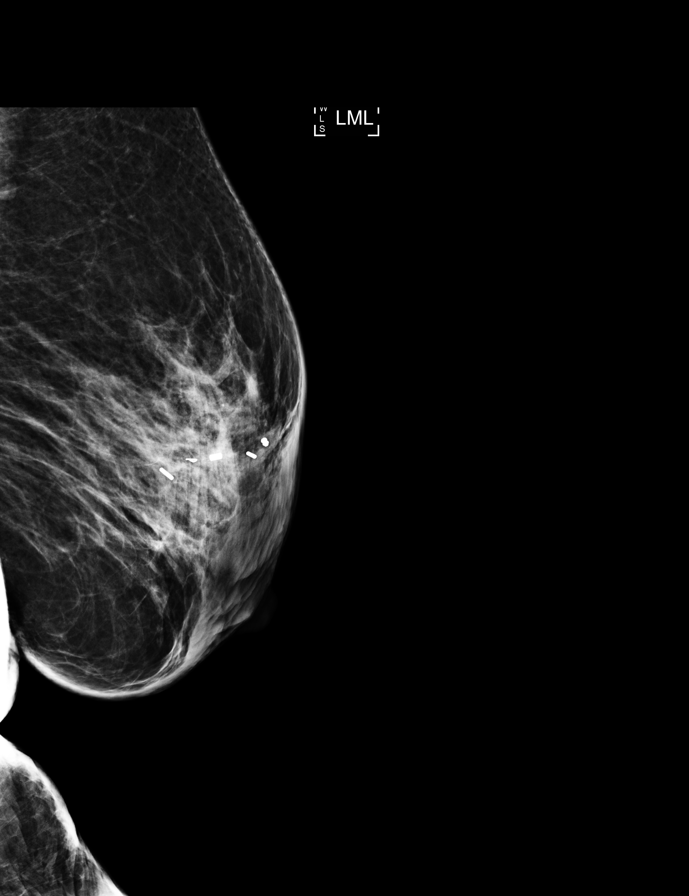

[L ML (2 of 4)]
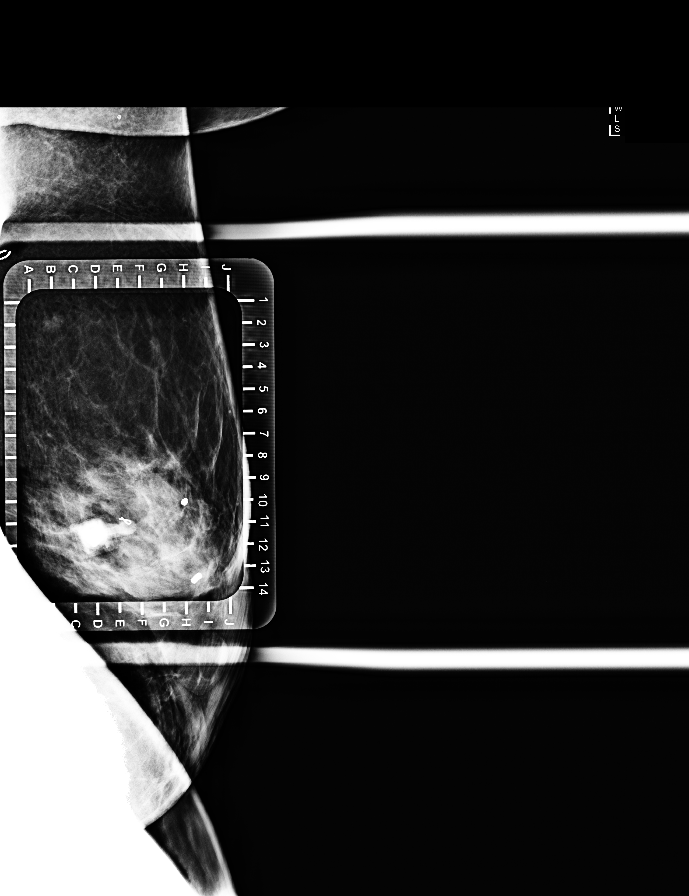

[L CC (2 of 4)]
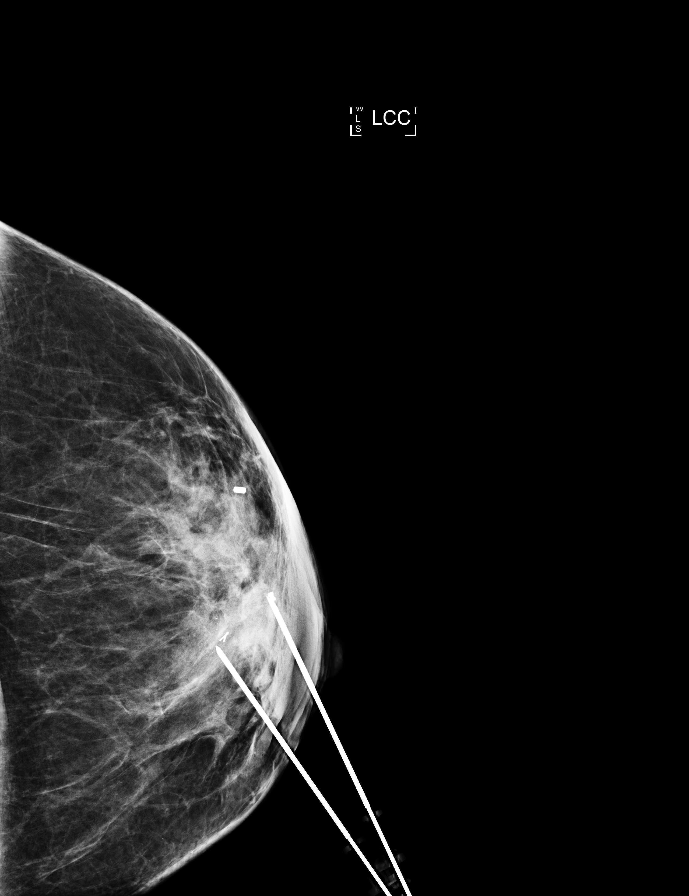

[L ML (3 of 4)]
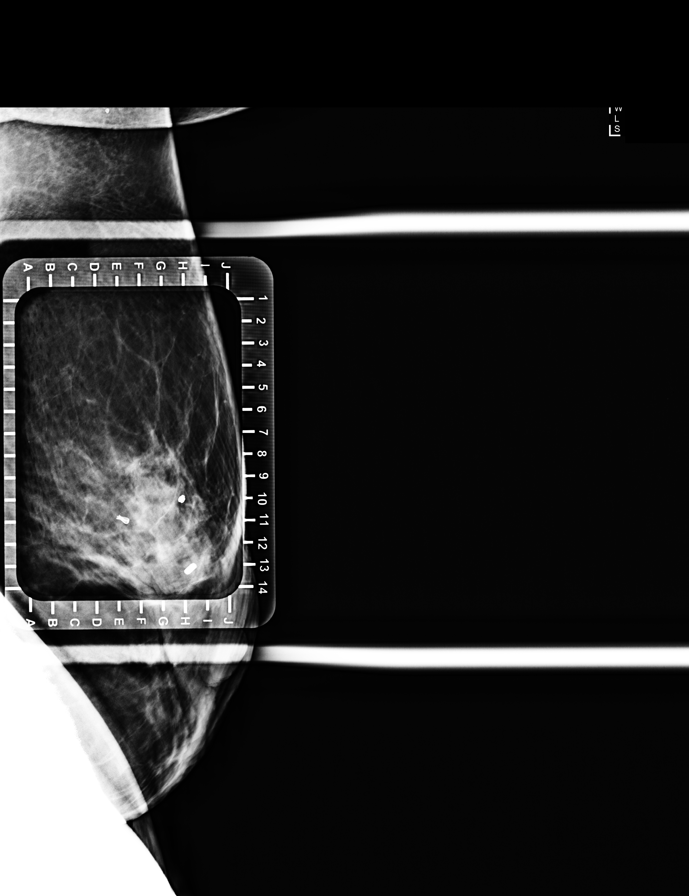

[L CC (3 of 4)]
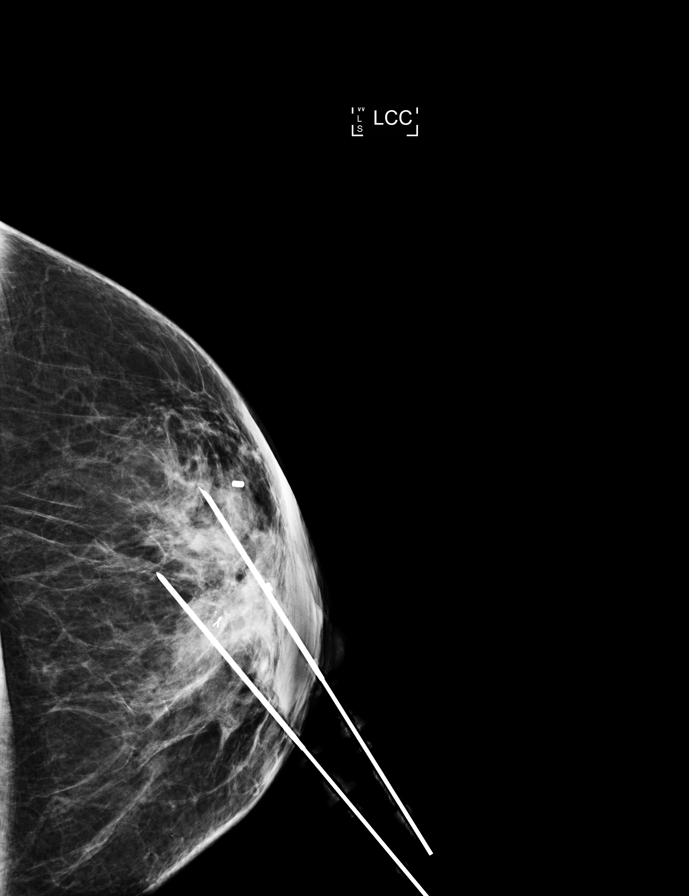

[L ML (4 of 4)]
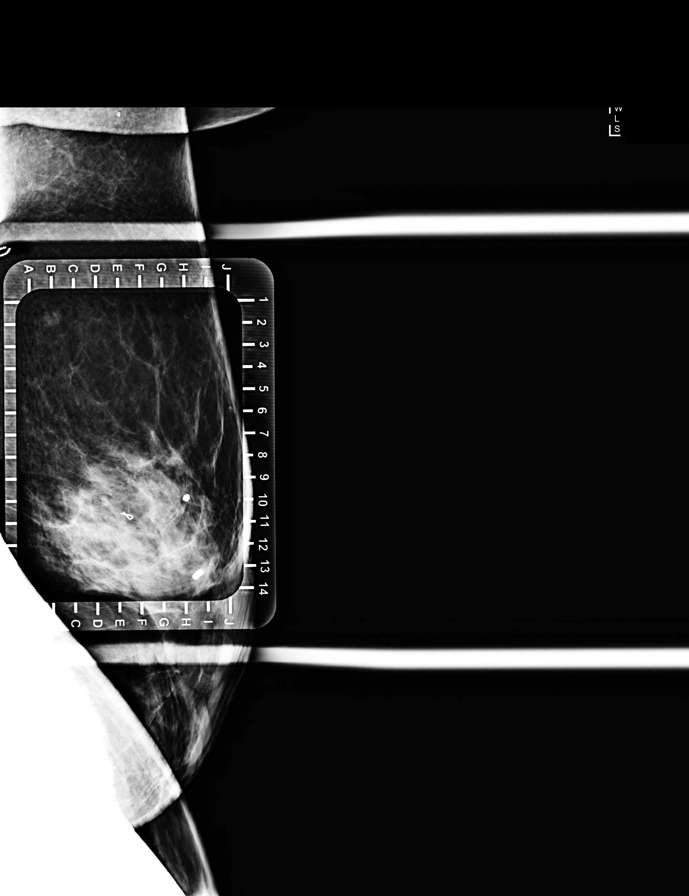

[L CC (4 of 4)]
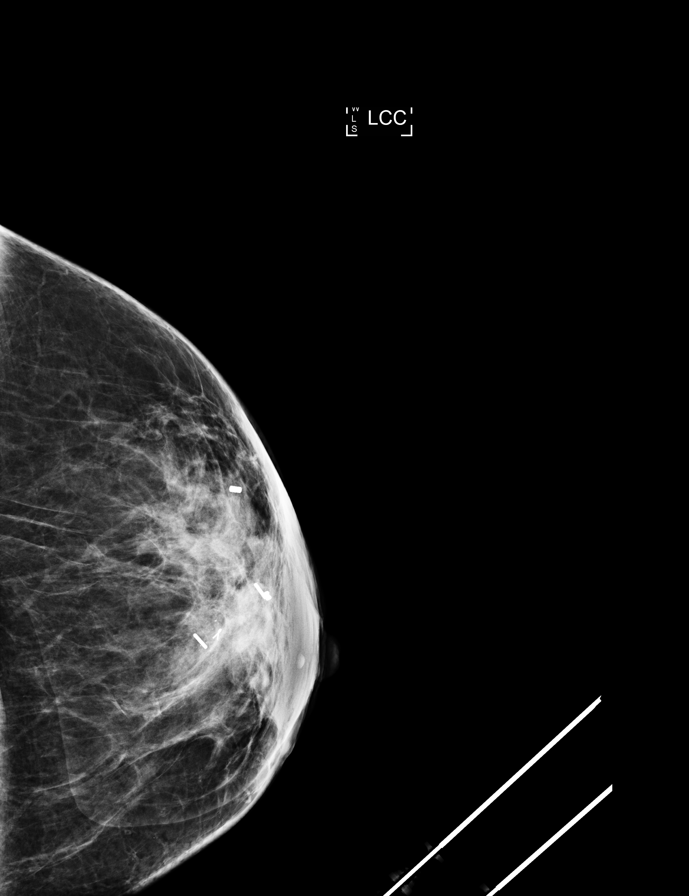

[8 of 15 positions shown; findings below may reference images not displayed]

FINDINGS: Patient presents for radioactive seed localization prior to left
breast lumpectomy. I met with the patient and we discussed the
procedure of seed localization including benefits and alternatives.
We discussed the high likelihood of a successful procedure. We
discussed the risks of the procedure including infection, bleeding,
tissue injury and further surgery. We discussed the low dose of
radioactivity involved in the procedure. Informed, written consent
was given.

The usual time-out protocol was performed immediately prior to the
procedure.

Using mammographic guidance, sterile technique, 1% lidocaine and an
U-WW6 radioactive seed, the dumbbell-shaped biopsy marking clip in
the superior anterior left breast was localized using a medial
approach. The follow-up mammogram images confirm the seed in the
expected location and were marked for Dr. Ng.

Follow-up survey of the patient confirms presence of the radioactive
seed.

Order number of U-WW6 seed:  [id number].

Total activity:  0.247 millicuries reference Date: 03/29/2019

Using mammographic guidance, sterile technique, 1% lidocaine and an
U-WW6 radioactive seed, the ribbon biopsy marking clip in the
superior left breast was localized using a medial approach. The
follow-up mammogram images confirm the seed in the expected location
and were marked for Dr. Ng.

Follow-up survey of the patient confirms presence of the radioactive
seed.

Order number of U-WW6 seed:  [id number].

Total activity:  0.247 millicuries reference Date: 03/29/2019

The patient tolerated the procedure well and was released from the
[REDACTED]. She was given instructions regarding seed removal.
IMPRESSION: Radioactive seed localization of 2 sites in the left breast. No
apparent complications.

## 2021-06-05 NOTE — Progress Notes (Signed)
Internal Medicine Clinic Attending  Case discussed with the resident at the time of the visit.  We reviewed the resident's history and exam and pertinent patient test results.  I agree with the assessment, diagnosis, and plan of care documented in the resident's note.  

## 2021-06-07 ENCOUNTER — Encounter: Payer: Self-pay | Admitting: Internal Medicine

## 2021-06-21 ENCOUNTER — Other Ambulatory Visit: Payer: Self-pay | Admitting: Hematology and Oncology

## 2021-06-21 DIAGNOSIS — Z9889 Other specified postprocedural states: Secondary | ICD-10-CM

## 2021-07-04 ENCOUNTER — Encounter: Payer: 59 | Admitting: Student

## 2021-07-06 ENCOUNTER — Ambulatory Visit
Admission: RE | Admit: 2021-07-06 | Discharge: 2021-07-06 | Disposition: A | Discharge status: Home / Self Care | Payer: 59 | Source: Ambulatory Visit | Attending: Hematology and Oncology | Admitting: Hematology and Oncology

## 2021-07-06 DIAGNOSIS — Z9889 Other specified postprocedural states: Secondary | ICD-10-CM

## 2021-07-10 ENCOUNTER — Ambulatory Visit (INDEPENDENT_AMBULATORY_CARE_PROVIDER_SITE_OTHER): Payer: 59 | Admitting: Student

## 2021-07-10 ENCOUNTER — Encounter: Payer: Self-pay | Admitting: Student

## 2021-07-10 ENCOUNTER — Telehealth: Payer: Self-pay

## 2021-07-10 VITALS — BP 168/92 | HR 79 | Temp 98.2°F | Resp 24 | Ht 61.0 in | Wt 148.9 lb

## 2021-07-10 DIAGNOSIS — R221 Localized swelling, mass and lump, neck: Secondary | ICD-10-CM

## 2021-07-10 DIAGNOSIS — R590 Localized enlarged lymph nodes: Secondary | ICD-10-CM | POA: Diagnosis not present

## 2021-07-10 MED ORDER — CLINDAMYCIN HCL 300 MG PO CAPS
300.0000 mg | ORAL_CAPSULE | Freq: Three times a day (TID) | ORAL | 0 refills | Status: DC
Start: 2021-07-10 — End: 2022-06-04

## 2021-07-10 MED ORDER — METHYLPREDNISOLONE 4 MG PO TBPK: ORAL_TABLET | Freq: Every day | ORAL | 0 refills | Status: DC

## 2021-07-10 NOTE — Assessment & Plan Note (Signed)
Patient presented for an acute visit for neck swelling. She states that last night at work (works night shift), she began noticing that her neck was becoming more swollen and felt like her throat was closing. It was not severe last night and thus she did not go to the ED. She went to sleep earlier today and woke up around noon. At this time, she began noticing more swelling, continued to feel like her throat was closing in, and began having some pain with swallowing. She has also noticed that recently she has had dry mouth.  She reports that the only new medications she has taken are multivitamins, elderberry extract, and black seed oil (all of which she previously took about 3 years ago without any issues). Otherwise, no other recent changes.  Of note, patient does have history of poorly controlled T2DM with last A1c of 10%.   On exam, she has poor dentition with dental caries noted. She has some mild pharyngeal erythema but otherwise no tonsillar swelling, erythema, or abscess noted. Uvula is midline with no swelling. She does have unilateral tender cervical lymphadenopathy on the left side.   Given history of uncontrolled T2DM and poor dentition with caries, I am concerned for a deep tissue infection in the head and neck. She is maintaining her airway well with SpO2 99%, so less concerned for imminent respiratory issues. I prescribed clindamycin 300mg  TID for an 8-day course to cover possible bacterial infection. I have also prescribed a 5-day course of medrol to help with decreasing swelling and improve her sensation of shortness of breath. Lastly, I have ordered a CT soft tissue head and neck with and without contrast to further evaluate for potential infection.   Given patient's history of dry mouth, this could possibly be sialoadenitis/sialolithiasis. I have recommended to suck on sour candy to help generate more salivary gland production to help evacuate any possible stone that could be causing her  symptoms.  Plan: -clindamycin 300mg  TID for 8 days -medrol 4mg  daily for 5 days -f/u CT soft tissue head and neck with and without contrast -sour candies to help with saliva production -f/u in 1 week or sooner if needed (provided return precautions to ED as well)

## 2021-07-11 ENCOUNTER — Telehealth: Payer: Self-pay | Admitting: *Deleted

## 2021-07-11 ENCOUNTER — Telehealth: Payer: Self-pay

## 2021-07-11 NOTE — Telephone Encounter (Signed)
Fax from [facility] to clarify methylPREDNISolone (MEDROL DOSEPAK) 4 MG TBPK tablet   "Do you want '4mg'$  tab daily for 5 days or a dose pak, which is 21 tabs taken over 6 days".  Thanks

## 2021-07-11 NOTE — Telephone Encounter (Signed)
Dr Allyson Sabal can you send new rx for the dose pak to [facility]?

## 2021-07-11 NOTE — Telephone Encounter (Unsigned)
RTC to patient pharmacy will not fill the Steroid ordered for patient question about dosing and patient has additonal information for the test that has been ordered for her.

## 2021-07-11 NOTE — Telephone Encounter (Signed)
Verbal order given to the pharmacist for Methylprednisolone dose pak 21 tabs over 6 days.

## 2021-07-11 NOTE — Telephone Encounter (Signed)
Pt is requesting a call back .Erin Lawson She stated she needs to speak to a RN about the test that was ordered  for her

## 2021-07-16 ENCOUNTER — Ambulatory Visit (INDEPENDENT_AMBULATORY_CARE_PROVIDER_SITE_OTHER): Payer: 59 | Admitting: Student

## 2021-07-16 ENCOUNTER — Other Ambulatory Visit: Payer: Self-pay | Admitting: Internal Medicine

## 2021-07-16 ENCOUNTER — Other Ambulatory Visit: Payer: Self-pay

## 2021-07-16 ENCOUNTER — Encounter: Payer: Self-pay | Admitting: Student

## 2021-07-16 VITALS — BP 158/83 | HR 92 | Temp 98.0°F | Ht 61.0 in | Wt 146.4 lb

## 2021-07-16 DIAGNOSIS — Z7984 Long term (current) use of oral hypoglycemic drugs: Secondary | ICD-10-CM

## 2021-07-16 DIAGNOSIS — I10 Essential (primary) hypertension: Secondary | ICD-10-CM

## 2021-07-16 DIAGNOSIS — K1120 Sialoadenitis, unspecified: Secondary | ICD-10-CM | POA: Diagnosis not present

## 2021-07-16 DIAGNOSIS — E119 Type 2 diabetes mellitus without complications: Secondary | ICD-10-CM

## 2021-07-16 NOTE — Assessment & Plan Note (Addendum)
Patient has elevated blood pressures in the 150s-170s. She presents to the clinic with elevated blood pressures in the 150s today as well. Patient is encouraged to start new blood pressure medication but patient declined to start any medications given that she is scared to start new meds. Patient is educated about the importance of watching her blood pressure. We came to an understanding that she needs to watch her blood pressure  Plan: -give handouts on ARBs for the patient to read and see if she is willing to try medications  -Refer patient to the Exercise program  -Have patient continue to measure blood pressure at home with goal less than 130/80

## 2021-07-16 NOTE — Patient Instructions (Signed)
Ms. Klee, thank you for your visit today, here are the instructions for your visit:  Continue to finish your antibiotic and your steroids.  Continue to finish your steroids. Continue to measure your blood pressure at home.  Wait for phone call for CT scan and your exercise program  Continue to take all prescribed medications as prescribed. Remember you can take imodium with metformin for any stomach issues.  Follow-up in 2 weeks  Read information about medications

## 2021-07-16 NOTE — Assessment & Plan Note (Signed)
Patient has redness to right buccal area and tenderess to parotid gland area on right side. There is also right sided lymphadenopathy with tenderness. Patient also complains of a dry mouth. She notes that the right sided ear pain radiates to her temporal area giving her a headache. This likely is from parotitis.  Plan: -Obtain CT Head and Neck soft tissue -Continue her Clindamycin and her medrol dose pack -Follow up in 2 weeks

## 2021-07-16 NOTE — Progress Notes (Signed)
CC: Neck Swelling and Right ear pain  HPI:  [name] is a 61 y.o. with a past medical history of Type II DM, hypertension, glaucoma, and anxiety who presents to the clinic today to follow up for neck swelling. Patient was last seen in the clinic on 07/10/21/ for neck swelling and was given a medrol dose pack and clindamycin and told to follow up. Patient today notes that she has gotten better with her eating and drinking and feels that she has gotten better. She states that she did start taking her antibiotic the day after her appointment on 06/27. She states that she was not able to get her steroid until 07/02 and therefore this is just day 2 of 5 of her steroids. She notes that today she still feels as if her left side of her neck is still swollen and that there is pain under her right ear. She also states that she is having a dry mouth. She notes that her pain from under her ear radiates up to her right head. She denies any trouble swallowing or trouble eating or breathing.   I also address her diabetes and hypertension with her. She notes that she does check her blood pressure at home and states that it is usually in the 140s at home.  Past Medical History:  Diagnosis Date   Anemia    Anxiety    At increased risk for cardiovascular disease 08/29/2020   Breast cancer ([place])    Cancer ([place]) 10/2018   left breast IDC   Chest pain 05/18/2012   DM type 2 (diabetes mellitus, type 2) ([place]) 05/18/2012   GERD (gastroesophageal reflux disease)    OTC prn   Hypertension    Pain in paraspinal region 03/30/2021   Personal history of chemotherapy    Personal history of radiation therapy     Current Outpatient Medications:    Accu-Chek Softclix Lancets lancets, Check blood sugar 3 times a day, Disp: 100 each, Rfl: 11   blood glucose meter kit and supplies, Dispense based on patient and insurance preference. Use up to four times daily as directed. (FOR ICD-10 E10.9, E11.9)., Disp: 1  each, Rfl: 0   clindamycin (CLEOCIN) 300 MG capsule, Take 1 capsule (300 mg total) by mouth 3 (three) times daily., Disp: 24 capsule, Rfl: 0   Continuous Blood Gluc Sensor (FREESTYLE LIBRE 2 SENSOR) MISC, Use to check blood sugar at least 6 times a day before and 2 hours after meals, Disp: 2 each, Rfl: 11   diclofenac Sodium (VOLTAREN) 1 % GEL, Apply 4 g topically 2 (two) times daily as needed., Disp: 4 g, Rfl: 0   Ergocalciferol 10 MCG (400 UNIT) TABS, Take 2 tablets by mouth daily., Disp: 60 tablet, Rfl: 2   fluticasone (FLONASE) 50 MCG/ACT nasal spray, Place 2 sprays into both nostrils daily., Disp: 9.9 mL, Rfl: 0   glucose blood (ACCU-CHEK GUIDE) test strip, Check blood sugar 3 times per day, Disp: 100 each, Rfl: 11   latanoprost (XALATAN) 0.005 % ophthalmic solution, Place 1 drop into both eyes daily., Disp: , Rfl:    lidocaine (LIDODERM) 5 %, Place 1 patch onto the skin every 12 (twelve) hours. Remove & Discard patch within 12 hours or as directed by MD, Disp: 10 patch, Rfl: 0   loratadine (CLARITIN) 10 MG tablet, Take 1 tablet (10 mg total) by mouth daily., Disp: 30 tablet, Rfl: 0   magic mouthwash (lidocaine, diphenhydrAMINE, alum & mag hydroxide) suspension, Swish  and spit 5 mLs 3 (three) times daily as needed for mouth pain., Disp: 360 mL, Rfl: 0   metFORMIN (GLUCOPHAGE-XR) 500 MG 24 hr tablet, Take 2 tablets (1,000 mg total) by mouth daily with breakfast., Disp: 180 tablet, Rfl: 3   methylPREDNISolone (MEDROL DOSEPAK) 4 MG TBPK tablet, Take by mouth daily at 6 (six) AM., Disp: 5 tablet, Rfl: 0   Review of Systems:   Face: Patient endorses right cheek pain  Mouth: Patient endorses left cheek swelling  HENT: Patient endorses right sided headache and right ear ache.  Physical Exam:  Vitals:   07/16/21 0857 07/16/21 1009  BP: (!) 152/75 (!) 158/83  Pulse: 89 92  Temp: 98 F (36.7 C)   TempSrc: Oral   SpO2: 98%   Weight: 146 lb 6.4 oz (66.4 kg)   Height: '5\' 1"'  (1.549 m)     General: Alert and orientated x3. Patient is sitting comfortably in the room  Eyes:  EOM intact  HENT: Right buccal erythema. Right ear canal with irritation. Right parotid gland with tenderness to palpation Left ear unremarkable Mouth: Dry mucus membranes and poor dentition noted. Neck: Right tender lymphadenopathy.  Cardio: Regular rate and rhythm, no murmurs, rubs or gallops. Pulmonary: Clear to ausculation bilaterally with no rales, rhonchi, and crackles   Assessment & Plan:   See Encounters Tab for problem based charting.  DM type 2 (diabetes mellitus, type 2) Patient's last A1c was 10.5. She notes that she feels that her metformin dose is too high at the current 1022m XR. She states that it gives her GI issues. She declines to start any new medications and So I do educate her about the important about her glucose concerns.   Plan: -Educated patient about the importance about glucose control -Give patient handouts about SGLT2 inhibitors for patient to read about new medications -Continue with metfomin XR 1005m -Follow up with patient on next appointment in September   Essential hypertension Patient has elevated blood pressures in the 150s-170s. She presents to the clinic with elevated blood pressures in the 150s today as well. Patient is encouraged to start new blood pressure medication but patient declined to start any medications given that she is scared to start new meds. Patient is educated about the importance of watching her blood pressure. We came to an understanding that she needs to watch her blood pressure  Plan: -give handouts on ARBs for the patient to read and see if she is willing to try medications  -Refer patient to the Exercise program  -Have patient continue to measure blood pressure at home with goal less than 130/80  Parotitis Patient has redness to right buccal area and tenderess to parotid gland area on right side. There is also right sided  lymphadenopathy with tenderness. Patient also complains of a dry mouth. She notes that the right sided ear pain radiates to her temporal area giving her a headache. This likely is from parotitis.  Plan: -Obtain CT Head and Neck soft tissue -Continue her Clindamycin and her medrol dose pack -Follow up in 2 weeks      Patient seen with Dr. WiReeves ForthDO PGY-1 Internal Medicine Resident  Pager:[phone]

## 2021-07-16 NOTE — Assessment & Plan Note (Addendum)
Patient's last A1c was 10.5. She notes that she feels that her metformin dose is too high at the current '1000mg'$  XR. She states that it gives her GI issues. She declines to start any new medications and So I do educate her about the important about her glucose concerns.   Plan: -Educated patient about the importance about glucose control -Give patient handouts about SGLT2 inhibitors for patient to read about new medications -Continue with metfomin XR '1000mg'$ , and use imodium for GI issues as needed  -Follow up with patient on next appointment in September

## 2021-07-17 NOTE — Progress Notes (Signed)
Internal Medicine Clinic Attending  I saw and evaluated the patient.  I personally confirmed the key portions of the history and exam documented by Dr. Jinwala and I reviewed pertinent patient test results.  The assessment, diagnosis, and plan were formulated together and I agree with the documentation in the resident's note.  

## 2021-07-21 ENCOUNTER — Telehealth: Payer: Self-pay

## 2021-07-21 NOTE — Telephone Encounter (Signed)
[facility] pt requesting call back to discuss referral for PREP

## 2021-07-23 ENCOUNTER — Telehealth: Payer: Self-pay

## 2021-07-23 NOTE — Telephone Encounter (Signed)
Call from pt Explained program to her as well as location Agreeable to starting on 08/13/21 M/W 1pm-215pm x 12 wks Will call her back closer to start date to complete intake

## 2021-07-24 NOTE — Progress Notes (Signed)
Internal Medicine Clinic Attending  I saw and evaluated the patient.  I personally confirmed the key portions of the history and exam documented by Dr. Patel and I reviewed pertinent patient test results.  The assessment, diagnosis, and plan were formulated together and I agree with the documentation in the resident's note.  

## 2021-07-30 ENCOUNTER — Telehealth: Payer: Self-pay | Admitting: Student

## 2021-07-30 ENCOUNTER — Ambulatory Visit (HOSPITAL_COMMUNITY)
Admission: RE | Admit: 2021-07-30 | Discharge: 2021-07-30 | Disposition: A | Discharge status: Home / Self Care | Payer: 59 | Source: Ambulatory Visit | Attending: Internal Medicine | Admitting: Internal Medicine

## 2021-07-30 DIAGNOSIS — E119 Type 2 diabetes mellitus without complications: Secondary | ICD-10-CM | POA: Diagnosis present

## 2021-07-30 DIAGNOSIS — K1120 Sialoadenitis, unspecified: Secondary | ICD-10-CM | POA: Insufficient documentation

## 2021-07-30 LAB — POCT I-STAT CREATININE: Creatinine, Ser: 0.6 mg/dL (ref 0.44–1.00)

## 2021-07-30 MED ORDER — IOHEXOL 300 MG/ML  SOLN
100.0000 mL | Freq: Once | INTRAMUSCULAR | Status: AC | PRN
  Administered 2021-07-30: 100 mL via INTRAVENOUS

## 2021-07-30 NOTE — Telephone Encounter (Signed)
I called the patient to follow-up on her CT results.  I informed her that there is no acute mass or infection.  I tell her that there may have been a history of parotitis given the fat densities on her CT scan.  She is understanding of the results.  She states that she is not understanding why she still is having the swelling.  She also reports having difficulty swallowing.  Patient is scheduled to see me tomorrow 07/18.  I will reevaluate her at that time.

## 2021-07-31 ENCOUNTER — Ambulatory Visit (INDEPENDENT_AMBULATORY_CARE_PROVIDER_SITE_OTHER): Payer: 59 | Admitting: Student

## 2021-07-31 ENCOUNTER — Encounter: Payer: Self-pay | Admitting: Student

## 2021-07-31 ENCOUNTER — Other Ambulatory Visit: Payer: Self-pay

## 2021-07-31 VITALS — BP 171/77 | HR 68 | Temp 98.6°F | Ht 61.0 in | Wt 147.4 lb

## 2021-07-31 DIAGNOSIS — R1310 Dysphagia, unspecified: Secondary | ICD-10-CM

## 2021-07-31 DIAGNOSIS — K1120 Sialoadenitis, unspecified: Secondary | ICD-10-CM

## 2021-07-31 DIAGNOSIS — I10 Essential (primary) hypertension: Secondary | ICD-10-CM | POA: Diagnosis not present

## 2021-07-31 MED ORDER — METHYLPREDNISOLONE 4 MG PO TBPK: ORAL_TABLET | Freq: Every day | ORAL | 0 refills | Status: DC

## 2021-07-31 MED ORDER — METHYLPREDNISOLONE 4 MG PO TBPK: ORAL_TABLET | ORAL | 0 refills | Status: DC

## 2021-07-31 NOTE — Assessment & Plan Note (Addendum)
Patient presents to the clinic with a 3-1/2-week concern of right buccal swelling.  Patient notes that this started as a swelling behind her ear, and now has gone down to her throat.  Patient has been seen in the clinic for the past 3-1/2 weeks for this.  She has been seen two times.  This is her third visit.  Patient was recently given clindamycin and a Medrol Dosepak which helped her symptoms.  Patient also had a CT scan, which did show a history of parotitis but no active mass or infection.  Given that patient has had recurrent parotitis, I deem it appropriate for her to see a ENT.  This could also be eosinophilic sialodochitis.  I looked back through her CBCs with differentials, and I did not see any evidence of elevated eosinophils.  Plan: - Prescribed Medrol Dosepak today - Refer patient to ENT

## 2021-07-31 NOTE — Patient Instructions (Addendum)
[place] you for allowing me to take part in your care today.  Here are your instructions.  1. Regarding your facial swelling, I will give your a medrol dose pack for you to take. Please take as directed.  2.I will refer you to ear, nose, and throat doctor to be evaluated.   3.I will do some lab work today. I will draw an ANA, anti Ro, and anti La antibody. This is to test for Sjogren's syndrome. Please await for the results. I will call you for the results  4.Regarding your high blood pressure, I will give you a handout about the type of medicine that I want you to start on. Please continue to do your lifestyle changes. Please continue to keep a log.  5.Please return if you start to develop fevers   Thank you, Dr. Posey Pronto  If you have any other questions please contact the internal medicine clinic at [phone]

## 2021-07-31 NOTE — Assessment & Plan Note (Signed)
Patient presents the clinic with concerns of trouble and painful swallowing.  Patient presents to the clinic with elevated blood pressures in the 180s.  On recheck it was 171/77.  On third recheck it had went down to 145/65.  Patient states that she is normally in the 140s and 130s.  She brings in a log which verifies this.  I informed her that these elevated blood pressures puts her at risk for heart disease, kidney disease, and stroke.  Patient understands this, but still refuses to get on medication.  She states that she rather do lifestyle modifications and would rather not get on medication at this time.  She understands the risks.  Plan: - Give patient handout about ARB's - Continue to monitor blood pressure at home - Continue the prep program and to make lifestyle modifications - Return to clinic in 1 month for blood pressure follow-up

## 2021-07-31 NOTE — Progress Notes (Signed)
CC: Painful swallowing  HPI:  [name] is a 61 y.o. female with a past medical history of diabetes, hypertension, and facial swelling who presents to the clinic for concerns of painful swallowing.  She states that she has been seen for this for the past 3-1/2 weeks.  This is her third visit.  Patient was initially seen for facial swelling, and was given clindamycin and a Medrol Dosepak.  Her symptoms got better.  They then returned again.  This prompted the patient to get a CT neck to visualize her soft tissues.  She has had a negative head and neck soft tissue CT.  She reports that she is now having trouble swallowing.  She describes this as a lump on the left side of her throat.  She states that for the past 2 weeks she has been drinking soups and eating Jell-O's for nutrition.  Patient reports that she is due to see a dentist in August.  Please see assessment and plan for full HPI.  Past Medical History:  Diagnosis Date   Anemia    Anxiety    At increased risk for cardiovascular disease 08/29/2020   Breast cancer ([place])    Cancer ([place]) 10/2018   left breast IDC   Chest pain 05/18/2012   DM type 2 (diabetes mellitus, type 2) ([place]) 05/18/2012   GERD (gastroesophageal reflux disease)    OTC prn   Hypertension    Pain in paraspinal region 03/30/2021   Personal history of chemotherapy    Personal history of radiation therapy      Current Outpatient Medications:    Accu-Chek Softclix Lancets lancets, Check blood sugar 3 times a day, Disp: 100 each, Rfl: 11   blood glucose meter kit and supplies, Dispense based on patient and insurance preference. Use up to four times daily as directed. (FOR ICD-10 E10.9, E11.9)., Disp: 1 each, Rfl: 0   clindamycin (CLEOCIN) 300 MG capsule, Take 1 capsule (300 mg total) by mouth 3 (three) times daily., Disp: 24 capsule, Rfl: 0   Continuous Blood Gluc Sensor (FREESTYLE LIBRE 2 SENSOR) MISC, Use to check blood sugar at least 6 times a day before  and 2 hours after meals, Disp: 2 each, Rfl: 11   diclofenac Sodium (VOLTAREN) 1 % GEL, Apply 4 g topically 2 (two) times daily as needed., Disp: 4 g, Rfl: 0   Ergocalciferol 10 MCG (400 UNIT) TABS, Take 2 tablets by mouth daily., Disp: 60 tablet, Rfl: 2   fluticasone (FLONASE) 50 MCG/ACT nasal spray, Place 2 sprays into both nostrils daily., Disp: 9.9 mL, Rfl: 0   glucose blood (ACCU-CHEK GUIDE) test strip, Check blood sugar 3 times per day, Disp: 100 each, Rfl: 11   latanoprost (XALATAN) 0.005 % ophthalmic solution, Place 1 drop into both eyes daily., Disp: , Rfl:    lidocaine (LIDODERM) 5 %, Place 1 patch onto the skin every 12 (twelve) hours. Remove & Discard patch within 12 hours or as directed by MD, Disp: 10 patch, Rfl: 0   loratadine (CLARITIN) 10 MG tablet, Take 1 tablet (10 mg total) by mouth daily., Disp: 30 tablet, Rfl: 0   magic mouthwash (lidocaine, diphenhydrAMINE, alum & mag hydroxide) suspension, Swish and spit 5 mLs 3 (three) times daily as needed for mouth pain., Disp: 360 mL, Rfl: 0   metFORMIN (GLUCOPHAGE-XR) 500 MG 24 hr tablet, Take 2 tablets (1,000 mg total) by mouth daily with breakfast., Disp: 180 tablet, Rfl: 3   methylPREDNISolone (MEDROL DOSEPAK) 4  MG TBPK tablet, Day 1: 8 mg PO before breakfast, 4 mg after lunch, 4 mg after dinner, and 8 mg at bedtime Day 2: 4 mg PO before breakfast, 4 mg after lunch, 4 mg after dinner, and 8 mg at bedtime Day 3: 4 mg PO before breakfast, 4 mg after lunch, 4 mg after dinner, and 4 mg at bedtime Day 4: 4 mg PO before breakfast, 4 mg after lunch, and 4 mg at bedtime Day 5: 4 mg PO before breakfast, and 4 mg at bedtime Day 6: 4 mg PO before breakfast, Disp: 21 tablet, Rfl: 0  Review of Systems:    Constitutional: Patient endorses face swelling Throat: Patient endorses painful swallowing   Physical Exam:  Vitals:   07/31/21 1006 07/31/21 1010  BP: (!) 182/75 (!) 171/77  Pulse: 69 68  Temp: 98.6 F (37 C)   TempSrc: Oral   SpO2:  100%   Weight: 147 lb 6.4 oz (66.9 kg)   Height: _0  (1.549 m)     General: Alert and orientated x3. Patient is sitting comfortably in the room  Eyes: EOM intact  Head: Normocephalic, atraumatic  Neck: No tender lymphadenopathy noted.  There is tenderness to the lateral sides of the thyroid bilaterally.  No thyroid enlargement. Face: Tenderness noted to right parotid area. ENT: TMs clear, nares clear, poor dentition noted.  No swelling noted to buccal mucosa.   Assessment & Plan:   Essential hypertension Patient presents the clinic with concerns of trouble and painful swallowing.  Patient presents to the clinic with elevated blood pressures in the 180s.  On recheck it was 171/77.  On third recheck it had went down to 145/65.  Patient states that she is normally in the 140s and 130s.  She brings in a log which verifies this.  I informed her that these elevated blood pressures puts her at risk for heart disease, kidney disease, and stroke.  Patient understands this, but still refuses to get on medication.  She states that she rather do lifestyle modifications and would rather not get on medication at this time.  She understands the risks.  Plan: - Give patient handout about ARB's - Continue to monitor blood pressure at home - Continue the prep program and to make lifestyle modifications - Return to clinic in 1 month for blood pressure follow-up  Parotitis Patient presents to the clinic with a 3-1/2-week concern of right buccal swelling.  Patient notes that this started as a swelling behind her ear, and now has gone down to her throat.  Patient has been seen in the clinic for the past 3-1/2 weeks for this.  She has been seen two times.  This is her third visit.  Patient was recently given clindamycin and a Medrol Dosepak which helped her symptoms.  Patient also had a CT scan, which did show a history of parotitis but no active mass or infection.  Given that patient has had recurrent  parotitis, I deem it appropriate for her to see a ENT.  This could also be eosinophilic sialodochitis.  I looked back through her CBCs with differentials, and I did not see any evidence of elevated eosinophils.  Plan: - Prescribed Medrol Dosepak today - Refer patient to ENT   Odynophagia Patient presents with a 1 week history of painful swallowing.  She thinks that she has a lump on the left side of her throat.  Patient states that this started last week.  She states that she has been  eating soups and Jell-O's because of her trouble swallowing.  Patient reports that a couple days ago she started eating solids.  Patient also reports that she has been having a dry mouth.  Patient reports that it hurts her very bad when she swallows.  Patient recently had a CT scan which was negative for any acute mass or infection.  On exam patient has tenderness lateral to her thyroid area bilaterally.  Patient has a normal-sized thyroid per my exam.  I feel no lymph nodes.  Given the negative CT and tenderness on exam, will refer to ENT.  Given her dry mouth, I will obtain antibodies for Sjogren's syndrome.  Plan: - Refer to ENT pending - ANA, anti-Ro, anti-La antibodies pending   Patient seen with Dr. Glenice Bow, DO PGY-1 Internal Medicine Resident  Pager: [phone]

## 2021-07-31 NOTE — Assessment & Plan Note (Signed)
Patient presents with a 1 week history of painful swallowing.  She thinks that she has a lump on the left side of her throat.  Patient states that this started last week.  She states that she has been eating soups and Jell-O's because of her trouble swallowing.  Patient reports that a couple days ago she started eating solids.  Patient also reports that she has been having a dry mouth.  Patient reports that it hurts her very bad when she swallows.  Patient recently had a CT scan which was negative for any acute mass or infection.  On exam patient has tenderness lateral to her thyroid area bilaterally.  Patient has a normal-sized thyroid per my exam.  I feel no lymph nodes.  Given the negative CT and tenderness on exam, will refer to ENT.  Given her dry mouth, I will obtain antibodies for Sjogren's syndrome.  Plan: - Refer to ENT pending - ANA, anti-Ro, anti-La antibodies pending

## 2021-08-01 ENCOUNTER — Telehealth: Payer: Self-pay | Admitting: Student

## 2021-08-01 LAB — SJOGRENS SYNDROME-B EXTRACTABLE NUCLEAR ANTIBODY: ENA SSB (LA) Ab: <0.2 AI (ref 0.0–0.9)

## 2021-08-01 LAB — SJOGRENS SYNDROME-A EXTRACTABLE NUCLEAR ANTIBODY: ENA SSA (RO) Ab: <0.2 AI (ref 0.0–0.9)

## 2021-08-01 LAB — ANTINUCLEAR ANTIBODIES, IFA: ANA Titer 1: NEGATIVE

## 2021-08-01 NOTE — Progress Notes (Signed)
Internal Medicine Clinic Attending  I saw and evaluated the patient.  I personally confirmed the key portions of the history and exam documented by Dr. Patel and I reviewed pertinent patient test results.  The assessment, diagnosis, and plan were formulated together and I agree with the documentation in the resident's note.  

## 2021-08-01 NOTE — Telephone Encounter (Signed)
Spoke to the patient on the phone today.  I informed the patient that her ANA, anti-Ro, anti-La antibodies were all negative.  Patient is understanding of these results.

## 2021-08-02 ENCOUNTER — Telehealth: Payer: Self-pay

## 2021-08-02 NOTE — Telephone Encounter (Signed)
Call to pt to schedule intake for PREP at [place] scheduled for 7/26 at 930a

## 2021-08-09 NOTE — Progress Notes (Signed)
[facility] PREP Evaluation  Patient Details  Name: Erin Lawson MRN: [id number] Date of Birth: 09/11/1960 Age: 61 y.o. PCP: Leigh Aurora, DO  Vitals:   08/08/21 0930  BP: (!) 160/82  Pulse: 72  SpO2: 97%  Weight: 146 lb 12.8 oz (66.6 kg)     [facility] Eval - 08/09/21 1600       [facility] "PREP" Location   [facility] "PREP" Location [facility]      Referral    Referring Provider Posey Pronto    Reason for referral Hypertension;Diabetes    Program Start Date 08/13/21   M/W 1pm-215pm x 12 wks     Measurement   Waist Circumference 36 inches    Hip Circumference 39 inches    Body fat 39.9 percent      Information for Trainer   Goals Get off metformin, get Hgb A1C below 7    Current Exercise none    Orthopedic Concerns OA left knee    Pertinent Medical History HTN, DM2    Current Barriers works at night    Medications that affect exercise Medication causing dizziness/drowsiness      Timed Up and Go (TUGS)   Timed Up and Go Low risk <9 seconds      Mobility and Daily Activities   I have no trouble taking out the trash. 4    I do housework such as vacuuming and dusting on my own without difficulty. 4    I can easily lift a gallon of milk (8lbs). 4    I can easily walk a mile. 4    I have no trouble reaching into high cupboards or reaching down to pick up something from the floor. 4    I do not have trouble doing out-door work such as Armed forces logistics/support/administrative officer, raking leaves, or gardening. 4      Mobility and Daily Activities   I feel younger than my age. 4    I feel independent. 4    I feel energetic. 3    I live an active life.  2    I feel strong. 4    I feel healthy. 4    I feel active as other people my age. 4      How fit and strong are you.   Fit and Strong Total Score 49            Past Medical History:  Diagnosis Date   Anemia    Anxiety    At increased risk for cardiovascular disease 08/29/2020   Breast cancer ([facility])    Cancer ([facility]) 10/2018   left breast IDC   Chest  pain 05/18/2012   DM type 2 (diabetes mellitus, type 2) ([facility]) 05/18/2012   GERD (gastroesophageal reflux disease)    OTC prn   Hypertension    Pain in paraspinal region 03/30/2021   Personal history of chemotherapy    Personal history of radiation therapy    Past Surgical History:  Procedure Laterality Date   BREAST LUMPECTOMY WITH RADIOACTIVE SEED AND SENTINEL LYMPH NODE BIOPSY Left 04/21/2019   Procedure: LEFT BREAST LUMPECTOMY WITH RADIOACTIVE SEED AND SENTINEL LYMPH NODE MAPPING;  Surgeon: Erroll Luna, MD;  Location: [facility];  Service: General;  Laterality: Left;   CESAREAN SECTION     PORTACATH PLACEMENT Right 11/11/2018   Procedure: INSERTION PORT-A-CATH WITH ULTRASOUND;  Surgeon: Erroll Luna, MD;  Location: [facility];  Service: General;  Laterality: Right;   utreine polyp  resected   Social History   Tobacco Use  Smoking Status Never  Smokeless Tobacco Never    Barnett Hatter 08/09/2021, 4:55 PM

## 2021-08-14 NOTE — Progress Notes (Signed)
[facility] PREP Weekly Session  Patient Details  Name: Erin Lawson MRN: [id number] Date of Birth: 06-Feb-1960 Age: 61 y.o. PCP: Leigh Aurora, DO  There were no vitals filed for this visit.   [facility] Weekly seesion - 08/14/21 1700       [facility] "PREP" Location   [facility] "PREP" [facility]      Weekly Session   Topic Discussed Goal setting and welcome to the program   scale of perceived exertion and target heart rate   Classes attended to date [place] 08/14/2021, 5:09 PM

## 2021-08-21 NOTE — Progress Notes (Signed)
[facility] PREP Weekly Session  Patient Details  Name: Erin Lawson MRN: [id number] Date of Birth: Apr 20, 1960 Age: 61 y.o. PCP: Leigh Aurora, DO  Vitals:   08/20/21 1300  Weight: 145 lb 3.2 oz (65.9 kg)     [facility] Weekly seesion - 08/21/21 1600       [facility] "PREP" Location   [facility] "PREP" Location [facility]      Weekly Session   Topic Discussed Importance of resistance training;Other ways to be active    Minutes exercised this week 45 minutes    Classes attended to date [place] 08/21/2021, 4:54 PM

## 2021-08-23 ENCOUNTER — Other Ambulatory Visit: Payer: Self-pay

## 2021-08-23 DIAGNOSIS — B0229 Other postherpetic nervous system involvement: Secondary | ICD-10-CM

## 2021-08-23 NOTE — Telephone Encounter (Signed)
lidocaine (LIDODERM) 5 %, REFILL REQUEST @ [id number]- [place], [place] - [address]

## 2021-08-28 NOTE — Progress Notes (Signed)
[facility] PREP Weekly Session  Patient Details  Name: Erin Lawson MRN: [id number] Date of Birth: 12/08/1960 Age: 61 y.o. PCP: Leigh Aurora, DO  There were no vitals filed for this visit.   [facility] Weekly seesion - 08/28/21 1600       [facility] "PREP" Location   [facility] "PREP" Location [facility]      Weekly Session   Topic Discussed Healthy eating tips    Minutes exercised this week 240 minutes    Classes attended to date [place] 08/28/2021, 4:32 PM

## 2021-08-30 MED ORDER — LIDOCAINE 5 % EX PTCH: 1.0000 | MEDICATED_PATCH | Freq: Two times a day (BID) | CUTANEOUS | 0 refills | Status: DC

## 2021-09-25 NOTE — Progress Notes (Signed)
[facility] PREP Weekly Session  Patient Details  Name: Erin Lawson MRN: [id number] Date of Birth: [birth year] Age: 61 y.o. PCP: Leigh Aurora, DO  Vitals:   09/24/21 1300  Weight: 144 lb 12.8 oz (65.7 kg)     [facility] Weekly seesion - 09/25/21 1100       [facility] "PREP" Location   [facility] "PREP" [facility]      Weekly Session   Topic Discussed Restaurant Eating    Minutes exercised this week 280 minutes    Classes attended to date 9           Salt and sugar demo Suggested some higher protein snacks for her. Works night so goes long periods of time without a complete meal. Needs ability to have smaller snack like meals   Pam Tally Joe 09/25/2021, 11:37 AM

## 2021-09-26 ENCOUNTER — Telehealth: Payer: Self-pay | Admitting: Student

## 2021-09-26 NOTE — Telephone Encounter (Signed)
Pt reporting that she is using 2 patches at a time of the following medication.  Pt states she is running out and would like to know if she can get something a longer stronger so that it will last longer.  lidocaine (LIDODERM) 5 %  [id number] [place] [facility] [address]

## 2021-09-27 ENCOUNTER — Other Ambulatory Visit: Payer: Self-pay

## 2021-09-27 DIAGNOSIS — B0229 Other postherpetic nervous system involvement: Secondary | ICD-10-CM

## 2021-09-27 MED ORDER — LIDOCAINE 5 % EX PTCH: 1.0000 | MEDICATED_PATCH | Freq: Two times a day (BID) | CUTANEOUS | 0 refills | Status: AC

## 2021-10-01 NOTE — Telephone Encounter (Signed)
Pt already has an appt 10/23 with Dr Posey Pronto - do we need to re-schedule for an earlier appt? Thanks

## 2021-10-01 NOTE — Telephone Encounter (Signed)
Called pt to schedule an appt per Dr Dema Severin to discuss lidoderm patches, her symptoms and possible a different treatment. Pt stated she has different appts already scheduled. And will keep her appt on 10/23. Informed pt to call the office back if she's able to schedule an appt sooner than 10/23 stated she will.

## 2021-10-03 NOTE — Progress Notes (Signed)
[facility] PREP Weekly Session  Patient Details  Name: Erin Lawson MRN: [id number] Date of Birth: 1960-10-15 Age: 61 y.o. PCP: Leigh Aurora, DO  Vitals:   10/01/21 1311  Weight: 140 lb (63.5 kg)     [facility] Weekly seesion - 10/03/21 1300       [facility] "PREP" Location   [facility] "PREP" Location [facility]      Weekly Session   Topic Discussed Stress management and problem solving   relaxation meditation, stretching   Classes attended to date 74             [place] 10/03/2021, 1:12 PM

## 2021-10-09 NOTE — Progress Notes (Signed)
[facility] PREP Weekly Session  Patient Details  Name: Erin Lawson MRN: [id number] Date of Birth: 04/10/1960 Age: 61 y.o. PCP: Leigh Aurora, DO  Vitals:   10/08/21 1300  Weight: 139 lb 3.2 oz (63.1 kg)     [facility] Weekly seesion - 10/09/21 1200       [facility] "PREP" Location   [facility] "PREP" Location [facility]      Weekly Session   Topic Discussed Expectations and non-scale victories    Minutes exercised this week 60 minutes    Classes attended to date 13             Barnett Hatter 10/09/2021, 12:32 PM

## 2021-10-16 NOTE — Progress Notes (Signed)
[facility] PREP Weekly Session  Patient Details  Name: Erin Lawson MRN: [id number] Date of Birth: 1960-09-18 Age: 61 y.o. PCP: Leigh Aurora, DO  Vitals:   10/15/21 1327  Weight: 139 lb 3.2 oz (63.1 kg)     [facility] Weekly seesion - 10/16/21 1300       [facility] "PREP" Location   [facility] "PREP" Location [facility]      Weekly Session   Topic Discussed Other   portions   Minutes exercised this week 1440 minutes   moved houses   Classes attended to date [place] 10/16/2021, 1:29 PM

## 2021-10-28 NOTE — Progress Notes (Signed)
[facility] PREP Weekly Session  Patient Details  Name: Erin Lawson MRN: [id number] Date of Birth: Jan 11, 1961 Age: 61 y.o. PCP: Leigh Aurora, DO  Vitals:   10/22/21 1300  Weight: 140 lb (63.5 kg)     [facility] Weekly seesion - 10/28/21 1800       [facility] "PREP" Location   [facility] "PREP" Location [facility]      Weekly Session   Topic Discussed Finding support    Minutes exercised this week 360 minutes    Classes attended to date 22             [place] 10/28/2021, 6:07 PM

## 2021-10-31 NOTE — Progress Notes (Signed)
[facility] PREP Weekly Session  Patient Details  Name: Erin Lawson MRN: [id number] Date of Birth: 1960/10/07 Age: 61 y.o. PCP: Leigh Aurora, DO  Vitals:   10/29/21 1300  Weight: 140 lb 6.4 oz (63.7 kg)     [facility] Weekly seesion - 10/31/21 1500       [facility] "PREP" Location   [facility] "PREP" Location [facility]      Weekly Session   Topic Discussed Calorie breakdown    Minutes exercised this week 300 minutes    Classes attended to date 9             [place] 10/31/2021, 3:17 PM

## 2021-11-05 ENCOUNTER — Other Ambulatory Visit: Payer: Self-pay

## 2021-11-05 ENCOUNTER — Ambulatory Visit (INDEPENDENT_AMBULATORY_CARE_PROVIDER_SITE_OTHER): Payer: 59

## 2021-11-05 ENCOUNTER — Encounter: Payer: Self-pay | Admitting: Hematology and Oncology

## 2021-11-05 ENCOUNTER — Ambulatory Visit (HOSPITAL_COMMUNITY)
Admission: RE | Admit: 2021-11-05 | Discharge: 2021-11-05 | Disposition: A | Discharge status: Home / Self Care | Payer: 59 | Source: Ambulatory Visit | Attending: Internal Medicine | Admitting: Internal Medicine

## 2021-11-05 ENCOUNTER — Encounter: Payer: Self-pay | Admitting: Student

## 2021-11-05 VITALS — BP 147/65 | HR 71 | Temp 98.1°F | Ht 61.0 in | Wt 138.3 lb

## 2021-11-05 DIAGNOSIS — I10 Essential (primary) hypertension: Secondary | ICD-10-CM

## 2021-11-05 DIAGNOSIS — Z7984 Long term (current) use of oral hypoglycemic drugs: Secondary | ICD-10-CM

## 2021-11-05 DIAGNOSIS — F4323 Adjustment disorder with mixed anxiety and depressed mood: Secondary | ICD-10-CM | POA: Diagnosis not present

## 2021-11-05 DIAGNOSIS — G8929 Other chronic pain: Secondary | ICD-10-CM | POA: Diagnosis present

## 2021-11-05 DIAGNOSIS — M546 Pain in thoracic spine: Secondary | ICD-10-CM | POA: Insufficient documentation

## 2021-11-05 DIAGNOSIS — E119 Type 2 diabetes mellitus without complications: Secondary | ICD-10-CM | POA: Diagnosis not present

## 2021-11-05 DIAGNOSIS — R079 Chest pain, unspecified: Secondary | ICD-10-CM

## 2021-11-05 DIAGNOSIS — F4321 Adjustment disorder with depressed mood: Secondary | ICD-10-CM | POA: Insufficient documentation

## 2021-11-05 LAB — GLUCOSE, CAPILLARY: Glucose-Capillary: 142 mg/dL — ABNORMAL HIGH (ref 70–99)

## 2021-11-05 LAB — POCT GLYCOSYLATED HEMOGLOBIN (HGB A1C): Hemoglobin A1C: 9 % — AB (ref 4.0–5.6)

## 2021-11-05 NOTE — Assessment & Plan Note (Signed)
The patient is having left sided burning and stabbing pain below her left breast around the level of the 10th-11th rib. She says the pain started in September when she lost her grandson, and at that time she also had a red non-vesicular rash that lasted a week and went away. The pain is non-exertional, comes and goes, has no associated dyspnea/CP/nausea or vomiting. Eating does not make the pain worse. Nothing seems to make it better or worse. It does not seem to come from her back although she does have left thoracic back pain. There is no ptp on exam and no overlying skin changes. The patient does have a history of L breast cancer with radiation in that area. This does not seem like cardiac chest pain. It could be post herpetic neuralgia although the rash does not fit shingles description. This could be post radiation changes from breast cancer also. Additional possibilities are T2DM mononeuropathy  Or radiculopathy in the setting of back pain. Discussed medication options for neuropathic pain and the patient is not interested at this time. -2 view thoracic radiographs, can follow up with MRI if indicated -trial lidocaine patches

## 2021-11-05 NOTE — Addendum Note (Signed)
Addended by: Jodean Lima on: 11/05/2021 01:43 PM   Modules accepted: Level of Service

## 2021-11-05 NOTE — Patient Instructions (Signed)
Thank you, [name] for allowing [name] to provide your care today. Today we discussed :  Grief- We talked today about medications and therapy for your recent loss. You want to try therapy for now and we have sent in a referral for professional counseling.  Pain below the left breast- Since you have back pain this may represent a pinched nerve. We are going to have xrays taken to make sure. If these are normal, this could be a nerve pain caused by your diabetes. We will talk about potential treatment options based on the xray results.   Diabetes- Your A1c today was 9% and our goal for you is less than 7%. You should continue with lifestyle modifications like good diet and exercise. We have 2 medication options, which are increasing metformin or starting a metformin combination pill with a medication class called SGLT2 inhibitors. These protect the heart and kidneys and also treat diabetes. The SGLT2 inhibitors are canaglaflozin, empaglaflozin and dapaglaflozin. Any of these can be combined with metformin.  Hypertension- Please continue recording your blood pressure at home and bring in your log at your next visit. Your blood pressure was high today which may be due to white coat hypertension. It is important to treat high blood pressure to prevent stroke, heart attack, kidney damage. If your blood pressure top number is in the 130s we can continue trying to treat with lifestyle modification. We would also like to check your kidney function today with your diabetes and high blood pressure.  I have ordered the following labs for you:  Lab Orders         Glucose, capillary         BMP8+Anion Gap         POC Hbg A1C      Tests ordered today:  BMP  Referrals ordered today:   Referral Orders         Ambulatory referral to Psychology       I have ordered the following medication/changed the following medications:   Stop the following medications: There are no discontinued  medications.   Start the following medications: No orders of the defined types were placed in this encounter.    Follow up:  1 month      We look forward to seeing you next time. Please call our clinic at [phone] if you have any questions or concerns. The best time to call is Monday-Friday from 9am-4pm, but there is someone available 24/7. If after hours or the weekend, call the main hospital number and ask for the Internal Medicine Resident On-Call. If you need medication refills, please notify your pharmacy one week in advance and they will send [name] a request.   Thank you for trusting me with your care. Wishing you the best!   Iona Coach, MD [place]

## 2021-11-05 NOTE — Assessment & Plan Note (Signed)
BP 147/65 today. Patient says systolics are in the 320E at home. She has been on BP meds in the past and says they were not helpful. The stroke, cardiac, renal risks of uncontrolled HTN were explained to the patient. She does not want to take medications at this time. She will continue lifestyle modifications and will bring a BP log to her next visit.

## 2021-11-05 NOTE — Assessment & Plan Note (Addendum)
A1c today is 9.0. The patient is currently on metformin 1000 daily and higher doses cause GI upset. She wants to continue lifestyle modifications(PREP) program and metformin at this time. SGLT2 inhibitors were discussed for cardiorenal benefit and the patient wants time to research these medications. She has seen Butch Penny in the past for glucose monitor placement which was unsuccessful. Could consider nutrition referral. -BMP -A1c repeat in 3 months -lipid panel in 3 months
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
## 2021-11-05 NOTE — Progress Notes (Signed)
Internal Medicine Clinic Attending  I saw and evaluated the patient.  I personally confirmed the key portions of the history and exam documented by Dr. Rogers and I reviewed pertinent patient test results.  The assessment, diagnosis, and plan were formulated together and I agree with the documentation in the resident's note.  

## 2021-11-05 NOTE — Progress Notes (Signed)
m °

## 2021-11-05 NOTE — Progress Notes (Signed)
[facility] PREP Weekly Session  Patient Details  Name: Erin Lawson MRN: [id number] Date of Birth: 05-30-1960 Age: 61 y.o. PCP: Leigh Aurora, DO  Vitals:   11/05/21 1300  Weight: 138 lb 6.4 oz (62.8 kg)     [facility] Weekly seesion - 11/05/21 1500       [facility] "PREP" Location   [facility] "PREP" Location [facility]      Weekly Session   Topic Discussed Hitting roadblocks    Minutes exercised this week 480 minutes    Classes attended to date 20             Barnett Hatter 11/05/2021, 3:57 PM
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
## 2021-11-06 LAB — BMP8+ANION GAP
Anion Gap: 18 mmol/L (ref 10.0–18.0)
BUN/Creatinine Ratio: 18 (ref 12–28)
BUN: 13 mg/dL (ref 8–27)
CO2: 21 mmol/L (ref 20–29)
Calcium: 9.8 mg/dL (ref 8.7–10.3)
Chloride: 99 mmol/L (ref 96–106)
Creatinine, Ser: 0.73 mg/dL (ref 0.57–1.00)
Glucose: 141 mg/dL — ABNORMAL HIGH (ref 70–99)
Potassium: 4.3 mmol/L (ref 3.5–5.2)
Sodium: 138 mmol/L (ref 134–144)
eGFR: 93 mL/min/{1.73_m2} (ref >59)

## 2021-11-07 ENCOUNTER — Telehealth: Payer: Self-pay

## 2021-11-07 NOTE — Progress Notes (Signed)
Attempted to call the patient twice and did not get a response. Her BMP came back normal with no evidence of renal abnormalities or electrolyte abnormalities. Her thoracic radiographs did not show evidence of degenerative disc disease, foramen narrowing or radiculopathy. This may indicate more of a mononeuropathy from diabetes or post radiation. Will continue to monitor for improvement with lidocaine patches, can discuss neuropathic pain meds at next visit if not resolved.

## 2021-11-07 NOTE — Telephone Encounter (Signed)
Requesting test results, please call pt back.  

## 2021-11-13 NOTE — Progress Notes (Signed)
[facility] PREP Evaluation  Patient Details  Name: Erin Lawson MRN: [id number] Date of Birth: February 02, 1960 Age: 61 y.o. PCP: Leigh Aurora, DO  Vitals:   11/12/21 1300  BP: (!) 157/86  Pulse: 78  SpO2: 98%  Weight: 138 lb (62.6 kg)     [facility] Eval - 11/13/21 1100       [facility] "PREP" Location   [facility] "PREP" Location [facility]      Referral    Program Start Date --   Final date of class 11/12/21     Measurement   Waist Circumference 36 inches    Hip Circumference 37.5 inches    Body fat 36.9 percent      Information for Trainer   Goals reset, enc to continue to exercise to get HGB A1C below 7      Mobility and Daily Activities   I find it easy to walk up or down two or more flights of stairs. 4    I have no trouble taking out the trash. 4    I do housework such as vacuuming and dusting on my own without difficulty. 4    I can easily lift a gallon of milk (8lbs). 4    I can easily walk a mile. 4    I have no trouble reaching into high cupboards or reaching down to pick up something from the floor. 4    I do not have trouble doing out-door work such as Armed forces logistics/support/administrative officer, raking leaves, or gardening. 4      Mobility and Daily Activities   I feel younger than my age. 4    I feel independent. 4    I feel energetic. 4    I live an active life.  4    I feel strong. 4    I feel healthy. 4    I feel active as other people my age. 4      How fit and strong are you.   Fit and Strong Total Score 56            Past Medical History:  Diagnosis Date   Anemia    Anxiety    At increased risk for cardiovascular disease 08/29/2020   Breast cancer ([place])    Cancer ([place]) 10/2018   left breast IDC   Chest pain 05/18/2012   DM type 2 (diabetes mellitus, type 2) ([place]) 05/18/2012   GERD (gastroesophageal reflux disease)    OTC prn   Hypertension    Pain in paraspinal region 03/30/2021   Personal history of chemotherapy    Personal history of radiation therapy    Past  Surgical History:  Procedure Laterality Date   BREAST LUMPECTOMY WITH RADIOACTIVE SEED AND SENTINEL LYMPH NODE BIOPSY Left 04/21/2019   Procedure: LEFT BREAST LUMPECTOMY WITH RADIOACTIVE SEED AND SENTINEL LYMPH NODE MAPPING;  Surgeon: Erroll Luna, MD;  Location: [facility];  Service: General;  Laterality: Left;   CESAREAN SECTION     PORTACATH PLACEMENT Right 11/11/2018   Procedure: INSERTION PORT-A-CATH WITH ULTRASOUND;  Surgeon: Erroll Luna, MD;  Location: [facility];  Service: General;  Laterality: Right;   utreine polyp     resected   Social History   Tobacco Use  Smoking Status Never  Smokeless Tobacco Never  Attended 21 workouts, 10 of 12 educational sessions Cardio march test: 239 to 300 Sit to stand: 10 to 17 Bicep curl: 17 to 22 Balance remains good Hesitant to start  BP meds, stressed exercise and diet importance in maintaining health.  Reluctant to see someone about grief. Encouraged to pt to find someone she can feel good about talking to about loss of grandson. Brainstormed some ideas so she can process the grief instead of bottling it in.    Barnett Hatter 11/13/2021, 11:44 AM

## 2021-12-04 ENCOUNTER — Ambulatory Visit: Payer: 59 | Admitting: Psychology

## 2021-12-10 ENCOUNTER — Encounter: Payer: 59 | Admitting: Student

## 2022-01-09 ENCOUNTER — Other Ambulatory Visit: Payer: Self-pay

## 2022-01-09 DIAGNOSIS — E119 Type 2 diabetes mellitus without complications: Secondary | ICD-10-CM

## 2022-01-09 NOTE — Telephone Encounter (Signed)
Received Refill request for metformin '500mg'$  xr take 2 tabs daily with breakfast Pt prefers the '1000mg'$  tabs taking once daily  as she did not want to take medication twice daily.  CMA explained to patient '1000mg'$  is not extended release and no longer on her medication profile Pt agreeable to metformin '500mg'$  24hr tabs take 2 tabs daily with breakfast-which is reflected in chart. Pt already has refills at the pharmacy.  Pt will call back if she has any GI problems

## 2022-04-01 ENCOUNTER — Encounter: Payer: Self-pay | Admitting: Hematology and Oncology

## 2022-04-01 ENCOUNTER — Encounter: Payer: Self-pay | Admitting: Internal Medicine

## 2022-04-01 ENCOUNTER — Ambulatory Visit (INDEPENDENT_AMBULATORY_CARE_PROVIDER_SITE_OTHER): Payer: 59 | Admitting: Internal Medicine

## 2022-04-01 ENCOUNTER — Ambulatory Visit (HOSPITAL_COMMUNITY)
Admission: RE | Admit: 2022-04-01 | Discharge: 2022-04-01 | Disposition: A | Discharge status: Home / Self Care | Payer: 59 | Source: Ambulatory Visit | Attending: Internal Medicine | Admitting: Internal Medicine

## 2022-04-01 VITALS — BP 168/85 | HR 66 | Ht 61.0 in | Wt 144.8 lb

## 2022-04-01 DIAGNOSIS — M25561 Pain in right knee: Secondary | ICD-10-CM | POA: Diagnosis not present

## 2022-04-01 NOTE — Progress Notes (Signed)
CC: R knee pain and swelling  HPI:  Erin Lawson is a 62 y.o. female living with a history stated below and presents today for an acute visit regarding leg pain and swelling. Please see problem based assessment and plan for additional details.  Past Medical History:  Diagnosis Date   Anemia    Anxiety    At increased risk for cardiovascular disease 08/29/2020   Breast cancer ([place])    Cancer ([place]) 10/2018   left breast IDC   Chest pain 05/18/2012   DM type 2 (diabetes mellitus, type 2) ([place]) 05/18/2012   GERD (gastroesophageal reflux disease)    OTC prn   Hypertension    Pain in paraspinal region 03/30/2021   Personal history of chemotherapy    Personal history of radiation therapy     Current Outpatient Medications on File Prior to Visit  Medication Sig Dispense Refill   Accu-Chek Softclix Lancets lancets Check blood sugar 3 times a day 100 each 11   blood glucose meter kit and supplies Dispense based on patient and insurance preference. Use up to four times daily as directed. (FOR ICD-10 E10.9, E11.9). 1 each 0   clindamycin (CLEOCIN) 300 MG capsule Take 1 capsule (300 mg total) by mouth 3 (three) times daily. 24 capsule 0   Continuous Blood Gluc Sensor (FREESTYLE LIBRE 2 SENSOR) MISC Use to check blood sugar at least 6 times a day before and 2 hours after meals 2 each 11   diclofenac Sodium (VOLTAREN) 1 % GEL Apply 4 g topically 2 (two) times daily as needed. 4 g 0   Ergocalciferol 10 MCG (400 UNIT) TABS Take 2 tablets by mouth daily. 60 tablet 2   fluticasone (FLONASE) 50 MCG/ACT nasal spray Place 2 sprays into both nostrils daily. 9.9 mL 0   glucose blood (ACCU-CHEK GUIDE) test strip Check blood sugar 3 times per day 100 each 11   latanoprost (XALATAN) 0.005 % ophthalmic solution Place 1 drop into both eyes daily.     lidocaine (LIDODERM) 5 % Place 1 patch onto the skin every 12 (twelve) hours. Remove & Discard patch within 12 hours or as directed by MD 10 patch 0    loratadine (CLARITIN) 10 MG tablet Take 1 tablet (10 mg total) by mouth daily. 30 tablet 0   magic mouthwash (lidocaine, diphenhydrAMINE, alum & mag hydroxide) suspension Swish and spit 5 mLs 3 (three) times daily as needed for mouth pain. 360 mL 0   metFORMIN (GLUCOPHAGE-XR) 500 MG 24 hr tablet Take 2 tablets (1,000 mg total) by mouth daily with breakfast. 180 tablet 3   methylPREDNISolone (MEDROL DOSEPAK) 4 MG TBPK tablet Day 1: 8 mg PO before breakfast, 4 mg after lunch, 4 mg after dinner, and 8 mg at bedtime Day 2: 4 mg PO before breakfast, 4 mg after lunch, 4 mg after dinner, and 8 mg at bedtime Day 3: 4 mg PO before breakfast, 4 mg after lunch, 4 mg after dinner, and 4 mg at bedtime Day 4: 4 mg PO before breakfast, 4 mg after lunch, and 4 mg at bedtime Day 5: 4 mg PO before breakfast, and 4 mg at bedtime Day 6: 4 mg PO before breakfast 21 tablet 0   [DISCONTINUED] prochlorperazine (COMPAZINE) 10 MG tablet Take 1 tablet (10 mg total) by mouth every 6 (six) hours as needed (Nausea or vomiting). (Patient not taking: Reported on 03/24/2019) 30 tablet 1   [DISCONTINUED] sitaGLIPtin-metformin (JANUMET) 50-1000 MG tablet Take 1 tablet by  mouth 2 (two) times daily with a meal. 60 tablet 3   No current facility-administered medications on file prior to visit.    Family History  Problem Relation Age of Onset   Hypertension Sister    Heart attack Neg Hx     Social History   Socioeconomic History   Marital status: Single    Spouse name: Not on file   Number of children: Not on file   Years of education: Not on file   Highest education level: Not on file  Occupational History   Occupation: cna    Comment: [employer]   Occupation: Corporate treasurer  Tobacco Use   Smoking status: Never   Smokeless tobacco: Never  Vaping Use   Vaping Use: Not on file  Substance and Sexual Activity   Alcohol use: No   Drug use: No   Sexual activity: Not Currently    Birth  control/protection: Post-menopausal  Other Topics Concern   Not on file  Social History Narrative   Marital status: single; not dating      Children: twins in 35; 3 grandchildren/boys      Lives: with friend, son      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  Vitals:   04/01/22 1014  BP: (!) 168/85  Pulse: 66  SpO2: 99%  Weight: 144 lb 12.8 oz (65.7 kg)  Height: 5\' 1"  (1.549 m)    Physical Exam: Constitutional: well-appearing, in no acute distress, but appears uncomfortable Cardiovascular: regular rate and rhythm, no m/r/g MSK: normal bulk and tone; some edema around lateral aspect of R knee but no palpable effusion; normal varus and valgus stress testing, negative anterior drawer test; limited flexion and extension of R knee Neurological: alert & oriented x 3, no focal deficit Skin: warm and dry Psych: normal mood and behavior  Assessment & Plan:    Patient discussed with Dr. Cain Sieve  Acute pain of right knee Ms. Russo presents to the [facility] for an acute visit regarding right knee pain (localized to lateral knee).  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  Last week while at work and making her rounds, she was going down the stairs and noticed that she had heard a pop in her right knee.  A couple of hours later, she realized that her knee had become swollen and was painful now.  She denies any recent injuries or falls, but notes that ever since she heard that  pop she has had constant pain and intermittent swelling in that right knee.  She is not really able to bear weight on that right knee without the assistance of a cane, and she does not normally ambulate with a cane at baseline.  She also states that yesterday her right knee was much more swollen than it is  today, but she took ibuprofen 400 mg x 1 dose and that seemed to help.  On exam, the patient has normal varus valgus stress testing and a negative anterior drawer test.  She does have limited right knee extension and flexion (range of motion is limited by pain).  Plan: -Counseled on [place] to take scheduled ibuprofen 400 mg every 6 hours this week -Right knee x-ray today - If pain/swelling or not improved in about 4 weeks, likely will need to obtain MRI (given that the patient's pain is acute and localized to her bilateral knee, she may have a degenerative lateral meniscal tear)   Adreana Coull, D.O. [facility], PGY-2 Phone: [phone] Date 04/01/2022 Time 11:34 AM

## 2022-04-01 NOTE — Patient Instructions (Signed)
Thank you, Ms.Areliz Millard Colbaugh for allowing [name] to provide your care today. Today we discussed:  Knee pain:  Please take ibuprofen 400 mg every 6 hours for about one week, this will help your swelling and pain We are getting an xray of your knee today too If the pain/swelling is not improved in ~4 weeks, we will likely need to get an MRI I have given you a work excuse  I have ordered the following labs for you:  Lab Orders  No laboratory test(s) ordered today     Tests ordered today:  Right knee xray  Referrals ordered today:   Referral Orders  No referral(s) requested today     I have ordered the following medication/changed the following medications:   Stop the following medications: There are no discontinued medications.   Start the following medications: No orders of the defined types were placed in this encounter.    Follow up:  4 weeks     Should you have any questions or concerns please call the internal medicine clinic at [phone].     Buddy Duty, D.O. [place]

## 2022-04-01 NOTE — Assessment & Plan Note (Signed)
Erin Lawson presents to the [facility] for an acute visit regarding right knee pain (localized to lateral knee).  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  Last week while at work and making her rounds, she was going down the stairs and noticed that she had heard a pop in her right knee.  A couple of hours later, she realized that her knee had become swollen and was painful now.  She denies any recent injuries or falls, but notes that ever since she heard that pop she has had constant pain and intermittent swelling in that right knee.  She is not really able to bear weight on that right knee without the assistance of a cane, and she does not normally ambulate with a cane at baseline.  She also states that yesterday her right knee was much more swollen than it is today, but she took ibuprofen 400 mg x 1 dose and that seemed to help.  On exam, the patient has normal varus valgus stress testing and a negative anterior drawer test.  She does have limited right knee extension and flexion (range of motion is limited by pain).  Plan: -Counseled on Tolstoy to take scheduled ibuprofen 400 mg every 6 hours this week -Right knee x-ray today - If pain/swelling or not improved in about 4 weeks, likely will need to obtain MRI (given that the patient's pain is acute and localized to her bilateral knee, she may have a degenerative lateral meniscal tear)

## 2022-04-02 NOTE — Progress Notes (Signed)
Right knee x-ray unremarkable. Patient notes that her ROM is improved slightly, however, knee is still painful and swollen.  Advised her to continue with RICE + ibuprofen q6h. If pain/swelling has not improved in a few weeks, may need to get an MRI for further evaluation.

## 2022-04-03 NOTE — Progress Notes (Signed)
Internal Medicine Clinic Attending ° °Case discussed with Dr. Atway  At the time of the visit.  We reviewed the resident’s history and exam and pertinent patient test results.  I agree with the assessment, diagnosis, and plan of care documented in the resident’s note.  °

## 2022-04-04 ENCOUNTER — Telehealth: Payer: Self-pay | Admitting: Internal Medicine

## 2022-04-04 NOTE — Telephone Encounter (Signed)
Rec'd call from the pt this morning stating she does not won't to wait 4 weeks to get an MRI.  Pt state's she is having a lot of pain and wants to go ahead and get some imaging done now.  Explained and made the patient aware that an MRI Order has not been placed yet and, we would have to speak with the Physician first.  Will the patient need to be seen again before her MRI can be ordered?

## 2022-04-26 ENCOUNTER — Ambulatory Visit (INDEPENDENT_AMBULATORY_CARE_PROVIDER_SITE_OTHER): Payer: 59

## 2022-04-26 VITALS — BP 156/74 | HR 75 | Temp 98.1°F | Ht 61.0 in | Wt 148.5 lb

## 2022-04-26 DIAGNOSIS — I10 Essential (primary) hypertension: Secondary | ICD-10-CM

## 2022-04-26 DIAGNOSIS — G589 Mononeuropathy, unspecified: Secondary | ICD-10-CM

## 2022-04-26 DIAGNOSIS — G8929 Other chronic pain: Secondary | ICD-10-CM | POA: Insufficient documentation

## 2022-04-26 DIAGNOSIS — M25561 Pain in right knee: Secondary | ICD-10-CM | POA: Diagnosis not present

## 2022-04-26 DIAGNOSIS — E119 Type 2 diabetes mellitus without complications: Secondary | ICD-10-CM

## 2022-04-26 LAB — GLUCOSE, CAPILLARY: Glucose-Capillary: 280 mg/dL — ABNORMAL HIGH (ref 70–99)

## 2022-04-26 LAB — POCT GLYCOSYLATED HEMOGLOBIN (HGB A1C): Hemoglobin A1C: 9.7 % — AB (ref 4.0–5.6)

## 2022-04-26 NOTE — Assessment & Plan Note (Signed)
Patient presents for follow-up of her hypertension.  Blood pressure 147/65 at last follow-up.  She says that her systolics at home are typically in the 130s.  Today her blood pressure is 156/74.  I discussed the importance of treating her blood pressure with her but she is still not interested in beginning medication.  She reports that she would rather have her knee evaluated first. -Encourage patient to schedule follow-up as soon as she is able to discuss blood pressure medication

## 2022-04-26 NOTE — Progress Notes (Signed)
CC: DMII follow up  HPI:  Erin Lawson is a 62 y.o. with past medical history as below who presents for DMII follow up.  Past Medical History:  Diagnosis Date   Anemia    Anxiety    At increased risk for cardiovascular disease 08/29/2020   Breast cancer (HCC)    Cancer (HCC) 10/2018   left breast IDC   Chest pain 05/18/2012   DM type 2 (diabetes mellitus, type 2) (HCC) 05/18/2012   GERD (gastroesophageal reflux disease)    OTC prn   Hypertension    Pain in paraspinal region 03/30/2021   Personal history of chemotherapy    Personal history of radiation therapy    Review of Systems:  See detailed assessment and plan for pertinent ROS.  Physical Exam:  Vitals:   04/26/22 0902  BP: (!) 156/74  Pulse: 75  Temp: 98.1 F (36.7 C)  TempSrc: Oral  SpO2: 99%  Weight: 148 lb 8 oz (67.4 kg)  Height: 5\' 1"  (1.549 m)   Physical Exam Constitutional:      General: She is not in acute distress. HENT:     Head: Normocephalic and atraumatic.  Eyes:     Extraocular Movements: Extraocular movements intact.  Cardiovascular:     Rate and Rhythm: Normal rate and regular rhythm.     Heart sounds: No murmur heard. Pulmonary:     Effort: Pulmonary effort is normal.     Breath sounds: No wheezing, rhonchi or rales.  Musculoskeletal:     Cervical back: Neck supple.     Right lower leg: No edema.     Left lower leg: No edema.     Comments: Strength and sensation intact at left wrist.  No swelling or bony abnormality.  Full range of motion.  Right knee with point tenderness just lateral to patella.  Negative provocative ligamentous, meniscal testing.  No joint laxity.  No effusion.  No bony tenderness.  Strength intact.  No warmth, erythema, or other skin changes.  Skin:    General: Skin is warm and dry.  Neurological:     Mental Status: She is alert and oriented to person, place, and time.     Comments: Negative Phalen's and Tinel's at left wrist.  Psychiatric:        Mood  and Affect: Mood normal.        Behavior: Behavior normal.      Assessment & Plan:   See Encounters Tab for problem based charting.  Essential hypertension Patient presents for follow-up of her hypertension.  Blood pressure 147/65 at last follow-up.  She says that her systolics at home are typically in the 130s.  Today her blood pressure is 156/74.  I discussed the importance of treating her blood pressure with her but she is still not interested in beginning medication.  She reports that she would rather have her knee evaluated first. -Encourage patient to schedule follow-up as soon as she is able to discuss blood pressure medication  DM type 2 (diabetes mellitus, type 2) Patient presents for follow-up of her diabetes.  Last A1c 9.0.  A1c today 9.7.  She is currently taking anywhere between 501,000 mg daily depending on whether or not she is working.  She states that the higher doses of metformin causes GI upset.  She denies any symptoms of hypoglycemia.  She reports that her sugars at home typically run in the low 200s.  I discussed with her that an additional medication such as  an SGLT2i would be beneficial for her and preventing complications of diabetes.  S -Continue metformin as tolerated -Encourage patient to schedule appointment as soon as she is ready to discuss optimizing her diabetes regimen  Acute pain of right knee Patient presents with continued right knee pain.  She states that her symptoms have improved a little bit but this is due to her taking Aleve and brace.  She reports that over a month ago she began to notice pain in her right knee that occasionally radiates up her thigh and down her shin.  Sometimes she feels popping and can sometimes feel as though her leg is dragging.  Denies her knee locking up or buckling.  Denies any trauma or falls.  She states pain is worst with walking.  States her knee is typically pretty swollen but not so much today.  Knee exam today largely  unremarkable only with some tenderness just lateral to the patella.  X-ray at last visit unremarkable.  Unsure of the etiology of her pain.  Could be ligamentous, meniscal, bursal, or subtle abnormality not picked up on x-ray. -Referral to orthopedics -Continue OTC analgesic as needed -Continue brace as needed -Once again counseled on RICE  Mononeuropathy Patient presents with several weeks of numbness, tingling, pain at her left thumb.  She denies any propagating factors.  Exam does not reveal any obvious abnormalities.  Carpal tunnel testing negative.  Carpal grind tests negative.  Finkelstein test negative.  Patient does have a history of radiation to the left axilla, monoclonal antibody therapy, and diabetes uncontrolled.  Recent thoracic spine x-ray negative for obvious cause though this still could be a radiculopathy.  Unsure of the cause of her neuropathy.  She states that she is not ready to discuss medical therapy today. -Consider medical therapy in the future such as gabapentin     Patient discussed with Dr. Oswaldo Done

## 2022-04-26 NOTE — Assessment & Plan Note (Addendum)
Patient presents with several weeks of numbness, tingling, pain at her left thumb.  She denies any propagating factors.  Exam does not reveal any obvious abnormalities.  Carpal tunnel testing negative.  Carpal grind tests negative.  Finkelstein test negative.  Patient does have a history of radiation to the left axilla, monoclonal antibody therapy, and diabetes uncontrolled.  Recent thoracic spine x-ray negative for obvious cause though this still could be a radiculopathy.  Unsure of the cause of her neuropathy.  She states that she is not ready to discuss medical therapy today. -Consider medical therapy in the future such as gabapentin

## 2022-04-26 NOTE — Patient Instructions (Signed)
[name], it was a pleasure seeing you today!  Today we discussed: Right knee pain - will refer to orthopedic surgery. They should call you to schedule an appointment.   Please schedule appointment at your convenience to discuss diabetes and high blood pressure.  I have ordered the following labs today:   Lab Orders         Glucose, capillary         POC Hbg A1C      Tests ordered today:  none  Referrals ordered today:    Referral Orders         Ambulatory referral to Orthopedic Surgery      I have ordered the following medication/changed the following medications:   Stop the following medications: There are no discontinued medications.   Start the following medications: No orders of the defined types were placed in this encounter.    Follow-up:  prn    Please make sure to arrive 15 minutes prior to your next appointment. If you arrive late, you may be asked to reschedule.   We look forward to seeing you next time. Please call our clinic at [phone] if you have any questions or concerns. The best time to call is Monday-Friday from 9am-4pm, but there is someone available 24/7. If after hours or the weekend, call the main hospital number and ask for the Internal Medicine Resident On-Call. If you need medication refills, please notify your pharmacy one week in advance and they will send [name] a request.  Thank you for letting us take part in your care. Wishing you the best!  Thank you, Erin Bene, MD

## 2022-04-26 NOTE — Assessment & Plan Note (Signed)
Patient presents with continued right knee pain.  She states that her symptoms have improved a little bit but this is due to her taking Aleve and brace.  She reports that over a month ago she began to notice pain in her right knee that occasionally radiates up her thigh and down her shin.  Sometimes she feels popping and can sometimes feel as though her leg is dragging.  Denies her knee locking up or buckling.  Denies any trauma or falls.  She states pain is worst with walking.  States her knee is typically pretty swollen but not so much today.  Knee exam today largely unremarkable only with some tenderness just lateral to the patella.  X-ray at last visit unremarkable.  Unsure of the etiology of her pain.  Could be ligamentous, meniscal, bursal, or subtle abnormality not picked up on x-ray. -Referral to orthopedics -Continue OTC analgesic as needed -Continue brace as needed -Once again counseled on RICE

## 2022-04-26 NOTE — Assessment & Plan Note (Signed)
Patient presents for follow-up of her diabetes.  Last A1c 9.0.  A1c today 9.7.  She is currently taking anywhere between 501,000 mg daily depending on whether or not she is working.  She states that the higher doses of metformin causes GI upset.  She denies any symptoms of hypoglycemia.  She reports that her sugars at home typically run in the low 200s.  I discussed with her that an additional medication such as an SGLT2i would be beneficial for her and preventing complications of diabetes.  S -Continue metformin as tolerated -Encourage patient to schedule appointment as soon as she is ready to discuss optimizing her diabetes regimen

## 2022-04-29 NOTE — Progress Notes (Signed)
Internal Medicine Clinic Attending  Case discussed with Dr. White  At the time of the visit.  We reviewed the resident's history and exam and pertinent patient test results.  I agree with the assessment, diagnosis, and plan of care documented in the resident's note.  

## 2022-05-21 ENCOUNTER — Telehealth: Payer: Self-pay | Admitting: Hematology and Oncology

## 2022-05-23 ENCOUNTER — Inpatient Hospital Stay: Payer: Self-pay | Admitting: Hematology and Oncology

## 2022-06-04 ENCOUNTER — Inpatient Hospital Stay: Payer: 59 | Attending: Hematology and Oncology | Admitting: Hematology and Oncology

## 2022-06-04 ENCOUNTER — Other Ambulatory Visit: Payer: Self-pay

## 2022-06-04 VITALS — BP 166/67 | HR 76 | Temp 97.9°F | Resp 16 | Wt 144.1 lb

## 2022-06-04 DIAGNOSIS — Z853 Personal history of malignant neoplasm of breast: Secondary | ICD-10-CM | POA: Diagnosis present

## 2022-06-04 DIAGNOSIS — Z171 Estrogen receptor negative status [ER-]: Secondary | ICD-10-CM

## 2022-06-04 DIAGNOSIS — C50212 Malignant neoplasm of upper-inner quadrant of left female breast: Secondary | ICD-10-CM | POA: Diagnosis not present

## 2022-06-04 DIAGNOSIS — Z08 Encounter for follow-up examination after completed treatment for malignant neoplasm: Secondary | ICD-10-CM | POA: Diagnosis present
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
## 2022-06-05 ENCOUNTER — Telehealth: Payer: Self-pay | Admitting: Hematology and Oncology

## 2022-06-05 NOTE — Telephone Encounter (Signed)
Scheduled appointment per 5/21 los. Patient is aware of the made appointment.

## 2022-07-11 ENCOUNTER — Ambulatory Visit
Admission: RE | Admit: 2022-07-11 | Discharge: 2022-07-11 | Disposition: A | Discharge status: Home / Self Care | Payer: 59 | Source: Ambulatory Visit | Attending: Hematology and Oncology | Admitting: Hematology and Oncology

## 2022-07-11 DIAGNOSIS — Z171 Estrogen receptor negative status [ER-]: Secondary | ICD-10-CM

## 2022-07-15 ENCOUNTER — Other Ambulatory Visit: Payer: Self-pay | Admitting: *Deleted

## 2022-07-15 DIAGNOSIS — Z171 Estrogen receptor negative status [ER-]: Secondary | ICD-10-CM

## 2022-07-25 ENCOUNTER — Other Ambulatory Visit: Payer: Self-pay

## 2022-07-25 DIAGNOSIS — E119 Type 2 diabetes mellitus without complications: Secondary | ICD-10-CM

## 2022-07-25 MED ORDER — METFORMIN HCL ER 500 MG PO TB24
1000.0000 mg | ORAL_TABLET | Freq: Every day | ORAL | 3 refills | Status: DC
Start: 2022-07-25 — End: 2023-03-14

## 2022-07-30 ENCOUNTER — Ambulatory Visit
Admission: RE | Admit: 2022-07-30 | Discharge: 2022-07-30 | Disposition: A | Discharge status: Home / Self Care | Payer: 59 | Source: Ambulatory Visit | Attending: Hematology and Oncology | Admitting: Hematology and Oncology

## 2022-07-30 DIAGNOSIS — Z171 Estrogen receptor negative status [ER-]: Secondary | ICD-10-CM

## 2022-08-13 ENCOUNTER — Other Ambulatory Visit: Payer: 59

## 2022-09-09 ENCOUNTER — Other Ambulatory Visit: Payer: 59

## 2022-09-09 ENCOUNTER — Ambulatory Visit
Admission: RE | Admit: 2022-09-09 | Discharge: 2022-09-09 | Disposition: A | Discharge status: Home / Self Care | Payer: 59 | Source: Ambulatory Visit | Attending: Hematology and Oncology | Admitting: Hematology and Oncology

## 2022-09-09 MED ORDER — GADOPICLENOL 0.5 MMOL/ML IV SOLN
7.5000 mL | Freq: Once | INTRAVENOUS | Status: AC | PRN
  Administered 2022-09-09: 7.5 mL via INTRAVENOUS

## 2022-09-10 ENCOUNTER — Telehealth: Payer: Self-pay | Admitting: Hematology and Oncology

## 2022-09-10 NOTE — Telephone Encounter (Signed)
I informed the patient of the breast MRI was normal

## 2022-10-10 ENCOUNTER — Encounter: Payer: 59 | Admitting: Student

## 2022-11-20 ENCOUNTER — Emergency Department (HOSPITAL_COMMUNITY): Payer: 59

## 2022-11-20 ENCOUNTER — Emergency Department (HOSPITAL_COMMUNITY)
Admission: EM | Admit: 2022-11-20 | Discharge: 2022-11-20 | Disposition: A | Discharge status: Home / Self Care | Payer: 59 | Attending: Student | Admitting: Student

## 2022-11-20 ENCOUNTER — Other Ambulatory Visit: Payer: Self-pay

## 2022-11-20 ENCOUNTER — Encounter (HOSPITAL_COMMUNITY): Payer: Self-pay

## 2022-11-20 DIAGNOSIS — Z853 Personal history of malignant neoplasm of breast: Secondary | ICD-10-CM | POA: Insufficient documentation

## 2022-11-20 DIAGNOSIS — R0789 Other chest pain: Secondary | ICD-10-CM | POA: Insufficient documentation

## 2022-11-20 DIAGNOSIS — Z7984 Long term (current) use of oral hypoglycemic drugs: Secondary | ICD-10-CM | POA: Diagnosis not present

## 2022-11-20 DIAGNOSIS — Z79899 Other long term (current) drug therapy: Secondary | ICD-10-CM | POA: Insufficient documentation

## 2022-11-20 DIAGNOSIS — R519 Headache, unspecified: Secondary | ICD-10-CM | POA: Insufficient documentation

## 2022-11-20 DIAGNOSIS — E119 Type 2 diabetes mellitus without complications: Secondary | ICD-10-CM | POA: Diagnosis not present

## 2022-11-20 DIAGNOSIS — I10 Essential (primary) hypertension: Secondary | ICD-10-CM | POA: Insufficient documentation

## 2022-11-20 DIAGNOSIS — Y92481 Parking lot as the place of occurrence of the external cause: Secondary | ICD-10-CM | POA: Diagnosis not present

## 2022-11-20 MED ORDER — DIPHENHYDRAMINE HCL 50 MG/ML IJ SOLN
12.5000 mg | Freq: Once | INTRAMUSCULAR | Status: AC
  Administered 2022-11-20: 12.5 mg via INTRAMUSCULAR
  Filled 2022-11-20: qty 1

## 2022-11-20 MED ORDER — METHOCARBAMOL 500 MG PO TABS: 500.0000 mg | ORAL_TABLET | Freq: Two times a day (BID) | ORAL | 0 refills | Status: DC

## 2022-11-20 MED ORDER — PROCHLORPERAZINE EDISYLATE 10 MG/2ML IJ SOLN
10.0000 mg | Freq: Once | INTRAMUSCULAR | Status: AC
  Administered 2022-11-20: 10 mg via INTRAMUSCULAR
  Filled 2022-11-20: qty 2

## 2022-11-20 MED ORDER — ACETAMINOPHEN ER 650 MG PO TBCR
650.0000 mg | EXTENDED_RELEASE_TABLET | Freq: Three times a day (TID) | ORAL | 0 refills | Status: AC | PRN
Start: 2022-11-20 — End: ?
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
## 2022-11-20 NOTE — ED Notes (Signed)
ED Provider at bedside. 
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
## 2023-02-28 ENCOUNTER — Encounter: Payer: Self-pay | Admitting: Student

## 2023-02-28 ENCOUNTER — Ambulatory Visit (INDEPENDENT_AMBULATORY_CARE_PROVIDER_SITE_OTHER): Payer: 59 | Admitting: Student

## 2023-02-28 VITALS — BP 170/67 | HR 60 | Ht 61.0 in | Wt 139.6 lb

## 2023-02-28 DIAGNOSIS — G589 Mononeuropathy, unspecified: Secondary | ICD-10-CM | POA: Diagnosis not present

## 2023-02-28 MED ORDER — CETIRIZINE HCL 10 MG PO TABS: 10.0000 mg | ORAL_TABLET | Freq: Every day | ORAL | 0 refills | Status: DC

## 2023-02-28 MED ORDER — DICLOFENAC SODIUM 1 % EX GEL
4.0000 g | Freq: Two times a day (BID) | CUTANEOUS | 0 refills | Status: AC | PRN
Start: 2023-02-28 — End: ?

## 2023-02-28 NOTE — Progress Notes (Signed)
    SUBJECTIVE:   CHIEF COMPLAINT / HPI:   Patient is a 63 year old female presenting today to establish care. Last saw a doctor in 2024  Past medical history significant for: T2DM, HTN, mononeuropathy, keloid, left breast cancer in 2020 treated with chemotherapy, radiation and lumpectomy,  Past surgical history is significant for: Left breast lumpectomy, C-section Tobacco use: Never  Alcohol: No  Illicit drug use:  Never  Lives with: Son and a friend in an apartment  Works as: Associate Professor concerns today include:   Back pain Started a month ago Described as pins and needles all over the lower back Ocassionally feels like a burning sentation Also subsequently developed severe itchiness with the  [employer] supplement, initially provide relieve but not any more. Now radiate to up back No LE weakness and sensation intact No pelvic numbness, bowel or urinary accidents reported  PERTINENT  PMH / PSH: Reviewed   OBJECTIVE:   BP (!) 170/67   Pulse 60   Ht 5\' 1"  (1.549 m)   Wt 139 lb 9.6 oz (63.3 kg)   SpO2 99%   BMI 26.38 kg/m     General: Alert, well appearing, NAD Cardiovascular: RRR, No Murmurs, Normal S2/S2 Respiratory: CTAB, No wheezing or Rales Abdomen: No distension or tenderness Extremities: Normal strength with sensations in all extremities. Skin: Warm and dry MSK: multiple area of point tenderness in the upper and lower back  ASSESSMENT/PLAN:   Mononeuropathy History of mononeuropathy of unknown etiology.  Unlear cause of patient's symptoms at this time including paresthesia of the back with burning sensations.  Although has poorly controlled diabetes, unusual  presentation for diabetic neuropathy.  Also considered fibromyalgia given point tenderness on palpation and exam. Of note patient does have history of chemotherapy for treatment left breast cancer and given her unusual presentation will place referral to neurologist for further work up  and management. Rx Zyrtec for generalized pruritus.    Establish care 63 year old presenting today to establish care. Last saw a care provider about a year ago. Appear to be noncompliant with her diabetic medication as she expressed desire to go natural dietary modifications and herbal supplements. Will follow-up in 2 weeks for annual wellness visit.   Jerre Simon, MD [facility]

## 2023-02-28 NOTE — Patient Instructions (Addendum)
Welcome to [facility]!  Pleasure to meet you.  I suspect your back pain and burning sensation is most likely neuropathy.  This could be related to your chemotherapy you have had recently.  I am sending in referral to see a neurology specialist to further assess your back pain.  Make sure to return in 1-2 weeks for annual well visit

## 2023-02-28 NOTE — Assessment & Plan Note (Addendum)
History of mononeuropathy of unknown etiology.  Unlear cause of patient's symptoms at this time including paresthesia of the back with burning sensations.  Although has poorly controlled diabetes, unusual  presentation for diabetic neuropathy.  Also considered fibromyalgia given point tenderness on palpation and exam. Of note patient does have history of chemotherapy for treatment left breast cancer and given her unusual presentation will place referral to neurologist for further work up and management. Rx Zyrtec for generalized pruritus.

## 2023-03-11 ENCOUNTER — Encounter: Payer: Self-pay | Admitting: Neurology

## 2023-03-11 ENCOUNTER — Telehealth: Payer: Self-pay

## 2023-03-11 NOTE — Telephone Encounter (Signed)
 Patient calls nurse line regarding Neurology referral.   Advised that referral is pending and I will reach out to referral coordinator regarding status and which office she is being referred to.   Melvenia Beam- please let patient know which office she is being referred to.   Thanks.   Veronda Prude, RN

## 2023-03-14 ENCOUNTER — Ambulatory Visit: Payer: 59 | Admitting: Student

## 2023-03-14 ENCOUNTER — Other Ambulatory Visit: Payer: Self-pay

## 2023-03-14 DIAGNOSIS — E119 Type 2 diabetes mellitus without complications: Secondary | ICD-10-CM

## 2023-03-14 MED ORDER — METFORMIN HCL ER 500 MG PO TB24: 1000.0000 mg | ORAL_TABLET | Freq: Every day | ORAL | 3 refills | Status: AC

## 2023-03-25 ENCOUNTER — Ambulatory Visit (INDEPENDENT_AMBULATORY_CARE_PROVIDER_SITE_OTHER): Payer: 59 | Admitting: Neurology

## 2023-03-25 ENCOUNTER — Ambulatory Visit: Payer: 59 | Admitting: Neurology

## 2023-03-25 ENCOUNTER — Encounter: Payer: Self-pay | Admitting: Neurology

## 2023-03-25 VITALS — BP 182/75 | HR 73 | Ht 61.0 in | Wt 141.0 lb

## 2023-03-25 DIAGNOSIS — M546 Pain in thoracic spine: Secondary | ICD-10-CM

## 2023-03-25 DIAGNOSIS — R200 Anesthesia of skin: Secondary | ICD-10-CM

## 2023-03-25 MED ORDER — DULOXETINE HCL 30 MG PO CPEP: 30.0000 mg | ORAL_CAPSULE | Freq: Every day | ORAL | 5 refills | Status: DC

## 2023-03-25 NOTE — Patient Instructions (Signed)
 Start Cymbalta 30mg  daily  Nerve testing of the left leg  Referral to physical therapy  ELECTROMYOGRAM AND NERVE CONDUCTION STUDIES (EMG/NCS) INSTRUCTIONS  How to Prepare The neurologist conducting the EMG will need to know if you have certain medical conditions. Tell the neurologist and other EMG lab personnel if you: Have a pacemaker or any other electrical medical device Take blood-thinning medications Have hemophilia, a blood-clotting disorder that causes prolonged bleeding Bathing Take a shower or bath shortly before your exam in order to remove oils from your skin. Don't apply lotions or creams before the exam.  What to Expect You'll likely be asked to change into a hospital gown for the procedure and lie down on an examination table. The following explanations can help you understand what will happen during the exam.  Electrodes. The neurologist or a technician places surface electrodes at various locations on your skin depending on where you're experiencing symptoms. Or the neurologist may insert needle electrodes at different sites depending on your symptoms.  Sensations. The electrodes will at times transmit a tiny electrical current that you may feel as a twinge or spasm. The needle electrode may cause discomfort or pain that usually ends shortly after the needle is removed. If you are concerned about discomfort or pain, you may want to talk to the neurologist about taking a short break during the exam.  Instructions. During the needle EMG, the neurologist will assess whether there is any spontaneous electrical activity when the muscle is at rest - activity that isn't present in healthy muscle tissue - and the degree of activity when you slightly contract the muscle.  He or she will give you instructions on resting and contracting a muscle at appropriate times. Depending on what muscles and nerves the neurologist is examining, he or she may ask you to change positions during the exam.   After your EMG You may experience some temporary, minor bruising where the needle electrode was inserted into your muscle. This bruising should fade within several days. If it persists, contact your primary care doctor.

## 2023-03-25 NOTE — Progress Notes (Signed)
 [facility] Clinic Note - Initial Visit   Date: 03/25/2023   Erin Lawson MRN: [id number] DOB: 1960/03/20   Dear Dr. McDiarmid:  Thank you for your kind referral of Erin Lawson for consultation of paresthesias. Although her history is well known to you, please allow Erin Lawson to reiterate it for the purpose of our medical record. The patient was accompanied to the clinic by self.    Erin Lawson is a 63 y.o. left-handed female with left breast cancer s/p lumpectomy, chemotherapy, and radiation (2021), uncontrolled diabetes mellitus, and hypertension presenting for evaluation of paresthesias.   IMPRESSION/PLAN: Right thoracic pain radiating towards the scapula with associated paresthesias.  ?thoracic radiculopathy.  She has poorly controlled diabetes which can cause noncompressive radiculopathy.  - Start Cymbalta 30mg  daily (she does not want to take gabapentin)  - Start PT for upper back strengthening  - Consider MRI thoracic spine going forward  2.  Left foot numbness.  Symptoms do not fit a nerve distribution and exam is normal.   - NCS/EMG of the left leg  Further recommendations pending results.  ------------------------------------------------------------- History of present illness: Starting in late January 2024, she began having sharp pain over the right shoulder blade, which occurs daily and lasts all day.  She also has numbness of the right upper scapula area.  She did gets relief with Biofreeze.  No improvement with robaxin. There is no specific trigger.  She denies any injury to her neck or back. No weakness in the arms.  She also complaints of episodic numbness over the dorsum of the left foot but spares her toes.  She does not have similar symptoms in the right foot, low back pain, or radicular leg pain.   She works as a Biochemist, clinical.   Out-side paper records, electronic medical record, and images have been  reviewed where available and summarized as:  Lab Results  Component Value Date   HGBA1C 9.7 (A) 04/26/2022   No results found for: "VITAMINB12" Lab Results  Component Value Date   TSH 2.580 08/04/2017   No results found for: "ESRSEDRATE", "POCTSEDRATE"  Past Medical History:  Diagnosis Date   Anemia    Anxiety    At increased risk for cardiovascular disease 08/29/2020   Breast cancer (HCC)    Cancer (HCC) 10/2018   left breast IDC   Chest pain 05/18/2012   DM type 2 (diabetes mellitus, type 2) (HCC) 05/18/2012   GERD (gastroesophageal reflux disease)    OTC prn   Hypertension    Pain in paraspinal region 03/30/2021   Personal history of chemotherapy    Personal history of radiation therapy     Past Surgical History:  Procedure Laterality Date   BREAST LUMPECTOMY WITH RADIOACTIVE SEED AND SENTINEL LYMPH NODE BIOPSY Left 04/21/2019   Procedure: LEFT BREAST LUMPECTOMY WITH RADIOACTIVE SEED AND SENTINEL LYMPH NODE MAPPING;  Surgeon: Harriette Bouillon, MD;  Location: [facility];  Service: General;  Laterality: Left;   CESAREAN SECTION     PORTACATH PLACEMENT Right 11/11/2018   Procedure: INSERTION PORT-A-CATH WITH ULTRASOUND;  Surgeon: Harriette Bouillon, MD;  Location: [facility];  Service: General;  Laterality: Right;   utreine polyp     resected     Medications:  Outpatient Encounter Medications as of 03/25/2023  Medication Sig   Accu-Chek Softclix Lancets lancets Check blood sugar 3 times a day   acetaminophen (TYLENOL 8 HOUR) 650 MG CR tablet Take 1 tablet (650 mg  total) by mouth every 8 (eight) hours as needed for pain.   blood glucose meter kit and supplies Dispense based on patient and insurance preference. Use up to four times daily as directed. (FOR ICD-10 E10.9, E11.9).   cetirizine (ZYRTEC ALLERGY) 10 MG tablet Take 1 tablet (10 mg total) by mouth daily.   Continuous Blood Gluc Sensor (FREESTYLE LIBRE 2 SENSOR) MISC Use to check blood sugar at  least 6 times a day before and 2 hours after meals   diclofenac Sodium (VOLTAREN) 1 % GEL Apply 4 g topically 2 (two) times daily as needed.   glucose blood (ACCU-CHEK GUIDE) test strip Check blood sugar 3 times per day   latanoprost (XALATAN) 0.005 % ophthalmic solution Place 1 drop into both eyes daily.   metFORMIN (GLUCOPHAGE-XR) 500 MG 24 hr tablet Take 2 tablets (1,000 mg total) by mouth daily with breakfast.   methocarbamol (ROBAXIN) 500 MG tablet Take 1 tablet (500 mg total) by mouth 2 (two) times daily.   [DISCONTINUED] prochlorperazine (COMPAZINE) 10 MG tablet Take 1 tablet (10 mg total) by mouth every 6 (six) hours as needed (Nausea or vomiting). (Patient not taking: Reported on 03/25/2023)   [DISCONTINUED] sitaGLIPtin-metformin (JANUMET) 50-1000 MG tablet Take 1 tablet by mouth 2 (two) times daily with a meal. (Patient not taking: No sig reported)   No facility-administered encounter medications on file as of 03/25/2023.    Allergies:  Allergies  Allergen Reactions   Atenolol Other (See Comments)    REACTION: made me feel bad REACTION: made me feel bad   Poractant Alfa Other (See Comments)    Migraine    Poractant Alfa Other (See Comments)    Other Reaction(s): Other (See Comments)  Migraine   Migraine   Migraine   Pork Allergy Other (See Comments)    Migraine   Migraine   Pork-Derived Products Other (See Comments)    Migraine    Meloxicam Rash    Family History: Family History  Problem Relation Age of Onset   Healthy Mother    Hypertension Sister    Heart attack Neg Hx     Social History: Social History   Tobacco Use   Smoking status: Never   Smokeless tobacco: Never  Substance Use Topics   Alcohol use: No   Drug use: No   Social History   Social History Narrative   Marital status: single; not dating      Children: twins in 1978; 3 grandchildren/boys      Lives: with friend, son      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 Left Handed    Are you currently employed ? 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 No    Who lives with you? Son and Friend   What type of home do you live in: 1 story or 2 story? Lives in a one story. Entry way has one flight stairs        Vital Signs:  BP (!) 182/75   Pulse 73   Ht 5\' 1"  (1.549 m)   Wt 141 lb (64 kg)   SpO2 100%   BMI  26.64 kg/m   Neurological Exam: MENTAL STATUS including orientation to time, place, person, recent and remote memory, attention span and concentration, language, and fund of knowledge is normal.  Speech is not dysarthric.  CRANIAL NERVES: II:  No visual field defects.     III-IV-VI: Pupils equal round and reactive to light.  Normal conjugate, extra-ocular eye movements in all directions of gaze.  No nystagmus.  No ptosis.   V:  Normal facial sensation.    VII:  Normal facial symmetry and movements.   VIII:  Normal hearing and vestibular function.   IX-X:  Normal palatal movement.   XI:  Normal shoulder shrug and head rotation.   XII:  Normal tongue strength and range of motion, no deviation or fasciculation.  MOTOR:  No atrophy, fasciculations or abnormal movements.  No pronator drift.   Upper Extremity:  Right  Left  Deltoid  5/5   5/5   Biceps  5/5   5/5   Triceps  5/5   5/5   Infraspinatus 5/5  5/5  Medial pectoralis 5/5  5/5  Wrist extensors  5/5   5/5   Wrist flexors  5/5   5/5   Finger extensors  5/5   5/5   Finger flexors  5/5   5/5   Dorsal interossei  5/5   5/5   Abductor pollicis  5/5   5/5   Tone (Ashworth scale)  0  0   Lower Extremity:  Right  Left  Hip flexors  5/5   5/5   Knee flexors  5/5   5/5   Knee extensors  5/5   5/5   Dorsiflexors  5/5   5/5   Plantarflexors  5/5   5/5   Toe extensors  5/5   5/5   Toe flexors  5/5   5/5    Tone (Ashworth scale)  0  0   MSRs:                                           Right        Left brachioradialis 2+  2+  biceps 2+  2+  triceps 2+  2+  patellar 2+  2+  ankle jerk 2+  2+  Hoffman no  no  plantar response down  down   SENSORY:  She uses a back scratcher to localize numbness to the right superior border of the scapula.  Sensation is mildly reduced to temperature and pin prick over this area.  Sensation to all modalities is intact in the arms and legs.   COORDINATION/GAIT: Normal finger-to- nose-finger.  Intact rapid alternating movements bilaterally.  Able to rise from a chair without using arms.  Gait narrow based and stable.    Thank you for allowing me to participate in patient's care.  If I can answer any additional questions, I would be pleased to do so.    Sincerely,    Marquis Down K. Allena Katz, DO

## 2023-04-01 ENCOUNTER — Ambulatory Visit: Payer: 59 | Admitting: Neurology

## 2023-04-03 ENCOUNTER — Ambulatory Visit: Attending: Neurology

## 2023-04-03 VITALS — BP 186/97 | HR 71

## 2023-04-03 DIAGNOSIS — M6281 Muscle weakness (generalized): Secondary | ICD-10-CM | POA: Diagnosis present

## 2023-04-03 DIAGNOSIS — M546 Pain in thoracic spine: Secondary | ICD-10-CM | POA: Diagnosis not present

## 2023-04-03 DIAGNOSIS — R293 Abnormal posture: Secondary | ICD-10-CM | POA: Insufficient documentation

## 2023-04-03 DIAGNOSIS — R200 Anesthesia of skin: Secondary | ICD-10-CM | POA: Diagnosis not present

## 2023-04-03 NOTE — Therapy (Signed)
 OUTPATIENT PHYSICAL THERAPY NEURO EVALUATION   Patient Name: Rhiannon Sassaman MRN: [id number] DOB:1960-11-10, 63 y.o., female Today's Date: 04/03/2023   PCP: Jerre Simon, MD REFERRING PROVIDER: Nita Sickle, DO  END OF SESSION:  PT End of Session - 04/03/23 0748     Visit Number 1    Number of Visits 9    Date for PT Re-Evaluation 05/02/23    Authorization Type [employer]    PT Start Time 0800    PT Stop Time 0842    PT Time Calculation (min) 42 min    Activity Tolerance Patient tolerated treatment well;Patient limited by pain    Behavior During Therapy [facility] for tasks assessed/performed             Past Medical History:  Diagnosis Date   Anemia    Anxiety    At increased risk for cardiovascular disease 08/29/2020   Breast cancer (HCC)    Cancer (HCC) 10/2018   left breast IDC   Chest pain 05/18/2012   DM type 2 (diabetes mellitus, type 2) (HCC) 05/18/2012   GERD (gastroesophageal reflux disease)    OTC prn   Hypertension    Pain in paraspinal region 03/30/2021   Personal history of chemotherapy    Personal history of radiation therapy    Past Surgical History:  Procedure Laterality Date   BREAST LUMPECTOMY WITH RADIOACTIVE SEED AND SENTINEL LYMPH NODE BIOPSY Left 04/21/2019   Procedure: LEFT BREAST LUMPECTOMY WITH RADIOACTIVE SEED AND SENTINEL LYMPH NODE MAPPING;  Surgeon: Harriette Bouillon, MD;  Location: [facility];  Service: General;  Laterality: Left;   CESAREAN SECTION     PORTACATH PLACEMENT Right 11/11/2018   Procedure: INSERTION PORT-A-CATH WITH ULTRASOUND;  Surgeon: Harriette Bouillon, MD;  Location: [facility];  Service: General;  Laterality: Right;   utreine polyp     resected   Patient Active Problem List   Diagnosis Date Noted   Mononeuropathy 04/26/2022   Acute pain of right knee 04/01/2022   Left-sided chest pain 11/05/2021   Grief 11/05/2021   Odynophagia 07/31/2021   Parotitis 07/16/2021   Neck swelling 07/10/2021    Right forearm pain 06/04/2021   Postherpetic neuralgia 05/08/2021   Globus pharyngeus 03/08/2021   Primary open angle glaucoma 08/29/2020   Health care maintenance 08/29/2020   Port-A-Cath in place 12/23/2018   Malignant neoplasm of upper-inner quadrant of left breast in female, estrogen receptor negative (HCC) 10/26/2018   Vitamin D deficiency 12/12/2017   Primary osteoarthritis of left knee 12/12/2017   Mild anemia 05/18/2012   DM type 2 (diabetes mellitus, type 2) (HCC) 05/18/2012   Essential hypertension 04/06/2007   KELOID 04/06/2007    ONSET DATE: 03/25/23 referral  REFERRING DIAG:  R20.0 (ICD-10-CM) - Numbness of left foot  M54.6 (ICD-10-CM) - Acute right-sided thoracic back pain    THERAPY DIAG:  Abnormal posture - Plan: PT plan of care cert/re-cert  Muscle weakness (generalized) - Plan: PT plan of care cert/re-cert  Rationale for Evaluation and Treatment: Rehabilitation  SUBJECTIVE:  SUBJECTIVE STATEMENT: Patient arrives to clinic, no AD. She reports pins/needles and "feeling like fire" in her upper mid back. She wonders if its her breast cancer markers. She does have a red mark on her LUQ and states this is where her pain originated. 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 Does have a h/o L breast CA. Feels as though the pain is all the way to her bone and points to R inferior angle of scapula. Patient also endorses L foot numbness that does not follow a dermatomal pattern (plantar surface of arch to dorsum of foot at base of toes). Is not taking the cymbalta due to fear of side effects (namely the SI).  Pt accompanied by: self  PERTINENT HISTORY: anemia, anxiety, breast CA s/p chemo/radiation, DMII, GERD, HTN  PAIN:  Are you having pain? Yes: NPRS  scale: 8/10 Pain location: R inferior angle of scapula Pain description: tingling, burning, deep to the bone, "just feels like glass cutting into my skin" Aggravating factors: "it's just there" Relieving factors: aleve, biofreeze, vicks vaporub   PRECAUTIONS: None   WEIGHT BEARING RESTRICTIONS: No  FALLS: Has patient fallen in last 6 months? No  LIVING ENVIRONMENT: Lives with: lives with their son Lives in: House/apartment  PLOF: Independent  PATIENT GOALS: to not be in pain  OBJECTIVE:  Note: Objective measures were completed at Evaluation unless otherwise noted.  DIAGNOSTIC FINDINGS: 11/05/21 T-spine xray IMPRESSION: No acute fracture or dislocation. Scoliosis of spine. No significant degenerative joint changes noted.  COGNITION: Overall cognitive status: Within functional limits for tasks assessed   SENSATION: Fluctuating N/T in L plantar surface and dorsum of foot   POSTURE: rounded shoulders  Palpation: -significant muscle tone in R paraspinals and rhomboids and infraspinatus                                                                                                                            TREATMENT  Manual:  -grade I-III A/P mobs to mid thoracic   -inadevertent cavitation at T4 -MFR to paraspinals and R infraspinatus   Self care/home management: -ddx, pathophys of pinched nerve, anatomy of shoulder/paraspinals as it relates to patient presentation, intro to TPDN -need to f/u re: HTN  PATIENT EDUCATION: Education details: PT POC, exam findings, f/u with oncologist re: concerns about markers moving, see above Person educated: Patient Education method: Explanation and Demonstration Education comprehension: verbalized understanding and needs further education  HOME EXERCISE PROGRAM: To be provided  GOALS: Goals reviewed with patient? Yes  SHORT TERM GOALS: = LTG based on PT POC  LONG TERM GOALS: Target date: 05/02/23  Pt will be independent  with final HEP for improved symptom report  Baseline: to be provided Goal status: INITIAL  2.  Patient will report an average pain of </= 6/10 over the course of a week to demonstrate reduction in symptoms Baseline: 8/10 Goal status: INITIAL  3.  SPADI goal Baseline: to  be completed Goal status: INITIAL   ASSESSMENT:  CLINICAL IMPRESSION: Patient is a 63 y.o. female who was seen today for physical therapy evaluation and treatment for pain in her R mid-thoracic region. She presents with significant muscle tone in her R infraspinatus, rhomboids and paraspinals. Tolerated Grade I-III A/P mid thoracic mobs for both pain relief and gentle ROM. Inadvertent cavitation at T4 with pain relief reported. Patient would benefit from skilled PT session to address the above mentioned deficits.   OBJECTIVE IMPAIRMENTS: decreased knowledge of condition, decreased mobility, decreased ROM, decreased strength, increased fascial restrictions, increased muscle spasms, impaired sensation, impaired tone, impaired UE functional use, postural dysfunction, and pain.   ACTIVITY LIMITATIONS: carrying, lifting, sleeping, reach over head, and hygiene/grooming  PARTICIPATION LIMITATIONS: interpersonal relationship, driving, shopping, community activity, occupation, and yard work  PERSONAL FACTORS: Past/current experiences, Profession, and Time since onset of injury/illness/exacerbation are also affecting patient's functional outcome.   REHAB POTENTIAL: Good  CLINICAL DECISION MAKING: Stable/uncomplicated  EVALUATION COMPLEXITY: Moderate  PLAN:  PT FREQUENCY: 1-2x/week  PT DURATION: 4 weeks  PLANNED INTERVENTIONS: 97164- PT Re-evaluation, 97110-Therapeutic exercises, 97530- Therapeutic activity, 97112- Neuromuscular re-education, 97535- Self Care, 08657- Manual therapy, [phone]- Gait training, [phone]- Aquatic Therapy, [phone]- Electrical stimulation (manual), Patient/Family education, Balance training, Stair  training, Taping, Dry Needling, Joint mobilization, Joint manipulation, Spinal manipulation, Spinal mobilization, Vestibular training, Visual/preceptual remediation/compensation, DME instructions, Cryotherapy, and Moist heat  PLAN FOR NEXT SESSION: SPADI, TPDN, HEP   Westley Foots, PT Westley Foots, PT, DPT, CBIS  04/03/2023, 8:57 AM

## 2023-04-04 ENCOUNTER — Encounter: Payer: Self-pay | Admitting: Hematology and Oncology

## 2023-04-08 ENCOUNTER — Inpatient Hospital Stay: Attending: Adult Health | Admitting: Adult Health

## 2023-04-08 ENCOUNTER — Other Ambulatory Visit: Payer: Self-pay | Admitting: *Deleted

## 2023-04-08 ENCOUNTER — Other Ambulatory Visit: Payer: Self-pay | Admitting: Adult Health

## 2023-04-08 ENCOUNTER — Ambulatory Visit (HOSPITAL_COMMUNITY)
Admission: RE | Admit: 2023-04-08 | Discharge: 2023-04-08 | Disposition: A | Discharge status: Home / Self Care | Source: Ambulatory Visit | Attending: Adult Health | Admitting: Adult Health

## 2023-04-08 VITALS — BP 163/65 | HR 73 | Temp 97.7°F | Resp 18 | Ht 61.0 in | Wt 139.8 lb

## 2023-04-08 DIAGNOSIS — Z171 Estrogen receptor negative status [ER-]: Secondary | ICD-10-CM

## 2023-04-08 DIAGNOSIS — Z9221 Personal history of antineoplastic chemotherapy: Secondary | ICD-10-CM | POA: Insufficient documentation

## 2023-04-08 DIAGNOSIS — Z853 Personal history of malignant neoplasm of breast: Secondary | ICD-10-CM | POA: Insufficient documentation

## 2023-04-08 DIAGNOSIS — M25511 Pain in right shoulder: Secondary | ICD-10-CM | POA: Diagnosis not present

## 2023-04-08 DIAGNOSIS — C50212 Malignant neoplasm of upper-inner quadrant of left female breast: Secondary | ICD-10-CM

## 2023-04-08 DIAGNOSIS — M549 Dorsalgia, unspecified: Secondary | ICD-10-CM | POA: Diagnosis not present

## 2023-04-08 NOTE — Progress Notes (Deleted)
 [facility] Cancer Follow up:    Erin Simon, MD [address] [place] [address]   DIAGNOSIS: Cancer Staging  Malignant neoplasm of upper-inner quadrant of left breast in female, estrogen receptor negative (HCC) Staging form: Breast, AJCC 8th Edition - Clinical stage from 10/28/2018: Stage IIA (cT2, cN0, cM0, G2, ER-, PR-, HER2+) - Signed by Serena Croissant, MD on 10/28/2018 Stage prefix: Initial diagnosis Histologic grading system: 3 grade system Laterality: Left Staged by: Pathologist and managing physician Stage used in treatment planning: Yes National guidelines used in treatment planning: Yes Type of national guideline used in treatment planning: NCCN - Pathologic stage from 04/21/2019: No Stage Recommended (ypT1a, pN0, cM0, ER-, PR-, HER2+) - Unsigned Stage prefix: Post-therapy   SUMMARY OF ONCOLOGIC HISTORY: Oncology History  Malignant neoplasm of upper-inner quadrant of left breast in female, estrogen receptor negative (HCC)  10/26/2018 Initial Diagnosis   Patient palpated a left breast mass x 5 weeks. Mammogram showed a 2.8cm mass with calcifications spanning 3.2cm in the left breast and two mildly thickened axillary lymph nodes up to 0.4cm. Biopsy showed IDC, grade 3, HER-2 + (3+), ER/PR -, Ki67 50%, and no evidence of carcinoma in the left axilla.    10/28/2018 Cancer Staging   Staging form: Breast, AJCC 8th Edition - Clinical stage from 10/28/2018: Stage IIA (cT2, cN0, cM0, G2, ER-, PR-, HER2+) - Signed by Serena Croissant, MD on 10/28/2018   11/12/2018 - 03/08/2019 Chemotherapy   The patient had dexamethasone (DECADRON) 4 MG tablet, 4 mg (100 % of original dose 4 mg), Oral, 2 times daily, 1 of 1 cycle, Start date: 10/28/2018, End date: 11/20/2018 Dose modification: 4 mg (original dose 4 mg, Cycle 0) palonosetron (ALOXI) injection 0.25 mg, 0.25 mg, Intravenous,  Once, 6 of 6 cycles Administration: 0.25 mg (11/12/2018), 0.25 mg (12/03/2018), 0.25 mg  (02/10/2019), 0.25 mg (03/05/2019), 0.25 mg (12/23/2018), 0.25 mg (01/18/2019) pegfilgrastim (NEULASTA) injection 6 mg, 6 mg, Subcutaneous, Once, 5 of 5 cycles Administration: 6 mg (12/05/2018), 6 mg (12/25/2018), 6 mg (01/20/2019), 6 mg (02/12/2019), 6 mg (03/08/2019) pegfilgrastim-jmdb (FULPHILA) injection 6 mg, 6 mg, Subcutaneous,  Once, 1 of 1 cycle Administration: 6 mg (11/14/2018) CARBOplatin (PARAPLATIN) 500 mg in sodium chloride 0.9 % 250 mL chemo infusion, 500 mg (101.8 % of original dose 493 mg), Intravenous,  Once, 6 of 6 cycles Dose modification:   (original dose 493 mg, Cycle 1, Reason: Provider Judgment) Administration: 500 mg (11/12/2018), 420 mg (12/03/2018), 420 mg (02/10/2019), 420 mg (03/05/2019), 420 mg (12/23/2018), 420 mg (01/18/2019) DOCEtaxel (TAXOTERE) 110 mg in sodium chloride 0.9 % 250 mL chemo infusion, 65 mg/m2 = 110 mg (100 % of original dose 65 mg/m2), Intravenous,  Once, 6 of 6 cycles Dose modification: 65 mg/m2 (original dose 65 mg/m2, Cycle 1, Reason: Provider Judgment), 50 mg/m2 (original dose 65 mg/m2, Cycle 2, Reason: Dose not tolerated), 40 mg/m2 (original dose 65 mg/m2, Cycle 5, Reason: Dose not tolerated) Administration: 110 mg (11/12/2018), 80 mg (12/03/2018), 70 mg (02/10/2019), 70 mg (03/05/2019), 70 mg (12/23/2018), 70 mg (01/18/2019) pertuzumab (PERJETA) 420 mg in sodium chloride 0.9 % 250 mL chemo infusion, 420 mg (100 % of original dose 420 mg), Intravenous, Once, 6 of 6 cycles Dose modification: 420 mg (original dose 420 mg, Cycle 1, Reason: Provider Judgment) Administration: 420 mg (11/12/2018), 420 mg (12/03/2018), 420 mg (02/10/2019), 420 mg (03/05/2019), 420 mg (12/23/2018), 420 mg (01/18/2019) fosaprepitant (EMEND) 150 mg, dexamethasone (DECADRON) 12 mg in sodium chloride 0.9 % 145 mL IVPB, ,  Intravenous,  Once, 6 of 6 cycles Administration:  (11/12/2018),  (12/03/2018),  (02/10/2019),  (03/05/2019),  (12/23/2018),  (01/18/2019) trastuzumab-anns (KANJINTI) 399 mg in sodium  chloride 0.9 % 250 mL chemo infusion, 6 mg/kg = 399 mg (100 % of original dose 6 mg/kg), Intravenous,  Once, 5 of 5 cycles Dose modification: 6 mg/kg (original dose 6 mg/kg, Cycle 2, Reason: Other (see comments), Comment: J-code in auth referral is for Kanjinti) Administration: 399 mg (12/03/2018), 399 mg (12/23/2018), 399 mg (01/18/2019), 399 mg (02/10/2019), 399 mg (03/05/2019) trastuzumab-dkst (OGIVRI) 525 mg in sodium chloride 0.9 % 250 mL chemo infusion, 8 mg/kg = 525 mg, Intravenous,  Once, 1 of 1 cycle Administration: 525 mg (11/12/2018)  for chemotherapy treatment.    04/02/2019 - 11/08/2019 Chemotherapy         04/21/2019 Surgery   Left lumpectomy ([facility]): multiple foci of residual carcinoma (0.25 cm max), clear margins, 4 left axillary lymph nodes negative.    05/25/2019 - 06/22/2019 Radiation Therapy   Adjuvant radiation     CURRENT THERAPY:  INTERVAL HISTORY: Erin Lawson 63 y.o. female returns for    Patient Active Problem List   Diagnosis Date Noted  . Mononeuropathy 04/26/2022  . Acute pain of right knee 04/01/2022  . Left-sided chest pain 11/05/2021  . Grief 11/05/2021  . Odynophagia 07/31/2021  . Parotitis 07/16/2021  . Neck swelling 07/10/2021  . Right forearm pain 06/04/2021  . Postherpetic neuralgia 05/08/2021  . Globus pharyngeus 03/08/2021  . Primary open angle glaucoma 08/29/2020  . Health care maintenance 08/29/2020  . Port-A-Cath in place 12/23/2018  . Malignant neoplasm of upper-inner quadrant of left breast in female, estrogen receptor negative (HCC) 10/26/2018  . Vitamin D deficiency 12/12/2017  . Primary osteoarthritis of left knee 12/12/2017  . Mild anemia 05/18/2012  . DM type 2 (diabetes mellitus, type 2) (HCC) 05/18/2012  . Essential hypertension 04/06/2007  . KELOID 04/06/2007    is allergic to atenolol, poractant alfa, poractant alfa, pork allergy, pork-derived products, and meloxicam.  MEDICAL HISTORY: Past Medical History:   Diagnosis Date  . Anemia   . Anxiety   . At increased risk for cardiovascular disease 08/29/2020  . Breast cancer (HCC)   . Cancer (HCC) 10/2018   left breast IDC  . Chest pain 05/18/2012  . DM type 2 (diabetes mellitus, type 2) (HCC) 05/18/2012  . GERD (gastroesophageal reflux disease)    OTC prn  . Hypertension   . Pain in paraspinal region 03/30/2021  . Personal history of chemotherapy   . Personal history of radiation therapy     SURGICAL HISTORY: Past Surgical History:  Procedure Laterality Date  . BREAST LUMPECTOMY WITH RADIOACTIVE SEED AND SENTINEL LYMPH NODE BIOPSY Left 04/21/2019   Procedure: LEFT BREAST LUMPECTOMY WITH RADIOACTIVE SEED AND SENTINEL LYMPH NODE MAPPING;  Surgeon: Harriette Bouillon, MD;  Location: [facility];  Service: General;  Laterality: Left;  . CESAREAN SECTION    . PORTACATH PLACEMENT Right 11/11/2018   Procedure: INSERTION PORT-A-CATH WITH ULTRASOUND;  Surgeon: Harriette Bouillon, MD;  Location: [facility];  Service: General;  Laterality: Right;  . utreine polyp     resected    SOCIAL HISTORY: Social History   Socioeconomic History  . Marital status: Single    Spouse name: Not on file  . Number of children: Not on file  . Years of education: Not on file  . Highest education level: Not on file  Occupational History  . Occupation: cna  Comment: [employer]  . Occupation: Biochemist, clinical  Tobacco Use  . Smoking status: Never  . Smokeless tobacco: Never  Vaping Use  . Vaping status: Not on file  Substance and Sexual Activity  . Alcohol use: No  . Drug use: No  . Sexual activity: Not Currently    Birth control/protection: Post-menopausal  Other Topics Concern  . 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 Left Handed    Are you currently employed ? 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 No    Who lives with you? Son and Friend   What type of home do you live in: 1 story or 2 story? Lives in a one story. Entry way has one flight stairs       Social Drivers of Corporate investment banker Strain: Not on file  Food Insecurity: Not on file  Transportation Needs: Not on file  Physical Activity: Not on file  Stress: Not on file  Social Connections: Not on file  Intimate Partner Violence: Not on file    FAMILY HISTORY: Family History  Problem Relation Age of Onset  . Healthy Mother   . Hypertension Sister   . Heart attack Neg Hx     Review of Systems - Oncology    PHYSICAL EXAMINATION    There were no vitals filed for this visit.  Physical Exam  LABORATORY DATA:  CBC    Component Value Date/Time   WBC 7.6 08/25/2020 1803   RBC 4.96 08/25/2020 1803   HGB 12.2 08/25/2020 1826   HGB 10.5 (L) 11/08/2019 1120   HGB 11.5 07/21/2018 1216   HCT 36.0 08/25/2020 1826   HCT 35.5 07/21/2018 1216   PLT 359 08/25/2020 1803   PLT 337 11/08/2019 1120   PLT 449 07/21/2018 1216   MCV 73.8 (L) 08/25/2020 1803   MCV 70 (L) 07/21/2018 1216   MCH 23.8 (L) 08/25/2020 1803   MCHC 32.2 08/25/2020 1803   RDW 13.5 08/25/2020 1803   RDW 13.7 07/21/2018 1216   LYMPHSABS 3.1 08/25/2020 1803   LYMPHSABS 4.5 (H) 08/04/2017 1425   MONOABS 0.5 08/25/2020 1803   EOSABS 0.1 08/25/2020 1803   EOSABS 0.1 08/04/2017 1425   BASOSABS 0.1 08/25/2020 1803   BASOSABS 0.0 08/04/2017 1425    CMP     Component Value Date/Time   NA 138 11/05/2021 1144   K 4.3 11/05/2021 1144   CL 99 11/05/2021 1144   CO2 21 11/05/2021 1144   GLUCOSE 141 (H) 11/05/2021 1144   GLUCOSE 455 (H) 08/25/2020 1803   BUN 13  11/05/2021 1144   CREATININE 0.73 11/05/2021 1144   CREATININE 0.83 11/08/2019 1120   CREATININE 0.66 12/01/2015 1509   CALCIUM 9.8 11/05/2021 1144   PROT 6.5 08/25/2020 1803   PROT 7.3 10/12/2018 1219   ALBUMIN 3.8 08/25/2020 1803   ALBUMIN 4.7 10/12/2018 1219  AST 27 08/25/2020 1803   AST 18 11/08/2019 1120   ALT 26 08/25/2020 1803   ALT 20 11/08/2019 1120   ALKPHOS 111 08/25/2020 1803   BILITOT 0.4 08/25/2020 1803   BILITOT 0.3 11/08/2019 1120   GFRNONAA >60 08/25/2020 1803   GFRNONAA >60 11/08/2019 1120   GFRAA >60 09/08/2019 1110       PENDING LABS:   RADIOGRAPHIC STUDIES:  No results found.   PATHOLOGY:     ASSESSMENT and THERAPY PLAN:   No problem-specific Assessment & Plan notes found for this encounter.   No orders of the defined types were placed in this encounter.   All questions were answered. The patient knows to call the clinic with any problems, questions or concerns. We can certainly see the patient much sooner if necessary. This note was electronically signed. Noreene Filbert, NP 04/08/2023

## 2023-04-08 NOTE — Progress Notes (Signed)
 Left pt voicemail with information for appt at [facility] on 4/17 at 1:40 pm. Advised to arrive 15 min early and if she had any questions to call [facility].

## 2023-04-08 NOTE — Assessment & Plan Note (Signed)
 10/26/2018:Patient palpated a left breast mass x 5 weeks. Mammogram showed a 2.8cm mass with calcifications spanning 3.2cm in the left breast and two mildly thickened axillary lymph nodes up to 0.4cm. Biopsy showed IDC, grade 3, HER-2 + (3+), ER/PR -, Ki67 50%, and no evidence of carcinoma in the left axilla.    Treatment plan: 1. Neoadjuvant chemotherapy with TCH Perjeta 6 cycles (completed on 03/05/19) followed by Herceptin Perjeta maintenance for 1 year completed 11/08/2019 2. 04/21/2019:Left lumpectomy ([facility]): multiple foci of residual carcinoma (0.25 cm in greatest dimension), clear margins, 4 left axillary lymph nodes negative.  Repeat prognostic panel ER negative PR negative, Ki-67 40%, HER-2 positive 3. Followed by adjuvant radiation therapy completed 06/22/2019 ------------------------------------------------------------------------------------------------------------------------------------------------------------- Current treatment: Surveillance Patient continues to work full-time in [employer] system.     Breast and back pain Severe right-sided breast and back pain with burning and tingling sensation. Differential includes musculoskeletal pain, neuropathy, or cancer recurrence. Concern for bone abnormalities or breast markers. - Order x-rays of ribs, spine, and shoulder. - Schedule mammogram and discuss with radiologist. - Consider MRI or CT based on x-ray findings. - Continue current pain management.  Stage IIA HER2-positive breast cancer Diagnosed in 2020, treated with neoadjuvant chemotherapy, lumpectomy, adjuvant radiation, and Herceptin. Recent imaging showed no malignancy. New symptoms raise concern for recurrence. - Monitor for recurrence through imaging and clinical evaluation. - Discuss breast markers and potential recalls with radiologist. Hazle Nordmann Reveal testing ordered.   Medication concerns She expressed concerns about Cymbalta's side effects and decided  against taking it. - Discontinue Cymbalta.

## 2023-04-08 NOTE — Progress Notes (Signed)
 [facility] Cancer Follow up:    Erin Simon, MD [address] [place] [address]   DIAGNOSIS:  Cancer Staging  Malignant neoplasm of upper-inner quadrant of left breast in female, estrogen receptor negative (HCC) Staging form: Breast, AJCC 8th Edition - Clinical stage from 10/28/2018: Stage IIA (cT2, cN0, cM0, G2, ER-, PR-, HER2+) - Signed by Serena Croissant, MD on 10/28/2018 Stage prefix: Initial diagnosis Histologic grading system: 3 grade system Laterality: Left Staged by: Pathologist and managing physician Stage used in treatment planning: Yes National guidelines used in treatment planning: Yes Type of national guideline used in treatment planning: NCCN - Pathologic stage from 04/21/2019: No Stage Recommended (ypT1a, pN0, cM0, ER-, PR-, HER2+) - Unsigned Stage prefix: Post-therapy   SUMMARY OF ONCOLOGIC HISTORY: Oncology History  Malignant neoplasm of upper-inner quadrant of left breast in female, estrogen receptor negative (HCC)  10/26/2018 Initial Diagnosis   Patient palpated a left breast mass x 5 weeks. Mammogram showed a 2.8cm mass with calcifications spanning 3.2cm in the left breast and two mildly thickened axillary lymph nodes up to 0.4cm. Biopsy showed IDC, grade 3, HER-2 + (3+), ER/PR -, Ki67 50%, and no evidence of carcinoma in the left axilla.    10/28/2018 Cancer Staging   Staging form: Breast, AJCC 8th Edition - Clinical stage from 10/28/2018: Stage IIA (cT2, cN0, cM0, G2, ER-, PR-, HER2+) - Signed by Serena Croissant, MD on 10/28/2018   11/12/2018 - 03/08/2019 Chemotherapy   The patient had dexamethasone (DECADRON) 4 MG tablet, 4 mg (100 % of original dose 4 mg), Oral, 2 times daily, 1 of 1 cycle, Start date: 10/28/2018, End date: 11/20/2018 Dose modification: 4 mg (original dose 4 mg, Cycle 0) palonosetron (ALOXI) injection 0.25 mg, 0.25 mg, Intravenous,  Once, 6 of 6 cycles Administration: 0.25 mg (11/12/2018), 0.25 mg (12/03/2018), 0.25 mg  (02/10/2019), 0.25 mg (03/05/2019), 0.25 mg (12/23/2018), 0.25 mg (01/18/2019) pegfilgrastim (NEULASTA) injection 6 mg, 6 mg, Subcutaneous, Once, 5 of 5 cycles Administration: 6 mg (12/05/2018), 6 mg (12/25/2018), 6 mg (01/20/2019), 6 mg (02/12/2019), 6 mg (03/08/2019) pegfilgrastim-jmdb (FULPHILA) injection 6 mg, 6 mg, Subcutaneous,  Once, 1 of 1 cycle Administration: 6 mg (11/14/2018) CARBOplatin (PARAPLATIN) 500 mg in sodium chloride 0.9 % 250 mL chemo infusion, 500 mg (101.8 % of original dose 493 mg), Intravenous,  Once, 6 of 6 cycles Dose modification:   (original dose 493 mg, Cycle 1, Reason: Provider Judgment) Administration: 500 mg (11/12/2018), 420 mg (12/03/2018), 420 mg (02/10/2019), 420 mg (03/05/2019), 420 mg (12/23/2018), 420 mg (01/18/2019) DOCEtaxel (TAXOTERE) 110 mg in sodium chloride 0.9 % 250 mL chemo infusion, 65 mg/m2 = 110 mg (100 % of original dose 65 mg/m2), Intravenous,  Once, 6 of 6 cycles Dose modification: 65 mg/m2 (original dose 65 mg/m2, Cycle 1, Reason: Provider Judgment), 50 mg/m2 (original dose 65 mg/m2, Cycle 2, Reason: Dose not tolerated), 40 mg/m2 (original dose 65 mg/m2, Cycle 5, Reason: Dose not tolerated) Administration: 110 mg (11/12/2018), 80 mg (12/03/2018), 70 mg (02/10/2019), 70 mg (03/05/2019), 70 mg (12/23/2018), 70 mg (01/18/2019) pertuzumab (PERJETA) 420 mg in sodium chloride 0.9 % 250 mL chemo infusion, 420 mg (100 % of original dose 420 mg), Intravenous, Once, 6 of 6 cycles Dose modification: 420 mg (original dose 420 mg, Cycle 1, Reason: Provider Judgment) Administration: 420 mg (11/12/2018), 420 mg (12/03/2018), 420 mg (02/10/2019), 420 mg (03/05/2019), 420 mg (12/23/2018), 420 mg (01/18/2019) fosaprepitant (EMEND) 150 mg, dexamethasone (DECADRON) 12 mg in sodium chloride 0.9 % 145 mL  IVPB, , Intravenous,  Once, 6 of 6 cycles Administration:  (11/12/2018),  (12/03/2018),  (02/10/2019),  (03/05/2019),  (12/23/2018),  (01/18/2019) trastuzumab-anns (KANJINTI) 399 mg in sodium  chloride 0.9 % 250 mL chemo infusion, 6 mg/kg = 399 mg (100 % of original dose 6 mg/kg), Intravenous,  Once, 5 of 5 cycles Dose modification: 6 mg/kg (original dose 6 mg/kg, Cycle 2, Reason: Other (see comments), Comment: J-code in auth referral is for Kanjinti) Administration: 399 mg (12/03/2018), 399 mg (12/23/2018), 399 mg (01/18/2019), 399 mg (02/10/2019), 399 mg (03/05/2019) trastuzumab-dkst (OGIVRI) 525 mg in sodium chloride 0.9 % 250 mL chemo infusion, 8 mg/kg = 525 mg, Intravenous,  Once, 1 of 1 cycle Administration: 525 mg (11/12/2018)  for chemotherapy treatment.    04/02/2019 - 11/08/2019 Chemotherapy         04/21/2019 Surgery   Left lumpectomy ([facility]): multiple foci of residual carcinoma (0.25 cm max), clear margins, 4 left axillary lymph nodes negative.    05/25/2019 - 06/22/2019 Radiation Therapy   Adjuvant radiation     CURRENT THERAPY: observation  INTERVAL HISTORY:  Discussed the use of AI scribe software for clinical note transcription with the patient, who gave verbal consent to proceed.  Erin Lawson 63 y.o. female with a history of stage 2A HER2 positive breast cancer, presents with breast and back pain. The pain started about a month ago and is described as a sharp, burning sensation, likened to glass sticking through the skin. The pain initially started in the back, seemed to move from the breast area around to the back, and is now primarily located at the top part of the back, mostly on one side. The pain was initially so severe that the patient was unable to sit back on a chair. The pain is now daily but not as intense as when it first started. 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 The patient also reports a tingling sensation in both breasts.  The patient was previously prescribed Cymbalta, but after learning about the potential side effects, including suicidal thoughts, the patient has  decided not to take this medication. The patient has been referred to neurotherapy, but is unsure about the relevance of this treatment as she does not believe she has neuropathy. The patient is also concerned about the markers placed in the breast during cancer treatment, questioning if they have moved and if this could be contributing to the current pain.   Patient Active Problem List   Diagnosis Date Noted   Mononeuropathy 04/26/2022   Acute pain of right knee 04/01/2022   Left-sided chest pain 11/05/2021   Grief 11/05/2021   Odynophagia 07/31/2021   Parotitis 07/16/2021   Neck swelling 07/10/2021   Right forearm pain 06/04/2021   Postherpetic neuralgia 05/08/2021   Globus pharyngeus 03/08/2021   Primary open angle glaucoma 08/29/2020   Health care maintenance 08/29/2020   Port-A-Cath in place 12/23/2018   Malignant neoplasm of upper-inner quadrant of left breast in female, estrogen receptor negative (HCC) 10/26/2018   Vitamin D deficiency 12/12/2017   Primary osteoarthritis of left knee 12/12/2017   Mild anemia 05/18/2012   DM type 2 (diabetes mellitus, type 2) (HCC) 05/18/2012   Essential hypertension 04/06/2007   KELOID 04/06/2007    is allergic to atenolol, poractant alfa, poractant alfa, pork allergy, pork-derived products, and meloxicam.  MEDICAL HISTORY: Past Medical History:  Diagnosis Date   Anemia    Anxiety  At increased risk for cardiovascular disease 08/29/2020   Breast cancer (HCC)    Cancer (HCC) 10/2018   left breast IDC   Chest pain 05/18/2012   DM type 2 (diabetes mellitus, type 2) (HCC) 05/18/2012   GERD (gastroesophageal reflux disease)    OTC prn   Hypertension    Pain in paraspinal region 03/30/2021   Personal history of chemotherapy    Personal history of radiation therapy     SURGICAL HISTORY: Past Surgical History:  Procedure Laterality Date   BREAST LUMPECTOMY WITH RADIOACTIVE SEED AND SENTINEL LYMPH NODE BIOPSY Left 04/21/2019   Procedure:  LEFT BREAST LUMPECTOMY WITH RADIOACTIVE SEED AND SENTINEL LYMPH NODE MAPPING;  Surgeon: Harriette Bouillon, MD;  Location: [facility];  Service: General;  Laterality: Left;   CESAREAN SECTION     PORTACATH PLACEMENT Right 11/11/2018   Procedure: INSERTION PORT-A-CATH WITH ULTRASOUND;  Surgeon: Harriette Bouillon, MD;  Location: [facility];  Service: General;  Laterality: Right;   utreine polyp     resected    SOCIAL HISTORY: Social History   Socioeconomic History   Marital status: Single    Spouse name: Not on file   Number of children: Not on file   Years of education: Not on file   Highest education level: Not on file  Occupational History   Occupation: cna    Comment: [employer]   Occupation: Biochemist, clinical  Tobacco Use   Smoking status: Never   Smokeless tobacco: Never  Vaping Use   Vaping status: Not on file  Substance and Sexual Activity   Alcohol use: No   Drug use: No   Sexual activity: Not Currently    Birth control/protection: Post-menopausal  Other Topics Concern   Not on file  Social History Narrative   Marital status: single; not dating      Children: twins in 57; 3 grandchildren/boys      Lives: with friend, son      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 Left Handed    Are you currently employed ? 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 No    Who lives with you? Son and Friend   What type of home do you live in: 1 story or 2 story? Lives in a one story. Entry way has one flight stairs       Social Drivers of Corporate investment banker Strain: Not on file  Food Insecurity: Not on file  Transportation Needs: Not on file  Physical Activity: Not on file  Stress: Not on file  Social  Connections: Not on file  Intimate Partner Violence: Not on file    FAMILY HISTORY: Family History  Problem Relation Age of Onset   Healthy Mother    Hypertension Sister    Heart attack Neg Hx     Review of Systems  Constitutional:  Negative for appetite change, chills, fatigue, fever and unexpected weight change.  HENT:   Negative for hearing loss, lump/mass and trouble swallowing.   Eyes:  Negative for eye problems and icterus.  Respiratory:  Negative for chest  tightness, cough and shortness of breath.   Cardiovascular:  Negative for chest pain, leg swelling and palpitations.  Gastrointestinal:  Negative for abdominal distention, abdominal pain, constipation, diarrhea, nausea and vomiting.  Endocrine: Negative for hot flashes.  Genitourinary:  Negative for difficulty urinating.   Musculoskeletal:  Negative for arthralgias.  Skin:  Negative for itching and rash.  Neurological:  Negative for dizziness, extremity weakness, headaches and numbness.  Hematological:  Negative for adenopathy. Does not bruise/bleed easily.  Psychiatric/Behavioral:  Negative for depression. The patient is not nervous/anxious.       PHYSICAL EXAMINATION  Vitals:   04/08/23 1111  BP: (!) 163/65  Pulse: 73  Resp: 18  Temp: 97.7 F (36.5 C)  SpO2: 99%    Physical Exam Constitutional:      General: She is not in acute distress.    Appearance: Normal appearance. She is not toxic-appearing.  HENT:     Head: Normocephalic and atraumatic.     Mouth/Throat:     Mouth: Mucous membranes are moist.     Pharynx: Oropharynx is clear. No oropharyngeal exudate or posterior oropharyngeal erythema.  Eyes:     General: No scleral icterus. Cardiovascular:     Rate and Rhythm: Normal rate and regular rhythm.     Pulses: Normal pulses.     Heart sounds: Normal heart sounds.  Pulmonary:     Effort: Pulmonary effort is normal.     Breath sounds: Normal breath sounds.  Abdominal:     General: Abdomen is  flat. Bowel sounds are normal. There is no distension.     Palpations: Abdomen is soft.     Tenderness: There is no abdominal tenderness.  Musculoskeletal:        General: No swelling.     Cervical back: Neck supple.  Lymphadenopathy:     Cervical: No cervical adenopathy.  Skin:    General: Skin is warm and dry.     Findings: No rash.  Neurological:     General: No focal deficit present.     Mental Status: She is alert.  Psychiatric:        Mood and Affect: Mood normal.        Behavior: Behavior normal.     LABORATORY DATA:  CBC    Component Value Date/Time   WBC 7.6 08/25/2020 1803   RBC 4.96 08/25/2020 1803   HGB 12.2 08/25/2020 1826   HGB 10.5 (L) 11/08/2019 1120   HGB 11.5 07/21/2018 1216   HCT 36.0 08/25/2020 1826   HCT 35.5 07/21/2018 1216   PLT 359 08/25/2020 1803   PLT 337 11/08/2019 1120   PLT 449 07/21/2018 1216   MCV 73.8 (L) 08/25/2020 1803   MCV 70 (L) 07/21/2018 1216   MCH 23.8 (L) 08/25/2020 1803   MCHC 32.2 08/25/2020 1803   RDW 13.5 08/25/2020 1803   RDW 13.7 07/21/2018 1216   LYMPHSABS 3.1 08/25/2020 1803   LYMPHSABS 4.5 (H) 08/04/2017 1425   MONOABS 0.5 08/25/2020 1803   EOSABS 0.1 08/25/2020 1803   EOSABS 0.1 08/04/2017 1425   BASOSABS 0.1 08/25/2020 1803   BASOSABS 0.0 08/04/2017 1425    CMP     Component Value Date/Time   NA 138 11/05/2021 1144   K 4.3 11/05/2021 1144   CL 99 11/05/2021 1144   CO2 21 11/05/2021 1144   GLUCOSE 141 (H) 11/05/2021 1144   GLUCOSE 455 (H) 08/25/2020 1803   BUN 13 11/05/2021 1144   CREATININE 0.73 11/05/2021 1144  CREATININE 0.83 11/08/2019 1120   CREATININE 0.66 12/01/2015 1509   CALCIUM 9.8 11/05/2021 1144   PROT 6.5 08/25/2020 1803   PROT 7.3 10/12/2018 1219   ALBUMIN 3.8 08/25/2020 1803   ALBUMIN 4.7 10/12/2018 1219   AST 27 08/25/2020 1803   AST 18 11/08/2019 1120   ALT 26 08/25/2020 1803   ALT 20 11/08/2019 1120   ALKPHOS 111 08/25/2020 1803   BILITOT 0.4 08/25/2020 1803   BILITOT 0.3  11/08/2019 1120   GFRNONAA >60 08/25/2020 1803   GFRNONAA >60 11/08/2019 1120   GFRAA >60 09/08/2019 1110        ASSESSMENT and THERAPY PLAN:   Malignant neoplasm of upper-inner quadrant of left breast in female, estrogen receptor negative (HCC) 10/26/2018:Patient palpated a left breast mass x 5 weeks. Mammogram showed a 2.8cm mass with calcifications spanning 3.2cm in the left breast and two mildly thickened axillary lymph nodes up to 0.4cm. Biopsy showed IDC, grade 3, HER-2 + (3+), ER/PR -, Ki67 50%, and no evidence of carcinoma in the left axilla.    Treatment plan: 1. Neoadjuvant chemotherapy with TCH Perjeta 6 cycles (completed on 03/05/19) followed by Herceptin Perjeta maintenance for 1 year completed 11/08/2019 2. 04/21/2019:Left lumpectomy ([facility]): multiple foci of residual carcinoma (0.25 cm in greatest dimension), clear margins, 4 left axillary lymph nodes negative.  Repeat prognostic panel ER negative PR negative, Ki-67 40%, HER-2 positive 3. Followed by adjuvant radiation therapy completed 06/22/2019 ------------------------------------------------------------------------------------------------------------------------------------------------------------- Current treatment: Surveillance Patient continues to work full-time in [employer] system.     Breast and back pain Severe right-sided breast and back pain with burning and tingling sensation. Differential includes musculoskeletal pain, neuropathy, or cancer recurrence. Concern for bone abnormalities or breast markers. - Order x-rays of ribs, spine, and shoulder. - Schedule mammogram and discuss with radiologist. - Consider MRI or CT based on x-ray findings. - Continue current pain management.  Stage IIA HER2-positive breast cancer Diagnosed in 2020, treated with neoadjuvant chemotherapy, lumpectomy, adjuvant radiation, and Herceptin. Recent imaging showed no malignancy. New symptoms raise concern for recurrence. -  Monitor for recurrence through imaging and clinical evaluation. - Discuss breast markers and potential recalls with radiologist. Hazle Nordmann Reveal testing ordered.   Medication concerns She expressed concerns about Cymbalta's side effects and decided against taking it. - Discontinue Cymbalta.  All questions were answered. The patient knows to call the clinic with any problems, questions or concerns. We can certainly see the patient much sooner if necessary.  Total encounter time:30 minutes*in face-to-face visit time, chart review, lab review, care coordination, order entry, and documentation of the encounter time.  Lillard Anes, NP 04/08/23 3:28 PM Medical Oncology and Hematology [facility] [address] [place], [place] [address] Tel. [phone]    Fax. [phone]  *Total Encounter Time as defined by the Centers for Medicare and Medicaid Services includes, in addition to the face-to-face time of a patient visit (documented in the note above) non-face-to-face time: obtaining and reviewing outside history, ordering and reviewing medications, tests or procedures, care coordination (communications with other health care professionals or caregivers) and documentation in the medical record.

## 2023-04-11 ENCOUNTER — Ambulatory Visit

## 2023-04-11 ENCOUNTER — Other Ambulatory Visit: Payer: Self-pay | Admitting: Adult Health

## 2023-04-11 ENCOUNTER — Telehealth: Payer: Self-pay

## 2023-04-11 DIAGNOSIS — G589 Mononeuropathy, unspecified: Secondary | ICD-10-CM

## 2023-04-11 DIAGNOSIS — R293 Abnormal posture: Secondary | ICD-10-CM | POA: Diagnosis not present

## 2023-04-11 DIAGNOSIS — C50212 Malignant neoplasm of upper-inner quadrant of left female breast: Secondary | ICD-10-CM

## 2023-04-11 DIAGNOSIS — M6281 Muscle weakness (generalized): Secondary | ICD-10-CM

## 2023-04-11 NOTE — Telephone Encounter (Signed)
 Spoke with patient in regards to X-ray results. Informed patient that if radiology does not call patient by middle of next week for MRI, gave patient number to call and schedule. Patient verbalized understanding.

## 2023-04-11 NOTE — Progress Notes (Signed)
 Xrays are negative.  MRI spine ordered to r/o malignancy after review with Dr. Pamelia Hoit.    Lillard Anes, NP 04/11/23 1:39 PM Medical Oncology and Hematology [facility] [address] [place], [place] [address] Tel. [phone]    Fax. [phone]

## 2023-04-11 NOTE — Telephone Encounter (Signed)
 Tried to reach patient in regards to Xray results.  Per Mardella Layman, DNP- X-ray shows no signs of cancer recurrence.  Mardella Layman, DNP spoke with Dr. Pamelia Hoit and he wants to order MRI of the spine to further evaluate.  LVM for return call for results.

## 2023-04-11 NOTE — Therapy (Signed)
 OUTPATIENT PHYSICAL THERAPY NEURO TREATMENT   Patient Name: Erin Lawson MRN: [id number] DOB:1960-02-27, 63 y.o., female Today's Date: 04/11/2023   PCP: Jerre Simon, MD REFERRING PROVIDER: Nita Sickle, DO  END OF SESSION:  PT End of Session - 04/11/23 0842     Visit Number 2    Number of Visits 9    Date for PT Re-Evaluation 05/02/23    Authorization Type UHC    PT Start Time 0845    PT Stop Time 0928    PT Time Calculation (min) 43 min    Activity Tolerance Patient tolerated treatment well    Behavior During Therapy [facility] for tasks assessed/performed             Past Medical History:  Diagnosis Date   Anemia    Anxiety    At increased risk for cardiovascular disease 08/29/2020   Breast cancer (HCC)    Cancer (HCC) 10/2018   left breast IDC   Chest pain 05/18/2012   DM type 2 (diabetes mellitus, type 2) (HCC) 05/18/2012   GERD (gastroesophageal reflux disease)    OTC prn   Hypertension    Pain in paraspinal region 03/30/2021   Personal history of chemotherapy    Personal history of radiation therapy    Past Surgical History:  Procedure Laterality Date   BREAST LUMPECTOMY WITH RADIOACTIVE SEED AND SENTINEL LYMPH NODE BIOPSY Left 04/21/2019   Procedure: LEFT BREAST LUMPECTOMY WITH RADIOACTIVE SEED AND SENTINEL LYMPH NODE MAPPING;  Surgeon: Harriette Bouillon, MD;  Location: [facility];  Service: General;  Laterality: Left;   CESAREAN SECTION     PORTACATH PLACEMENT Right 11/11/2018   Procedure: INSERTION PORT-A-CATH WITH ULTRASOUND;  Surgeon: Harriette Bouillon, MD;  Location: [facility];  Service: General;  Laterality: Right;   utreine polyp     resected   Patient Active Problem List   Diagnosis Date Noted   Mononeuropathy 04/26/2022   Acute pain of right knee 04/01/2022   Left-sided chest pain 11/05/2021   Grief 11/05/2021   Odynophagia 07/31/2021   Parotitis 07/16/2021   Neck swelling 07/10/2021   Right forearm pain  06/04/2021   Postherpetic neuralgia 05/08/2021   Globus pharyngeus 03/08/2021   Primary open angle glaucoma 08/29/2020   Health care maintenance 08/29/2020   Port-A-Cath in place 12/23/2018   Malignant neoplasm of upper-inner quadrant of left breast in female, estrogen receptor negative (HCC) 10/26/2018   Vitamin D deficiency 12/12/2017   Primary osteoarthritis of left knee 12/12/2017   Mild anemia 05/18/2012   DM type 2 (diabetes mellitus, type 2) (HCC) 05/18/2012   Essential hypertension 04/06/2007   KELOID 04/06/2007    ONSET DATE: 03/25/23 referral  REFERRING DIAG:  R20.0 (ICD-10-CM) - Numbness of left foot  M54.6 (ICD-10-CM) - Acute right-sided thoracic back pain    THERAPY DIAG:  Abnormal posture  Muscle weakness (generalized)  Rationale for Evaluation and Treatment: Rehabilitation  SUBJECTIVE:  SUBJECTIVE STATEMENT: Patient reports doing the same. Images resulted as a negative. Denies falls.  Pt accompanied by: self  PERTINENT HISTORY: anemia, anxiety, breast CA s/p chemo/radiation, DMII, GERD, HTN  PAIN:  Are you having pain? Yes: NPRS scale: 7/10 Pain location: R inferior angle of scapula Pain description: tingling, burning, deep to the bone, "just feels like glass cutting into my skin" Aggravating factors: "it's just there" Relieving factors: aleve, biofreeze, vicks vaporub   PRECAUTIONS: None  PATIENT GOALS: to not be in pain  OBJECTIVE:  Note: Objective measures were completed at Evaluation unless otherwise noted.  DIAGNOSTIC FINDINGS: 11/05/21 T-spine xray IMPRESSION: No acute fracture or dislocation. Scoliosis of spine. No significant degenerative joint changes noted.                                                                                                                              TREATMENT  Theract: -Oswestry: 19/50 (38%)  Manual:  -MFR to R rhomboid and thoracic paraspinals + moist heat -Grade II-IV A/P mobs to mid thoracic spine with inadvertent cavitation at T6  Therex: -prescribed initial HEP  PATIENT EDUCATION: Education details: FAQ on [employer] handout, initial HEP Person educated: Patient Education method: Explanation, Demonstration, and Handouts Education comprehension: verbalized understanding and needs further education  HOME EXERCISE PROGRAM: Access Code: [email or web address] URL: [email or web address] Date: 04/11/2023 Prepared by: Merry Lofty  Exercises - Seated Rhomboid Stretch  - 1 x daily - 7 x weekly - 3 sets - 30s hold - Seated Mid Back Stretch  - 1 x daily - 7 x weekly - 3 sets - 30s hold - Seated Levator Scapulae Stretch  - 1 x daily - 7 x weekly - 3 sets - 30s hold - Seated Thoracic Self-Mobilization  - 1 x daily - 7 x weekly - 3 sets - 10 reps - 3s hold  GOALS: Goals reviewed with patient? Yes  SHORT TERM GOALS: = LTG based on PT POC  LONG TERM GOALS: Target date: 05/02/23  Pt will be independent with final HEP for improved symptom report  Baseline: to be provided Goal status: INITIAL  2.  Patient will report an average pain of </= 6/10 over the course of a week to demonstrate reduction in symptoms Baseline: 8/10 Goal status: INITIAL  3.  Patient will improve Oswestry score to </=10/50 to demonstrate improved function related to her back pain Baseline: 19/50 Goal status: REVISED   ASSESSMENT:  CLINICAL IMPRESSION: Patient seen for skilled PT session with emphasis on muscle tension release and stretching. Tolerated manual therapy well with subjective minimized pain experience. Remains with limited thoracic mobility, likely contributing to her reoccurrence of her pain and trigger point. Continue POC.    OBJECTIVE IMPAIRMENTS: decreased knowledge of condition, decreased mobility, decreased ROM,  decreased strength, increased fascial restrictions, increased muscle spasms, impaired sensation, impaired tone, impaired UE functional use, postural dysfunction, and pain.   ACTIVITY LIMITATIONS: carrying, lifting, sleeping, reach over head, and hygiene/grooming  PARTICIPATION LIMITATIONS: interpersonal relationship, driving, shopping, community activity, occupation, and yard work  PERSONAL FACTORS: Past/current experiences, Profession, and Time since onset of injury/illness/exacerbation are also affecting patient's functional outcome.   REHAB POTENTIAL: Good  CLINICAL DECISION MAKING: Stable/uncomplicated  EVALUATION COMPLEXITY: Moderate  PLAN:  PT FREQUENCY: 1-2x/week  PT DURATION: 4 weeks  PLANNED INTERVENTIONS: 97164- PT Re-evaluation, 97110-Therapeutic exercises, 97530- Therapeutic activity, 97112- Neuromuscular re-education, 97535- Self Care, 81191- Manual therapy, [phone]- Gait training, [phone]- Aquatic Therapy, [phone]- Electrical stimulation (manual), Patient/Family education, Balance training, Stair training, Taping, Dry Needling, Joint mobilization, Joint manipulation, Spinal manipulation, Spinal mobilization, Vestibular training, Visual/preceptual remediation/compensation, DME instructions, Cryotherapy, and Moist heat  PLAN FOR NEXT SESSION:  TPDN, thoracic mobility (over foam roller?)   Westley Foots, PT Westley Foots, PT, DPT, CBIS  04/11/2023, 9:45 AM

## 2023-04-17 ENCOUNTER — Ambulatory Visit: Attending: Neurology | Admitting: Physical Therapy

## 2023-04-17 DIAGNOSIS — R293 Abnormal posture: Secondary | ICD-10-CM | POA: Diagnosis present

## 2023-04-17 DIAGNOSIS — M6281 Muscle weakness (generalized): Secondary | ICD-10-CM

## 2023-04-17 NOTE — Therapy (Signed)
 OUTPATIENT PHYSICAL THERAPY NEURO TREATMENT   Patient Name: Alyzah Pelly MRN: [id number] DOB:May 22, 1960, 63 y.o., female Today's Date: 04/17/2023   PCP: Jerre Simon, MD REFERRING PROVIDER: Nita Sickle, DO  END OF SESSION:  PT End of Session - 04/17/23 0844     Visit Number 3    Number of Visits 9    Date for PT Re-Evaluation 05/02/23    Authorization Type UHC    PT Start Time 0844    PT Stop Time 0917   pt with some pain following DN   PT Time Calculation (min) 33 min    Activity Tolerance Patient limited by pain    Behavior During Therapy [facility] for tasks assessed/performed              Past Medical History:  Diagnosis Date   Anemia    Anxiety    At increased risk for cardiovascular disease 08/29/2020   Breast cancer (HCC)    Cancer (HCC) 10/2018   left breast IDC   Chest pain 05/18/2012   DM type 2 (diabetes mellitus, type 2) (HCC) 05/18/2012   GERD (gastroesophageal reflux disease)    OTC prn   Hypertension    Pain in paraspinal region 03/30/2021   Personal history of chemotherapy    Personal history of radiation therapy    Past Surgical History:  Procedure Laterality Date   BREAST LUMPECTOMY WITH RADIOACTIVE SEED AND SENTINEL LYMPH NODE BIOPSY Left 04/21/2019   Procedure: LEFT BREAST LUMPECTOMY WITH RADIOACTIVE SEED AND SENTINEL LYMPH NODE MAPPING;  Surgeon: Harriette Bouillon, MD;  Location: [facility];  Service: General;  Laterality: Left;   CESAREAN SECTION     PORTACATH PLACEMENT Right 11/11/2018   Procedure: INSERTION PORT-A-CATH WITH ULTRASOUND;  Surgeon: Harriette Bouillon, MD;  Location: [facility];  Service: General;  Laterality: Right;   utreine polyp     resected   Patient Active Problem List   Diagnosis Date Noted   Mononeuropathy 04/26/2022   Acute pain of right knee 04/01/2022   Left-sided chest pain 11/05/2021   Grief 11/05/2021   Odynophagia 07/31/2021   Parotitis 07/16/2021   Neck swelling 07/10/2021    Right forearm pain 06/04/2021   Postherpetic neuralgia 05/08/2021   Globus pharyngeus 03/08/2021   Primary open angle glaucoma 08/29/2020   Health care maintenance 08/29/2020   Port-A-Cath in place 12/23/2018   Malignant neoplasm of upper-inner quadrant of left breast in female, estrogen receptor negative (HCC) 10/26/2018   Vitamin D deficiency 12/12/2017   Primary osteoarthritis of left knee 12/12/2017   Mild anemia 05/18/2012   DM type 2 (diabetes mellitus, type 2) (HCC) 05/18/2012   Essential hypertension 04/06/2007   KELOID 04/06/2007    ONSET DATE: 03/25/23 referral  REFERRING DIAG:  R20.0 (ICD-10-CM) - Numbness of left foot  M54.6 (ICD-10-CM) - Acute right-sided thoracic back pain    THERAPY DIAG:  Abnormal posture  Muscle weakness (generalized)  Rationale for Evaluation and Treatment: Rehabilitation  SUBJECTIVE:  SUBJECTIVE STATEMENT:  "[name]"   Pt reports that her pain has slightly improved, feels like one piece of glass sticking out instead of feeling like glass everywhere. She reports she hasn't had to take Aleve since Jenn "rubbed on my back". Pt does feel like the pain has started moving towards her R arm but overall she just feels pain where it originally started on her shoulder blade deep down as well as some on the outside of her R shoulder blade. Pt also reports that every now and then it tingles and goes all over the body but does not last long.  Pt accompanied by: self  PERTINENT HISTORY: anemia, anxiety, breast CA s/p chemo/radiation, DMII, GERD, HTN  PAIN:  Are you having pain? Yes: NPRS scale: 6/10 Pain location: R inferior angle of scapula Pain description: tingling, burning, deep to the bone, "just feels like glass cutting into my skin" Aggravating factors: "it's just  there" Relieving factors: aleve, biofreeze, vicks vaporub   PRECAUTIONS: None  PATIENT GOALS: to not be in pain  OBJECTIVE:  Note: Objective measures were completed at Evaluation unless otherwise noted.  DIAGNOSTIC FINDINGS: 11/05/21 T-spine xray IMPRESSION: No acute fracture or dislocation. Scoliosis of spine. No significant degenerative joint changes noted.                                                                                                                             TREATMENT  TherAct Trigger Point Dry Needling  Initial Treatment: Pt instructed on Dry Needling rational, procedures, and possible side effects. Pt instructed to expect mild to moderate muscle soreness later in the day and/or into the next day.  Pt instructed in methods to reduce muscle soreness. Pt instructed to continue prescribed HEP. Because Dry Needling was performed over or adjacent to a lung field, pt was educated on S/S of pneumothorax and to seek immediate medical attention should they occur.  Patient was educated on signs and symptoms of infection and other risk factors and advised to seek medical attention should they occur.  Patient verbalized understanding of these instructions and education.   Patient Verbal Consent Given: Yes Education Handout Provided: Yes Muscles Treated: R rhomboids and R middle trap Electrical Stimulation Performed: No Treatment Response/Outcome: deep ache/pressure; muscle twitch detected  Trigger Point Dry Needling  What is Trigger Point Dry Needling (DN)? DN is a physical therapy technique used to treat muscle pain and dysfunction. Specifically, DN helps deactivate muscle trigger points (muscle knots).  A thin filiform needle is used to penetrate the skin and stimulate the underlying trigger point. The goal is for a local twitch response (LTR) to occur and for the trigger point to relax. No medication of any kind is injected during the procedure.   What Does  Trigger Point Dry Needling Feel Like?  The procedure feels different for each individual patient. Some patients report that they do not actually feel the needle enter the skin and overall the process is  not painful. Very mild bleeding may occur. However, many patients feel a deep cramping in the muscle in which the needle was inserted. This is the local twitch response.   How Will I feel after the treatment? Soreness is normal, and the onset of soreness may not occur for a few hours. Typically this soreness does not last longer than two days.  Bruising is uncommon, however; ice can be used to decrease any possible bruising.  In rare cases feeling tired or nauseous after the treatment is normal. In addition, your symptoms may get worse before they get better, this period will typically not last longer than 24 hours.   What Can I do After My Treatment? Increase your hydration by drinking more water for the next 24 hours.  You may place ice or heat on the areas treated that have become sore, however, do not use heat on inflamed or bruised areas. Heat often brings more relief post needling. You can continue your regular activities, but vigorous activity is not recommended initially after the treatment for 24 hours. DN is best combined with other physical therapy such as strengthening, stretching, and other therapies.   What are the complications? While your therapist has had extensive training in minimizing the risks of trigger point dry needling, it is important to understand the risks of any procedure.  Risks include bleeding, pain, fatigue, hematoma, infection, vertigo, nausea or nerve involvement. Monitor for any changes to your skin or sensation. Contact your therapist or MD with concerns.  A rare but serious complication is a pneumothorax over or near your middle and upper chest and back If you have dry needling in this area, monitor for the following symptoms: Shortness of breath on exertion  and/or Difficulty taking a deep breath and/or Chest Pain and/or A dry cough If any of the above symptoms develop, please go to the nearest emergency room or call 911. Tell them you had dry needling over your thorax and report any symptoms you are having. Please follow-up with your treating therapist after you complete the medical evaluation.     TherEx Following TPDN to address pain and tightness in rhomboids and lats: Thread the needle at countertop 3 x 30 sec each B R lat stretch at wall 3 x 30 sec  Added to HEP, see bolded below    PATIENT EDUCATION: Education details: TPDN, continue HEP and added to HEP Person educated: Patient Education method: Explanation, Demonstration, and Handouts Education comprehension: verbalized understanding and needs further education  HOME EXERCISE PROGRAM: Access Code: [email or web address] URL: [email or web address] Date: 04/11/2023 Prepared by: Merry Lofty  Exercises - Seated Rhomboid Stretch  - 1 x daily - 7 x weekly - 3 sets - 30s hold - Seated Mid Back Stretch  - 1 x daily - 7 x weekly - 3 sets - 30s hold - Seated Levator Scapulae Stretch  - 1 x daily - 7 x weekly - 3 sets - 30s hold - Seated Thoracic Self-Mobilization  - 1 x daily - 7 x weekly - 3 sets - 10 reps - 3s hold - Plank with Thoracic Rotation on Counter  - 1 x daily - 7 x weekly - 1 sets - 3-5 reps - 30 sec hold - Latissimus Dorsi Stretch at Wall  - 1 x daily - 7 x weekly - 1 sets - 3-5 reps - 30 sec hold  GOALS: Goals reviewed with patient? Yes  SHORT TERM GOALS: = LTG based on PT POC  LONG TERM GOALS: Target  date: 05/02/23  Pt will be independent with final HEP for improved symptom report  Baseline: to be provided Goal status: INITIAL  2.  Patient will report an average pain of </= 6/10 over the course of a week to demonstrate reduction in symptoms Baseline: 8/10 Goal status: INITIAL  3.  Patient will improve Oswestry score to </=10/50 to demonstrate improved  function related to her back pain Baseline: 19/50 Goal status: REVISED   ASSESSMENT:  CLINICAL IMPRESSION: Emphasis of skilled PT session on initiating TPDN to address trigger points around R shoulder blade. Pt with good response to DN this date with decrease in palpable size of trigger point following treatment. Pt does have ongoing trigger points in this area but can better assess response next session. She does have soreness in the area following DN so deferred further needling or manual therapy to that region as well. Followed DN with several stretches to address muscle tightness. Pt continues to benefit from skilled PT services to work towards LTGs. Continue POC.   OBJECTIVE IMPAIRMENTS: decreased knowledge of condition, decreased mobility, decreased ROM, decreased strength, increased fascial restrictions, increased muscle spasms, impaired sensation, impaired tone, impaired UE functional use, postural dysfunction, and pain.   ACTIVITY LIMITATIONS: carrying, lifting, sleeping, reach over head, and hygiene/grooming  PARTICIPATION LIMITATIONS: interpersonal relationship, driving, shopping, community activity, occupation, and yard work  PERSONAL FACTORS: Past/current experiences, Profession, and Time since onset of injury/illness/exacerbation are also affecting patient's functional outcome.   REHAB POTENTIAL: Good  CLINICAL DECISION MAKING: Stable/uncomplicated  EVALUATION COMPLEXITY: Moderate  PLAN:  PT FREQUENCY: 1-2x/week  PT DURATION: 4 weeks  PLANNED INTERVENTIONS: 97164- PT Re-evaluation, 97110-Therapeutic exercises, 97530- Therapeutic activity, 97112- Neuromuscular re-education, 97535- Self Care, 53664- Manual therapy, [phone]- Gait training, [phone]- Aquatic Therapy, [phone]- Electrical stimulation (manual), Patient/Family education, Balance training, Stair training, Taping, Dry Needling, Joint mobilization, Joint manipulation, Spinal manipulation, Spinal mobilization, Vestibular  training, Visual/preceptual remediation/compensation, DME instructions, Cryotherapy, and Moist heat  PLAN FOR NEXT SESSION: how was response to TPDN? DN again around R shoulder blade and in lats, thoracic mobility (over foam roller?)   Peter Congo, PT Peter Congo, PT, DPT, CSRS   04/17/2023, 9:19 AM

## 2023-04-21 ENCOUNTER — Ambulatory Visit (HOSPITAL_COMMUNITY)
Admission: RE | Admit: 2023-04-21 | Discharge: 2023-04-21 | Disposition: A | Discharge status: Home / Self Care | Source: Ambulatory Visit | Attending: Adult Health | Admitting: Adult Health

## 2023-04-21 DIAGNOSIS — G589 Mononeuropathy, unspecified: Secondary | ICD-10-CM | POA: Diagnosis present

## 2023-04-21 DIAGNOSIS — Z171 Estrogen receptor negative status [ER-]: Secondary | ICD-10-CM | POA: Insufficient documentation

## 2023-04-21 DIAGNOSIS — C50212 Malignant neoplasm of upper-inner quadrant of left female breast: Secondary | ICD-10-CM | POA: Diagnosis present

## 2023-04-21 MED ORDER — GADOBUTROL 1 MMOL/ML IV SOLN
10.0000 mL | Freq: Once | INTRAVENOUS | Status: AC | PRN
  Administered 2023-04-21: 10 mL via INTRAVENOUS

## 2023-04-22 ENCOUNTER — Ambulatory Visit: Admitting: Physical Therapy

## 2023-04-22 DIAGNOSIS — R293 Abnormal posture: Secondary | ICD-10-CM

## 2023-04-22 DIAGNOSIS — M6281 Muscle weakness (generalized): Secondary | ICD-10-CM

## 2023-04-22 NOTE — Therapy (Signed)
 OUTPATIENT PHYSICAL THERAPY NEURO TREATMENT   Patient Name: Erin Lawson MRN: [id number] DOB:1960/07/30, 63 y.o., female Today's Date: 04/22/2023   PCP: Jerre Simon, MD REFERRING PROVIDER: Nita Sickle, DO  END OF SESSION:  PT End of Session - 04/22/23 0934     Visit Number 4    Number of Visits 9    Date for PT Re-Evaluation 05/02/23    Authorization Type UHC    PT Start Time 0932    PT Stop Time 1017    PT Time Calculation (min) 45 min    Activity Tolerance Patient tolerated treatment well    Behavior During Therapy [facility] for tasks assessed/performed               Past Medical History:  Diagnosis Date   Anemia    Anxiety    At increased risk for cardiovascular disease 08/29/2020   Breast cancer (HCC)    Cancer (HCC) 10/2018   left breast IDC   Chest pain 05/18/2012   DM type 2 (diabetes mellitus, type 2) (HCC) 05/18/2012   GERD (gastroesophageal reflux disease)    OTC prn   Hypertension    Pain in paraspinal region 03/30/2021   Personal history of chemotherapy    Personal history of radiation therapy    Past Surgical History:  Procedure Laterality Date   BREAST LUMPECTOMY WITH RADIOACTIVE SEED AND SENTINEL LYMPH NODE BIOPSY Left 04/21/2019   Procedure: LEFT BREAST LUMPECTOMY WITH RADIOACTIVE SEED AND SENTINEL LYMPH NODE MAPPING;  Surgeon: Harriette Bouillon, MD;  Location: [facility];  Service: General;  Laterality: Left;   CESAREAN SECTION     PORTACATH PLACEMENT Right 11/11/2018   Procedure: INSERTION PORT-A-CATH WITH ULTRASOUND;  Surgeon: Harriette Bouillon, MD;  Location: [facility];  Service: General;  Laterality: Right;   utreine polyp     resected   Patient Active Problem List   Diagnosis Date Noted   Mononeuropathy 04/26/2022   Acute pain of right knee 04/01/2022   Left-sided chest pain 11/05/2021   Grief 11/05/2021   Odynophagia 07/31/2021   Parotitis 07/16/2021   Neck swelling 07/10/2021   Right forearm pain  06/04/2021   Postherpetic neuralgia 05/08/2021   Globus pharyngeus 03/08/2021   Primary open angle glaucoma 08/29/2020   Health care maintenance 08/29/2020   Port-A-Cath in place 12/23/2018   Malignant neoplasm of upper-inner quadrant of left breast in female, estrogen receptor negative (HCC) 10/26/2018   Vitamin D deficiency 12/12/2017   Primary osteoarthritis of left knee 12/12/2017   Mild anemia 05/18/2012   DM type 2 (diabetes mellitus, type 2) (HCC) 05/18/2012   Essential hypertension 04/06/2007   KELOID 04/06/2007    ONSET DATE: 03/25/23 referral  REFERRING DIAG:  R20.0 (ICD-10-CM) - Numbness of left foot  M54.6 (ICD-10-CM) - Acute right-sided thoracic back pain    THERAPY DIAG:  Abnormal posture  Muscle weakness (generalized)  Rationale for Evaluation and Treatment: Rehabilitation  SUBJECTIVE:  SUBJECTIVE STATEMENT:  "Erin Lawson"   Pt reports that initially her pain increased to 9/10 around her R shoulder blade after DN last visit and even Aleve did not help. However, it calmed down after a few days and is now around 4-5/10. Pt reports that her pain is now more of a throbbing pain instead of the glass pain as well as she is having occasional feeling of "pins" sticking everywhere - this will show up unexpectedly and feels like "spears". Pt with some tenderness in the knot on the back of her R shoulder with more pain around outside of R shoulder blade today, very tender to the touch.  Pt reports that after her pain subsided from the needling last week she noticed improved mobility in her shoulders, is able to lift her arms up overhead without having pain like she was previously.  Pt accompanied by: self  PERTINENT HISTORY: anemia, anxiety, breast CA s/p chemo/radiation, DMII, GERD, HTN  PAIN:   Are you having pain? Yes: NPRS scale: 4-5/10 Pain location: R inferior angle of scapula, R lats Pain description: tingling, burning, deep to the bone, "just feels like glass cutting into my skin" Aggravating factors: "it's just there" Relieving factors: aleve, biofreeze, vicks vaporub   PRECAUTIONS: None  PATIENT GOALS: to not be in pain  OBJECTIVE:  Note: Objective measures were completed at Evaluation unless otherwise noted.  DIAGNOSTIC FINDINGS: 11/05/21 T-spine xray IMPRESSION: No acute fracture or dislocation. Scoliosis of spine. No significant degenerative joint changes noted.                                                                                                                             TREATMENT  TherAct Trigger Point Dry Needling  Subsequent Treatment: Instructions provided previously at initial dry needling treatment.   Patient Verbal Consent Given: Yes Education Handout Provided: Yes Muscles Treated: R rhomboids Electrical Stimulation Performed: No Treatment Response/Outcome: deep ache/pressure; muscle twitch detected    Manual Therapy Pt's trigger point in her R rhomboids is decreased in size from previous session but is still palpable. Pt agreeable to DN to this area again, less intense reaction this time as compared to previous session, see above. Also palpated her R lats and teres minor/major lateral to her R scapula, pt very tender to the touch in this area. Deferred DN of lats due to sensitivity of this area this date. Performed STM to her R lats and teres minor/major to tolerance, decreased tenderness in area following treatment.  Pt also able to trial use of theracane to address ongoing tight muscles around her R scapula, provided handout for where to purchase if patient interested.    PATIENT EDUCATION: Education details: TPDN, continue HEP, theracane Person educated: Patient Education method: Explanation, Demonstration, and  Handouts Education comprehension: verbalized understanding and needs further education  HOME EXERCISE PROGRAM: Access Code: [email or web address] URL: [email or web address] Date: 04/11/2023 Prepared by: Merry Lofty  Exercises - Seated Rhomboid Stretch  - 1 x  daily - 7 x weekly - 3 sets - 30s hold - Seated Mid Back Stretch  - 1 x daily - 7 x weekly - 3 sets - 30s hold - Seated Levator Scapulae Stretch  - 1 x daily - 7 x weekly - 3 sets - 30s hold - Seated Thoracic Self-Mobilization  - 1 x daily - 7 x weekly - 3 sets - 10 reps - 3s hold - Plank with Thoracic Rotation on Counter  - 1 x daily - 7 x weekly - 1 sets - 3-5 reps - 30 sec hold - Latissimus Dorsi Stretch at Wall  - 1 x daily - 7 x weekly - 1 sets - 3-5 reps - 30 sec hold  GOALS: Goals reviewed with patient? Yes  SHORT TERM GOALS: = LTG based on PT POC  LONG TERM GOALS: Target date: 05/02/23  Pt will be independent with final HEP for improved symptom report  Baseline: to be provided Goal status: INITIAL  2.  Patient will report an average pain of </= 6/10 over the course of a week to demonstrate reduction in symptoms Baseline: 8/10 Goal status: INITIAL  3.  Patient will improve Oswestry score to </=10/50 to demonstrate improved function related to her back pain Baseline: 19/50 Goal status: REVISED   ASSESSMENT:  CLINICAL IMPRESSION: Emphasis of skilled PT session on performing TPDN again to address trigger points around R shoulder blade. Pt did have a very intense response to DN last visit but overall felt that her symptoms were improved so she is agreeable to try again this visit. Pt with improved response to DN in her R rhomboids this date, deferred treating her lats or teres muscles until next session with this therapist due to increased sensitivity in those muscles. Pt does have a reduced in sensitivity of her R lats and teres muscles following manual therapy. Pt continues to benefit from skilled PT services to  work towards LTGs. Continue POC.   OBJECTIVE IMPAIRMENTS: decreased knowledge of condition, decreased mobility, decreased ROM, decreased strength, increased fascial restrictions, increased muscle spasms, impaired sensation, impaired tone, impaired UE functional use, postural dysfunction, and pain.   ACTIVITY LIMITATIONS: carrying, lifting, sleeping, reach over head, and hygiene/grooming  PARTICIPATION LIMITATIONS: interpersonal relationship, driving, shopping, community activity, occupation, and yard work  PERSONAL FACTORS: Past/current experiences, Profession, and Time since onset of injury/illness/exacerbation are also affecting patient's functional outcome.   REHAB POTENTIAL: Good  CLINICAL DECISION MAKING: Stable/uncomplicated  EVALUATION COMPLEXITY: Moderate  PLAN:  PT FREQUENCY: 1-2x/week  PT DURATION: 4 weeks  PLANNED INTERVENTIONS: 97164- PT Re-evaluation, 97110-Therapeutic exercises, 97530- Therapeutic activity, 97112- Neuromuscular re-education, 97535- Self Care, 40981- Manual therapy, [phone]- Gait training, [phone]- Aquatic Therapy, [phone]- Electrical stimulation (manual), Patient/Family education, Balance training, Stair training, Taping, Dry Needling, Joint mobilization, Joint manipulation, Spinal manipulation, Spinal mobilization, Vestibular training, Visual/preceptual remediation/compensation, DME instructions, Cryotherapy, and Moist heat  PLAN FOR NEXT SESSION: how was response to TPDN the 2nd time? Still having tenderness lateral to R shoulder blade? DN again around R shoulder blade and in lats, thoracic mobility (over foam roller?), did she get a theracane?   Peter Congo, PT Peter Congo, PT, DPT, CSRS   04/22/2023, 10:19 AM

## 2023-04-25 ENCOUNTER — Ambulatory Visit

## 2023-04-25 DIAGNOSIS — M6281 Muscle weakness (generalized): Secondary | ICD-10-CM

## 2023-04-25 DIAGNOSIS — R293 Abnormal posture: Secondary | ICD-10-CM | POA: Diagnosis not present

## 2023-04-25 NOTE — Therapy (Signed)
 OUTPATIENT PHYSICAL THERAPY NEURO TREATMENT   Patient Name: Erin Lawson MRN: [id number] DOB:08-Nov-1960, 63 y.o., female Today's Date: 04/25/2023   PCP: Jerre Simon, MD REFERRING PROVIDER: Nita Sickle, DO  END OF SESSION:  PT End of Session - 04/25/23 0846     Visit Number 5    Number of Visits 9    Date for PT Re-Evaluation 05/02/23    Authorization Type UHC    PT Start Time 0845    PT Stop Time 0926    PT Time Calculation (min) 41 min    Activity Tolerance Patient tolerated treatment well    Behavior During Therapy [facility] for tasks assessed/performed               Past Medical History:  Diagnosis Date   Anemia    Anxiety    At increased risk for cardiovascular disease 08/29/2020   Breast cancer (HCC)    Cancer (HCC) 10/2018   left breast IDC   Chest pain 05/18/2012   DM type 2 (diabetes mellitus, type 2) (HCC) 05/18/2012   GERD (gastroesophageal reflux disease)    OTC prn   Hypertension    Pain in paraspinal region 03/30/2021   Personal history of chemotherapy    Personal history of radiation therapy    Past Surgical History:  Procedure Laterality Date   BREAST LUMPECTOMY WITH RADIOACTIVE SEED AND SENTINEL LYMPH NODE BIOPSY Left 04/21/2019   Procedure: LEFT BREAST LUMPECTOMY WITH RADIOACTIVE SEED AND SENTINEL LYMPH NODE MAPPING;  Surgeon: Harriette Bouillon, MD;  Location: [facility];  Service: General;  Laterality: Left;   CESAREAN SECTION     PORTACATH PLACEMENT Right 11/11/2018   Procedure: INSERTION PORT-A-CATH WITH ULTRASOUND;  Surgeon: Harriette Bouillon, MD;  Location: [facility];  Service: General;  Laterality: Right;   utreine polyp     resected   Patient Active Problem List   Diagnosis Date Noted   Mononeuropathy 04/26/2022   Acute pain of right knee 04/01/2022   Left-sided chest pain 11/05/2021   Grief 11/05/2021   Odynophagia 07/31/2021   Parotitis 07/16/2021   Neck swelling 07/10/2021   Right forearm pain  06/04/2021   Postherpetic neuralgia 05/08/2021   Globus pharyngeus 03/08/2021   Primary open angle glaucoma 08/29/2020   Health care maintenance 08/29/2020   Port-A-Cath in place 12/23/2018   Malignant neoplasm of upper-inner quadrant of left breast in female, estrogen receptor negative (HCC) 10/26/2018   Vitamin D deficiency 12/12/2017   Primary osteoarthritis of left knee 12/12/2017   Mild anemia 05/18/2012   DM type 2 (diabetes mellitus, type 2) (HCC) 05/18/2012   Essential hypertension 04/06/2007   KELOID 04/06/2007    ONSET DATE: 03/25/23 referral  REFERRING DIAG:  R20.0 (ICD-10-CM) - Numbness of left foot  M54.6 (ICD-10-CM) - Acute right-sided thoracic back pain    THERAPY DIAG:  Abnormal posture  Muscle weakness (generalized)  Rationale for Evaluation and Treatment: Rehabilitation  SUBJECTIVE:  SUBJECTIVE STATEMENT:  "[name]"   Patient reports doing well. Initially was feeling better, but feels as though pain has shifted to more of her axilla- area under her R arm. Denies falls.   Pt accompanied by: self  PERTINENT HISTORY: anemia, anxiety, breast CA s/p chemo/radiation, DMII, GERD, HTN  PAIN:  Are you having pain? Yes: NPRS scale: 4/10 Pain location: R inferior angle of scapula, R lats Pain description: tingling, burning, deep to the bone, "just feels like glass cutting into my skin" Aggravating factors: "it's just there" Relieving factors: aleve, biofreeze, vicks vaporub   PRECAUTIONS: None  PATIENT GOALS: to not be in pain  OBJECTIVE:  Note: Objective measures were completed at Evaluation unless otherwise noted.  DIAGNOSTIC FINDINGS: 11/05/21 T-spine xray IMPRESSION: No acute fracture or dislocation. Scoliosis of spine. No significant degenerative joint changes  noted.                                                                                                                             TREATMENT  Manual: -MFR to R paraspinals and upper trap -MFR to R supraspinatus  -moist heat added  -theracane   Self care/ home management: -discussed shoulder and upper back anatomy as it pertains to her pain/symptoms    PATIENT EDUCATION: Education details: continue HEP, see above Person educated: Patient Education method: Explanation, Demonstration, and Handouts Education comprehension: verbalized understanding and needs further education  HOME EXERCISE PROGRAM: Access Code: [email or web address] URL: [email or web address] Date: 04/11/2023 Prepared by: Merry Lofty  Exercises - Seated Rhomboid Stretch  - 1 x daily - 7 x weekly - 3 sets - 30s hold - Seated Mid Back Stretch  - 1 x daily - 7 x weekly - 3 sets - 30s hold - Seated Levator Scapulae Stretch  - 1 x daily - 7 x weekly - 3 sets - 30s hold - Seated Thoracic Self-Mobilization  - 1 x daily - 7 x weekly - 3 sets - 10 reps - 3s hold - Plank with Thoracic Rotation on Counter  - 1 x daily - 7 x weekly - 1 sets - 3-5 reps - 30 sec hold - Latissimus Dorsi Stretch at Wall  - 1 x daily - 7 x weekly - 1 sets - 3-5 reps - 30 sec hold  GOALS: Goals reviewed with patient? Yes  SHORT TERM GOALS: = LTG based on PT POC  LONG TERM GOALS: Target date: 05/02/23  Pt will be independent with final HEP for improved symptom report  Baseline: to be provided Goal status: INITIAL  2.  Patient will report an average pain of </= 6/10 over the course of a week to demonstrate reduction in symptoms Baseline: 8/10 Goal status: INITIAL  3.  Patient will improve Oswestry score to </=10/50 to demonstrate improved function related to her back pain Baseline: 19/50 Goal status: REVISED   ASSESSMENT:  CLINICAL IMPRESSION: Patient seen for skilled PT session with emphasis on trigger point release and pain  management. She continues to have a palpable, active trigger point in her R upper trap/paraspinals and R infraspinatus. PT able to reduce these minimally with prolonged pressure and other modalities, though they persist. Continue POC.    OBJECTIVE IMPAIRMENTS: decreased knowledge of condition, decreased mobility, decreased ROM, decreased strength, increased fascial restrictions, increased muscle spasms, impaired sensation, impaired tone, impaired UE functional use, postural dysfunction, and pain.   ACTIVITY LIMITATIONS: carrying, lifting, sleeping, reach over head, and hygiene/grooming  PARTICIPATION LIMITATIONS: interpersonal relationship, driving, shopping, community activity, occupation, and yard work  PERSONAL FACTORS: Past/current experiences, Profession, and Time since onset of injury/illness/exacerbation are also affecting patient's functional outcome.   REHAB POTENTIAL: Good  CLINICAL DECISION MAKING: Stable/uncomplicated  EVALUATION COMPLEXITY: Moderate  PLAN:  PT FREQUENCY: 1-2x/week  PT DURATION: 4 weeks  PLANNED INTERVENTIONS: 97164- PT Re-evaluation, 97110-Therapeutic exercises, 97530- Therapeutic activity, 97112- Neuromuscular re-education, 97535- Self Care, 40981- Manual therapy, [phone]- Gait training, [phone]- Aquatic Therapy, [phone]- Electrical stimulation (manual), Patient/Family education, Balance training, Stair training, Taping, Dry Needling, Joint mobilization, Joint manipulation, Spinal manipulation, Spinal mobilization, Vestibular training, Visual/preceptual remediation/compensation, DME instructions, Cryotherapy, and Moist heat  PLAN FOR NEXT SESSION: how was response to TPDN the 2nd time? Still having tenderness lateral to R shoulder blade? DN again around R shoulder blade and in lats, thoracic mobility (over foam roller?), did she get a theracane?   Westley Foots, PT Westley Foots, PT, DPT, CBIS  04/25/2023, 9:49 AM

## 2023-04-28 ENCOUNTER — Ambulatory Visit: Admitting: Physical Therapy

## 2023-04-28 DIAGNOSIS — M6281 Muscle weakness (generalized): Secondary | ICD-10-CM

## 2023-04-28 DIAGNOSIS — R293 Abnormal posture: Secondary | ICD-10-CM | POA: Diagnosis not present

## 2023-04-28 NOTE — Therapy (Signed)
 OUTPATIENT PHYSICAL THERAPY NEURO TREATMENT   Patient Name: Erin Lawson MRN: [id number] DOB:September 27, 1960, 63 y.o., female Today's Date: 04/28/2023   PCP: Goble Last, MD REFERRING PROVIDER: Reyna Cava, DO  END OF SESSION:  PT End of Session - 04/28/23 0845     Visit Number 6    Number of Visits 9    Date for PT Re-Evaluation 05/02/23    Authorization Type UHC    PT Start Time 0843    PT Stop Time 0927    PT Time Calculation (min) 44 min    Activity Tolerance Patient tolerated treatment well    Behavior During Therapy [facility] for tasks assessed/performed                Past Medical History:  Diagnosis Date   Anemia    Anxiety    At increased risk for cardiovascular disease 08/29/2020   Breast cancer (HCC)    Cancer (HCC) 10/2018   left breast IDC   Chest pain 05/18/2012   DM type 2 (diabetes mellitus, type 2) (HCC) 05/18/2012   GERD (gastroesophageal reflux disease)    OTC prn   Hypertension    Pain in paraspinal region 03/30/2021   Personal history of chemotherapy    Personal history of radiation therapy    Past Surgical History:  Procedure Laterality Date   BREAST LUMPECTOMY WITH RADIOACTIVE SEED AND SENTINEL LYMPH NODE BIOPSY Left 04/21/2019   Procedure: LEFT BREAST LUMPECTOMY WITH RADIOACTIVE SEED AND SENTINEL LYMPH NODE MAPPING;  Surgeon: Sim Dryer, MD;  Location: [facility];  Service: General;  Laterality: Left;   CESAREAN SECTION     PORTACATH PLACEMENT Right 11/11/2018   Procedure: INSERTION PORT-A-CATH WITH ULTRASOUND;  Surgeon: Sim Dryer, MD;  Location: [facility];  Service: General;  Laterality: Right;   utreine polyp     resected   Patient Active Problem List   Diagnosis Date Noted   Mononeuropathy 04/26/2022   Acute pain of right knee 04/01/2022   Left-sided chest pain 11/05/2021   Grief 11/05/2021   Odynophagia 07/31/2021   Parotitis 07/16/2021   Neck swelling 07/10/2021   Right forearm pain  06/04/2021   Postherpetic neuralgia 05/08/2021   Globus pharyngeus 03/08/2021   Primary open angle glaucoma 08/29/2020   Health care maintenance 08/29/2020   Port-A-Cath in place 12/23/2018   Malignant neoplasm of upper-inner quadrant of left breast in female, estrogen receptor negative (HCC) 10/26/2018   Vitamin D deficiency 12/12/2017   Primary osteoarthritis of left knee 12/12/2017   Mild anemia 05/18/2012   DM type 2 (diabetes mellitus, type 2) (HCC) 05/18/2012   Essential hypertension 04/06/2007   KELOID 04/06/2007    ONSET DATE: 03/25/23 referral  REFERRING DIAG:  R20.0 (ICD-10-CM) - Numbness of left foot  M54.6 (ICD-10-CM) - Acute right-sided thoracic back pain    THERAPY DIAG:  Abnormal posture  Muscle weakness (generalized)  Rationale for Evaluation and Treatment: Rehabilitation  SUBJECTIVE:  SUBJECTIVE STATEMENT:  "Erin Lawson"   Pt reports that her pain is doing worse, shot back up to 8/10 over the weekend but has calmed back down to about 4/10 today. Pt did have to take some pain medicine over the weekend due to pain. Pt felt "glass-like" pain again over the weekend. Pt feels like the pain has moved again back to original spot in the back of her shoulder blade, no longer feeling it in axilla region.  Pt reports that the pain is improved after second round of dry needling but that the first time of manual therapy was good, second time she hurt over the weekend.  Pt reports that any little movement makes the pain worse; can only carry things on her R side due to breast cancer on L side; so R side can be getting overworked. Pt reports that she was told to avoid lifting heavy things with her L side and avoid taking BP on that side (lymph node removal and tumor removal on this side).  Pt  accompanied by: self  PERTINENT HISTORY: anemia, anxiety, breast CA s/p chemo/radiation, DMII, GERD, HTN  PAIN:  Are you having pain? Yes: NPRS scale: 4/10 Pain location: R inferior angle of scapula, R lats Pain description: tingling, burning, deep to the bone, "just feels like glass cutting into my skin" Aggravating factors: "it's just there" Relieving factors: aleve, biofreeze, vicks vaporub   PRECAUTIONS: None  PATIENT GOALS: to not be in pain  OBJECTIVE:  Note: Objective measures were completed at Evaluation unless otherwise noted.  DIAGNOSTIC FINDINGS: 11/05/21 T-spine xray IMPRESSION: No acute fracture or dislocation. Scoliosis of spine. No significant degenerative joint changes noted.                                                                                                                             TREATMENT    TherAct Trigger Point Dry Needling  Subsequent Treatment: Instructions provided previously at initial dry needling treatment.   Patient Verbal Consent Given: Yes Education Handout Provided: Previously Provided Muscles Treated: R rhomboids and R infraspinatus  Electrical Stimulation Performed: No Treatment Response/Outcome: deep ache/pressure; muscle twitch detected; decrease in size of palpable trigger point though ongoing trigger points in R rhomboids  TherEx Supine B shoulder abduction over towel roll for thoracic mobilization x 10 reps Supine B shoulder flexion over towel roll for thoracic mobilization x 10 reps  Standing wall angels x 10 reps Tennis ball mobilization against wall x 5 min  Added appropriate exercises to HEP, see bolded below    PATIENT EDUCATION: Education details: continue HEP and added to HEP Person educated: Patient Education method: Programmer, multimedia, Facilities manager, and Handouts Education comprehension: verbalized understanding and needs further education  HOME EXERCISE PROGRAM: Access Code: [email or web address] URL:  [email or web address] Date: 04/11/2023 Prepared by: Nickola Baron  Exercises - Seated Rhomboid Stretch  - 1 x daily - 7 x weekly - 3 sets - 30s hold - Seated Mid Back Stretch  -  1 x daily - 7 x weekly - 3 sets - 30s hold - Seated Levator Scapulae Stretch  - 1 x daily - 7 x weekly - 3 sets - 30s hold - Seated Thoracic Self-Mobilization  - 1 x daily - 7 x weekly - 3 sets - 10 reps - 3s hold - Plank with Thoracic Rotation on Counter  - 1 x daily - 7 x weekly - 1 sets - 3-5 reps - 30 sec hold - Latissimus Dorsi Stretch at Wall  - 1 x daily - 7 x weekly - 1 sets - 3-5 reps - 30 sec hold - Wall Angels  - 1 x daily - 7 x weekly - 3 sets - 10 reps - Supine Thoracic Mobilization Foam Roll Horizontal with Arm Stretch  - 1 x daily - 7 x weekly - 3 sets - 10 reps  Trigger point mobilization/release with tennis ball against wall  GOALS: Goals reviewed with patient? Yes  SHORT TERM GOALS: = LTG based on PT POC  LONG TERM GOALS: Target date: 05/02/23  Pt will be independent with final HEP for improved symptom report  Baseline: to be provided Goal status: INITIAL  2.  Patient will report an average pain of </= 6/10 over the course of a week to demonstrate reduction in symptoms Baseline: 8/10 Goal status: INITIAL  3.  Patient will improve Oswestry score to </=10/50 to demonstrate improved function related to her back pain Baseline: 19/50 Goal status: REVISED   ASSESSMENT:  CLINICAL IMPRESSION: Emphasis of skilled PT session on performing TPDN followed by stretching and use of tennis ball to address ongoing trigger points and muscle tightness around R shoulder blade. As patient was told to avoid use of her LUE following her lymph node removal following her breast cancer treatment she has been relying on just her RUE for all lifting and activity, likely leading to trigger points and her current pain. Pt does continue to exhibit muscle tightness and trigger points in her R rhomboids  though decreased from initial treatment. Pt can benefit from ongoing skilled PT services to address ongoing trigger points in order to reduce pain and then work towards strengthening in order to improve her function. Continue POC.    OBJECTIVE IMPAIRMENTS: decreased knowledge of condition, decreased mobility, decreased ROM, decreased strength, increased fascial restrictions, increased muscle spasms, impaired sensation, impaired tone, impaired UE functional use, postural dysfunction, and pain.   ACTIVITY LIMITATIONS: carrying, lifting, sleeping, reach over head, and hygiene/grooming  PARTICIPATION LIMITATIONS: interpersonal relationship, driving, shopping, community activity, occupation, and yard work  PERSONAL FACTORS: Past/current experiences, Profession, and Time since onset of injury/illness/exacerbation are also affecting patient's functional outcome.   REHAB POTENTIAL: Good  CLINICAL DECISION MAKING: Stable/uncomplicated  EVALUATION COMPLEXITY: Moderate  PLAN:  PT FREQUENCY: 1-2x/week  PT DURATION: 4 weeks  PLANNED INTERVENTIONS: 97164- PT Re-evaluation, 97110-Therapeutic exercises, 97530- Therapeutic activity, 97112- Neuromuscular re-education, 97535- Self Care, 91478- Manual therapy, [phone]- Gait training, [phone]- Aquatic Therapy, [phone]- Electrical stimulation (manual), Patient/Family education, Balance training, Stair training, Taping, Dry Needling, Joint mobilization, Joint manipulation, Spinal manipulation, Spinal mobilization, Vestibular training, Visual/preceptual remediation/compensation, DME instructions, Cryotherapy, and Moist heat  PLAN FOR NEXT SESSION: how was response to TPDN the 3nd time? Still having tenderness lateral to R shoulder blade? DN again around R shoulder blade and in lats, did she get a theracane? - then work towards strengthening  Does she want to d/c or add visits?   Peter Congo, PT Peter Congo, PT, DPT, CSRS  04/28/2023, 9:28  AM

## 2023-04-29 ENCOUNTER — Telehealth: Payer: Self-pay

## 2023-04-29 NOTE — Telephone Encounter (Signed)
 Called her back and given below message. She verbalized understanding. PT is helping and she is planning to take a break after the 4/17 appt to see how it goes. Sent Alwin Baars, NP a message.

## 2023-04-29 NOTE — Telephone Encounter (Signed)
-----   Message from Conway Dennis sent at 04/28/2023  3:10 PM EDT ----- Please let patient know that MRI of the spine shows no sign of cancer recurrence.  She has follow-up with Dr. Gudena next month.  Is she going to physical therapy and is it helping? ----- Message ----- From: Interface, Rad Results In Sent: 04/28/2023   1:46 PM EDT To: Percival Brace, NP

## 2023-04-29 NOTE — Telephone Encounter (Signed)
 Called and left a message asking her to call the office back.

## 2023-05-01 ENCOUNTER — Ambulatory Visit: Admitting: Physical Therapy

## 2023-05-01 ENCOUNTER — Ambulatory Visit: Admission: RE | Admit: 2023-05-01 | Source: Ambulatory Visit

## 2023-05-01 ENCOUNTER — Ambulatory Visit

## 2023-05-01 ENCOUNTER — Ambulatory Visit (INDEPENDENT_AMBULATORY_CARE_PROVIDER_SITE_OTHER): Admitting: Neurology

## 2023-05-01 DIAGNOSIS — R293 Abnormal posture: Secondary | ICD-10-CM | POA: Diagnosis not present

## 2023-05-01 DIAGNOSIS — R200 Anesthesia of skin: Secondary | ICD-10-CM

## 2023-05-01 DIAGNOSIS — M546 Pain in thoracic spine: Secondary | ICD-10-CM

## 2023-05-01 DIAGNOSIS — M6281 Muscle weakness (generalized): Secondary | ICD-10-CM

## 2023-05-01 NOTE — Procedures (Signed)
 [facility]  [address] [place], [address]  [place], [place] [address] Tel: [phone] Fax:  [phone] Test Date:  05/01/2023  Patient: Erin Lawson DOB: Oct 17, 1960 Physician: Corbin Dess Judy Pollman,DO  Sex: Female Height: 5\' 1"  Ref Phys: Reyna Cava, DO  ID#: [id number]   Technician:    Patient Complaints: This is a 63 year old female referred for evaluation of left foot numbness.  NCV & EMG Findings: Extensive electrodiagnostic testing of the left lower extremity shows: Left sural and superficial peroneal sensory responses are within normal limits. Left peroneal and tibial motor responses are within normal limits. There is no evidence of active or chronic motor axonal loss changes affecting any of the tested muscles.  Motor unit configuration and recruitment pattern is within normal limits.  Impression: This is a normal study of the left lower extremity.  In particular, there is no evidence of a lumbosacral radiculopathy or large fiber sensorimotor polyneuropathy.   ___________________________ Reyna Cava    Nerve Conduction Studies Anti Sensory Summary Table   Stim Site NR Peak (ms) Norm Peak (ms) O-P Amp (V) Norm O-P Amp  Left Sup Peroneal Anti Sensory (Ant Lat Mall)  32C  12 cm    2.4 <4.6 17.2 >3  Left Sural Anti Sensory (Lat Mall)  32C  Calf    2.8 <4.6 18.8 >3   Motor Summary Table   Stim Site NR Onset (ms) Norm Onset (ms) O-P Amp (mV) Norm O-P Amp Site1 Site2 Delta-0 (ms) Dist (cm) Vel (m/s) Norm Vel (m/s)  Left Peroneal Motor (Ext Dig Brev)  32C  Ankle    4.1 <6.0 6.4 >2.5 B Fib Ankle 7.5 33.0 44 >40  B Fib    11.6  5.7  Poplt B Fib 1.4 7.0 50 >40  Poplt    13.0  5.7         Left Tibial Motor (Abd Hall Brev)  32C  Ankle    3.8 <6.0 6.0 >4 Knee Ankle 9.0 40.0 44 >40  Knee    12.8  5.7          EMG   Side Muscle Ins Act Fibs Fasc Recrt Dur. Amp. Poly. Activation Comment  Left AntTibialis Nml Nml Nml Nml Nml Nml Nml Nml N/A  Left Gastroc Nml  Nml Nml Nml Nml Nml Nml Nml N/A  Left RectFemoris Nml Nml Nml Nml Nml Nml Nml Nml N/A  Left GluteusMed Nml Nml Nml Nml Nml Nml Nml Nml N/A  Left Flex Dig Long Nml Nml Nml Nml Nml Nml Nml Nml N/A      Waveforms:

## 2023-05-01 NOTE — Therapy (Signed)
 OUTPATIENT PHYSICAL THERAPY NEURO TREATMENT - DISCHARGE NOTE   Patient Name: Erin Lawson MRN: [id number] DOB:1960/10/19, 63 y.o., female Today's Date: 05/01/2023   PCP: Jerre Simon, MD REFERRING PROVIDER: Nita Sickle, DO  PHYSICAL THERAPY DISCHARGE SUMMARY  Visits from Start of Care: 7  Current functional level related to goals / functional outcomes: Independent   Remaining deficits: Ongoing nerve symptoms around R shoulder blade, ongoing trigger point along medial border of R scapula   Education / Equipment: Final handout for HEP   Patient agrees to discharge. Patient goals were met. Patient is being discharged due to being pleased with the current functional level.   END OF SESSION:  PT End of Session - 05/01/23 1059     Visit Number 7    Number of Visits 9    Date for PT Re-Evaluation 05/02/23    Authorization Type UHC    PT Start Time 1057    PT Stop Time 1111   d/c   PT Time Calculation (min) 14 min    Activity Tolerance Patient tolerated treatment well    Behavior During Therapy WFL for tasks assessed/performed                 Past Medical History:  Diagnosis Date   Anemia    Anxiety    At increased risk for cardiovascular disease 08/29/2020   Breast cancer (HCC)    Cancer (HCC) 10/2018   left breast IDC   Chest pain 05/18/2012   DM type 2 (diabetes mellitus, type 2) (HCC) 05/18/2012   GERD (gastroesophageal reflux disease)    OTC prn   Hypertension    Pain in paraspinal region 03/30/2021   Personal history of chemotherapy    Personal history of radiation therapy    Past Surgical History:  Procedure Laterality Date   BREAST LUMPECTOMY WITH RADIOACTIVE SEED AND SENTINEL LYMPH NODE BIOPSY Left 04/21/2019   Procedure: LEFT BREAST LUMPECTOMY WITH RADIOACTIVE SEED AND SENTINEL LYMPH NODE MAPPING;  Surgeon: Harriette Bouillon, MD;  Location: [facility];  Service: General;  Laterality: Left;   CESAREAN SECTION     PORTACATH  PLACEMENT Right 11/11/2018   Procedure: INSERTION PORT-A-CATH WITH ULTRASOUND;  Surgeon: Harriette Bouillon, MD;  Location: [facility];  Service: General;  Laterality: Right;   utreine polyp     resected   Patient Active Problem List   Diagnosis Date Noted   Mononeuropathy 04/26/2022   Acute pain of right knee 04/01/2022   Left-sided chest pain 11/05/2021   Grief 11/05/2021   Odynophagia 07/31/2021   Parotitis 07/16/2021   Neck swelling 07/10/2021   Right forearm pain 06/04/2021   Postherpetic neuralgia 05/08/2021   Globus pharyngeus 03/08/2021   Primary open angle glaucoma 08/29/2020   Health care maintenance 08/29/2020   Port-A-Cath in place 12/23/2018   Malignant neoplasm of upper-inner quadrant of left breast in female, estrogen receptor negative (HCC) 10/26/2018   Vitamin D deficiency 12/12/2017   Primary osteoarthritis of left knee 12/12/2017   Mild anemia 05/18/2012   DM type 2 (diabetes mellitus, type 2) (HCC) 05/18/2012   Essential hypertension 04/06/2007   KELOID 04/06/2007    ONSET DATE: 03/25/23 referral  REFERRING DIAG:  R20.0 (ICD-10-CM) - Numbness of left foot  M54.6 (ICD-10-CM) - Acute right-sided thoracic back pain    THERAPY DIAG:  Abnormal posture  Muscle weakness (generalized)  Rationale for Evaluation and Treatment: Rehabilitation  SUBJECTIVE:  SUBJECTIVE STATEMENT:  "Erin Lawson"   Pt reports that after needling last visit she had itching around her R shoulder blade area for a whole day. Yesterday and today she has been having more of a burning sensation. Pt wanting to take a break from PT after her visit today.  Pt also had the EMG in her legs this morning with Dr. Lydia Sams, will find out results in about 7 days.  Pt accompanied by: self  PERTINENT  HISTORY: anemia, anxiety, breast CA s/p chemo/radiation, DMII, GERD, HTN  PAIN:  Are you having pain? Yes: NPRS scale: 3/10 Pain location: R inferior angle of scapula, R lats Pain description: tingling, burning, deep to the bone, "just feels like glass cutting into my skin" Aggravating factors: "it's just there" Relieving factors: aleve, biofreeze, vicks vaporub   PRECAUTIONS: None  PATIENT GOALS: to not be in pain  OBJECTIVE:  Note: Objective measures were completed at Evaluation unless otherwise noted.  DIAGNOSTIC FINDINGS: 11/05/21 T-spine xray IMPRESSION: No acute fracture or dislocation. Scoliosis of spine. No significant degenerative joint changes noted.                                                                                                                             TREATMENT    TherAct For LTG assessment: Oswestry: 5/50  Discussed patient's symptoms around her R shoulder blade of itching and burning, educated her that this is likely a nerve pain and to reach out to her physician for medication to address nerve pain.     PATIENT EDUCATION: Education details: continue HEP, results of goal check and PT POC with plan to d/c this visit Person educated: Patient Education method: Explanation Education comprehension: verbalized understanding  HOME EXERCISE PROGRAM: Access Code: [email or web address] URL: [email or web address] Date: 04/11/2023 Prepared by: Nickola Baron  Exercises - Seated Rhomboid Stretch  - 1 x daily - 7 x weekly - 3 sets - 30s hold - Seated Mid Back Stretch  - 1 x daily - 7 x weekly - 3 sets - 30s hold - Seated Levator Scapulae Stretch  - 1 x daily - 7 x weekly - 3 sets - 30s hold - Seated Thoracic Self-Mobilization  - 1 x daily - 7 x weekly - 3 sets - 10 reps - 3s hold - Plank with Thoracic Rotation on Counter  - 1 x daily - 7 x weekly - 1 sets - 3-5 reps - 30 sec hold - Latissimus Dorsi Stretch at Wall  - 1 x daily - 7 x weekly - 1  sets - 3-5 reps - 30 sec hold - Wall Angels  - 1 x daily - 7 x weekly - 3 sets - 10 reps - Supine Thoracic Mobilization Foam Roll Horizontal with Arm Stretch  - 1 x daily - 7 x weekly - 3 sets - 10 reps  Trigger point mobilization/release with tennis ball against wall  GOALS: Goals reviewed with patient? Yes  SHORT TERM GOALS: = LTG  based on PT POC  LONG TERM GOALS: Target date: 05/02/23  Pt will be independent with final HEP for improved symptom report  Baseline: to be provided Goal status: MET  2.  Patient will report an average pain of </= 6/10 over the course of a week to demonstrate reduction in symptoms Baseline: 8/10, 3/10 (4/17) Goal status: MET  3.  Patient will improve Oswestry score to </=10/50 to demonstrate improved function related to her back pain Baseline: 19/50, 5/50 (4/17) Goal status: MET   ASSESSMENT:  CLINICAL IMPRESSION: Emphasis of skilled PT session on assessing LTG in preparation for d/c from OPPT services this date. Pt has met 3/3 LTG due to being independent with her final HEP, improving her pain overall to 3/10, and improving her score on the Oswestry from 19/50 at initial eval to 5/50 this date, demonstrating decreased pain and improved function. Pt does continue to have a palpable trigger point along medial border of her R shoulder blade as well as pain and nerve symptoms in this area. She could benefit from more PT in the future for periscapular and postural strengthening but she is wanting to take a break from PT for right now. Also encouraged patient to reach out to her referring provider regarding nerve pain/symptoms around her R shoulder blade.   OBJECTIVE IMPAIRMENTS: decreased knowledge of condition, decreased mobility, decreased ROM, decreased strength, increased fascial restrictions, increased muscle spasms, impaired sensation, impaired tone, impaired UE functional use, postural dysfunction, and pain.   ACTIVITY LIMITATIONS: carrying, lifting,  sleeping, reach over head, and hygiene/grooming  PARTICIPATION LIMITATIONS: interpersonal relationship, driving, shopping, community activity, occupation, and yard work  PERSONAL FACTORS: Past/current experiences, Profession, and Time since onset of injury/illness/exacerbation are also affecting patient's functional outcome.   REHAB POTENTIAL: Good  CLINICAL DECISION MAKING: Stable/uncomplicated  EVALUATION COMPLEXITY: Moderate  PLAN: Discharge from PT   Lorita Rosa, PT Lorita Rosa, PT, DPT, CSRS   05/01/2023, 11:12 AM
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
## 2023-06-04 ENCOUNTER — Inpatient Hospital Stay: Payer: 59 | Attending: Adult Health | Admitting: Hematology and Oncology

## 2023-06-04 VITALS — BP 163/62 | HR 71 | Temp 97.9°F | Resp 18 | Ht 61.0 in | Wt 140.1 lb

## 2023-06-04 DIAGNOSIS — C50212 Malignant neoplasm of upper-inner quadrant of left female breast: Secondary | ICD-10-CM | POA: Insufficient documentation

## 2023-06-04 DIAGNOSIS — Z9221 Personal history of antineoplastic chemotherapy: Secondary | ICD-10-CM | POA: Diagnosis not present

## 2023-06-04 DIAGNOSIS — Z923 Personal history of irradiation: Secondary | ICD-10-CM | POA: Insufficient documentation

## 2023-06-04 DIAGNOSIS — Z1722 Progesterone receptor negative status: Secondary | ICD-10-CM | POA: Diagnosis not present

## 2023-06-04 DIAGNOSIS — Z1732 Human epidermal growth factor receptor 2 negative status: Secondary | ICD-10-CM | POA: Insufficient documentation

## 2023-06-04 DIAGNOSIS — Z171 Estrogen receptor negative status [ER-]: Secondary | ICD-10-CM | POA: Diagnosis not present
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
## 2023-07-15 ENCOUNTER — Ambulatory Visit

## 2023-07-15 ENCOUNTER — Ambulatory Visit
Admission: RE | Admit: 2023-07-15 | Discharge: 2023-07-15 | Disposition: A | Discharge status: Home / Self Care | Source: Ambulatory Visit | Attending: Adult Health | Admitting: Adult Health

## 2023-07-15 DIAGNOSIS — Z171 Estrogen receptor negative status [ER-]: Secondary | ICD-10-CM

## 2023-07-23 ENCOUNTER — Ambulatory Visit: Admitting: Student

## 2023-08-01 ENCOUNTER — Encounter: Payer: Self-pay | Admitting: Advanced Practice Midwife

## 2023-08-08 ENCOUNTER — Ambulatory Visit: Payer: Self-pay

## 2023-08-08 NOTE — Telephone Encounter (Addendum)
 FYI Only or Action Required?: FYI only for provider.  Patient was last seen in primary care on 02/28/2023 by Rosendo Rush, MD.  Called Nurse Triage reporting Insect Bite.  Symptoms began about a month ago.  Interventions attempted: Other: Pulled stinger out.  Symptoms are: gradually worsening.  Triage Disposition: See PCP When Office is Open (Within 3 Days)  Patient/caregiver understands and will follow disposition?: Yes Copied from CRM #[id number]. Topic: Clinical - Red Word Triage >> Aug 08, 2023 11:54 AM Adrianna P wrote: Red Word that prompted transfer to Nurse Triage: Bit by insect one month ago. She states it is very painful, the stinger is still under the skin on her back Reason for Disposition  [1] After 14 days AND [2] insect bite isn't healed  Answer Assessment - Initial Assessment Questions 1. TYPE of INSECT: What type of insect was it?      Unsure of what type of insect it was, but a piece of the stinger is still under skin  2. ONSET: When did you get bitten?      Approx 1 month ago  3. LOCATION: Where is the insect bite located?      Right upper back  4. REDNESS: Is the area red or pink? If Yes, ask: What size is the area of redness? (inches or cm). When did the redness start?     Mild redness, better after removing stinger  5. PAIN: Is there any pain? If Yes, ask: How bad is the pain? (Scale 0-10; or none, mild, moderate, severe)     Moderate- pain has moved from left upper back to right upper, now to under the right breast  6. ITCHING: Does it itch? If Yes, ask: How bad is the itch?      Moderate itching  7. SWELLING: How big is the swelling? (e.g., inches, cm, or compare to coins)     There was swelling, but went to therapy  8. OTHER SYMPTOMS: Do you have any other symptoms?  (e.g., difficulty breathing, fever, hives)     No  9. PREGNANCY: Is there any chance you are pregnant? When was your last menstrual period?      No  Protocols used: Insect Bite-A-AH

## 2023-08-11 ENCOUNTER — Ambulatory Visit: Payer: Self-pay | Admitting: Student

## 2023-08-11 VITALS — BP 185/83 | HR 54 | Ht 61.0 in | Wt 141.4 lb

## 2023-08-11 DIAGNOSIS — Z7984 Long term (current) use of oral hypoglycemic drugs: Secondary | ICD-10-CM

## 2023-08-11 DIAGNOSIS — M546 Pain in thoracic spine: Secondary | ICD-10-CM | POA: Diagnosis not present

## 2023-08-11 DIAGNOSIS — E1165 Type 2 diabetes mellitus with hyperglycemia: Secondary | ICD-10-CM | POA: Diagnosis not present

## 2023-08-11 DIAGNOSIS — E119 Type 2 diabetes mellitus without complications: Secondary | ICD-10-CM

## 2023-08-11 DIAGNOSIS — M26609 Unspecified temporomandibular joint disorder, unspecified side: Secondary | ICD-10-CM | POA: Diagnosis not present

## 2023-08-11 DIAGNOSIS — I10 Essential (primary) hypertension: Secondary | ICD-10-CM | POA: Diagnosis not present

## 2023-08-11 DIAGNOSIS — G8929 Other chronic pain: Secondary | ICD-10-CM

## 2023-08-11 MED ORDER — CYCLOBENZAPRINE HCL 10 MG PO TABS: 10.0000 mg | ORAL_TABLET | Freq: Every day | ORAL | 0 refills | Status: DC

## 2023-08-11 NOTE — Assessment & Plan Note (Signed)
 With hyperglycemia. Comorbid hypertension and mixed hypercholesterolemia. She takes metformin  1,000 mg daily. Check A1c and explore resistance to medication at follow-up.

## 2023-08-11 NOTE — Telephone Encounter (Signed)
 Patient called she stated she seen Dr.Everhart for a 1 time visit. Patient stated she is still our patient and would like to keep her appointment that is scheduled for this afternoon.

## 2023-08-11 NOTE — Progress Notes (Signed)
 Patient name: Erin Lawson Date of birth: 04/23/1960 Date of visit: 08/11/23  Subjective   Chief concern: a lot of pain in my back  Burning pain over right scapula since February 2025. She thinks she was bitten or stung by a bug of some kind around that time. She says a friend actually scraped some part of a bug out of her skin there at some point. Since February the pain has abated somewhat. Mononeuropathy has been considered, as have metastatic spinal lesions, the latter having been ruled out by MRI.  She also has ear pain which she thinks is caused by a bug that crawled in her ear at some point. She has been taking a lot of Advil  for these problems.  Medical history notable for breast cancer status post chemotherapy, lumpectomy, and adjuvant radiation. She has hypertension and diabetes.  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 Her shifts are 16 hours long and she works 2-3 shifts per week, usually overnight. Her job is very stressful.  Review of Systems  Respiratory:  Negative for shortness of breath.   Cardiovascular:  Negative for chest pain.  Neurological:  Negative for headaches.    Current Outpatient Medications  Medication Instructions   Accu-Chek Softclix Lancets lancets Check blood sugar 3 times a day   acetaminophen  (TYLENOL  8 HOUR) 650 mg, Oral, Every 8 hours PRN   blood glucose meter kit and supplies Dispense based on patient and insurance preference. Use up to four times daily as directed. (FOR ICD-10 E10.9, E11.9).   cetirizine  (ZYRTEC  ALLERGY) 10 mg, Oral, Daily   Continuous Blood Gluc Sensor (FREESTYLE LIBRE 2 SENSOR) MISC Use to check blood sugar at least 6 times a day before and 2 hours after meals   diclofenac  Sodium (VOLTAREN ) 4 g, Topical, 2 times daily PRN   glucose blood (ACCU-CHEK GUIDE) test strip Check blood sugar 3 times per day   latanoprost (XALATAN) 0.005 % ophthalmic solution 1 drop, Daily   metFORMIN  (GLUCOPHAGE -XR) 1,000 mg, Oral, Daily  with breakfast   methocarbamol  (ROBAXIN ) 500 mg, Oral, 2 times daily     Objective  Today's Vitals   08/11/23 1317 08/11/23 1322 08/11/23 1411  BP: (!) 221/92 (!) 201/82 (!) 185/83  Pulse: 67 69 (!) 54  Weight: 141 lb 6.4 oz (64.1 kg)    Height: 5' 1 (1.549 m)    Body mass index is 26.72 kg/m.   Physical Exam Constitutional:      General: She is not in acute distress.    Appearance: Normal appearance.  HENT:     Head:     Jaw: Tenderness (right-sided) present.     Right Ear: Tympanic membrane, ear canal and external ear normal.     Left Ear: Tympanic membrane, ear canal and external ear normal.     Mouth/Throat:     Mouth: Mucous membranes are moist.     Pharynx: No oropharyngeal exudate or posterior oropharyngeal erythema.  Eyes:     Extraocular Movements: Extraocular movements intact.     Conjunctiva/sclera: Conjunctivae normal.     Pupils: Pupils are equal, round, and reactive to light.  Neck:     Thyroid: No thyroid mass, thyromegaly or thyroid tenderness.     Vascular: No carotid bruit.  Cardiovascular:     Rate and Rhythm: Normal rate and regular rhythm.     Pulses: Normal pulses.     Heart sounds: No murmur heard. Pulmonary:     Effort: Pulmonary effort is normal.  Breath sounds: Normal breath sounds. No wheezing or rales.  Musculoskeletal:     Right lower leg: No edema.     Left lower leg: No edema.  Lymphadenopathy:     Cervical: No cervical adenopathy.  Skin:    General: Skin is warm and dry.  Neurological:     Mental Status: She is alert. Mental status is at baseline.     Cranial Nerves: No facial asymmetry.     Motor: No tremor.     Deep Tendon Reflexes:     Reflex Scores:      Bicep reflexes are 1+ on the right side and 1+ on the left side.      Patellar reflexes are 1+ on the right side and 1+ on the left side. Psychiatric:        Mood and Affect: Mood normal.        Behavior: Behavior normal.      Assessment & Plan  Problem List Items  Addressed This Visit     Essential hypertension (Chronic)   Marked asymptomatic hypertension today. She's not on blood pressure medication. She's hesitant to start because of perceived widespread recalls of blood pressure medication. With comorbid diabetes and hyperglycemia her risk for poor health outcome is high. I expressed my concern about risks of her untreated hypertension. We'll follow-up after a short interval during which she will consider blood pressure lowering therapy. BMP today.      Relevant Orders   Basic metabolic panel with GFR   Chronic right-sided thoracic back pain (Chronic)   Mononeuropathy versus localized small-fiber neuropathy. Perhaps musculoskeletal. No sign of metastatic breast cancer by MRI recently. Unusual is her perseveration about bugs as a cause of her symptoms. Going to try a course of cyclobenzaprine  for 2 weeks for this and TMJ dysfunction.      Relevant Medications   cyclobenzaprine  (FLEXERIL ) 10 MG tablet   DM type 2 (diabetes mellitus, type 2) (HCC)   With hyperglycemia. Comorbid hypertension and mixed hypercholesterolemia. She takes metformin  1,000 mg daily. Check A1c and explore resistance to medication at follow-up.      Relevant Orders   Hemoglobin A1c   Microalbumin / creatinine urine ratio   TMJ dysfunction - Primary   Ear pain with tenderness over right TMJ. She has been taking a lot of advil  for this and her back pain of late, which may be contributing to her hypertension today. I don't think NSAIDs are a safe choice for her right now, discontinuing in favor of Tylenol  and cyclobenzaprine .      Relevant Medications   cyclobenzaprine  (FLEXERIL ) 10 MG tablet   Return in about 4 weeks (around 09/08/2023) for hypertension and diabetes.  Ozell Kung MD 08/11/2023, 9:08 PM

## 2023-08-11 NOTE — Assessment & Plan Note (Signed)
 Ear pain with tenderness over right TMJ. She has been taking a lot of advil  for this and her back pain of late, which may be contributing to her hypertension today. I don't think NSAIDs are a safe choice for her right now, discontinuing in favor of Tylenol  and cyclobenzaprine .

## 2023-08-11 NOTE — Assessment & Plan Note (Signed)
 Marked asymptomatic hypertension today. She's not on blood pressure medication. She's hesitant to start because of perceived widespread recalls of blood pressure medication. With comorbid diabetes and hyperglycemia her risk for poor health outcome is high. I expressed my concern about risks of her untreated hypertension. We'll follow-up after a short interval during which she will consider blood pressure lowering therapy. BMP today.

## 2023-08-11 NOTE — Patient Instructions (Signed)
 Return in about 4 weeks (around 09/08/2023) for hypertension and diabetes.  Remember to bring all of the medications that you take (including over the counter medications and supplements) with you to every clinic visit.  This after visit summary is an important review of tests, referrals, and medication changes that were discussed during your visit. If you have questions or concerns, call [phone]. Outside of clinic business hours, call the main hospital at [phone] and ask the operator for the on-call internal medicine resident.   Ozell Kung MD 08/11/2023, 1:59 PM

## 2023-08-11 NOTE — Assessment & Plan Note (Signed)
 Mononeuropathy versus localized small-fiber neuropathy. Perhaps musculoskeletal. No sign of metastatic breast cancer by MRI recently. Unusual is her perseveration about bugs as a cause of her symptoms. Going to try a course of cyclobenzaprine  for 2 weeks for this and TMJ dysfunction.

## 2023-08-12 ENCOUNTER — Ambulatory Visit: Payer: Self-pay | Admitting: Student

## 2023-08-12 LAB — BASIC METABOLIC PANEL WITH GFR
BUN/Creatinine Ratio: 16 (ref 12–28)
BUN: 10 mg/dL (ref 8–27)
CO2: 20 mmol/L (ref 20–29)
Calcium: 10 mg/dL (ref 8.7–10.3)
Chloride: 102 mmol/L (ref 96–106)
Creatinine, Ser: 0.64 mg/dL (ref 0.57–1.00)
Glucose: 282 mg/dL — ABNORMAL HIGH (ref 70–99)
Potassium: 4.2 mmol/L (ref 3.5–5.2)
Sodium: 138 mmol/L (ref 134–144)
eGFR: 99 mL/min/1.73 (ref >59)

## 2023-08-12 LAB — MICROALBUMIN / CREATININE URINE RATIO
Creatinine, Urine: 61.4 mg/dL
Microalb/Creat Ratio: 24 mg/g{creat} (ref 0–29)
Microalbumin, Urine: 14.5 ug/mL

## 2023-08-12 LAB — HEMOGLOBIN A1C
Est. average glucose Bld gHb Est-mCnc: 278 mg/dL
Hgb A1c MFr Bld: 11.3 % — ABNORMAL HIGH (ref 4.8–5.6)

## 2023-08-13 NOTE — Progress Notes (Signed)
 Internal Medicine Clinic Attending  Case discussed with the resident at the time of the visit.  We reviewed the resident's history and exam and pertinent patient test results.  I agree with the assessment, diagnosis, and plan of care documented in the resident's note.

## 2023-09-01 ENCOUNTER — Telehealth: Payer: Self-pay | Admitting: *Deleted

## 2023-09-01 ENCOUNTER — Other Ambulatory Visit: Payer: Self-pay

## 2023-09-01 ENCOUNTER — Ambulatory Visit

## 2023-09-01 ENCOUNTER — Ambulatory Visit: Payer: Self-pay

## 2023-09-01 VITALS — BP 162/72 | HR 79 | Temp 97.7°F | Ht 61.0 in | Wt 142.2 lb

## 2023-09-01 DIAGNOSIS — G5711 Meralgia paresthetica, right lower limb: Secondary | ICD-10-CM | POA: Diagnosis not present

## 2023-09-01 DIAGNOSIS — Z7984 Long term (current) use of oral hypoglycemic drugs: Secondary | ICD-10-CM

## 2023-09-01 DIAGNOSIS — E1165 Type 2 diabetes mellitus with hyperglycemia: Secondary | ICD-10-CM | POA: Diagnosis not present

## 2023-09-01 DIAGNOSIS — I10 Essential (primary) hypertension: Secondary | ICD-10-CM

## 2023-09-01 MED ORDER — CELECOXIB 100 MG PO CAPS: 100.0000 mg | ORAL_CAPSULE | Freq: Every day | ORAL | 0 refills | Status: DC

## 2023-09-01 NOTE — Telephone Encounter (Signed)
 Call from pt Seen today today in [facility] by Dr Napoleon Was given work excuse letter but pt does not want her diagnosis on letter Asking for a new letter and will pick up from front desk tomorrow..Erin Gasparyan Cassady8/18/20253:24 PM

## 2023-09-01 NOTE — Assessment & Plan Note (Signed)
 Seen by Dr. Norrine on 7/28 at which time her A1c was 11.3.  Previous A1c 9.7 in April of last year.  She believes that this is due to eating more grab and go foods.  She is currently taking metformin  1000 mg daily and reports good adherence to this.  After extensive counseling on medications, she declined any further medication management as she believes that she will be able to lower her A1c with lifestyle changes alone.  She was given advice about lifestyle changes including increased activity, decrease fried foods, increasing her vegetable and fruit intake, and eating more fiber.  She was advised that if her A1c does not improve in 3 months that she should start an additional medication to her metformin , and that ideally she should start an additional agent today.  -Patient declined medication management -Lifestyle modifications as above - Follow-up in 3 months for A1c

## 2023-09-01 NOTE — Telephone Encounter (Signed)
 FYI Only or Action Required?: FYI only for provider.  Patient was last seen in primary care on 08/11/2023 by Norrine Sharper, MD.  Called Nurse Triage reporting Pain.  Symptoms began several weeks ago.  Interventions attempted: OTC medications: motrin  and Rest, hydration, or home remedies.  Symptoms are: unchanged.  Triage Disposition: See HCP Within 4 Hours (Or PCP Triage)  Patient/caregiver understands and will follow disposition?:   Copied from CRM [id number]. Topic: Clinical - Red Word Triage >> Sep 01, 2023  8:54 AM Carmell SAUNDERS wrote: Red Word that prompted transfer to Nurse Triage: Right side pain in stomach and around back. 8/10. Says she came in 3 weeks ago and was only told to take tylenol  and its hasn't helped and she wants to see another doctor, not Dr. Mclendon. Reason for Disposition  [1] MILD-MODERATE pain AND [2] constant AND [3] present > 2 hours  Answer Assessment - Initial Assessment Questions 1. LOCATION: Where does it hurt?      Right sided hip 2. RADIATION: Does the pain shoot anywhere else? (e.g., chest, back)     To right lower abdomen 3. ONSET: When did the pain begin? (e.g., minutes, hours or days ago)      Several weeks 4. SUDDEN: Gradual or sudden onset?     gradual 5. PATTERN Does the pain come and go, or is it constant?     Constant 6. SEVERITY: How bad is the pain?  (e.g., Scale 1-10; mild, moderate, or severe)     As high as 8/10, Motrin  will dull pain for up to 3 hours.  7. RECURRENT SYMPTOM: Have you ever had this type of stomach pain before? If Yes, ask: When was the last time? and What happened that time?      Yes, several weeks ago. No treatments.  8. CAUSE: What do you think is causing the stomach pain? (e.g., gallstones, recent abdominal surgery)     unsure 9. RELIEVING/AGGRAVATING FACTORS: What makes it better or worse? (e.g., antacids, bending or twisting motion, bowel movement)     denies 10. OTHER SYMPTOMS: Do you  have any other symptoms? (e.g., back pain, diarrhea, fever, urination pain, vomiting)       Straining for BM  Protocols used: Abdominal Pain - [facility]

## 2023-09-01 NOTE — Patient Instructions (Addendum)
 Thank you, [name], for allowing us  to provide your care today. Today we discussed . . .  > Hip pain       - I have prescribed Celebrex  100mg  once a day. You can take Tylenol  with this medication but avoid the Ibuprofen  > Diabetes       - Your A1C is very high at 11.3. We discussed lifestyle changes today. Please keep taking Metformin  1000mg  once daily. If your A1C is still elevated in 3 months, we would need to start medications. > Hypertension       - Your blood pressure is still above goal. Please continue to measure your BP at home and make lifestyle changes.   Follow up: 3 months    Remember:  Should you have any questions or concerns please call the internal medicine clinic at [phone].     [name], [facility]

## 2023-09-01 NOTE — Progress Notes (Signed)
 CC: Acute Concern of right hip pain  HPI:  Erin Lawson is a 63 y.o. female with pertinent PMH of HTN, DMII, breast cancer in remission who presents for chief concern of right hip pain. Please see problem based assessment and plan for further history.  Review of Systems  Constitutional:  Negative for chills and fever.  Eyes:  Negative for blurred vision.  Respiratory:  Negative for shortness of breath.   Cardiovascular:  Negative for chest pain.  Neurological:  Positive for headaches.    Medications: Current Outpatient Medications  Medication Instructions   Accu-Chek Softclix Lancets lancets Check blood sugar 3 times a day   acetaminophen  (TYLENOL  8 HOUR) 650 mg, Oral, Every 8 hours PRN   blood glucose meter kit and supplies Dispense based on patient and insurance preference. Use up to four times daily as directed. (FOR ICD-10 E10.9, E11.9).   cetirizine  (ZYRTEC  ALLERGY) 10 mg, Oral, Daily   Continuous Blood Gluc Sensor (FREESTYLE LIBRE 2 SENSOR) MISC Use to check blood sugar at least 6 times a day before and 2 hours after meals   cyclobenzaprine  (FLEXERIL ) 10 mg, Oral, Daily at bedtime   diclofenac  Sodium (VOLTAREN ) 4 g, Topical, 2 times daily PRN   glucose blood (ACCU-CHEK GUIDE) test strip Check blood sugar 3 times per day   latanoprost (XALATAN) 0.005 % ophthalmic solution 1 drop, Daily   metFORMIN  (GLUCOPHAGE -XR) 1,000 mg, Oral, Daily with breakfast     Physical Exam:  Vitals:   09/01/23 1012 09/01/23 1016  BP: (!) 169/71 (!) 162/72  Pulse: 70 79  Temp: 97.7 F (36.5 C)   TempSrc: Oral   SpO2: 99%   Weight: 142 lb 3.2 oz (64.5 kg)   Height: 5' 1 (1.549 m)     Physical Exam Constitutional:      General: She is not in acute distress.    Appearance: She is not ill-appearing.  Eyes:     Extraocular Movements: Extraocular movements intact.  Cardiovascular:     Rate and Rhythm: Normal rate and regular rhythm.  Pulmonary:     Effort: No respiratory  distress.  Abdominal:     General: Abdomen is flat. There is no distension.     Comments: Mildly tender right upper and right lower quadrant  Musculoskeletal:     Right lower leg: No edema.     Left lower leg: No edema.     Comments: No pain with logroll.  No tenderness over the greater trochanteric bursa.  Skin:    Comments: Patch of 4 cm x 8 cm of redness over the right hip underwear she wears her belt.  Neurological:     General: No focal deficit present.     Mental Status: She is alert.     Motor: No weakness.       Assessment & Plan:   Assessment & Plan Meralgia paresthetica of right side Acute concern today of right hip pain with radiation into the right groin.  She noticed the pain on Tuesday of last week after wearing a heavy work belt at her job at [employer].  She noticed some redness under the area where she had her heavy flashlight.  Her pain is described as a stabbing pain.  She has been taking ibuprofen  200 mg every 3 hours with some benefit.  She has no associated urinary symptoms.  She had some constipation 2 days ago but this is resolved.  Her pain has gotten somewhat better over time. On  exam, she has no numbness.  She does have a red patchy area under where her work belt normally sits. Her symptoms are consistent with meralgia paresthetica.  First-line agents for this would be gabapentin  or Cymbalta , however the patient declined this gabapentin  due to side effects.  She was previously prescribed Cymbalta  but did not take it because she heard it was an antipsychotic.  She was educated that Cymbalta  was not an antipsychotic and that we prescribe it often times for neuropathic pain, however she declined this medication.  Since she has had some pain control with ibuprofen , we will prescribe her a long-acting anti-inflammatory for her pain.  She has had stomach upset with meloxicam  in the past so we will try Celebrex  100 mg once daily for 14 days.  She was advised  that her pain should get better with time and with medications.  - Prescribed Celebrex  100 mg once daily for 14 days -Can take Tylenol  650 Q8 as well Type 2 diabetes mellitus with hyperglycemia, without long-term current use of insulin (HCC) Seen by Dr. Norrine on 7/28 at which time her A1c was 11.3.  Previous A1c 9.7 in April of last year.  She believes that this is due to eating more grab and go foods.  She is currently taking metformin  1000 mg daily and reports good adherence to this.  After extensive counseling on medications, she declined any further medication management as she believes that she will be able to lower her A1c with lifestyle changes alone.  She was given advice about lifestyle changes including increased activity, decrease fried foods, increasing her vegetable and fruit intake, and eating more fiber.  She was advised that if her A1c does not improve in 3 months that she should start an additional medication to her metformin , and that ideally she should start an additional agent today.  -Patient declined medication management -Lifestyle modifications as above - Follow-up in 3 months for A1c Essential hypertension BP today 162/72.  Has been elevated at several visits in the past.  She has been having headaches on and off for the past month.  She does not want to take any blood pressure medication at this time.  She was advised about the effects of long-term hypertension on her kidneys, heart, and brain.  She was advised that her headaches may be stemming from her hypertension.  She once again declined hypertensive management as she believes these medications are unsafe.  She was counseled on the safety of these medications.  Of note, patient did not want to have her blood pressure checked a third time after the visit.  She is advised that she should strongly consider starting antihypertensive therapy at a follow-up visit.  -Advised medication management, patient declined - Encourage  lifestyle changes and initiation of antihypertensive medications at the following visit.  No orders of the defined types were placed in this encounter.  Follow-up in 3 months for chronic conditions.  Patient seen with Dr. Ronnald Karna Melvenia Napoleon, MD [facility] Internal Medicine Resident PGY-1 Clinic Phone: [phone] Please contact the on call pager at [phone] for any urgent or emergent needs.

## 2023-09-01 NOTE — Assessment & Plan Note (Signed)
 BP today 162/72.  Has been elevated at several visits in the past.  She has been having headaches on and off for the past month.  She does not want to take any blood pressure medication at this time.  She was advised about the effects of long-term hypertension on her kidneys, heart, and brain.  She was advised that her headaches may be stemming from her hypertension.  She once again declined hypertensive management as she believes these medications are unsafe.  She was counseled on the safety of these medications.  Of note, patient did not want to have her blood pressure checked a third time after the visit.  She is advised that she should strongly consider starting antihypertensive therapy at a follow-up visit.  -Advised medication management, patient declined - Encourage lifestyle changes and initiation of antihypertensive medications at the following visit.

## 2023-09-04 ENCOUNTER — Ambulatory Visit: Payer: Self-pay

## 2023-09-04 ENCOUNTER — Ambulatory Visit

## 2023-09-04 VITALS — BP 184/77 | HR 78 | Temp 98.1°F | Ht 61.0 in | Wt 141.4 lb

## 2023-09-04 DIAGNOSIS — M1712 Unilateral primary osteoarthritis, left knee: Secondary | ICD-10-CM

## 2023-09-04 DIAGNOSIS — I10 Essential (primary) hypertension: Secondary | ICD-10-CM

## 2023-09-04 DIAGNOSIS — R21 Rash and other nonspecific skin eruption: Secondary | ICD-10-CM

## 2023-09-04 DIAGNOSIS — M25561 Pain in right knee: Secondary | ICD-10-CM | POA: Diagnosis not present

## 2023-09-04 NOTE — Progress Notes (Signed)
 CC: Acute Concern of right knee pain  HPI:  Erin Lawson is a 63 y.o. female with pertinent PMH of DMII, HTN who presents for right knee pain and rash. Please see problem based assessment and plan for further history.  Review of Systems  Constitutional:  Negative for chills and fever.  Gastrointestinal:  Positive for nausea. Negative for abdominal pain, heartburn and vomiting.    Medications: Current Outpatient Medications  Medication Instructions   Accu-Chek Softclix Lancets lancets Check blood sugar 3 times a day   acetaminophen  (TYLENOL  8 HOUR) 650 mg, Oral, Every 8 hours PRN   blood glucose meter kit and supplies Dispense based on patient and insurance preference. Use up to four times daily as directed. (FOR ICD-10 E10.9, E11.9).   celecoxib  (CELEBREX ) 100 mg, Oral, Daily   cetirizine  (ZYRTEC  ALLERGY) 10 mg, Oral, Daily   Continuous Blood Gluc Sensor (FREESTYLE LIBRE 2 SENSOR) MISC Use to check blood sugar at least 6 times a day before and 2 hours after meals   cyclobenzaprine  (FLEXERIL ) 10 mg, Oral, Daily at bedtime   diclofenac  Sodium (VOLTAREN ) 4 g, Topical, 2 times daily PRN   glucose blood (ACCU-CHEK GUIDE) test strip Check blood sugar 3 times per day   latanoprost (XALATAN) 0.005 % ophthalmic solution 1 drop, Daily   metFORMIN  (GLUCOPHAGE -XR) 1,000 mg, Oral, Daily with breakfast     Physical Exam:  Vitals:   09/04/23 1404 09/04/23 1420  BP: (!) 184/78 (!) 184/77  Pulse: 83 78  Temp: 98.1 F (36.7 C)   TempSrc: Oral   SpO2: 98%   Weight: 141 lb 6.4 oz (64.1 kg)   Height: 5' 1 (1.549 m)     Physical Exam Constitutional:      General: She is not in acute distress.    Appearance: She is not ill-appearing.  Cardiovascular:     Rate and Rhythm: Normal rate and regular rhythm.  Pulmonary:     Effort: No respiratory distress.  Musculoskeletal:     Left lower leg: No edema.     Comments: Right knee with mild swelling and effusion. No erythema or  warmth.  Passive range of motion is 10 degrees to 120 degrees.  Tender along the medial and lateral joint lines as well as posterior knee.  Negative Lachman's. negative Homans. negative McMurray's.  Negative varus and valgus stress test.  5 out of 5 strength in the right knee  Left knee with no swelling or effusion.  No erythema or warmth. Passive range of motion 0 to 120 degrees.  Nontender  Antalgic gait  Skin:    Comments: Small raised erythematous rash of the epigastric region picture included  Neurological:     Mental Status: She is alert.       Media Information   Document Information  Photos  Rash  09/04/2023 14:33  Attached To:  Office Visit on 09/04/23 with Napoleon Limes, MD  Source Information  Napoleon Limes, MD  [facility]    Assessment & Plan:   Assessment & Plan Acute pain of right knee The patient was last seen on 8/18 with an acute concern of right hip pain and was prescribed Celebrex  at that time. The next day, she noticed the above rash as well as swelling of the right knee.  She had no trauma or current injury to the area.  She stopped taking the Celebrex  and rubbed apple cider vinegar on her right knee to try to reduce the swelling. She states  that the swelling was much worse originally, but has improved slightly. She has been trying to manage her pain with Tylenol  and Ibuprofen  with some benefit. She feels that she can not bear much weight on her right foot as she is scared that it will give out on her. She has not tried any ice or heat. Her pain is relieved by sitting down and not walking. Her pain has caused her to have diminished appetite. On physical exam, there is mild swelling of the knee with tenderness around the medial and lateral joint lines. There is decreased range of motion compared to the left knee. There are no signs ligamentous instability. No warmth or erythema to suggest infection or gout.  She has not had imaging of her right  knee but she has had left knee x-rays that show osteoarthritis.  I think it is possible that she had some microtrauma causing an osteoarthritis flare to the right knee that caused her swelling.  If her x-ray did not show any osteoarthritis, would consider referral to sports medicine.  -Right knee x-ray.  If normal would refer to sports medicine if patient is having continued symptoms - Continue to manage symptoms with over-the-counter Tylenol  up to 3000 mg daily and ibuprofen  up to 800 mg 3 times daily. Rash She has a small rash over her epigastric region that has been pruritic.  She notes that the rash has gotten worse that she scratched it.  On exam there is a small erythematous raised lesion over her epigastric region.  Is only been present for 2 days.  It is not very bothersome to the patient she mentioned it only because she thinks it was a reaction from the Celebrex .  She does not request any medical management at this time. Essential hypertension Her blood pressure today was 184/77.  She showed me a picture of her home blood pressure which was in the 160s systolic.  She thinks that her blood pressure is higher now because of pain.  We have had multiple discussions with her in the past about starting medications for her hypertension but she has declined, opting for lifestyle modification.  -Follow-up in 3 months to discuss her chronic care conditions.  No orders of the defined types were placed in this encounter.    Patient seen with Dr. Ronnald Karna Melvenia Napoleon, MD [facility] Internal Medicine Resident PGY-1 Clinic Phone: [phone] Please contact the on call pager at [phone] for any urgent or emergent needs.

## 2023-09-04 NOTE — Telephone Encounter (Signed)
 Patient is scheduled to se Dr.Smucker this afternoon.

## 2023-09-04 NOTE — Assessment & Plan Note (Signed)
 Her blood pressure today was 184/77.  She showed me a picture of her home blood pressure which was in the 160s systolic.  She thinks that her blood pressure is higher now because of pain.  We have had multiple discussions with her in the past about starting medications for her hypertension but she has declined, opting for lifestyle modification.  -Follow-up in 3 months to discuss her chronic care conditions.

## 2023-09-04 NOTE — Addendum Note (Signed)
 Addended by: KARNA FELLOWS on: 09/04/2023 08:57 AM   Modules accepted: Level of Service

## 2023-09-04 NOTE — Telephone Encounter (Signed)
 FYI Only or Action Required?: Action required by provider: request for appointment.  Patient was last seen in primary care on 09/01/2023 by Napoleon Limes, MD.  Called Nurse Triage reporting Medication Reaction.  Symptoms began several days ago.  Interventions attempted: Nothing.  Symptoms are: unchanged. States after starting Celebrex  had swelling to right leg and rash to abdomen. Warm transfer to [facility] in the practice.  Triage Disposition: See PCP When Office is Open (Within 3 Days)  Patient/caregiver understands and will follow disposition?:    Copied from CRM #[id number]. Topic: Clinical - Red Word Triage >> Sep 04, 2023  9:10 AM Farrel B wrote: [facility] that prompted transfer to Nurse Triage: rash on stomach, swelling right knee unable to put pressure on leg unable to walk having to try and walk on tipy toes, severe headache after starting new medication Reason for Disposition  Prescription request for new medicine (not a refill)  Answer Assessment - Initial Assessment Questions 1. NAME of MEDICINE: What medicine(s) are you calling about?     Celebrex  2. QUESTION: What is your question? (e.g., double dose of medicine, side effect)     Side effect 3. PRESCRIBER: Who prescribed the medicine? Reason: if prescribed by specialist, call should be referred to that group.     PCP 4. SYMPTOMS: Do you have any symptoms? If Yes, ask: What symptoms are you having?  How bad are the symptoms (e.g., mild, moderate, severe)     Swelling to right leg, rash to stomach 5. PREGNANCY:  Is there any chance that you are pregnant? When was your last menstrual period?     no  Protocols used: Medication Question Call-A-AH

## 2023-09-04 NOTE — Progress Notes (Signed)
 Internal Medicine Clinic Attending  I was physically present during the key portions of the resident provided service and participated in the medical decision making of patient's management care. I reviewed pertinent patient test results.  The assessment, diagnosis, and plan were formulated together and I agree with the documentation in the resident's note.  Dickie La, MD

## 2023-09-04 NOTE — Patient Instructions (Signed)
 Thank you, [name], for allowing us  to provide your care today. Today we discussed . . .  > Right knee pain       - We would like to get an x-ray of your right knee to see if you have any arthritis. Keep taking the Ibuprofen  and Tylenol  for your right knee pain.    Follow up: in 3 months for your hypertension and diabetes    Remember:  Should you have any questions or concerns please call the internal medicine clinic at [phone].     [name], [facility]

## 2023-09-04 NOTE — Assessment & Plan Note (Signed)
 The patient was last seen on 8/18 with an acute concern of right hip pain and was prescribed Celebrex  at that time. The next day, she noticed the above rash as well as swelling of the right knee.  She had no trauma or current injury to the area.  She stopped taking the Celebrex  and rubbed apple cider vinegar on her right knee to try to reduce the swelling. She states that the swelling was much worse originally, but has improved slightly. She has been trying to manage her pain with Tylenol  and Ibuprofen  with some benefit. She feels that she can not bear much weight on her right foot as she is scared that it will give out on her. She has not tried any ice or heat. Her pain is relieved by sitting down and not walking. Her pain has caused her to have diminished appetite. On physical exam, there is mild swelling of the knee with tenderness around the medial and lateral joint lines. There is decreased range of motion compared to the left knee. There are no signs ligamentous instability. No warmth or erythema to suggest infection or gout.  She has not had imaging of her right knee but she has had left knee x-rays that show osteoarthritis.  I think it is possible that she had some microtrauma causing an osteoarthritis flare to the right knee that caused her swelling.  If her x-ray did not show any osteoarthritis, would consider referral to sports medicine.  -Right knee x-ray.  If normal would refer to sports medicine if patient is having continued symptoms - Continue to manage symptoms with over-the-counter Tylenol  up to 3000 mg daily and ibuprofen  up to 800 mg 3 times daily.

## 2023-09-05 ENCOUNTER — Ambulatory Visit
Admission: RE | Admit: 2023-09-05 | Discharge: 2023-09-05 | Disposition: A | Discharge status: Home / Self Care | Source: Ambulatory Visit | Attending: Internal Medicine | Admitting: Internal Medicine

## 2023-09-05 DIAGNOSIS — M25561 Pain in right knee: Secondary | ICD-10-CM

## 2023-09-08 ENCOUNTER — Telehealth: Payer: Self-pay | Admitting: *Deleted

## 2023-09-08 ENCOUNTER — Ambulatory Visit: Payer: Self-pay

## 2023-09-08 NOTE — Telephone Encounter (Signed)
 Pt is now requesting a referral to sports medicine after her X-ray completed on 09/05/2023.  Please advise will a Sports medicine be placed per LOV notes from 09/04/2023 if needed.  Copied from CRM #[id number]. Topic: Referral - Request for Referral >> Sep 05, 2023  2:22 PM Fredrica W wrote: Did the patient discuss referral with their provider in the last year? Yes (If No - schedule appointment) (If Yes - send message)  Appointment offered? No  Type of order/referral and detailed reason for visit: Seen yesterday - xray today - requesting referral Ortho or sports medicine   Preference of office, provider, location: N/A  If referral order, have you been seen by this specialty before? No (If Yes, this issue or another issue? When? Where?  Can we respond through MyChart? No

## 2023-09-08 NOTE — Progress Notes (Signed)
 Internal Medicine Clinic Attending  I was physically present during the key portions of the resident provided service and participated in the medical decision making of patient's management care. I reviewed pertinent patient test results.  The assessment, diagnosis, and plan were formulated together and I agree with the documentation in the resident's note.  Dickie La, MD

## 2023-09-08 NOTE — Telephone Encounter (Unsigned)
 Copied from CRM #[id number]. Topic: Clinical - Lab/Test Results >> Sep 08, 2023  3:27 PM Graeme ORN wrote: Reason for CRM: Patient called. Request to speak to nurse. States My Chart says xray results are in but no one has reached out to her. She would like to know the outcome of results and next steps. Supposed to return to work Wednesday - would like to know if she needs to go back as light duty only. Thank You

## 2023-09-08 NOTE — Progress Notes (Signed)
 X-ray showed mild-to-moderate degenerative changes. If patient is still having pain, would recommend intra-articular corticosteroid injection with our clinic vs referral to sports medicine or ortho based on the patient's preference.

## 2023-09-09 NOTE — Telephone Encounter (Addendum)
 Received return call from pt [facility] had attempted to contact her with xray results Per Dr Nalani note, CMA informed pt that she has arthritis, but she wanted further details of imaging results below.  Pt was given work excuse note during last visit with a return date of 8/27.   Pt now requesting a work note for light duty and would like to pick up today. Pt does not want any mention of knee pain on letter. Dr Napoleon on nights, will send to team for letter.   Narrative & Impression  CLINICAL DATA:  Posterolateral RIGHT knee pain with swelling for 1 week.   EXAM: RIGHT KNEE - COMPLETE 4+ VIEW   COMPARISON:  04/01/2022   FINDINGS: Moderate joint space loss in the lateral compartment. Mild spurring of the patellofemoral and medial compartments. No acute osseous abnormality. Soft tissues are unremarkable.   IMPRESSION: Mild-to-moderate degenerative changes of the RIGHT knee. No acute abnormality.     Electronically Signed   By: Aliene Lloyd M.D.   On: 09/05/2023 08:39

## 2023-09-09 NOTE — Telephone Encounter (Signed)
 Pt calling back to check on the status of letter CMA explained that MD has been in clinic all day and [facility] does usually have a 24-48 hr turn around for letter requests. Pt states she can come in the morning to pick up letter on her way to work. CMA not sure if letter will be ready by the 9am time pt would like to come by.

## 2023-09-10 NOTE — Addendum Note (Signed)
 Addended by: TOBIE GAINES on: 09/10/2023 09:06 PM   Modules accepted: Orders

## 2023-09-17 ENCOUNTER — Ambulatory Visit: Admitting: Internal Medicine

## 2023-09-17 ENCOUNTER — Encounter: Payer: Self-pay | Admitting: Internal Medicine

## 2023-09-17 VITALS — BP 134/86 | Ht 61.0 in | Wt 140.0 lb

## 2023-09-17 DIAGNOSIS — M1711 Unilateral primary osteoarthritis, right knee: Secondary | ICD-10-CM

## 2023-09-17 DIAGNOSIS — M25561 Pain in right knee: Secondary | ICD-10-CM | POA: Diagnosis not present

## 2023-09-17 NOTE — Progress Notes (Cosign Needed)
 PCP: Tobie Gaines, DO  Subjective:   HPI: Patient is a 63 y.o. female here for right anterior knee pain.  She reports onset of right anterior knee pain roughly 2 weeks ago.  There was no specific injury or trauma that she can recall, however she says that she fell on both knees 2 months ago. She describes pain along the medial and lateral joint lines with mild effusion.  She was initially prescribed Celebrex  but quickly developed a rash along her torso so she stopped taking it and started applying apple cider vinegar.  X-rays of the right knee were obtained by [facility] and showed mild-moderate degenerative changes of the right knee, most notably at the medial and patellofemoral compartments.  She was referred to sports medicine for further evaluation and management.  Today Ms. Keleher says that her right knee pain has significantly improved.  She is able to ambulate without significant discomfort.  She has been working on [employer] duty but will return to normal duty tomorrow.  She is not taking any medication for pain relief.  She additionally endorses mild discomfort in her right lateral hip and right lumbar region of her back.  Past Medical History:  Diagnosis Date   Anemia    Anxiety    At increased risk for cardiovascular disease 08/29/2020   Breast cancer (HCC)    Cancer (HCC) 10/2018   left breast IDC   Chest pain 05/18/2012   DM type 2 (diabetes mellitus, type 2) (HCC) 05/18/2012   GERD (gastroesophageal reflux disease)    OTC prn   Hypertension    Pain in paraspinal region 03/30/2021   Personal history of chemotherapy    Personal history of radiation therapy     Current Outpatient Medications on File Prior to Visit  Medication Sig Dispense Refill   Accu-Chek Softclix Lancets lancets Check blood sugar 3 times a day 100 each 11   acetaminophen  (TYLENOL  8 HOUR) 650 MG CR tablet Take 1 tablet (650 mg total) by mouth every 8 (eight) hours as needed for pain. 15 tablet 0   blood glucose meter kit  and supplies Dispense based on patient and insurance preference. Use up to four times daily as directed. (FOR ICD-10 E10.9, E11.9). 1 each 0   cetirizine  (ZYRTEC  ALLERGY) 10 MG tablet Take 1 tablet (10 mg total) by mouth daily. 90 tablet 0   Continuous Blood Gluc Sensor (FREESTYLE LIBRE 2 SENSOR) MISC Use to check blood sugar at least 6 times a day before and 2 hours after meals 2 each 11   cyclobenzaprine  (FLEXERIL ) 10 MG tablet Take 1 tablet (10 mg total) by mouth at bedtime. 14 tablet 0   diclofenac  Sodium (VOLTAREN ) 1 % GEL Apply 4 g topically 2 (two) times daily as needed. 4 g 0   glucose blood (ACCU-CHEK GUIDE) test strip Check blood sugar 3 times per day 100 each 11   latanoprost (XALATAN) 0.005 % ophthalmic solution Place 1 drop into both eyes daily.     metFORMIN  (GLUCOPHAGE -XR) 500 MG 24 hr tablet Take 2 tablets (1,000 mg total) by mouth daily with breakfast. 180 tablet 3   No current facility-administered medications on file prior to visit.    Past Surgical History:  Procedure Laterality Date   BREAST LUMPECTOMY WITH RADIOACTIVE SEED AND SENTINEL LYMPH NODE BIOPSY Left 04/21/2019   Procedure: LEFT BREAST LUMPECTOMY WITH RADIOACTIVE SEED AND SENTINEL LYMPH NODE MAPPING;  Surgeon: Vanderbilt Ned, MD;  Location: [facility];  Service: General;  Laterality:  Left;   CESAREAN SECTION     PORTACATH PLACEMENT Right 11/11/2018   Procedure: INSERTION PORT-A-CATH WITH ULTRASOUND;  Surgeon: Vanderbilt Ned, MD;  Location: [facility];  Service: General;  Laterality: Right;   utreine polyp     resected    Allergies  Allergen Reactions   Atenolol Other (See Comments)    REACTION: made me feel bad REACTION: made me feel bad   Gabapentin  Other (See Comments)   Poractant Alfa Other (See Comments)    Migraine    Poractant Alfa Other (See Comments)    Other Reaction(s): Other (See Comments)  Migraine   Migraine   Migraine   Pork Allergy Other (See Comments)     Migraine   Migraine   Pork-Derived Products Other (See Comments)    Migraine    Meloxicam  Rash    BP 134/86   Ht 5' 1 (1.549 m)   Wt 140 lb (63.5 kg)   BMI 26.45 kg/m       No data to display              No data to display              Objective:  Physical Exam:  Gen: NAD, comfortable in exam room  Right Knee No gross deformity, ecchymoses, swelling. No TTP FROM with normal strength, she endorses mild discomfort along the posterior aspect of the knee with full extension Negative ant/post drawers. Negative valgus/varus testing. Negative lachman.  Negative mcmurrays, apleys.  NV intact distally.    Assessment & Plan:  1.  Acute right knee pain Evaluated today for right knee pain that developed within the last 2-3 weeks.  X-rays showed osteoarthritis most notable at the medial and patellofemoral compartments.  Fortunately, knee pain has largely resolved since the time of her referral.  I believe her pain was due to an arthritic flare.  We discussed strategies to prevent future flares, including home PT, use of a compression knee sleeve while working, NSAIDs/icing as needed.  Patient will follow-up as needed if pain returns. -Home PT -NSAIDs/icing -Compression knee sleeve -Follow up as needed

## 2023-09-17 NOTE — Patient Instructions (Signed)
 The x-rays of your right knee showed arthritis.  Fortunately, your pain has improved.  To prevent future flares of pain and to help with strengthening of the muscles surrounding the knee joint, I recommend home physical therapy.  Please review the exercises you were given today.  Return to care if pain returns or the pain in your lower back and right hip does not improve.

## 2023-09-18 ENCOUNTER — Encounter: Payer: Self-pay | Admitting: Internal Medicine

## 2023-11-11 ENCOUNTER — Ambulatory Visit: Admitting: Student

## 2023-12-08 ENCOUNTER — Ambulatory Visit (INDEPENDENT_AMBULATORY_CARE_PROVIDER_SITE_OTHER): Admitting: Student

## 2023-12-08 VITALS — BP 176/78 | HR 80 | Ht 61.0 in | Wt 138.6 lb

## 2023-12-08 DIAGNOSIS — E1165 Type 2 diabetes mellitus with hyperglycemia: Secondary | ICD-10-CM

## 2023-12-08 DIAGNOSIS — B029 Zoster without complications: Secondary | ICD-10-CM | POA: Insufficient documentation

## 2023-12-08 DIAGNOSIS — B0229 Other postherpetic nervous system involvement: Secondary | ICD-10-CM

## 2023-12-08 DIAGNOSIS — I10 Essential (primary) hypertension: Secondary | ICD-10-CM

## 2023-12-08 LAB — POCT GLYCOSYLATED HEMOGLOBIN (HGB A1C): HbA1c, POC (controlled diabetic range): 10.7 % — AB (ref 0.0–7.0)

## 2023-12-08 LAB — GLUCOSE, CAPILLARY: Glucose-Capillary: 296 mg/dL — ABNORMAL HIGH (ref 70–99)

## 2023-12-08 MED ORDER — VALACYCLOVIR HCL 1 G PO TABS: 2000.0000 mg | ORAL_TABLET | Freq: Three times a day (TID) | ORAL | 0 refills | Status: DC

## 2023-12-08 MED ORDER — VALACYCLOVIR HCL 1 G PO TABS: 1000.0000 mg | ORAL_TABLET | Freq: Three times a day (TID) | ORAL | 0 refills | Status: AC

## 2023-12-08 NOTE — Assessment & Plan Note (Addendum)
 Erin Lawson presents with concerns about a recurrent lesion on her back. She first noticed the lesion in March, accompanied by sharp, needle-like back pain, and believed she had been bitten by an insect at that time; although the lesion resolved after a few months, the pain persisted. She was evaluated by Neurology and had a total spine MRI, which was unremarkable. Today, she reports that the lesion has returned along with worsening back pain rated 10/10, localized to the area of the lesion. On exam, there is a vesicular rash on the back that has not yet formed grouped clear vesicles but does appear on an erythematous base, with no pustules or crusting observed; the area is hypersensitive and painful to touch. I am concerned for shingles. She reports a history of a similar episode in the past, though prior providers told her it was not shingles, and she has never received treatment nor the shingles vaccine. After counseling on diagnosis and treatment options, she agrees to start valacyclovir  for 7 days, and a referral to Dermatology is placed for further evaluation. - Start Valacyclovir   1000 mg TID - Referral to Dermatology

## 2023-12-08 NOTE — Patient Instructions (Addendum)
 Thank you, Ms.Shizue Liala Codispoti for allowing us  to provide your care today. Today we discussed your back lesion.    I have prescribed you medication for shingles as discussed.  Take 1000 mg 3 times daily for the next 7 days.  I have sent a referral to dermatology.  There would be given you a call to schedule an appointment   I have ordered the following labs for you:  Lab Orders         Glucose, capillary         POC Hbg A1C      Tests ordered today:    Referrals ordered today:   Referral Orders         Ambulatory referral to Dermatology       I have ordered the following medication/changed the following medications:   Stop the following medications: There are no discontinued medications.   Start the following medications: Meds ordered this encounter  Medications   valACYclovir  (VALTREX ) 1000 MG tablet    Sig: Take 2 tablets (2,000 mg total) by mouth 3 (three) times daily for 7 days.    Dispense:  21 tablet    Refill:  0     Follow up: 3 months   Remember:   Should you have any questions or concerns please call the internal medicine clinic at [phone].   Drue Lisa Grow MD 12/08/2023, 5:54 PM   [facility]

## 2023-12-08 NOTE — Progress Notes (Signed)
 CC: Rash at my back  HPI:  [name] is a 63 y.o. female living with a history stated below and presents today for lesion on her back. Please see problem based assessment and plan for additional details.  Past Medical History:  Diagnosis Date   Anemia    Anxiety    At increased risk for cardiovascular disease 08/29/2020   Breast cancer (HCC)    Cancer (HCC) 10/2018   left breast IDC   Chest pain 05/18/2012   DM type 2 (diabetes mellitus, type 2) (HCC) 05/18/2012   GERD (gastroesophageal reflux disease)    OTC prn   Hypertension    Pain in paraspinal region 03/30/2021   Personal history of chemotherapy    Personal history of radiation therapy     Current Outpatient Medications on File Prior to Visit  Medication Sig Dispense Refill   Accu-Chek Softclix Lancets lancets Check blood sugar 3 times a day 100 each 11   acetaminophen  (TYLENOL  8 HOUR) 650 MG CR tablet Take 1 tablet (650 mg total) by mouth every 8 (eight) hours as needed for pain. 15 tablet 0   blood glucose meter kit and supplies Dispense based on patient and insurance preference. Use up to four times daily as directed. (FOR ICD-10 E10.9, E11.9). 1 each 0   cetirizine  (ZYRTEC  ALLERGY) 10 MG tablet Take 1 tablet (10 mg total) by mouth daily. 90 tablet 0   Continuous Blood Gluc Sensor (FREESTYLE LIBRE 2 SENSOR) MISC Use to check blood sugar at least 6 times a day before and 2 hours after meals 2 each 11   cyclobenzaprine  (FLEXERIL ) 10 MG tablet Take 1 tablet (10 mg total) by mouth at bedtime. 14 tablet 0   diclofenac  Sodium (VOLTAREN ) 1 % GEL Apply 4 g topically 2 (two) times daily as needed. 4 g 0   glucose blood (ACCU-CHEK GUIDE) test strip Check blood sugar 3 times per day 100 each 11   latanoprost (XALATAN) 0.005 % ophthalmic solution Place 1 drop into both eyes daily.     metFORMIN  (GLUCOPHAGE -XR) 500 MG 24 hr tablet Take 2 tablets (1,000 mg total) by mouth daily with breakfast. 180 tablet 3   No  current facility-administered medications on file prior to visit.    Family History  Problem Relation Age of Onset   Healthy Mother    Hypertension Sister    Heart attack Neg Hx     Social History   Socioeconomic History   Marital status: Single    Spouse name: Not on file   Number of children: Not on file   Years of education: Not on file   Highest education level: Not on file  Occupational History   Occupation: cna    Comment: [facility] [employer]   Occupation: biochemist, clinical  Tobacco Use   Smoking status: Never   Smokeless tobacco: Never  Vaping Use   Vaping status: Not on file  Substance and Sexual Activity   Alcohol use: No   Drug use: No   Sexual activity: Not Currently    Birth control/protection: Post-menopausal  Other Topics Concern   Not on file  Social History Narrative   Marital status: single; not dating      Children: twins in 30; 3 grandchildren/boys      Lives: with friend, son      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 Left Handed    Are you currently employed ? 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 No    Who lives with you? Son and Friend   What type of home do you live in: 1 story or 2 story? Lives in a one story. Entry way has one flight stairs       Social Drivers of Corporate Investment Banker Strain: Not on file  Food Insecurity: Not on file  Transportation Needs: Not on file  Physical Activity: Not on file  Stress: Not on file  Social Connections: Not on file  Intimate Partner Violence: Not on file    Review of Systems: ROS negative except for what is noted on the assessment and plan.  Vitals:   12/08/23 1340 12/08/23 1434 12/08/23 1435  BP: (!) 184/96 (!) 169/77 (!) 176/78  Pulse: 69 81 80   Weight: 138 lb 9.6 oz (62.9 kg)    Height: 5' 1 (1.549 m)      Physical Exam: Constitutional: Well-appearing woman with freckles on face and back Cardiovascular: regular rate and rhythm, no m/r/g Pulmonary/Chest: normal work of breathing on room air, lungs clear to auscultation bilaterally Abdominal: soft, non-tender, non-distended MSK: normal bulk and tone  Neurological: alert & oriented x 3, no focal deficit Skin: warm and dry Psych: normal mood and behavior  Assessment & Plan:   Shingles [name] presents with concerns about a recurrent lesion on her back. She first noticed the lesion in March, accompanied by sharp, needle-like back pain, and believed she had been bitten by an insect at that time; although the lesion resolved after a few months, the pain persisted. She was evaluated by Neurology and had a total spine MRI, which was unremarkable. Today, she reports that the lesion has returned along with worsening back pain rated 10/10, localized to the area of the lesion. On exam, there is a vesicular rash on the back that has not yet formed grouped clear vesicles but does appear on an erythematous base, with no pustules or crusting observed; the area is hypersensitive and painful to touch. I am concerned for shingles. She reports a history of a similar episode in the past, though prior providers told her it was not shingles, and she has never received treatment nor the shingles vaccine. After counseling on diagnosis and treatment options, she agrees to start valacyclovir  for 7 days, and a referral to Dermatology is placed for further evaluation. - Start Valacyclovir   1000 mg TID - Referral to Dermatology   Essential hypertension BP Readings from Last 3 Encounters:  12/08/23 (!) 176/78  09/17/23 134/86  09/04/23 (!) 184/77  Blood pressure is above goal in the office today, but the patient declines medical therapy, stating she is using a holistic approach to manage her blood  pressure. When asked for more details, she stated she is using her own "best-known ways" without further clarification. Will plan to revisit this discussion at her next visit.    Patient discussed with Dr. Trudy Drue Grow, M.D [facility] Phone: [phone] Date 12/08/2023 Time 6:02 PM

## 2023-12-08 NOTE — Assessment & Plan Note (Signed)
 BP Readings from Last 3 Encounters:  12/08/23 (!) 176/78  09/17/23 134/86  09/04/23 (!) 184/77  Blood pressure is above goal in the office today, but the patient declines medical therapy, stating she is using a holistic approach to manage her blood pressure. When asked for more details, she stated she is using her own "best-known ways" without further clarification. Will plan to revisit this discussion at her next visit.

## 2023-12-25 NOTE — Progress Notes (Signed)
 Internal Medicine Clinic Attending  I was physically present during the key portions of the resident provided service and participated in the medical decision making of patient's management care. (Resident note indicates we discussed, but in fact I examined patient).  I reviewed pertinent patient test results.  The assessment, diagnosis, and plan were formulated together and I agree with the documentation in the resident's note.  Trudy Mliss Dragon, MD

## 2024-02-20 ENCOUNTER — Encounter: Payer: Self-pay | Admitting: Student

## 2024-02-20 ENCOUNTER — Ambulatory Visit: Payer: Self-pay | Admitting: Student

## 2024-02-20 VITALS — BP 187/90 | HR 72 | Temp 98.0°F | Ht 61.0 in | Wt 138.6 lb

## 2024-02-20 DIAGNOSIS — J3089 Other allergic rhinitis: Secondary | ICD-10-CM | POA: Insufficient documentation

## 2024-02-20 DIAGNOSIS — Z1211 Encounter for screening for malignant neoplasm of colon: Secondary | ICD-10-CM

## 2024-02-20 DIAGNOSIS — I10 Essential (primary) hypertension: Secondary | ICD-10-CM

## 2024-02-20 DIAGNOSIS — E1100 Type 2 diabetes mellitus with hyperosmolarity without nonketotic hyperglycemic-hyperosmolar coma (NKHHC): Secondary | ICD-10-CM

## 2024-02-20 MED ORDER — FLUTICASONE PROPIONATE 50 MCG/ACT NA SUSP: 1.0000 | Freq: Every day | NASAL | 3 refills | Status: AC

## 2024-02-20 NOTE — Patient Instructions (Addendum)
 [facility], Thank you for allowing me to take part in your care today.  Here are your instructions.  1. Your diabetes and your blood pressure are not well controlled. If you decide you want more medication please let us  know.  2. I am going to check your stool to see if you need to go for colonoscopy   3. I am going to check your urine today  4. Start taking flonase    5. If you develop dental pain please call your dentist    PLEASE BRING YOUR MEDICATIONS TO EVERY APPOINTMENT  Thank you, Dr. Tobie  If you have any other questions please contact the internal medicine clinic at [phone] If it is after hours, please call the [facility] at [phone] and then ask the person who picks up for the resident on call.

## 2024-02-20 NOTE — Assessment & Plan Note (Addendum)
 Patient with concerns of postnasal drip as well as clear sinus drainage.  She also endorses tearful eyes.  This is likely in the setting of allergic rhinitis.  No fever or chills.  On exam patient did have swollen turbinates.  Plan: - Nasal Flonase  prescribed 1 spray daily each nare

## 2024-02-20 NOTE — Assessment & Plan Note (Addendum)
 Patient has a past medical history of type 2 diabetes mellitus.  Current medications include metformin  1000 mg daily.  Patient does not check her sugars at home.  Previous A1c on December 08, 2023 was 10.7.  This is not at goal.  Discussed medication options with patient today. Patient states that she does not want to do medications for this at this time. She wants to get off medications and do holistic and herbal medications.  She is currently doing sea moss and dandelion root.  She states she would like to ultimately get off metformin  as well.  Discussed the risks of this today.  Plan: - Eye exam updated at [facility] on 10/20/2023 - Obtain urine microalbumin creatinine ratio today - Foot exam updated today - Educated patient on diabetes and long-term effects of diabetes - Encourage patient to inform us  when she is ready to titrate her medications - Educated on lifestyle modification

## 2024-02-20 NOTE — Progress Notes (Signed)
 "  CC: General assessment follow-up  HPI:  [name] is a 64 y.o. female with a past medical history of hypertension, type 2 diabetes, keloids, breast cancer who presents for general assessment.  Please see assessment and plan for full HPI.  Medications: Neuropathy: Voltaren  gel Pain: Tylenol  650 mg prn  Type 2 diabetes: Metformin  1000 mg daily Glaucoma: Latanoprost 1 drop daily  Past Medical History:  Diagnosis Date   Anemia    Anxiety    At increased risk for cardiovascular disease 08/29/2020   Breast cancer (HCC)    Cancer (HCC) 10/2018   left breast IDC   Chest pain 05/18/2012   DM type 2 (diabetes mellitus, type 2) (HCC) 05/18/2012   GERD (gastroesophageal reflux disease)    OTC prn   Hypertension    Pain in paraspinal region 03/30/2021   Personal history of chemotherapy    Personal history of radiation therapy     Current Medications[1]  Review of Systems:    Negative except for what is stated in HPI  Physical Exam:  Vitals:   02/20/24 0837 02/20/24 0847  BP: (!) 195/75 (!) 187/90  Pulse: 72 72  Temp: 98 F (36.7 C)   TempSrc: Oral   SpO2: 97%   Weight: 138 lb 9.6 oz (62.9 kg)   Height: 5' 1 (1.549 m)     General: Patient is sitting comfortably in the room  Head: Normocephalic, atraumatic  ENT: Swollen turbinates appreciated to bilateral nares Cardio: Regular rate and rhythm, no murmurs, rubs or gallops Pulmonary: Clear to ausculation bilaterally with no rales, rhonchi, and crackles  Extremities: Bilateral lower extremities with 2+ pedal pulses.  No signs of ulceration, erythema, or signs of infection.  Sensation intact throughout lower extremities bilaterally    Assessment & Plan:   Assessment & Plan Type 2 diabetes mellitus with hyperosmolarity without coma, without long-term current use of insulin (HCC) Patient has a past medical history of type 2 diabetes mellitus.  Current medications include metformin  1000 mg daily.  Patient does  not check her sugars at home.  Previous A1c on December 08, 2023 was 10.7.  This is not at goal.  Discussed medication options with patient today. Patient states that she does not want to do medications for this at this time. She wants to get off medications and do holistic and herbal medications.  She is currently doing sea moss and dandelion root.  She states she would like to ultimately get off metformin  as well.  Discussed the risks of this today.  Plan: - Eye exam updated at [facility] on 10/20/2023 - Obtain urine microalbumin creatinine ratio today - Foot exam updated today - Educated patient on diabetes and long-term effects of diabetes - Encourage patient to inform us  when she is ready to titrate her medications - Educated on lifestyle modification  Essential hypertension Patient has a past medical history of hypertension.  Blood pressure today 195/75.  On recheck 187/90. Patient had discussion with provider at last visit and patient states that she did not want medicines at that time.  Discussed medication options with patient today regarding antihypertensive medications. Patient does not want to take medications for this and rather take a holistic approach.  Educated patient on hypertension and long-term effects of hypertension.  Patient understood this and stated she will let me know when she would like to start medications.  Plan:  - Patient encouraged to let us  know when she is ready to start antihypertensive medication -  Educated patient on hypertension and long-term effects of hypertension today - Educated on lifestyle modifications Colon cancer screening Discussed colon cancer screening with patient today. She declines colonoscopy. Patient would like to do FIT test.   Plan: - Order for FIT test placed  - Did educate patient that if the test positive, will need to go for colonoscopy Non-seasonal allergic rhinitis, unspecified trigger Patient with concerns of postnasal drip  as well as clear sinus drainage.  She also endorses tearful eyes.  This is likely in the setting of allergic rhinitis.  No fever or chills.  On exam patient did have swollen turbinates.  Plan: - Nasal Flonase  prescribed 1 spray daily each nare   Patient discussed with Dr. Trudy Libby Blanch, DO Internal Medicine Resident PGY-3     [1]  Current Outpatient Medications:    fluticasone  (FLONASE ) 50 MCG/ACT nasal spray, Place 1 spray into both nostrils daily., Disp: 12.9 mL, Rfl: 3   Accu-Chek Softclix Lancets lancets, Check blood sugar 3 times a day, Disp: 100 each, Rfl: 11   acetaminophen  (TYLENOL  8 HOUR) 650 MG CR tablet, Take 1 tablet (650 mg total) by mouth every 8 (eight) hours as needed for pain., Disp: 15 tablet, Rfl: 0   blood glucose meter kit and supplies, Dispense based on patient and insurance preference. Use up to four times daily as directed. (FOR ICD-10 E10.9, E11.9)., Disp: 1 each, Rfl: 0   Continuous Blood Gluc Sensor (FREESTYLE LIBRE 2 SENSOR) MISC, Use to check blood sugar at least 6 times a day before and 2 hours after meals, Disp: 2 each, Rfl: 11   diclofenac  Sodium (VOLTAREN ) 1 % GEL, Apply 4 g topically 2 (two) times daily as needed., Disp: 4 g, Rfl: 0   glucose blood (ACCU-CHEK GUIDE) test strip, Check blood sugar 3 times per day, Disp: 100 each, Rfl: 11   latanoprost (XALATAN) 0.005 % ophthalmic solution, Place 1 drop into both eyes daily., Disp: , Rfl:    metFORMIN  (GLUCOPHAGE -XR) 500 MG 24 hr tablet, Take 2 tablets (1,000 mg total) by mouth daily with breakfast., Disp: 180 tablet, Rfl: 3  "

## 2024-02-20 NOTE — Assessment & Plan Note (Addendum)
 Patient has a past medical history of hypertension.  Blood pressure today 195/75.  On recheck 187/90. Patient had discussion with provider at last visit and patient states that she did not want medicines at that time.  Discussed medication options with patient today regarding antihypertensive medications. Patient does not want to take medications for this and rather take a holistic approach.  Educated patient on hypertension and long-term effects of hypertension.  Patient understood this and stated she will let me know when she would like to start medications.  Plan:  - Patient encouraged to let us  know when she is ready to start antihypertensive medication - Educated patient on hypertension and long-term effects of hypertension today - Educated on lifestyle modifications

## 2024-06-03 ENCOUNTER — Ambulatory Visit: Admitting: Hematology and Oncology

## 2024-08-02 ENCOUNTER — Ambulatory Visit: Admitting: Physician Assistant
